# Patient Record
Sex: Male | Born: 1935 | Race: Black or African American | Hispanic: No | Marital: Married | Smoking: Never smoker
Health system: Southern US, Community
[De-identification: ages and names within clinical notes are randomized; demographics above are authoritative.]

## PROBLEM LIST (undated history)

## (undated) DIAGNOSIS — E118 Type 2 diabetes mellitus with unspecified complications: Secondary | ICD-10-CM

## (undated) DIAGNOSIS — R778 Other specified abnormalities of plasma proteins: Secondary | ICD-10-CM

## (undated) DIAGNOSIS — B9620 Unspecified Escherichia coli [E. coli] as the cause of diseases classified elsewhere: Secondary | ICD-10-CM

## (undated) DIAGNOSIS — Z794 Long term (current) use of insulin: Secondary | ICD-10-CM

## (undated) DIAGNOSIS — I10 Essential (primary) hypertension: Secondary | ICD-10-CM

## (undated) DIAGNOSIS — G629 Polyneuropathy, unspecified: Secondary | ICD-10-CM

## (undated) DIAGNOSIS — R531 Weakness: Secondary | ICD-10-CM

## (undated) DIAGNOSIS — F0390 Unspecified dementia without behavioral disturbance: Secondary | ICD-10-CM

## (undated) DIAGNOSIS — N186 End stage renal disease: Secondary | ICD-10-CM

## (undated) DIAGNOSIS — I739 Peripheral vascular disease, unspecified: Secondary | ICD-10-CM

## (undated) DIAGNOSIS — IMO0001 Reserved for inherently not codable concepts without codable children: Secondary | ICD-10-CM

## (undated) DIAGNOSIS — K219 Gastro-esophageal reflux disease without esophagitis: Secondary | ICD-10-CM

## (undated) DIAGNOSIS — R7881 Bacteremia: Secondary | ICD-10-CM

## (undated) DIAGNOSIS — R7989 Other specified abnormal findings of blood chemistry: Secondary | ICD-10-CM

## (undated) DIAGNOSIS — C61 Malignant neoplasm of prostate: Secondary | ICD-10-CM

## (undated) DIAGNOSIS — R569 Unspecified convulsions: Secondary | ICD-10-CM

## (undated) DIAGNOSIS — Z992 Dependence on renal dialysis: Secondary | ICD-10-CM

## (undated) DIAGNOSIS — F32A Depression, unspecified: Secondary | ICD-10-CM

## (undated) DIAGNOSIS — D638 Anemia in other chronic diseases classified elsewhere: Secondary | ICD-10-CM

## (undated) DIAGNOSIS — R296 Repeated falls: Secondary | ICD-10-CM

## (undated) DIAGNOSIS — I639 Cerebral infarction, unspecified: Secondary | ICD-10-CM

## (undated) DIAGNOSIS — G894 Chronic pain syndrome: Secondary | ICD-10-CM

## (undated) DIAGNOSIS — F39 Unspecified mood [affective] disorder: Secondary | ICD-10-CM

## (undated) DIAGNOSIS — F329 Major depressive disorder, single episode, unspecified: Secondary | ICD-10-CM

## (undated) HISTORY — PX: LEG SURGERY: SHX1003

## (undated) HISTORY — PX: INSERTION OF DIALYSIS CATHETER: SHX1324

## (undated) HISTORY — PX: COLON SURGERY: SHX602

## (undated) HISTORY — PX: TOTAL HIP ARTHROPLASTY: SHX124

---

## 2004-03-12 ENCOUNTER — Ambulatory Visit: Payer: Self-pay | Admitting: Radiation Oncology

## 2004-03-15 ENCOUNTER — Ambulatory Visit: Payer: Self-pay | Admitting: Radiation Oncology

## 2004-09-13 ENCOUNTER — Ambulatory Visit: Payer: Self-pay | Admitting: Radiation Oncology

## 2004-10-13 ENCOUNTER — Ambulatory Visit: Payer: Self-pay | Admitting: Radiation Oncology

## 2006-04-09 ENCOUNTER — Ambulatory Visit: Payer: Self-pay

## 2009-06-25 ENCOUNTER — Emergency Department: Payer: Self-pay | Admitting: Emergency Medicine

## 2013-03-16 ENCOUNTER — Inpatient Hospital Stay: Payer: Self-pay | Admitting: Family Medicine

## 2013-03-16 LAB — APTT: Activated PTT: 34 secs (ref 23.6–35.9)

## 2013-03-16 LAB — CBC
HCT: 24.9 % — ABNORMAL LOW (ref 40.0–52.0)
HGB: 8.1 g/dL — ABNORMAL LOW (ref 13.0–18.0)
MCH: 31.2 pg (ref 26.0–34.0)
MCHC: 32.6 g/dL (ref 32.0–36.0)
MCV: 96 fL (ref 80–100)
Platelet: 182 10*3/uL (ref 150–440)
RBC: 2.6 10*6/uL — ABNORMAL LOW (ref 4.40–5.90)
RDW: 13.9 % (ref 11.5–14.5)
WBC: 6.3 10*3/uL (ref 3.8–10.6)

## 2013-03-16 LAB — COMPREHENSIVE METABOLIC PANEL
Alkaline Phosphatase: 89 U/L
BUN: 30 mg/dL — ABNORMAL HIGH (ref 7–18)
Bilirubin,Total: 0.2 mg/dL (ref 0.2–1.0)
Calcium, Total: 8.5 mg/dL (ref 8.5–10.1)
Creatinine: 5.67 mg/dL — ABNORMAL HIGH (ref 0.60–1.30)
EGFR (African American): 10 — ABNORMAL LOW
EGFR (Non-African Amer.): 9 — ABNORMAL LOW
Potassium: 3.8 mmol/L (ref 3.5–5.1)
SGPT (ALT): 10 U/L — ABNORMAL LOW (ref 12–78)
Total Protein: 6.8 g/dL (ref 6.4–8.2)

## 2013-03-16 LAB — HEMATOCRIT
HCT: 25.5 % — ABNORMAL LOW (ref 40.0–52.0)
HCT: 26 % — ABNORMAL LOW (ref 40.0–52.0)

## 2013-03-16 LAB — PROTIME-INR: Prothrombin Time: 14.4 secs (ref 11.5–14.7)

## 2013-03-16 LAB — HEMOGLOBIN: HGB: 8.5 g/dL — ABNORMAL LOW (ref 13.0–18.0)

## 2013-03-17 LAB — BASIC METABOLIC PANEL
Anion Gap: 9 (ref 7–16)
BUN: 46 mg/dL — ABNORMAL HIGH (ref 7–18)
BUN: 15 mg/dL (ref 7–18)
Calcium, Total: 7.8 mg/dL — ABNORMAL LOW (ref 8.5–10.1)
Calcium, Total: 8.3 mg/dL — ABNORMAL LOW (ref 8.5–10.1)
Chloride: 96 mmol/L — ABNORMAL LOW (ref 98–107)
Co2: 28 mmol/L (ref 21–32)
Co2: 31 mmol/L (ref 21–32)
Creatinine: 8.74 mg/dL — ABNORMAL HIGH (ref 0.60–1.30)
Creatinine: 3.8 mg/dL — ABNORMAL HIGH (ref 0.60–1.30)
EGFR (African American): 16 — ABNORMAL LOW
EGFR (Non-African Amer.): 5 — ABNORMAL LOW
Glucose: 151 mg/dL — ABNORMAL HIGH (ref 65–99)
Potassium: 4 mmol/L (ref 3.5–5.1)
Potassium: 3.2 mmol/L — ABNORMAL LOW (ref 3.5–5.1)
Sodium: 130 mmol/L — ABNORMAL LOW (ref 136–145)

## 2013-03-17 LAB — CBC WITH DIFFERENTIAL/PLATELET
Basophil %: 0.5 %
Eosinophil %: 0.4 %
HCT: 24 % — ABNORMAL LOW (ref 40.0–52.0)
Lymphocyte #: 0.3 10*3/uL — ABNORMAL LOW (ref 1.0–3.6)
MCHC: 33 g/dL (ref 32.0–36.0)
MCV: 94 fL (ref 80–100)
Monocyte #: 0.9 x10 3/mm (ref 0.2–1.0)
Monocyte %: 14.5 %
Neutrophil #: 5.2 10*3/uL (ref 1.4–6.5)
Neutrophil %: 79.7 %
Platelet: 183 10*3/uL (ref 150–440)
RBC: 2.56 10*6/uL — ABNORMAL LOW (ref 4.40–5.90)
RDW: 13.6 % (ref 11.5–14.5)
WBC: 6.5 10*3/uL (ref 3.8–10.6)

## 2013-03-17 LAB — HEMOGLOBIN: HGB: 8.7 g/dL — ABNORMAL LOW (ref 13.0–18.0)

## 2013-03-17 LAB — CK-MB: CK-MB: 3.4 ng/mL (ref 0.5–3.6)

## 2013-03-17 LAB — TSH: Thyroid Stimulating Horm: 1.25 u[IU]/mL

## 2013-03-17 LAB — TROPONIN I: Troponin-I: 0.17 ng/mL — ABNORMAL HIGH

## 2013-03-18 LAB — CBC WITH DIFFERENTIAL/PLATELET
Basophil #: 0 10*3/uL (ref 0.0–0.1)
Basophil %: 0.5 %
Eosinophil #: 0 10*3/uL (ref 0.0–0.7)
Eosinophil #: 0 10*3/uL (ref 0.0–0.7)
HCT: 26.1 % — ABNORMAL LOW (ref 40.0–52.0)
HGB: 8.4 g/dL — ABNORMAL LOW (ref 13.0–18.0)
Lymphocyte #: 0.5 10*3/uL — ABNORMAL LOW (ref 1.0–3.6)
Lymphocyte %: 4.5 %
MCHC: 32.8 g/dL (ref 32.0–36.0)
MCV: 94 fL (ref 80–100)
Monocyte %: 15.8 %
Monocyte %: 15.4 %
Neutrophil #: 6 10*3/uL (ref 1.4–6.5)
Neutrophil #: 5.5 10*3/uL (ref 1.4–6.5)
Neutrophil %: 77.4 %
Neutrophil %: 79.1 %
Platelet: 201 10*3/uL (ref 150–440)
Platelet: 183 10*3/uL (ref 150–440)
RBC: 2.77 10*6/uL — ABNORMAL LOW (ref 4.40–5.90)
WBC: 7.8 10*3/uL (ref 3.8–10.6)
WBC: 7 10*3/uL (ref 3.8–10.6)

## 2013-03-18 LAB — PROTIME-INR: Prothrombin Time: 13.9 secs (ref 11.5–14.7)

## 2013-03-18 LAB — APTT: Activated PTT: 33.7 secs (ref 23.6–35.9)

## 2013-03-19 LAB — CBC WITH DIFFERENTIAL/PLATELET
Basophil #: 0 10*3/uL (ref 0.0–0.1)
HCT: 25.6 % — ABNORMAL LOW (ref 40.0–52.0)
HGB: 8.7 g/dL — ABNORMAL LOW (ref 13.0–18.0)
Lymphocyte #: 0.3 10*3/uL — ABNORMAL LOW (ref 1.0–3.6)
Lymphocyte %: 4.5 %
MCH: 31.1 pg (ref 26.0–34.0)
MCH: 30.4 pg (ref 26.0–34.0)
MCHC: 34 g/dL (ref 32.0–36.0)
MCV: 91 fL (ref 80–100)
MCV: 91 fL (ref 80–100)
Monocyte #: 1.1 x10 3/mm — ABNORMAL HIGH (ref 0.2–1.0)
Monocyte #: 0.4 x10 3/mm (ref 0.2–1.0)
Monocyte %: 6.2 %
Platelet: 200 10*3/uL (ref 150–440)
Platelet: 180 10*3/uL (ref 150–440)
RBC: 2.8 10*6/uL — ABNORMAL LOW (ref 4.40–5.90)
RBC: 2.61 10*6/uL — ABNORMAL LOW (ref 4.40–5.90)
RDW: 15.6 % — ABNORMAL HIGH (ref 11.5–14.5)
WBC: 6.6 10*3/uL (ref 3.8–10.6)

## 2013-03-19 LAB — RENAL FUNCTION PANEL
Albumin: 2.4 g/dL — ABNORMAL LOW (ref 3.4–5.0)
Creatinine: 7.28 mg/dL — ABNORMAL HIGH (ref 0.60–1.30)
EGFR (Non-African Amer.): 6 — ABNORMAL LOW
Osmolality: 278 (ref 275–301)
Phosphorus: 5.2 mg/dL — ABNORMAL HIGH (ref 2.5–4.9)

## 2013-03-19 LAB — HEMOGLOBIN A1C: Hemoglobin A1C: 9.2 % — ABNORMAL HIGH (ref 4.2–6.3)

## 2013-03-19 LAB — HEMOGLOBIN: HGB: 8.8 g/dL — ABNORMAL LOW (ref 13.0–18.0)

## 2013-03-20 LAB — CBC WITH DIFFERENTIAL/PLATELET
Basophil %: 0.3 %
Eosinophil #: 0 10*3/uL (ref 0.0–0.7)
HCT: 23.6 % — ABNORMAL LOW (ref 40.0–52.0)
HGB: 7.8 g/dL — ABNORMAL LOW (ref 13.0–18.0)
Lymphocyte #: 0.3 10*3/uL — ABNORMAL LOW (ref 1.0–3.6)
Lymphocyte %: 3.5 %
MCH: 29.7 pg (ref 26.0–34.0)
MCHC: 32.9 g/dL (ref 32.0–36.0)
MCV: 90 fL (ref 80–100)
Monocyte #: 1.1 x10 3/mm — ABNORMAL HIGH (ref 0.2–1.0)
Platelet: 201 10*3/uL (ref 150–440)
RDW: 15.2 % — ABNORMAL HIGH (ref 11.5–14.5)
WBC: 8.2 10*3/uL (ref 3.8–10.6)

## 2013-03-25 LAB — CULTURE, BLOOD (SINGLE)

## 2013-04-25 ENCOUNTER — Ambulatory Visit: Payer: Self-pay | Admitting: Neurology

## 2013-04-25 ENCOUNTER — Inpatient Hospital Stay: Payer: Self-pay | Admitting: Internal Medicine

## 2013-04-25 LAB — URINALYSIS, COMPLETE
BILIRUBIN, UR: NEGATIVE
Glucose,UR: >=500 mg/dL (ref 0–75)
KETONE: NEGATIVE
Nitrite: NEGATIVE
Ph: 6 (ref 4.5–8.0)
RBC,UR: 156 /HPF (ref 0–5)
Specific Gravity: 1.015 (ref 1.003–1.030)

## 2013-04-25 LAB — CBC
HCT: 35.5 % — AB (ref 40.0–52.0)
HGB: 11.3 g/dL — ABNORMAL LOW (ref 13.0–18.0)
MCH: 30.7 pg (ref 26.0–34.0)
MCHC: 31.9 g/dL — ABNORMAL LOW (ref 32.0–36.0)
MCV: 96 fL (ref 80–100)
Platelet: 148 10*3/uL — ABNORMAL LOW (ref 150–440)
RBC: 3.68 10*6/uL — AB (ref 4.40–5.90)
RDW: 16.2 % — ABNORMAL HIGH (ref 11.5–14.5)
WBC: 5.6 10*3/uL (ref 3.8–10.6)

## 2013-04-25 LAB — CK TOTAL AND CKMB (NOT AT ARMC)
CK, Total: 88 U/L (ref 35–232)
CK-MB: 3.9 ng/mL — ABNORMAL HIGH (ref 0.5–3.6)

## 2013-04-25 LAB — COMPREHENSIVE METABOLIC PANEL
ALK PHOS: 174 U/L — AB
AST: 26 U/L (ref 15–37)
Albumin: 3.4 g/dL (ref 3.4–5.0)
Anion Gap: 10 (ref 7–16)
BUN: 45 mg/dL — ABNORMAL HIGH (ref 7–18)
Bilirubin,Total: 0.3 mg/dL (ref 0.2–1.0)
CHLORIDE: 97 mmol/L — AB (ref 98–107)
CREATININE: 7.41 mg/dL — AB (ref 0.60–1.30)
Calcium, Total: 8.1 mg/dL — ABNORMAL LOW (ref 8.5–10.1)
Co2: 27 mmol/L (ref 21–32)
EGFR (African American): 7 — ABNORMAL LOW
GFR CALC NON AF AMER: 6 — AB
Glucose: 564 mg/dL (ref 65–99)
OSMOLALITY: 306 (ref 275–301)
Potassium: 4.3 mmol/L (ref 3.5–5.1)
SGPT (ALT): 27 U/L (ref 12–78)
Sodium: 134 mmol/L — ABNORMAL LOW (ref 136–145)
Total Protein: 7.9 g/dL (ref 6.4–8.2)

## 2013-04-25 LAB — ETHANOL: Ethanol %: <0.003 % (ref 0.000–0.080)

## 2013-04-25 LAB — PROTIME-INR
INR: 0.9
PROTHROMBIN TIME: 12.1 s (ref 11.5–14.7)

## 2013-04-25 LAB — MAGNESIUM: Magnesium: 1.7 mg/dL — ABNORMAL LOW

## 2013-04-25 LAB — TROPONIN I: Troponin-I: 0.07 ng/mL — ABNORMAL HIGH

## 2013-04-25 LAB — PHOSPHORUS: PHOSPHORUS: 1.8 mg/dL — AB (ref 2.5–4.9)

## 2013-04-26 LAB — PHOSPHORUS
Phosphorus: 2.3 mg/dL — ABNORMAL LOW (ref 2.5–4.9)
Phosphorus: 3.4 mg/dL (ref 2.5–4.9)

## 2013-04-26 LAB — COMPREHENSIVE METABOLIC PANEL
ALK PHOS: 78 U/L
Albumin: 2.7 g/dL — ABNORMAL LOW (ref 3.4–5.0)
Anion Gap: 9 (ref 7–16)
BUN: 52 mg/dL — AB (ref 7–18)
Bilirubin,Total: 0.3 mg/dL (ref 0.2–1.0)
CALCIUM: 7.9 mg/dL — AB (ref 8.5–10.1)
Chloride: 101 mmol/L (ref 98–107)
Co2: 29 mmol/L (ref 21–32)
Creatinine: 9.18 mg/dL — ABNORMAL HIGH (ref 0.60–1.30)
EGFR (Non-African Amer.): 5 — ABNORMAL LOW
GFR CALC AF AMER: 6 — AB
Glucose: 146 mg/dL — ABNORMAL HIGH (ref 65–99)
Osmolality: 294 (ref 275–301)
Potassium: 3 mmol/L — ABNORMAL LOW (ref 3.5–5.1)
SGOT(AST): 21 U/L (ref 15–37)
SGPT (ALT): 15 U/L (ref 12–78)
SODIUM: 139 mmol/L (ref 136–145)
TOTAL PROTEIN: 6.5 g/dL (ref 6.4–8.2)

## 2013-04-26 LAB — MAGNESIUM: Magnesium: 1.8 mg/dL

## 2013-04-26 LAB — LIPID PANEL
Cholesterol: 113 mg/dL (ref 0–200)
HDL Cholesterol: 47 mg/dL (ref 40–60)
Ldl Cholesterol, Calc: 45 mg/dL (ref 0–100)
Triglycerides: 107 mg/dL (ref 0–200)
VLDL CHOLESTEROL, CALC: 21 mg/dL (ref 5–40)

## 2013-04-26 LAB — CBC WITH DIFFERENTIAL/PLATELET
Basophil #: 0.1 10*3/uL (ref 0.0–0.1)
Basophil %: 1.2 %
EOS PCT: 0.3 %
Eosinophil #: 0 10*3/uL (ref 0.0–0.7)
HCT: 31 % — AB (ref 40.0–52.0)
HGB: 10.2 g/dL — AB (ref 13.0–18.0)
LYMPHS ABS: 0.6 10*3/uL — AB (ref 1.0–3.6)
LYMPHS PCT: 6.9 %
MCH: 30.8 pg (ref 26.0–34.0)
MCHC: 32.9 g/dL (ref 32.0–36.0)
MCV: 94 fL (ref 80–100)
MONO ABS: 0.8 x10 3/mm (ref 0.2–1.0)
Monocyte %: 9 %
NEUTROS PCT: 82.6 %
Neutrophil #: 7 10*3/uL — ABNORMAL HIGH (ref 1.4–6.5)
Platelet: 151 10*3/uL (ref 150–440)
RBC: 3.31 10*6/uL — ABNORMAL LOW (ref 4.40–5.90)
RDW: 15.8 % — ABNORMAL HIGH (ref 11.5–14.5)
WBC: 8.4 10*3/uL (ref 3.8–10.6)

## 2013-04-26 LAB — TSH: Thyroid Stimulating Horm: 0.512 u[IU]/mL

## 2013-04-27 LAB — BASIC METABOLIC PANEL
Anion Gap: 2 — ABNORMAL LOW (ref 7–16)
BUN: 28 mg/dL — ABNORMAL HIGH (ref 7–18)
CALCIUM: 8.7 mg/dL (ref 8.5–10.1)
Chloride: 99 mmol/L (ref 98–107)
Co2: 36 mmol/L — ABNORMAL HIGH (ref 21–32)
Creatinine: 6.33 mg/dL — ABNORMAL HIGH (ref 0.60–1.30)
EGFR (African American): 9 — ABNORMAL LOW
GFR CALC NON AF AMER: 8 — AB
GLUCOSE: 105 mg/dL — AB (ref 65–99)
Osmolality: 280 (ref 275–301)
Potassium: 3.3 mmol/L — ABNORMAL LOW (ref 3.5–5.1)
SODIUM: 137 mmol/L (ref 136–145)

## 2013-04-27 LAB — CBC WITH DIFFERENTIAL/PLATELET
BASOS PCT: 0.3 %
Basophil #: 0 10*3/uL (ref 0.0–0.1)
Eosinophil #: 0 10*3/uL (ref 0.0–0.7)
Eosinophil %: 0.4 %
HCT: 32.6 % — ABNORMAL LOW (ref 40.0–52.0)
HGB: 10.4 g/dL — AB (ref 13.0–18.0)
LYMPHS PCT: 4.4 %
Lymphocyte #: 0.4 10*3/uL — ABNORMAL LOW (ref 1.0–3.6)
MCH: 30.1 pg (ref 26.0–34.0)
MCHC: 31.9 g/dL — ABNORMAL LOW (ref 32.0–36.0)
MCV: 94 fL (ref 80–100)
Monocyte #: 0.9 x10 3/mm (ref 0.2–1.0)
Monocyte %: 8.9 %
NEUTROS ABS: 8.6 10*3/uL — AB (ref 1.4–6.5)
Neutrophil %: 86 %
PLATELETS: 149 10*3/uL — AB (ref 150–440)
RBC: 3.46 10*6/uL — AB (ref 4.40–5.90)
RDW: 15.5 % — AB (ref 11.5–14.5)
WBC: 10 10*3/uL (ref 3.8–10.6)

## 2013-04-28 LAB — BASIC METABOLIC PANEL
Anion Gap: 7 (ref 7–16)
BUN: 49 mg/dL — AB (ref 7–18)
CALCIUM: 8.2 mg/dL — AB (ref 8.5–10.1)
Chloride: 99 mmol/L (ref 98–107)
Co2: 30 mmol/L (ref 21–32)
Creatinine: 7.96 mg/dL — ABNORMAL HIGH (ref 0.60–1.30)
GFR CALC AF AMER: 7 — AB
GFR CALC NON AF AMER: 6 — AB
Glucose: 229 mg/dL — ABNORMAL HIGH (ref 65–99)
Osmolality: 292 (ref 275–301)
POTASSIUM: 3.5 mmol/L (ref 3.5–5.1)
Sodium: 136 mmol/L (ref 136–145)

## 2013-04-28 LAB — CBC WITH DIFFERENTIAL/PLATELET
BASOS ABS: 0 10*3/uL (ref 0.0–0.1)
Basophil %: 0.4 %
Eosinophil #: 0.1 10*3/uL (ref 0.0–0.7)
Eosinophil %: 1 %
HCT: 30.8 % — ABNORMAL LOW (ref 40.0–52.0)
HGB: 10.2 g/dL — AB (ref 13.0–18.0)
LYMPHS ABS: 0.3 10*3/uL — AB (ref 1.0–3.6)
LYMPHS PCT: 4.3 %
MCH: 30.9 pg (ref 26.0–34.0)
MCHC: 33 g/dL (ref 32.0–36.0)
MCV: 94 fL (ref 80–100)
MONO ABS: 0.7 x10 3/mm (ref 0.2–1.0)
MONOS PCT: 9.7 %
Neutrophil #: 6.5 10*3/uL (ref 1.4–6.5)
Neutrophil %: 84.6 %
PLATELETS: 171 10*3/uL (ref 150–440)
RBC: 3.28 10*6/uL — ABNORMAL LOW (ref 4.40–5.90)
RDW: 15.7 % — ABNORMAL HIGH (ref 11.5–14.5)
WBC: 7.6 10*3/uL (ref 3.8–10.6)

## 2013-04-28 LAB — RENAL FUNCTION PANEL
ANION GAP: 9 (ref 7–16)
Albumin: 2.7 g/dL — ABNORMAL LOW (ref 3.4–5.0)
BUN: 46 mg/dL — AB (ref 7–18)
Calcium, Total: 8.4 mg/dL — ABNORMAL LOW (ref 8.5–10.1)
Chloride: 102 mmol/L (ref 98–107)
Co2: 30 mmol/L (ref 21–32)
Creatinine: 8.37 mg/dL — ABNORMAL HIGH (ref 0.60–1.30)
EGFR (African American): 6 — ABNORMAL LOW
GFR CALC NON AF AMER: 5 — AB
Glucose: 188 mg/dL — ABNORMAL HIGH (ref 65–99)
Osmolality: 298 (ref 275–301)
PHOSPHORUS: 2.4 mg/dL — AB (ref 2.5–4.9)
POTASSIUM: 3.5 mmol/L (ref 3.5–5.1)
Sodium: 141 mmol/L (ref 136–145)

## 2013-04-30 LAB — RENAL FUNCTION PANEL
Albumin: 2.6 g/dL — ABNORMAL LOW
Anion Gap: 8
BUN: 46 mg/dL — ABNORMAL HIGH
Calcium, Total: 8.5 mg/dL
Chloride: 98 mmol/L
Co2: 31 mmol/L
Creatinine: 8.57 mg/dL — ABNORMAL HIGH
EGFR (African American): 6 — ABNORMAL LOW
EGFR (Non-African Amer.): 5 — ABNORMAL LOW
Glucose: 154 mg/dL — ABNORMAL HIGH
Osmolality: 289
Phosphorus: 2.8 mg/dL
Potassium: 3.8 mmol/L
Sodium: 137 mmol/L

## 2013-05-01 ENCOUNTER — Inpatient Hospital Stay: Payer: Self-pay | Admitting: Internal Medicine

## 2013-05-01 LAB — COMPREHENSIVE METABOLIC PANEL
ALBUMIN: 2.7 g/dL — AB (ref 3.4–5.0)
ALK PHOS: 76 U/L
ALT: 18 U/L (ref 12–78)
Anion Gap: 4 — ABNORMAL LOW (ref 7–16)
BUN: 31 mg/dL — ABNORMAL HIGH (ref 7–18)
Bilirubin,Total: 0.2 mg/dL (ref 0.2–1.0)
CREATININE: 5.97 mg/dL — AB (ref 0.60–1.30)
Calcium, Total: 8.5 mg/dL (ref 8.5–10.1)
Chloride: 98 mmol/L (ref 98–107)
Co2: 33 mmol/L — ABNORMAL HIGH (ref 21–32)
EGFR (African American): 9 — ABNORMAL LOW
GFR CALC NON AF AMER: 8 — AB
Glucose: 269 mg/dL — ABNORMAL HIGH (ref 65–99)
Osmolality: 286 (ref 275–301)
Potassium: 3.9 mmol/L (ref 3.5–5.1)
SGOT(AST): 37 U/L (ref 15–37)
Sodium: 135 mmol/L — ABNORMAL LOW (ref 136–145)
Total Protein: 6.5 g/dL (ref 6.4–8.2)

## 2013-05-01 LAB — CBC WITH DIFFERENTIAL/PLATELET
Basophil #: 0 10*3/uL (ref 0.0–0.1)
Basophil %: 0.3 %
EOS PCT: 1 %
Eosinophil #: 0.1 10*3/uL (ref 0.0–0.7)
HCT: 29 % — ABNORMAL LOW (ref 40.0–52.0)
HGB: 9.3 g/dL — ABNORMAL LOW (ref 13.0–18.0)
Lymphocyte #: 0.6 10*3/uL — ABNORMAL LOW (ref 1.0–3.6)
Lymphocyte %: 7.5 %
MCH: 30 pg (ref 26.0–34.0)
MCHC: 32.3 g/dL (ref 32.0–36.0)
MCV: 93 fL (ref 80–100)
MONO ABS: 0.9 x10 3/mm (ref 0.2–1.0)
MONOS PCT: 11.7 %
Neutrophil #: 6 10*3/uL (ref 1.4–6.5)
Neutrophil %: 79.5 %
PLATELETS: 175 10*3/uL (ref 150–440)
RBC: 3.11 10*6/uL — AB (ref 4.40–5.90)
RDW: 15.6 % — AB (ref 11.5–14.5)
WBC: 7.5 10*3/uL (ref 3.8–10.6)

## 2013-05-01 LAB — HEMOGLOBIN
HGB: 9.3 g/dL — AB (ref 13.0–18.0)
HGB: 8.9 g/dL — AB (ref 13.0–18.0)

## 2013-05-02 LAB — BASIC METABOLIC PANEL
Anion Gap: 5 — ABNORMAL LOW (ref 7–16)
BUN: 40 mg/dL — AB (ref 7–18)
CHLORIDE: 96 mmol/L — AB (ref 98–107)
Calcium, Total: 9.1 mg/dL (ref 8.5–10.1)
Co2: 32 mmol/L (ref 21–32)
Creatinine: 8.05 mg/dL — ABNORMAL HIGH (ref 0.60–1.30)
EGFR (Non-African Amer.): 6 — ABNORMAL LOW
GFR CALC AF AMER: 7 — AB
Glucose: 138 mg/dL — ABNORMAL HIGH (ref 65–99)
Osmolality: 278 (ref 275–301)
Potassium: 4 mmol/L (ref 3.5–5.1)
Sodium: 133 mmol/L — ABNORMAL LOW (ref 136–145)

## 2013-05-02 LAB — CBC WITH DIFFERENTIAL/PLATELET
BASOS ABS: 0 10*3/uL (ref 0.0–0.1)
BASOS PCT: 0.7 %
EOS PCT: 2.3 %
Eosinophil #: 0.1 10*3/uL (ref 0.0–0.7)
HCT: 27.5 % — AB (ref 40.0–52.0)
HGB: 8.8 g/dL — AB (ref 13.0–18.0)
Lymphocyte #: 0.6 10*3/uL — ABNORMAL LOW (ref 1.0–3.6)
Lymphocyte %: 9.3 %
MCH: 29.4 pg (ref 26.0–34.0)
MCHC: 32 g/dL (ref 32.0–36.0)
MCV: 92 fL (ref 80–100)
Monocyte #: 0.6 x10 3/mm (ref 0.2–1.0)
Monocyte %: 10 %
Neutrophil #: 4.7 10*3/uL (ref 1.4–6.5)
Neutrophil %: 77.7 %
PLATELETS: 197 10*3/uL (ref 150–440)
RBC: 2.99 10*6/uL — ABNORMAL LOW (ref 4.40–5.90)
RDW: 15.5 % — ABNORMAL HIGH (ref 11.5–14.5)
WBC: 6 10*3/uL (ref 3.8–10.6)

## 2013-05-03 LAB — CBC WITH DIFFERENTIAL/PLATELET
BASOS PCT: 0.5 %
Basophil #: 0 10*3/uL (ref 0.0–0.1)
Basophil #: 0 10*3/uL (ref 0.0–0.1)
Basophil %: 0.7 %
EOS ABS: 0.1 10*3/uL (ref 0.0–0.7)
Eosinophil #: 0.1 10*3/uL (ref 0.0–0.7)
Eosinophil %: 1.9 %
Eosinophil %: 1.3 %
HCT: 27.3 % — ABNORMAL LOW (ref 40.0–52.0)
HCT: 28.2 % — AB (ref 40.0–52.0)
HGB: 8.7 g/dL — AB (ref 13.0–18.0)
HGB: 9 g/dL — AB (ref 13.0–18.0)
LYMPHS PCT: 8.1 %
Lymphocyte #: 0.5 10*3/uL — ABNORMAL LOW (ref 1.0–3.6)
Lymphocyte #: 0.4 10*3/uL — ABNORMAL LOW (ref 1.0–3.6)
Lymphocyte %: 6.1 %
MCH: 29.8 pg (ref 26.0–34.0)
MCH: 29.6 pg (ref 26.0–34.0)
MCHC: 31.9 g/dL — AB (ref 32.0–36.0)
MCHC: 31.9 g/dL — ABNORMAL LOW (ref 32.0–36.0)
MCV: 94 fL (ref 80–100)
MCV: 93 fL (ref 80–100)
MONO ABS: 0.7 x10 3/mm (ref 0.2–1.0)
MONO ABS: 0.8 x10 3/mm (ref 0.2–1.0)
Monocyte %: 11.5 %
Monocyte %: 11.5 %
NEUTROS ABS: 4.7 10*3/uL (ref 1.4–6.5)
Neutrophil #: 5.4 10*3/uL (ref 1.4–6.5)
Neutrophil %: 77.8 %
Neutrophil %: 80.6 %
PLATELETS: 231 10*3/uL (ref 150–440)
Platelet: 217 10*3/uL (ref 150–440)
RBC: 2.92 10*6/uL — ABNORMAL LOW (ref 4.40–5.90)
RBC: 3.04 10*6/uL — ABNORMAL LOW (ref 4.40–5.90)
RDW: 15.5 % — ABNORMAL HIGH (ref 11.5–14.5)
RDW: 15.3 % — ABNORMAL HIGH (ref 11.5–14.5)
WBC: 6.1 10*3/uL (ref 3.8–10.6)
WBC: 6.7 10*3/uL (ref 3.8–10.6)

## 2013-05-03 LAB — RENAL FUNCTION PANEL
ALBUMIN: 2.6 g/dL — AB (ref 3.4–5.0)
Anion Gap: 9 (ref 7–16)
BUN: 50 mg/dL — ABNORMAL HIGH (ref 7–18)
CHLORIDE: 95 mmol/L — AB (ref 98–107)
CREATININE: 9.68 mg/dL — AB (ref 0.60–1.30)
Calcium, Total: 9 mg/dL (ref 8.5–10.1)
Co2: 28 mmol/L (ref 21–32)
EGFR (African American): 5 — ABNORMAL LOW
EGFR (Non-African Amer.): 5 — ABNORMAL LOW
Glucose: 95 mg/dL (ref 65–99)
Osmolality: 278 (ref 275–301)
POTASSIUM: 4.3 mmol/L (ref 3.5–5.1)
Phosphorus: 4.8 mg/dL (ref 2.5–4.9)
Sodium: 132 mmol/L — ABNORMAL LOW (ref 136–145)

## 2013-05-04 LAB — CBC WITH DIFFERENTIAL/PLATELET
BASOS ABS: 0 10*3/uL (ref 0.0–0.1)
BASOS PCT: 0.5 %
Eosinophil #: 0 10*3/uL (ref 0.0–0.7)
Eosinophil %: 0.7 %
HCT: 26.4 % — ABNORMAL LOW (ref 40.0–52.0)
HGB: 8.5 g/dL — AB (ref 13.0–18.0)
Lymphocyte #: 0.3 10*3/uL — ABNORMAL LOW (ref 1.0–3.6)
Lymphocyte %: 5.6 %
MCH: 30.1 pg (ref 26.0–34.0)
MCHC: 32.2 g/dL (ref 32.0–36.0)
MCV: 93 fL (ref 80–100)
MONO ABS: 0.6 x10 3/mm (ref 0.2–1.0)
MONOS PCT: 11.2 %
NEUTROS ABS: 4.5 10*3/uL (ref 1.4–6.5)
Neutrophil %: 82 %
PLATELETS: 220 10*3/uL (ref 150–440)
RBC: 2.83 10*6/uL — AB (ref 4.40–5.90)
RDW: 15.1 % — AB (ref 11.5–14.5)
WBC: 5.6 10*3/uL (ref 3.8–10.6)

## 2013-05-04 LAB — HEMOGLOBIN: HGB: 8.1 g/dL — ABNORMAL LOW (ref 13.0–18.0)

## 2013-05-05 LAB — CBC WITH DIFFERENTIAL/PLATELET
BASOS ABS: 0 10*3/uL (ref 0.0–0.1)
BASOS PCT: 0.9 %
EOS PCT: 1.3 %
Eosinophil #: 0.1 10*3/uL (ref 0.0–0.7)
HCT: 27.1 % — ABNORMAL LOW (ref 40.0–52.0)
HGB: 8.5 g/dL — ABNORMAL LOW (ref 13.0–18.0)
LYMPHS PCT: 9.9 %
Lymphocyte #: 0.5 10*3/uL — ABNORMAL LOW (ref 1.0–3.6)
MCH: 29.3 pg (ref 26.0–34.0)
MCHC: 31.5 g/dL — ABNORMAL LOW (ref 32.0–36.0)
MCV: 93 fL (ref 80–100)
MONO ABS: 0.6 x10 3/mm (ref 0.2–1.0)
Monocyte %: 11.7 %
NEUTROS ABS: 4.1 10*3/uL (ref 1.4–6.5)
Neutrophil %: 76.2 %
PLATELETS: 235 10*3/uL (ref 150–440)
RBC: 2.9 10*6/uL — AB (ref 4.40–5.90)
RDW: 15.6 % — ABNORMAL HIGH (ref 11.5–14.5)
WBC: 5.3 10*3/uL (ref 3.8–10.6)

## 2013-05-05 LAB — BASIC METABOLIC PANEL
ANION GAP: 8 (ref 7–16)
BUN: 32 mg/dL — AB (ref 7–18)
CO2: 32 mmol/L (ref 21–32)
Calcium, Total: 7.9 mg/dL — ABNORMAL LOW (ref 8.5–10.1)
Chloride: 95 mmol/L — ABNORMAL LOW (ref 98–107)
Creatinine: 8.44 mg/dL — ABNORMAL HIGH (ref 0.60–1.30)
EGFR (African American): 6 — ABNORMAL LOW
GFR CALC NON AF AMER: 5 — AB
Glucose: 105 mg/dL — ABNORMAL HIGH (ref 65–99)
Osmolality: 277 (ref 275–301)
Potassium: 4.2 mmol/L (ref 3.5–5.1)
Sodium: 135 mmol/L — ABNORMAL LOW (ref 136–145)

## 2013-05-05 LAB — PHOSPHORUS: Phosphorus: 4 mg/dL (ref 2.5–4.9)

## 2013-06-08 ENCOUNTER — Inpatient Hospital Stay: Payer: Self-pay | Admitting: Internal Medicine

## 2013-06-08 LAB — PROTIME-INR
INR: 1.1
PROTHROMBIN TIME: 13.8 s (ref 11.5–14.7)

## 2013-06-08 LAB — CBC
HCT: 30.4 % — ABNORMAL LOW (ref 40.0–52.0)
HGB: 9.8 g/dL — ABNORMAL LOW (ref 13.0–18.0)
MCH: 29.9 pg (ref 26.0–34.0)
MCHC: 32.1 g/dL (ref 32.0–36.0)
MCV: 93 fL (ref 80–100)
Platelet: 194 10*3/uL (ref 150–440)
RBC: 3.26 10*6/uL — AB (ref 4.40–5.90)
RDW: 17.3 % — ABNORMAL HIGH (ref 11.5–14.5)
WBC: 7.2 10*3/uL (ref 3.8–10.6)

## 2013-06-08 LAB — COMPREHENSIVE METABOLIC PANEL
ALBUMIN: 2.9 g/dL — AB (ref 3.4–5.0)
ALK PHOS: 88 U/L
Anion Gap: 6 — ABNORMAL LOW (ref 7–16)
BUN: 22 mg/dL — ABNORMAL HIGH (ref 7–18)
Bilirubin,Total: 0.3 mg/dL (ref 0.2–1.0)
CO2: 32 mmol/L (ref 21–32)
Calcium, Total: 8 mg/dL — ABNORMAL LOW (ref 8.5–10.1)
Chloride: 99 mmol/L (ref 98–107)
Creatinine: 5.61 mg/dL — ABNORMAL HIGH (ref 0.60–1.30)
EGFR (African American): 10 — ABNORMAL LOW
GFR CALC NON AF AMER: 9 — AB
Glucose: 52 mg/dL — ABNORMAL LOW (ref 65–99)
Osmolality: 275 (ref 275–301)
Potassium: 3.4 mmol/L — ABNORMAL LOW (ref 3.5–5.1)
SGOT(AST): 15 U/L (ref 15–37)
SGPT (ALT): 11 U/L — ABNORMAL LOW (ref 12–78)
Sodium: 137 mmol/L (ref 136–145)
TOTAL PROTEIN: 7 g/dL (ref 6.4–8.2)

## 2013-06-08 LAB — HEMOGLOBIN: HGB: 8.8 g/dL — AB (ref 13.0–18.0)

## 2013-06-08 LAB — LIPASE, BLOOD: LIPASE: 199 U/L (ref 73–393)

## 2013-06-09 LAB — CBC WITH DIFFERENTIAL/PLATELET
BASOS ABS: 0 10*3/uL (ref 0.0–0.1)
BASOS PCT: 0.8 %
EOS ABS: 0.1 10*3/uL (ref 0.0–0.7)
Eosinophil %: 1.9 %
HCT: 27.3 % — ABNORMAL LOW (ref 40.0–52.0)
HGB: 8.9 g/dL — ABNORMAL LOW (ref 13.0–18.0)
LYMPHS ABS: 0.4 10*3/uL — AB (ref 1.0–3.6)
Lymphocyte %: 7.9 %
MCH: 30 pg (ref 26.0–34.0)
MCHC: 32.6 g/dL (ref 32.0–36.0)
MCV: 92 fL (ref 80–100)
MONOS PCT: 11 %
Monocyte #: 0.5 x10 3/mm (ref 0.2–1.0)
NEUTROS ABS: 3.5 10*3/uL (ref 1.4–6.5)
Neutrophil %: 78.4 %
PLATELETS: 186 10*3/uL (ref 150–440)
RBC: 2.96 10*6/uL — AB (ref 4.40–5.90)
RDW: 17.5 % — ABNORMAL HIGH (ref 11.5–14.5)
WBC: 4.5 10*3/uL (ref 3.8–10.6)

## 2013-06-09 LAB — PHOSPHORUS: Phosphorus: 4.5 mg/dL (ref 2.5–4.9)

## 2013-06-10 LAB — CBC WITH DIFFERENTIAL/PLATELET
BASOS ABS: 0 10*3/uL (ref 0.0–0.1)
Basophil %: 0.7 %
Eosinophil #: 0 10*3/uL (ref 0.0–0.7)
Eosinophil %: 0.6 %
HCT: 27.9 % — ABNORMAL LOW (ref 40.0–52.0)
HGB: 9.1 g/dL — ABNORMAL LOW (ref 13.0–18.0)
Lymphocyte #: 0.6 10*3/uL — ABNORMAL LOW (ref 1.0–3.6)
Lymphocyte %: 11.7 %
MCH: 30.1 pg (ref 26.0–34.0)
MCHC: 32.7 g/dL (ref 32.0–36.0)
MCV: 92 fL (ref 80–100)
MONO ABS: 0.6 x10 3/mm (ref 0.2–1.0)
MONOS PCT: 11.6 %
Neutrophil #: 3.7 10*3/uL (ref 1.4–6.5)
Neutrophil %: 75.4 %
PLATELETS: 191 10*3/uL (ref 150–440)
RBC: 3.03 10*6/uL — AB (ref 4.40–5.90)
RDW: 16.7 % — AB (ref 11.5–14.5)
WBC: 4.9 10*3/uL (ref 3.8–10.6)

## 2013-06-10 LAB — CLOSTRIDIUM DIFFICILE(ARMC)

## 2013-07-04 ENCOUNTER — Inpatient Hospital Stay: Payer: Self-pay | Admitting: Internal Medicine

## 2013-07-04 LAB — CBC
HCT: 32.2 % — AB (ref 40.0–52.0)
HGB: 10.3 g/dL — ABNORMAL LOW (ref 13.0–18.0)
MCH: 29.4 pg (ref 26.0–34.0)
MCHC: 31.9 g/dL — ABNORMAL LOW (ref 32.0–36.0)
MCV: 92 fL (ref 80–100)
Platelet: 181 10*3/uL (ref 150–440)
RBC: 3.51 10*6/uL — AB (ref 4.40–5.90)
RDW: 16.6 % — AB (ref 11.5–14.5)
WBC: 6 10*3/uL (ref 3.8–10.6)

## 2013-07-04 LAB — COMPREHENSIVE METABOLIC PANEL
ALK PHOS: 124 U/L — AB
AST: 35 U/L (ref 15–37)
Albumin: 3.4 g/dL (ref 3.4–5.0)
Anion Gap: 7 (ref 7–16)
BUN: 52 mg/dL — ABNORMAL HIGH (ref 7–18)
Bilirubin,Total: 0.3 mg/dL (ref 0.2–1.0)
CALCIUM: 7.9 mg/dL — AB (ref 8.5–10.1)
CHLORIDE: 96 mmol/L — AB (ref 98–107)
Co2: 28 mmol/L (ref 21–32)
Creatinine: 8.48 mg/dL — ABNORMAL HIGH (ref 0.60–1.30)
EGFR (African American): 6 — ABNORMAL LOW
EGFR (Non-African Amer.): 5 — ABNORMAL LOW
GLUCOSE: 175 mg/dL — AB (ref 65–99)
Osmolality: 281 (ref 275–301)
Potassium: 4.9 mmol/L (ref 3.5–5.1)
SGPT (ALT): 33 U/L (ref 12–78)
SODIUM: 131 mmol/L — AB (ref 136–145)
Total Protein: 7.4 g/dL (ref 6.4–8.2)

## 2013-07-05 LAB — CBC WITH DIFFERENTIAL/PLATELET
Basophil #: 0.1 10*3/uL (ref 0.0–0.1)
Basophil %: 1 %
EOS PCT: 1 %
Eosinophil #: 0.1 10*3/uL (ref 0.0–0.7)
HCT: 30.4 % — AB (ref 40.0–52.0)
HGB: 9.6 g/dL — ABNORMAL LOW (ref 13.0–18.0)
Lymphocyte #: 0.6 10*3/uL — ABNORMAL LOW (ref 1.0–3.6)
Lymphocyte %: 10.5 %
MCH: 28.9 pg (ref 26.0–34.0)
MCHC: 31.6 g/dL — ABNORMAL LOW (ref 32.0–36.0)
MCV: 92 fL (ref 80–100)
Monocyte #: 0.6 x10 3/mm (ref 0.2–1.0)
Monocyte %: 10.7 %
Neutrophil #: 4.4 10*3/uL (ref 1.4–6.5)
Neutrophil %: 76.8 %
Platelet: 171 10*3/uL (ref 150–440)
RBC: 3.32 10*6/uL — ABNORMAL LOW (ref 4.40–5.90)
RDW: 17.1 % — ABNORMAL HIGH (ref 11.5–14.5)
WBC: 5.7 10*3/uL (ref 3.8–10.6)

## 2013-07-05 LAB — PHOSPHORUS: PHOSPHORUS: 3.7 mg/dL (ref 2.5–4.9)

## 2013-07-06 LAB — POTASSIUM: POTASSIUM: 4.3 mmol/L (ref 3.5–5.1)

## 2013-07-06 LAB — HEMOGLOBIN: HGB: 9.7 g/dL — AB (ref 13.0–18.0)

## 2013-07-07 LAB — HEMOGLOBIN: HGB: 10.1 g/dL — ABNORMAL LOW (ref 13.0–18.0)

## 2013-07-08 LAB — PROTIME-INR
INR: 1
PROTHROMBIN TIME: 13.4 s (ref 11.5–14.7)

## 2013-07-08 LAB — HEMOGLOBIN: HGB: 10 g/dL — ABNORMAL LOW (ref 13.0–18.0)

## 2013-07-09 LAB — CBC WITH DIFFERENTIAL/PLATELET
Basophil #: 0 10*3/uL (ref 0.0–0.1)
Basophil %: 0.7 %
EOS PCT: 2.8 %
Eosinophil #: 0.2 10*3/uL (ref 0.0–0.7)
HCT: 33.2 % — AB (ref 40.0–52.0)
HGB: 10.7 g/dL — ABNORMAL LOW (ref 13.0–18.0)
LYMPHS ABS: 0.6 10*3/uL — AB (ref 1.0–3.6)
Lymphocyte %: 11.1 %
MCH: 29.3 pg (ref 26.0–34.0)
MCHC: 32.1 g/dL (ref 32.0–36.0)
MCV: 91 fL (ref 80–100)
Monocyte #: 0.7 x10 3/mm (ref 0.2–1.0)
Monocyte %: 11.7 %
NEUTROS ABS: 4.2 10*3/uL (ref 1.4–6.5)
NEUTROS PCT: 73.7 %
Platelet: 189 10*3/uL (ref 150–440)
RBC: 3.64 10*6/uL — AB (ref 4.40–5.90)
RDW: 16.5 % — AB (ref 11.5–14.5)
WBC: 5.6 10*3/uL (ref 3.8–10.6)

## 2013-07-09 LAB — BASIC METABOLIC PANEL
ANION GAP: 9 (ref 7–16)
BUN: 31 mg/dL — ABNORMAL HIGH (ref 7–18)
CHLORIDE: 98 mmol/L (ref 98–107)
Calcium, Total: 9 mg/dL (ref 8.5–10.1)
Co2: 30 mmol/L (ref 21–32)
Creatinine: 8.32 mg/dL — ABNORMAL HIGH (ref 0.60–1.30)
EGFR (Non-African Amer.): 5 — ABNORMAL LOW
GFR CALC AF AMER: 6 — AB
GLUCOSE: 60 mg/dL — AB (ref 65–99)
Osmolality: 278 (ref 275–301)
Potassium: 4.8 mmol/L (ref 3.5–5.1)
Sodium: 137 mmol/L (ref 136–145)

## 2013-07-09 LAB — HEMOGLOBIN: HGB: 10.3 g/dL — ABNORMAL LOW (ref 13.0–18.0)

## 2013-07-09 LAB — PHOSPHORUS: Phosphorus: 4.9 mg/dL (ref 2.5–4.9)

## 2013-07-10 ENCOUNTER — Ambulatory Visit (HOSPITAL_COMMUNITY)
Admission: AD | Admit: 2013-07-10 | Discharge: 2013-07-10 | Disposition: A | Discharge status: Home / Self Care | Payer: Medicare Other | Source: Other Acute Inpatient Hospital | Attending: Internal Medicine | Admitting: Internal Medicine

## 2013-07-10 DIAGNOSIS — N289 Disorder of kidney and ureter, unspecified: Secondary | ICD-10-CM | POA: Insufficient documentation

## 2013-07-10 LAB — CBC WITH DIFFERENTIAL/PLATELET
BASOS ABS: 0 10*3/uL (ref 0.0–0.1)
BASOS PCT: 0.8 %
EOS ABS: 0.1 10*3/uL (ref 0.0–0.7)
EOS PCT: 1.8 %
HCT: 30.8 % — AB (ref 40.0–52.0)
HGB: 10.1 g/dL — AB (ref 13.0–18.0)
LYMPHS ABS: 0.2 10*3/uL — AB (ref 1.0–3.6)
Lymphocyte %: 4.3 %
MCH: 29.5 pg (ref 26.0–34.0)
MCHC: 32.7 g/dL (ref 32.0–36.0)
MCV: 90 fL (ref 80–100)
MONO ABS: 0.6 x10 3/mm (ref 0.2–1.0)
MONOS PCT: 10.7 %
Neutrophil #: 4.7 10*3/uL (ref 1.4–6.5)
Neutrophil %: 82.4 %
PLATELETS: 176 10*3/uL (ref 150–440)
RBC: 3.41 10*6/uL — AB (ref 4.40–5.90)
RDW: 16.8 % — ABNORMAL HIGH (ref 11.5–14.5)
WBC: 5.7 10*3/uL (ref 3.8–10.6)

## 2013-07-11 ENCOUNTER — Other Ambulatory Visit (HOSPITAL_COMMUNITY): Payer: Medicare Other

## 2013-07-11 ENCOUNTER — Inpatient Hospital Stay
Admission: EM | Admit: 2013-07-11 | Discharge: 2013-08-02 | Disposition: A | Discharge status: Skilled Nursing Facility | Payer: Medicare Other | Source: Other Acute Inpatient Hospital | Attending: Internal Medicine | Admitting: Internal Medicine

## 2013-07-11 LAB — COMPREHENSIVE METABOLIC PANEL
ALBUMIN: 3.3 g/dL — AB (ref 3.5–5.2)
ALK PHOS: 96 U/L (ref 39–117)
ALT: 10 U/L (ref 0–53)
AST: 16 U/L (ref 0–37)
BUN: 24 mg/dL — ABNORMAL HIGH (ref 6–23)
CO2: 30 mEq/L (ref 19–32)
CREATININE: 7.54 mg/dL — AB (ref 0.50–1.35)
Calcium: 9.2 mg/dL (ref 8.4–10.5)
Chloride: 89 mEq/L — ABNORMAL LOW (ref 96–112)
GFR calc Af Amer: 7 mL/min — ABNORMAL LOW (ref >90)
GFR, EST NON AFRICAN AMERICAN: 6 mL/min — AB (ref >90)
Glucose, Bld: 139 mg/dL — ABNORMAL HIGH (ref 70–99)
Potassium: 5.2 mEq/L (ref 3.7–5.3)
Sodium: 133 mEq/L — ABNORMAL LOW (ref 137–147)
Total Bilirubin: 0.3 mg/dL (ref 0.3–1.2)
Total Protein: 7.1 g/dL (ref 6.0–8.3)

## 2013-07-11 LAB — APTT: aPTT: 32 seconds (ref 24–37)

## 2013-07-11 LAB — PHOSPHORUS: PHOSPHORUS: 3.5 mg/dL (ref 2.3–4.6)

## 2013-07-11 LAB — PRO B NATRIURETIC PEPTIDE: PRO B NATRI PEPTIDE: 13312 pg/mL — AB (ref 0–450)

## 2013-07-11 LAB — PROTIME-INR
INR: 1.07 (ref 0.00–1.49)
Prothrombin Time: 13.7 seconds (ref 11.6–15.2)

## 2013-07-11 LAB — LIPID PANEL
CHOL/HDL RATIO: 2.4 ratio
Cholesterol: 111 mg/dL (ref 0–200)
HDL: 47 mg/dL (ref >39)
LDL CALC: 50 mg/dL (ref 0–99)
TRIGLYCERIDES: 70 mg/dL (ref <150)
VLDL: 14 mg/dL (ref 0–40)

## 2013-07-11 LAB — PROCALCITONIN: PROCALCITONIN: 0.53 ng/mL

## 2013-07-11 LAB — MAGNESIUM: MAGNESIUM: 1.7 mg/dL (ref 1.5–2.5)

## 2013-07-12 LAB — CBC WITH DIFFERENTIAL/PLATELET
Basophils Absolute: 0 10*3/uL (ref 0.0–0.1)
Basophils Relative: 0 % (ref 0–1)
EOS ABS: 0.1 10*3/uL (ref 0.0–0.7)
Eosinophils Relative: 2 % (ref 0–5)
HCT: 28.2 % — ABNORMAL LOW (ref 39.0–52.0)
Hemoglobin: 9.2 g/dL — ABNORMAL LOW (ref 13.0–17.0)
LYMPHS ABS: 0.5 10*3/uL — AB (ref 0.7–4.0)
Lymphocytes Relative: 7 % — ABNORMAL LOW (ref 12–46)
MCH: 28.8 pg (ref 26.0–34.0)
MCHC: 32.6 g/dL (ref 30.0–36.0)
MCV: 88.1 fL (ref 78.0–100.0)
Monocytes Absolute: 0.8 10*3/uL (ref 0.1–1.0)
Monocytes Relative: 11 % (ref 3–12)
NEUTROS ABS: 5.8 10*3/uL (ref 1.7–7.7)
NEUTROS PCT: 80 % — AB (ref 43–77)
PLATELETS: 184 10*3/uL (ref 150–400)
RBC: 3.2 MIL/uL — AB (ref 4.22–5.81)
RDW: 15.2 % (ref 11.5–15.5)
WBC: 7.2 10*3/uL (ref 4.0–10.5)

## 2013-07-12 LAB — PATHOLOGY REPORT

## 2013-07-12 LAB — HEMOGLOBIN A1C
Hgb A1c MFr Bld: 6 % — ABNORMAL HIGH (ref <5.7)
Mean Plasma Glucose: 126 mg/dL — ABNORMAL HIGH (ref <117)

## 2013-07-12 LAB — FERRITIN: Ferritin: 376 ng/mL — ABNORMAL HIGH (ref 22–322)

## 2013-07-12 LAB — PREALBUMIN: Prealbumin: 19.9 mg/dL (ref 17.0–34.0)

## 2013-07-12 LAB — FOLATE: FOLATE: 8.6 ng/mL

## 2013-07-12 LAB — TSH: TSH: 2.879 u[IU]/mL (ref 0.350–4.500)

## 2013-07-12 LAB — T4, FREE: FREE T4: 1.37 ng/dL (ref 0.80–1.80)

## 2013-07-12 LAB — VITAMIN B12: Vitamin B-12: 441 pg/mL (ref 211–911)

## 2013-07-13 LAB — CBC
HCT: 28.8 % — ABNORMAL LOW (ref 39.0–52.0)
HEMOGLOBIN: 9.1 g/dL — AB (ref 13.0–17.0)
MCH: 28 pg (ref 26.0–34.0)
MCHC: 31.6 g/dL (ref 30.0–36.0)
MCV: 88.6 fL (ref 78.0–100.0)
PLATELETS: 177 10*3/uL (ref 150–400)
RBC: 3.25 MIL/uL — AB (ref 4.22–5.81)
RDW: 15.1 % (ref 11.5–15.5)
WBC: 4.7 10*3/uL (ref 4.0–10.5)

## 2013-07-13 LAB — BASIC METABOLIC PANEL
BUN: 18 mg/dL (ref 6–23)
CHLORIDE: 96 meq/L (ref 96–112)
CO2: 28 mEq/L (ref 19–32)
Calcium: 8.6 mg/dL (ref 8.4–10.5)
Creatinine, Ser: 5.62 mg/dL — ABNORMAL HIGH (ref 0.50–1.35)
GFR calc Af Amer: 10 mL/min — ABNORMAL LOW (ref >90)
GFR calc non Af Amer: 8 mL/min — ABNORMAL LOW (ref >90)
Glucose, Bld: 125 mg/dL — ABNORMAL HIGH (ref 70–99)
Potassium: 4.6 mEq/L (ref 3.7–5.3)
Sodium: 136 mEq/L — ABNORMAL LOW (ref 137–147)

## 2013-07-14 ENCOUNTER — Institutional Professional Consult (permissible substitution) (HOSPITAL_COMMUNITY): Payer: Medicare Other

## 2013-07-14 LAB — RENAL FUNCTION PANEL
ALBUMIN: 2.9 g/dL — AB (ref 3.5–5.2)
BUN: 33 mg/dL — ABNORMAL HIGH (ref 6–23)
CO2: 23 mEq/L (ref 19–32)
Calcium: 9 mg/dL (ref 8.4–10.5)
Chloride: 95 mEq/L — ABNORMAL LOW (ref 96–112)
Creatinine, Ser: 7.04 mg/dL — ABNORMAL HIGH (ref 0.50–1.35)
GFR calc Af Amer: 7 mL/min — ABNORMAL LOW (ref >90)
GFR calc non Af Amer: 6 mL/min — ABNORMAL LOW (ref >90)
Glucose, Bld: 124 mg/dL — ABNORMAL HIGH (ref 70–99)
PHOSPHORUS: 4 mg/dL (ref 2.3–4.6)
Potassium: 5.2 mEq/L (ref 3.7–5.3)
Sodium: 134 mEq/L — ABNORMAL LOW (ref 137–147)

## 2013-07-14 LAB — CBC WITH DIFFERENTIAL/PLATELET
Basophils Absolute: 0 10*3/uL (ref 0.0–0.1)
Basophils Relative: 0 % (ref 0–1)
Eosinophils Absolute: 0.2 10*3/uL (ref 0.0–0.7)
Eosinophils Relative: 2 % (ref 0–5)
HCT: 31 % — ABNORMAL LOW (ref 39.0–52.0)
Hemoglobin: 10 g/dL — ABNORMAL LOW (ref 13.0–17.0)
LYMPHS PCT: 8 % — AB (ref 12–46)
Lymphs Abs: 0.5 10*3/uL — ABNORMAL LOW (ref 0.7–4.0)
MCH: 28.3 pg (ref 26.0–34.0)
MCHC: 32.3 g/dL (ref 30.0–36.0)
MCV: 87.8 fL (ref 78.0–100.0)
Monocytes Absolute: 0.8 10*3/uL (ref 0.1–1.0)
Monocytes Relative: 12 % (ref 3–12)
Neutro Abs: 5.3 10*3/uL (ref 1.7–7.7)
Neutrophils Relative %: 78 % — ABNORMAL HIGH (ref 43–77)
Platelets: 213 10*3/uL (ref 150–400)
RBC: 3.53 MIL/uL — ABNORMAL LOW (ref 4.22–5.81)
RDW: 15.1 % (ref 11.5–15.5)
WBC: 6.8 10*3/uL (ref 4.0–10.5)

## 2013-07-14 LAB — KAPPA/LAMBDA LIGHT CHAINS
KAPPA FREE LGHT CHN: 18.9 mg/dL — AB (ref 0.33–1.94)
Kappa, lambda light chain ratio: 1.24 (ref 0.26–1.65)
Lambda free light chains: 15.2 mg/dL — ABNORMAL HIGH (ref 0.57–2.63)

## 2013-07-14 LAB — HEPATITIS B SURFACE ANTIGEN: HEP B S AG: NEGATIVE

## 2013-07-15 LAB — BASIC METABOLIC PANEL
BUN: 21 mg/dL (ref 6–23)
CALCIUM: 8.7 mg/dL (ref 8.4–10.5)
CHLORIDE: 97 meq/L (ref 96–112)
CO2: 29 mEq/L (ref 19–32)
CREATININE: 4.83 mg/dL — AB (ref 0.50–1.35)
GFR calc non Af Amer: 10 mL/min — ABNORMAL LOW (ref >90)
GFR, EST AFRICAN AMERICAN: 12 mL/min — AB (ref >90)
Glucose, Bld: 90 mg/dL (ref 70–99)
Potassium: 4.4 mEq/L (ref 3.7–5.3)
Sodium: 140 mEq/L (ref 137–147)

## 2013-07-15 LAB — CBC
HCT: 30.2 % — ABNORMAL LOW (ref 39.0–52.0)
HEMOGLOBIN: 9.6 g/dL — AB (ref 13.0–17.0)
MCH: 28.2 pg (ref 26.0–34.0)
MCHC: 31.8 g/dL (ref 30.0–36.0)
MCV: 88.6 fL (ref 78.0–100.0)
PLATELETS: 189 10*3/uL (ref 150–400)
RBC: 3.41 MIL/uL — ABNORMAL LOW (ref 4.22–5.81)
RDW: 15.1 % (ref 11.5–15.5)
WBC: 6.8 10*3/uL (ref 4.0–10.5)

## 2013-07-16 LAB — RENAL FUNCTION PANEL
ALBUMIN: 3.2 g/dL — AB (ref 3.5–5.2)
BUN: 29 mg/dL — ABNORMAL HIGH (ref 6–23)
CO2: 27 mEq/L (ref 19–32)
CREATININE: 6.52 mg/dL — AB (ref 0.50–1.35)
Calcium: 9.2 mg/dL (ref 8.4–10.5)
Chloride: 96 mEq/L (ref 96–112)
GFR calc Af Amer: 8 mL/min — ABNORMAL LOW (ref >90)
GFR, EST NON AFRICAN AMERICAN: 7 mL/min — AB (ref >90)
Glucose, Bld: 88 mg/dL (ref 70–99)
PHOSPHORUS: 3.9 mg/dL (ref 2.3–4.6)
Potassium: 4.6 mEq/L (ref 3.7–5.3)
Sodium: 138 mEq/L (ref 137–147)

## 2013-07-16 LAB — CBC
HEMATOCRIT: 30.2 % — AB (ref 39.0–52.0)
Hemoglobin: 9.8 g/dL — ABNORMAL LOW (ref 13.0–17.0)
MCH: 28.6 pg (ref 26.0–34.0)
MCHC: 32.5 g/dL (ref 30.0–36.0)
MCV: 88 fL (ref 78.0–100.0)
PLATELETS: 186 10*3/uL (ref 150–400)
RBC: 3.43 MIL/uL — ABNORMAL LOW (ref 4.22–5.81)
RDW: 15.2 % (ref 11.5–15.5)
WBC: 6.1 10*3/uL (ref 4.0–10.5)

## 2013-07-19 LAB — COMPREHENSIVE METABOLIC PANEL
ALK PHOS: 104 U/L (ref 39–117)
ALT: 8 U/L (ref 0–53)
AST: 16 U/L (ref 0–37)
Albumin: 3.2 g/dL — ABNORMAL LOW (ref 3.5–5.2)
BUN: 63 mg/dL — ABNORMAL HIGH (ref 6–23)
CO2: 28 meq/L (ref 19–32)
Calcium: 9.4 mg/dL (ref 8.4–10.5)
Chloride: 94 mEq/L — ABNORMAL LOW (ref 96–112)
Creatinine, Ser: 7.89 mg/dL — ABNORMAL HIGH (ref 0.50–1.35)
GFR, EST AFRICAN AMERICAN: 7 mL/min — AB (ref >90)
GFR, EST NON AFRICAN AMERICAN: 6 mL/min — AB (ref >90)
GLUCOSE: 105 mg/dL — AB (ref 70–99)
POTASSIUM: 5 meq/L (ref 3.7–5.3)
Sodium: 138 mEq/L (ref 137–147)
Total Protein: 7 g/dL (ref 6.0–8.3)

## 2013-07-19 LAB — CBC WITH DIFFERENTIAL/PLATELET
Basophils Absolute: 0 10*3/uL (ref 0.0–0.1)
Basophils Relative: 0 % (ref 0–1)
EOS PCT: 2 % (ref 0–5)
Eosinophils Absolute: 0.1 10*3/uL (ref 0.0–0.7)
HEMATOCRIT: 30.8 % — AB (ref 39.0–52.0)
Hemoglobin: 9.9 g/dL — ABNORMAL LOW (ref 13.0–17.0)
LYMPHS ABS: 0.8 10*3/uL (ref 0.7–4.0)
LYMPHS PCT: 12 % (ref 12–46)
MCH: 28.4 pg (ref 26.0–34.0)
MCHC: 32.1 g/dL (ref 30.0–36.0)
MCV: 88.5 fL (ref 78.0–100.0)
Monocytes Absolute: 0.6 10*3/uL (ref 0.1–1.0)
Monocytes Relative: 9 % (ref 3–12)
Neutro Abs: 5 10*3/uL (ref 1.7–7.7)
Neutrophils Relative %: 77 % (ref 43–77)
PLATELETS: 230 10*3/uL (ref 150–400)
RBC: 3.48 MIL/uL — AB (ref 4.22–5.81)
RDW: 15.4 % (ref 11.5–15.5)
WBC: 6.6 10*3/uL (ref 4.0–10.5)

## 2013-07-19 LAB — PREALBUMIN: PREALBUMIN: 21.3 mg/dL (ref 17.0–34.0)

## 2013-07-19 LAB — C-REACTIVE PROTEIN: CRP: 1.7 mg/dL — ABNORMAL HIGH (ref <0.60)

## 2013-07-19 LAB — SEDIMENTATION RATE: SED RATE: 58 mm/h — AB (ref 0–16)

## 2013-07-19 LAB — DIGOXIN LEVEL: Digoxin Level: <0.3 ng/mL — ABNORMAL LOW (ref 0.8–2.0)

## 2013-07-20 ENCOUNTER — Other Ambulatory Visit (HOSPITAL_COMMUNITY): Payer: Medicare Other

## 2013-07-21 LAB — CBC
HCT: 28.2 % — ABNORMAL LOW (ref 39.0–52.0)
HEMOGLOBIN: 9 g/dL — AB (ref 13.0–17.0)
MCH: 28.1 pg (ref 26.0–34.0)
MCHC: 31.9 g/dL (ref 30.0–36.0)
MCV: 88.1 fL (ref 78.0–100.0)
PLATELETS: 238 10*3/uL (ref 150–400)
RBC: 3.2 MIL/uL — ABNORMAL LOW (ref 4.22–5.81)
RDW: 15.6 % — AB (ref 11.5–15.5)
WBC: 7.7 10*3/uL (ref 4.0–10.5)

## 2013-07-21 LAB — RENAL FUNCTION PANEL
ALBUMIN: 2.9 g/dL — AB (ref 3.5–5.2)
BUN: 73 mg/dL — ABNORMAL HIGH (ref 6–23)
CALCIUM: 9.6 mg/dL (ref 8.4–10.5)
CO2: 28 mEq/L (ref 19–32)
Chloride: 96 mEq/L (ref 96–112)
Creatinine, Ser: 8.29 mg/dL — ABNORMAL HIGH (ref 0.50–1.35)
GFR calc non Af Amer: 5 mL/min — ABNORMAL LOW (ref >90)
GFR, EST AFRICAN AMERICAN: 6 mL/min — AB (ref >90)
GLUCOSE: 131 mg/dL — AB (ref 70–99)
POTASSIUM: 4.9 meq/L (ref 3.7–5.3)
Phosphorus: 2.8 mg/dL (ref 2.3–4.6)
Sodium: 138 mEq/L (ref 137–147)

## 2013-07-23 LAB — CBC
HEMATOCRIT: 29.6 % — AB (ref 39.0–52.0)
Hemoglobin: 9.5 g/dL — ABNORMAL LOW (ref 13.0–17.0)
MCH: 27.9 pg (ref 26.0–34.0)
MCHC: 32.1 g/dL (ref 30.0–36.0)
MCV: 86.8 fL (ref 78.0–100.0)
PLATELETS: 233 10*3/uL (ref 150–400)
RBC: 3.41 MIL/uL — ABNORMAL LOW (ref 4.22–5.81)
RDW: 15.7 % — ABNORMAL HIGH (ref 11.5–15.5)
WBC: 6.5 10*3/uL (ref 4.0–10.5)

## 2013-07-23 LAB — RENAL FUNCTION PANEL
ALBUMIN: 3.2 g/dL — AB (ref 3.5–5.2)
BUN: 59 mg/dL — AB (ref 6–23)
CO2: 27 mEq/L (ref 19–32)
CREATININE: 7.38 mg/dL — AB (ref 0.50–1.35)
Calcium: 10 mg/dL (ref 8.4–10.5)
Chloride: 93 mEq/L — ABNORMAL LOW (ref 96–112)
GFR calc Af Amer: 7 mL/min — ABNORMAL LOW (ref >90)
GFR, EST NON AFRICAN AMERICAN: 6 mL/min — AB (ref >90)
Glucose, Bld: 111 mg/dL — ABNORMAL HIGH (ref 70–99)
PHOSPHORUS: 2.8 mg/dL (ref 2.3–4.6)
Potassium: 5.1 mEq/L (ref 3.7–5.3)
Sodium: 136 mEq/L — ABNORMAL LOW (ref 137–147)

## 2013-07-25 LAB — CBC
HCT: 31.5 % — ABNORMAL LOW (ref 39.0–52.0)
Hemoglobin: 10.3 g/dL — ABNORMAL LOW (ref 13.0–17.0)
MCH: 28.2 pg (ref 26.0–34.0)
MCHC: 32.7 g/dL (ref 30.0–36.0)
MCV: 86.3 fL (ref 78.0–100.0)
PLATELETS: 248 10*3/uL (ref 150–400)
RBC: 3.65 MIL/uL — ABNORMAL LOW (ref 4.22–5.81)
RDW: 15.3 % (ref 11.5–15.5)
WBC: 7.1 10*3/uL (ref 4.0–10.5)

## 2013-07-25 LAB — BASIC METABOLIC PANEL
BUN: 63 mg/dL — ABNORMAL HIGH (ref 6–23)
CALCIUM: 10.5 mg/dL (ref 8.4–10.5)
CO2: 26 mEq/L (ref 19–32)
CREATININE: 7.56 mg/dL — AB (ref 0.50–1.35)
Chloride: 94 mEq/L — ABNORMAL LOW (ref 96–112)
GFR calc non Af Amer: 6 mL/min — ABNORMAL LOW (ref >90)
GFR, EST AFRICAN AMERICAN: 7 mL/min — AB (ref >90)
Glucose, Bld: 114 mg/dL — ABNORMAL HIGH (ref 70–99)
Potassium: 4.9 mEq/L (ref 3.7–5.3)
Sodium: 135 mEq/L — ABNORMAL LOW (ref 137–147)

## 2013-07-26 LAB — CBC
HCT: 30.6 % — ABNORMAL LOW (ref 39.0–52.0)
Hemoglobin: 10.1 g/dL — ABNORMAL LOW (ref 13.0–17.0)
MCH: 28.3 pg (ref 26.0–34.0)
MCHC: 33 g/dL (ref 30.0–36.0)
MCV: 85.7 fL (ref 78.0–100.0)
PLATELETS: 271 10*3/uL (ref 150–400)
RBC: 3.57 MIL/uL — ABNORMAL LOW (ref 4.22–5.81)
RDW: 15.2 % (ref 11.5–15.5)
WBC: 7.2 10*3/uL (ref 4.0–10.5)

## 2013-07-26 LAB — RENAL FUNCTION PANEL
ALBUMIN: 3.3 g/dL — AB (ref 3.5–5.2)
BUN: 86 mg/dL — ABNORMAL HIGH (ref 6–23)
CHLORIDE: 94 meq/L — AB (ref 96–112)
CO2: 26 mEq/L (ref 19–32)
CREATININE: 9.62 mg/dL — AB (ref 0.50–1.35)
Calcium: 10.9 mg/dL — ABNORMAL HIGH (ref 8.4–10.5)
GFR calc Af Amer: 5 mL/min — ABNORMAL LOW (ref >90)
GFR, EST NON AFRICAN AMERICAN: 4 mL/min — AB (ref >90)
Glucose, Bld: 105 mg/dL — ABNORMAL HIGH (ref 70–99)
POTASSIUM: 5.5 meq/L — AB (ref 3.7–5.3)
Phosphorus: 3.3 mg/dL (ref 2.3–4.6)
Sodium: 138 mEq/L (ref 137–147)

## 2013-07-28 LAB — CBC
HCT: 31.2 % — ABNORMAL LOW (ref 39.0–52.0)
Hemoglobin: 9.9 g/dL — ABNORMAL LOW (ref 13.0–17.0)
MCH: 27.5 pg (ref 26.0–34.0)
MCHC: 31.7 g/dL (ref 30.0–36.0)
MCV: 86.7 fL (ref 78.0–100.0)
Platelets: 270 10*3/uL (ref 150–400)
RBC: 3.6 MIL/uL — ABNORMAL LOW (ref 4.22–5.81)
RDW: 15.6 % — ABNORMAL HIGH (ref 11.5–15.5)
WBC: 6.8 10*3/uL (ref 4.0–10.5)

## 2013-07-28 LAB — RENAL FUNCTION PANEL
Albumin: 3.3 g/dL — ABNORMAL LOW (ref 3.5–5.2)
BUN: 68 mg/dL — ABNORMAL HIGH (ref 6–23)
CALCIUM: 10.2 mg/dL (ref 8.4–10.5)
CO2: 27 meq/L (ref 19–32)
Chloride: 93 mEq/L — ABNORMAL LOW (ref 96–112)
Creatinine, Ser: 8.57 mg/dL — ABNORMAL HIGH (ref 0.50–1.35)
GFR calc Af Amer: 6 mL/min — ABNORMAL LOW (ref >90)
GFR calc non Af Amer: 5 mL/min — ABNORMAL LOW (ref >90)
Glucose, Bld: 108 mg/dL — ABNORMAL HIGH (ref 70–99)
Phosphorus: 3 mg/dL (ref 2.3–4.6)
Potassium: 5.1 mEq/L (ref 3.7–5.3)
SODIUM: 136 meq/L — AB (ref 137–147)

## 2013-07-29 LAB — CBC
HCT: 28.4 % — ABNORMAL LOW (ref 39.0–52.0)
HEMOGLOBIN: 8.9 g/dL — AB (ref 13.0–17.0)
MCH: 27.1 pg (ref 26.0–34.0)
MCHC: 31.3 g/dL (ref 30.0–36.0)
MCV: 86.3 fL (ref 78.0–100.0)
Platelets: 221 10*3/uL (ref 150–400)
RBC: 3.29 MIL/uL — ABNORMAL LOW (ref 4.22–5.81)
RDW: 15.2 % (ref 11.5–15.5)
WBC: 6.5 10*3/uL (ref 4.0–10.5)

## 2013-07-29 LAB — HEMOGLOBIN AND HEMATOCRIT, BLOOD
HCT: 27.8 % — ABNORMAL LOW (ref 39.0–52.0)
Hemoglobin: 8.9 g/dL — ABNORMAL LOW (ref 13.0–17.0)

## 2013-07-29 LAB — PROTIME-INR
INR: 1.05 (ref 0.00–1.49)
PROTHROMBIN TIME: 13.5 s (ref 11.6–15.2)

## 2013-07-30 LAB — RENAL FUNCTION PANEL
ALBUMIN: 3.4 g/dL — AB (ref 3.5–5.2)
BUN: 63 mg/dL — ABNORMAL HIGH (ref 6–23)
CALCIUM: 10.5 mg/dL (ref 8.4–10.5)
CO2: 26 mEq/L (ref 19–32)
CREATININE: 8.46 mg/dL — AB (ref 0.50–1.35)
Chloride: 94 mEq/L — ABNORMAL LOW (ref 96–112)
GFR calc Af Amer: 6 mL/min — ABNORMAL LOW (ref >90)
GFR calc non Af Amer: 5 mL/min — ABNORMAL LOW (ref >90)
Glucose, Bld: 95 mg/dL (ref 70–99)
PHOSPHORUS: 2.7 mg/dL (ref 2.3–4.6)
Potassium: 5 mEq/L (ref 3.7–5.3)
Sodium: 137 mEq/L (ref 137–147)

## 2013-07-30 LAB — CBC
HCT: 32.7 % — ABNORMAL LOW (ref 39.0–52.0)
Hemoglobin: 10.4 g/dL — ABNORMAL LOW (ref 13.0–17.0)
MCH: 27.8 pg (ref 26.0–34.0)
MCHC: 31.8 g/dL (ref 30.0–36.0)
MCV: 87.4 fL (ref 78.0–100.0)
PLATELETS: 264 10*3/uL (ref 150–400)
RBC: 3.74 MIL/uL — ABNORMAL LOW (ref 4.22–5.81)
RDW: 15.3 % (ref 11.5–15.5)
WBC: 6.7 10*3/uL (ref 4.0–10.5)

## 2013-07-31 LAB — HEMOGLOBIN AND HEMATOCRIT, BLOOD
HCT: 32.2 % — ABNORMAL LOW (ref 39.0–52.0)
Hemoglobin: 10.2 g/dL — ABNORMAL LOW (ref 13.0–17.0)

## 2013-08-02 LAB — CBC
HEMATOCRIT: 32 % — AB (ref 39.0–52.0)
HEMOGLOBIN: 10.1 g/dL — AB (ref 13.0–17.0)
MCH: 27.2 pg (ref 26.0–34.0)
MCHC: 31.6 g/dL (ref 30.0–36.0)
MCV: 86.3 fL (ref 78.0–100.0)
Platelets: 245 10*3/uL (ref 150–400)
RBC: 3.71 MIL/uL — ABNORMAL LOW (ref 4.22–5.81)
RDW: 15.3 % (ref 11.5–15.5)
WBC: 6.4 10*3/uL (ref 4.0–10.5)

## 2013-08-02 LAB — RENAL FUNCTION PANEL
Albumin: 3.5 g/dL (ref 3.5–5.2)
BUN: 69 mg/dL — AB (ref 6–23)
CO2: 23 meq/L (ref 19–32)
Calcium: 10.1 mg/dL (ref 8.4–10.5)
Chloride: 95 mEq/L — ABNORMAL LOW (ref 96–112)
Creatinine, Ser: 9.63 mg/dL — ABNORMAL HIGH (ref 0.50–1.35)
GFR calc non Af Amer: 4 mL/min — ABNORMAL LOW (ref >90)
GFR, EST AFRICAN AMERICAN: 5 mL/min — AB (ref >90)
GLUCOSE: 106 mg/dL — AB (ref 70–99)
POTASSIUM: 5.6 meq/L — AB (ref 3.7–5.3)
Phosphorus: 3.3 mg/dL (ref 2.3–4.6)
Sodium: 138 mEq/L (ref 137–147)

## 2013-08-02 LAB — PREALBUMIN: Prealbumin: 22.8 mg/dL (ref 17.0–34.0)

## 2013-08-07 ENCOUNTER — Inpatient Hospital Stay: Payer: Self-pay | Admitting: Internal Medicine

## 2013-08-07 LAB — CBC WITH DIFFERENTIAL/PLATELET
BASOS ABS: 0 10*3/uL (ref 0.0–0.1)
Basophil %: 0.5 %
EOS ABS: 0.2 10*3/uL (ref 0.0–0.7)
Eosinophil %: 1.8 %
HCT: 33.1 % — ABNORMAL LOW (ref 40.0–52.0)
HGB: 10.4 g/dL — ABNORMAL LOW (ref 13.0–18.0)
Lymphocyte #: 0.4 10*3/uL — ABNORMAL LOW (ref 1.0–3.6)
Lymphocyte %: 4.3 %
MCH: 26.8 pg (ref 26.0–34.0)
MCHC: 31.5 g/dL — ABNORMAL LOW (ref 32.0–36.0)
MCV: 85 fL (ref 80–100)
MONOS PCT: 11 %
Monocyte #: 0.9 x10 3/mm (ref 0.2–1.0)
NEUTROS ABS: 7.1 10*3/uL — AB (ref 1.4–6.5)
Neutrophil %: 82.4 %
PLATELETS: 154 10*3/uL (ref 150–440)
RBC: 3.89 10*6/uL — ABNORMAL LOW (ref 4.40–5.90)
RDW: 16.2 % — ABNORMAL HIGH (ref 11.5–14.5)
WBC: 8.6 10*3/uL (ref 3.8–10.6)

## 2013-08-07 LAB — COMPREHENSIVE METABOLIC PANEL
ALBUMIN: 3 g/dL — AB (ref 3.4–5.0)
ALK PHOS: 83 U/L
ANION GAP: 8 (ref 7–16)
BUN: 46 mg/dL — ABNORMAL HIGH (ref 7–18)
Bilirubin,Total: 0.4 mg/dL (ref 0.2–1.0)
CALCIUM: 8.6 mg/dL (ref 8.5–10.1)
Chloride: 98 mmol/L (ref 98–107)
Co2: 29 mmol/L (ref 21–32)
Creatinine: 7.27 mg/dL — ABNORMAL HIGH (ref 0.60–1.30)
EGFR (African American): 7 — ABNORMAL LOW
EGFR (Non-African Amer.): 6 — ABNORMAL LOW
GLUCOSE: 157 mg/dL — AB (ref 65–99)
Osmolality: 285 (ref 275–301)
Potassium: 4.5 mmol/L (ref 3.5–5.1)
SGOT(AST): 31 U/L (ref 15–37)
SGPT (ALT): 16 U/L (ref 12–78)
Sodium: 135 mmol/L — ABNORMAL LOW (ref 136–145)
Total Protein: 7.4 g/dL (ref 6.4–8.2)

## 2013-08-07 LAB — PROTIME-INR
INR: 1.1
Prothrombin Time: 14.1 secs (ref 11.5–14.7)

## 2013-08-08 DIAGNOSIS — R000 Tachycardia, unspecified: Secondary | ICD-10-CM

## 2013-08-08 DIAGNOSIS — I498 Other specified cardiac arrhythmias: Secondary | ICD-10-CM

## 2013-08-08 LAB — BASIC METABOLIC PANEL
ANION GAP: 9 (ref 7–16)
BUN: 52 mg/dL — ABNORMAL HIGH (ref 7–18)
CALCIUM: 8.7 mg/dL (ref 8.5–10.1)
CREATININE: 7.78 mg/dL — AB (ref 0.60–1.30)
Chloride: 99 mmol/L (ref 98–107)
Co2: 29 mmol/L (ref 21–32)
EGFR (African American): 7 — ABNORMAL LOW
GFR CALC NON AF AMER: 6 — AB
Glucose: 136 mg/dL — ABNORMAL HIGH (ref 65–99)
Osmolality: 290 (ref 275–301)
POTASSIUM: 4.5 mmol/L (ref 3.5–5.1)
SODIUM: 137 mmol/L (ref 136–145)

## 2013-08-08 LAB — CBC WITH DIFFERENTIAL/PLATELET
Basophil #: 0.1 10*3/uL (ref 0.0–0.1)
Basophil %: 0.7 %
EOS PCT: 1.7 %
Eosinophil #: 0.2 10*3/uL (ref 0.0–0.7)
HCT: 32.8 % — ABNORMAL LOW (ref 40.0–52.0)
HGB: 10.1 g/dL — AB (ref 13.0–18.0)
Lymphocyte #: 0.5 10*3/uL — ABNORMAL LOW (ref 1.0–3.6)
Lymphocyte %: 5.8 %
MCH: 26.5 pg (ref 26.0–34.0)
MCHC: 30.9 g/dL — ABNORMAL LOW (ref 32.0–36.0)
MCV: 86 fL (ref 80–100)
Monocyte #: 1.1 x10 3/mm — ABNORMAL HIGH (ref 0.2–1.0)
Monocyte %: 11.9 %
Neutrophil #: 7.1 10*3/uL — ABNORMAL HIGH (ref 1.4–6.5)
Neutrophil %: 79.9 %
PLATELETS: 180 10*3/uL (ref 150–440)
RBC: 3.83 10*6/uL — AB (ref 4.40–5.90)
RDW: 16.1 % — ABNORMAL HIGH (ref 11.5–14.5)
WBC: 8.9 10*3/uL (ref 3.8–10.6)

## 2013-08-09 DIAGNOSIS — I4891 Unspecified atrial fibrillation: Secondary | ICD-10-CM

## 2013-08-09 LAB — CBC WITH DIFFERENTIAL/PLATELET
Basophil #: 0 10*3/uL (ref 0.0–0.1)
Basophil %: 0.1 %
Eosinophil #: 0 10*3/uL (ref 0.0–0.7)
Eosinophil %: 0.2 %
HCT: 28.7 % — ABNORMAL LOW (ref 40.0–52.0)
HGB: 9.1 g/dL — ABNORMAL LOW (ref 13.0–18.0)
Lymphocyte #: 0.2 10*3/uL — ABNORMAL LOW (ref 1.0–3.6)
Lymphocyte %: 3.2 %
MCH: 26.7 pg (ref 26.0–34.0)
MCHC: 31.7 g/dL — AB (ref 32.0–36.0)
MCV: 84 fL (ref 80–100)
Monocyte #: 0.9 x10 3/mm (ref 0.2–1.0)
Monocyte %: 11.1 %
NEUTROS ABS: 6.7 10*3/uL — AB (ref 1.4–6.5)
Neutrophil %: 85.4 %
Platelet: 193 10*3/uL (ref 150–440)
RBC: 3.4 10*6/uL — AB (ref 4.40–5.90)
RDW: 16 % — ABNORMAL HIGH (ref 11.5–14.5)
WBC: 7.8 10*3/uL (ref 3.8–10.6)

## 2013-08-09 LAB — BASIC METABOLIC PANEL
ANION GAP: 12 (ref 7–16)
BUN: 72 mg/dL — AB (ref 7–18)
CALCIUM: 8.4 mg/dL — AB (ref 8.5–10.1)
CREATININE: 9.88 mg/dL — AB (ref 0.60–1.30)
Chloride: 97 mmol/L — ABNORMAL LOW (ref 98–107)
Co2: 27 mmol/L (ref 21–32)
EGFR (Non-African Amer.): 4 — ABNORMAL LOW
GFR CALC AF AMER: 5 — AB
Glucose: 152 mg/dL — ABNORMAL HIGH (ref 65–99)
Osmolality: 296 (ref 275–301)
Potassium: 4.4 mmol/L (ref 3.5–5.1)
Sodium: 136 mmol/L (ref 136–145)

## 2013-08-09 LAB — RENAL FUNCTION PANEL
Albumin: 2.8 g/dL — ABNORMAL LOW (ref 3.4–5.0)
Anion Gap: 12 (ref 7–16)
BUN: 74 mg/dL — AB (ref 7–18)
CALCIUM: 8.3 mg/dL — AB (ref 8.5–10.1)
CHLORIDE: 98 mmol/L (ref 98–107)
CREATININE: 9.9 mg/dL — AB (ref 0.60–1.30)
Co2: 27 mmol/L (ref 21–32)
EGFR (African American): 5 — ABNORMAL LOW
EGFR (Non-African Amer.): 4 — ABNORMAL LOW
Glucose: 158 mg/dL — ABNORMAL HIGH (ref 65–99)
Osmolality: 299 (ref 275–301)
PHOSPHORUS: 5 mg/dL — AB (ref 2.5–4.9)
POTASSIUM: 4.4 mmol/L (ref 3.5–5.1)
Sodium: 137 mmol/L (ref 136–145)

## 2013-08-10 LAB — HEMOGLOBIN: HGB: 9.8 g/dL — AB (ref 13.0–18.0)

## 2013-09-28 ENCOUNTER — Emergency Department: Payer: Self-pay | Admitting: Emergency Medicine

## 2013-11-26 ENCOUNTER — Inpatient Hospital Stay: Payer: Self-pay | Admitting: Internal Medicine

## 2013-11-26 LAB — CK-MB
CK-MB: 10.9 ng/mL — AB (ref 0.5–3.6)
CK-MB: 5.7 ng/mL — AB (ref 0.5–3.6)
CK-MB: 6.5 ng/mL — ABNORMAL HIGH (ref 0.5–3.6)

## 2013-11-26 LAB — CBC WITH DIFFERENTIAL/PLATELET
BASOS PCT: 0.3 %
Basophil #: 0 10*3/uL (ref 0.0–0.1)
Eosinophil #: 0 10*3/uL (ref 0.0–0.7)
Eosinophil %: 0 %
HCT: 34.9 % — AB (ref 40.0–52.0)
HGB: 11 g/dL — AB (ref 13.0–18.0)
Lymphocyte #: 0.2 10*3/uL — ABNORMAL LOW (ref 1.0–3.6)
Lymphocyte %: 1.3 %
MCH: 27.2 pg (ref 26.0–34.0)
MCHC: 31.6 g/dL — ABNORMAL LOW (ref 32.0–36.0)
MCV: 86 fL (ref 80–100)
MONO ABS: 1.4 x10 3/mm — AB (ref 0.2–1.0)
Monocyte %: 8.3 %
Neutrophil #: 14.9 10*3/uL — ABNORMAL HIGH (ref 1.4–6.5)
Neutrophil %: 90.1 %
Platelet: 213 10*3/uL (ref 150–440)
RBC: 4.05 10*6/uL — AB (ref 4.40–5.90)
RDW: 19.4 % — ABNORMAL HIGH (ref 11.5–14.5)
WBC: 16.5 10*3/uL — ABNORMAL HIGH (ref 3.8–10.6)

## 2013-11-26 LAB — TROPONIN I
Troponin-I: 0.44 ng/mL — ABNORMAL HIGH
Troponin-I: 0.99 ng/mL — ABNORMAL HIGH
Troponin-I: 1.2 ng/mL — ABNORMAL HIGH

## 2013-11-26 LAB — COMPREHENSIVE METABOLIC PANEL
ALBUMIN: 2.8 g/dL — AB (ref 3.4–5.0)
Alkaline Phosphatase: 123 U/L — ABNORMAL HIGH
Anion Gap: 17 — ABNORMAL HIGH (ref 7–16)
BUN: 70 mg/dL — ABNORMAL HIGH (ref 7–18)
Bilirubin,Total: 0.4 mg/dL (ref 0.2–1.0)
CREATININE: 9.75 mg/dL — AB (ref 0.60–1.30)
Calcium, Total: 9.2 mg/dL (ref 8.5–10.1)
Chloride: 97 mmol/L — ABNORMAL LOW (ref 98–107)
Co2: 26 mmol/L (ref 21–32)
EGFR (African American): 5 — ABNORMAL LOW
EGFR (Non-African Amer.): 4 — ABNORMAL LOW
GLUCOSE: 160 mg/dL — AB (ref 65–99)
Osmolality: 303 (ref 275–301)
POTASSIUM: 4.8 mmol/L (ref 3.5–5.1)
SGOT(AST): 36 U/L (ref 15–37)
SGPT (ALT): 20 U/L
Sodium: 140 mmol/L (ref 136–145)
Total Protein: 8 g/dL (ref 6.4–8.2)

## 2013-11-26 LAB — PHOSPHORUS: Phosphorus: 2.2 mg/dL — ABNORMAL LOW (ref 2.5–4.9)

## 2013-11-26 LAB — PROTIME-INR
INR: 1.1
PROTHROMBIN TIME: 14 s (ref 11.5–14.7)

## 2013-11-26 LAB — MAGNESIUM: MAGNESIUM: 1.9 mg/dL

## 2013-11-27 DIAGNOSIS — A419 Sepsis, unspecified organism: Secondary | ICD-10-CM

## 2013-11-27 DIAGNOSIS — I10 Essential (primary) hypertension: Secondary | ICD-10-CM

## 2013-11-27 DIAGNOSIS — R7989 Other specified abnormal findings of blood chemistry: Secondary | ICD-10-CM

## 2013-11-27 DIAGNOSIS — I4891 Unspecified atrial fibrillation: Secondary | ICD-10-CM

## 2013-11-27 LAB — CBC WITH DIFFERENTIAL/PLATELET
Basophil #: 0 10*3/uL (ref 0.0–0.1)
Basophil %: 0.3 %
Eosinophil #: 0 10*3/uL (ref 0.0–0.7)
Eosinophil %: 0 %
HCT: 31.2 % — AB (ref 40.0–52.0)
HGB: 9.6 g/dL — AB (ref 13.0–18.0)
Lymphocyte #: 0.2 10*3/uL — ABNORMAL LOW (ref 1.0–3.6)
Lymphocyte %: 1.8 %
MCH: 27.1 pg (ref 26.0–34.0)
MCHC: 30.9 g/dL — AB (ref 32.0–36.0)
MCV: 88 fL (ref 80–100)
MONOS PCT: 14.8 %
Monocyte #: 2 x10 3/mm — ABNORMAL HIGH (ref 0.2–1.0)
NEUTROS PCT: 83.1 %
Neutrophil #: 11.3 10*3/uL — ABNORMAL HIGH (ref 1.4–6.5)
Platelet: 177 10*3/uL (ref 150–440)
RBC: 3.55 10*6/uL — ABNORMAL LOW (ref 4.40–5.90)
RDW: 19.1 % — AB (ref 11.5–14.5)
WBC: 13.6 10*3/uL — ABNORMAL HIGH (ref 3.8–10.6)

## 2013-11-27 LAB — TROPONIN I: TROPONIN-I: 0.88 ng/mL — AB

## 2013-11-28 DIAGNOSIS — I059 Rheumatic mitral valve disease, unspecified: Secondary | ICD-10-CM

## 2013-11-28 LAB — CLOSTRIDIUM DIFFICILE(ARMC)

## 2013-11-29 ENCOUNTER — Encounter: Payer: Self-pay | Admitting: Cardiovascular Disease

## 2013-11-29 LAB — RENAL FUNCTION PANEL
Albumin: 2 g/dL — ABNORMAL LOW (ref 3.4–5.0)
Anion Gap: 12 (ref 7–16)
BUN: 69 mg/dL — AB (ref 7–18)
CALCIUM: 7.9 mg/dL — AB (ref 8.5–10.1)
CREATININE: 10.25 mg/dL — AB (ref 0.60–1.30)
Chloride: 103 mmol/L (ref 98–107)
Co2: 25 mmol/L (ref 21–32)
EGFR (African American): 5 — ABNORMAL LOW
GFR CALC NON AF AMER: 4 — AB
GLUCOSE: 175 mg/dL — AB (ref 65–99)
OSMOLALITY: 304 (ref 275–301)
POTASSIUM: 4.3 mmol/L (ref 3.5–5.1)
Phosphorus: 3.8 mg/dL (ref 2.5–4.9)
Sodium: 140 mmol/L (ref 136–145)

## 2013-11-29 LAB — CBC WITH DIFFERENTIAL/PLATELET
BASOS ABS: 0.1 10*3/uL (ref 0.0–0.1)
BASOS PCT: 0.5 %
EOS ABS: 0.2 10*3/uL (ref 0.0–0.7)
EOS PCT: 1.6 %
HCT: 28.8 % — ABNORMAL LOW (ref 40.0–52.0)
HGB: 8.7 g/dL — ABNORMAL LOW (ref 13.0–18.0)
Lymphocyte #: 0.4 10*3/uL — ABNORMAL LOW (ref 1.0–3.6)
Lymphocyte %: 4.1 %
MCH: 26.9 pg (ref 26.0–34.0)
MCHC: 30.4 g/dL — AB (ref 32.0–36.0)
MCV: 89 fL (ref 80–100)
MONOS PCT: 10.5 %
Monocyte #: 1.2 x10 3/mm — ABNORMAL HIGH (ref 0.2–1.0)
NEUTROS ABS: 9.2 10*3/uL — AB (ref 1.4–6.5)
Neutrophil %: 83.3 %
PLATELETS: 187 10*3/uL (ref 150–440)
RBC: 3.25 10*6/uL — AB (ref 4.40–5.90)
RDW: 19 % — AB (ref 11.5–14.5)
WBC: 11 10*3/uL — AB (ref 3.8–10.6)

## 2013-11-29 LAB — BASIC METABOLIC PANEL
ANION GAP: 16 (ref 7–16)
BUN: 67 mg/dL — AB (ref 7–18)
CO2: 21 mmol/L (ref 21–32)
Calcium, Total: 8 mg/dL — ABNORMAL LOW (ref 8.5–10.1)
Chloride: 102 mmol/L (ref 98–107)
Creatinine: 10.2 mg/dL — ABNORMAL HIGH (ref 0.60–1.30)
EGFR (Non-African Amer.): 4 — ABNORMAL LOW
GFR CALC AF AMER: 5 — AB
Glucose: 174 mg/dL — ABNORMAL HIGH (ref 65–99)
OSMOLALITY: 301 (ref 275–301)
Potassium: 4.3 mmol/L (ref 3.5–5.1)
SODIUM: 139 mmol/L (ref 136–145)

## 2013-11-30 LAB — BASIC METABOLIC PANEL
ANION GAP: 12 (ref 7–16)
BUN: 37 mg/dL — AB (ref 7–18)
CALCIUM: 8.2 mg/dL — AB (ref 8.5–10.1)
CHLORIDE: 99 mmol/L (ref 98–107)
CO2: 28 mmol/L (ref 21–32)
Creatinine: 5.81 mg/dL — ABNORMAL HIGH (ref 0.60–1.30)
EGFR (Non-African Amer.): 8 — ABNORMAL LOW
GFR CALC AF AMER: 10 — AB
Glucose: 235 mg/dL — ABNORMAL HIGH (ref 65–99)
OSMOLALITY: 294 (ref 275–301)
Potassium: 4 mmol/L (ref 3.5–5.1)
Sodium: 139 mmol/L (ref 136–145)

## 2013-12-01 LAB — CULTURE, BLOOD (SINGLE)

## 2014-04-09 ENCOUNTER — Emergency Department: Payer: Self-pay | Admitting: Emergency Medicine

## 2014-04-09 LAB — COMPREHENSIVE METABOLIC PANEL
ALT: 24 U/L
ANION GAP: 11 (ref 7–16)
AST: 31 U/L (ref 15–37)
Albumin: 3.3 g/dL — ABNORMAL LOW (ref 3.4–5.0)
Alkaline Phosphatase: 136 U/L — ABNORMAL HIGH
BILIRUBIN TOTAL: 0.3 mg/dL (ref 0.2–1.0)
BUN: 28 mg/dL — AB (ref 7–18)
Calcium, Total: 7.4 mg/dL — ABNORMAL LOW (ref 8.5–10.1)
Chloride: 102 mmol/L (ref 98–107)
Co2: 27 mmol/L (ref 21–32)
Creatinine: 6.16 mg/dL — ABNORMAL HIGH (ref 0.60–1.30)
EGFR (African American): 11 — ABNORMAL LOW
EGFR (Non-African Amer.): 9 — ABNORMAL LOW
Glucose: 240 mg/dL — ABNORMAL HIGH (ref 65–99)
Osmolality: 293 (ref 275–301)
POTASSIUM: 3.7 mmol/L (ref 3.5–5.1)
SODIUM: 140 mmol/L (ref 136–145)
Total Protein: 7.6 g/dL (ref 6.4–8.2)

## 2014-04-09 LAB — CBC
HCT: 37.2 % — ABNORMAL LOW (ref 40.0–52.0)
HGB: 11.7 g/dL — ABNORMAL LOW (ref 13.0–18.0)
MCH: 30 pg (ref 26.0–34.0)
MCHC: 31.3 g/dL — AB (ref 32.0–36.0)
MCV: 96 fL (ref 80–100)
Platelet: 172 10*3/uL (ref 150–440)
RBC: 3.89 10*6/uL — ABNORMAL LOW (ref 4.40–5.90)
RDW: 16.3 % — ABNORMAL HIGH (ref 11.5–14.5)
WBC: 6.9 10*3/uL (ref 3.8–10.6)

## 2014-04-09 LAB — ACETAMINOPHEN LEVEL: Acetaminophen: <2 ug/mL

## 2014-04-09 LAB — TSH: Thyroid Stimulating Horm: 1.13 u[IU]/mL

## 2014-04-09 LAB — SALICYLATE LEVEL

## 2014-04-09 LAB — ETHANOL

## 2014-07-17 LAB — LACTIC ACID, PLASMA: LACTIC ACID, VENOUS: 2.5 mmol/L — AB

## 2014-07-17 LAB — COMPREHENSIVE METABOLIC PANEL
ALBUMIN: 3.4 g/dL — AB
ALT: 20 U/L
ANION GAP: 18 — AB (ref 7–16)
Alkaline Phosphatase: 131 U/L — ABNORMAL HIGH
BILIRUBIN TOTAL: 0.5 mg/dL
BUN: 72 mg/dL — ABNORMAL HIGH
CREATININE: 9.37 mg/dL — AB
Calcium, Total: 7.4 mg/dL — ABNORMAL LOW
Chloride: 100 mmol/L — ABNORMAL LOW
Co2: 20 mmol/L — ABNORMAL LOW
EGFR (African American): 5 — ABNORMAL LOW
EGFR (Non-African Amer.): 5 — ABNORMAL LOW
GLUCOSE: 203 mg/dL — AB
POTASSIUM: 4.9 mmol/L
SGOT(AST): 25 U/L
SODIUM: 138 mmol/L
Total Protein: 6.7 g/dL

## 2014-07-17 LAB — CBC WITH DIFFERENTIAL/PLATELET
Basophil #: 0 10*3/uL (ref 0.0–0.1)
Basophil %: 0.3 %
Eosinophil #: 0 10*3/uL (ref 0.0–0.7)
Eosinophil %: 0.2 %
HCT: 35.6 % — AB (ref 40.0–52.0)
HGB: 11.6 g/dL — AB (ref 13.0–18.0)
LYMPHS ABS: 0.2 10*3/uL — AB (ref 1.0–3.6)
Lymphocyte %: 4.6 %
MCH: 30.6 pg (ref 26.0–34.0)
MCHC: 32.6 g/dL (ref 32.0–36.0)
MCV: 94 fL (ref 80–100)
MONO ABS: 0.3 x10 3/mm (ref 0.2–1.0)
Monocyte %: 8 %
NEUTROS ABS: 3.1 10*3/uL (ref 1.4–6.5)
Neutrophil %: 86.9 %
Platelet: 169 10*3/uL (ref 150–440)
RBC: 3.79 10*6/uL — ABNORMAL LOW (ref 4.40–5.90)
RDW: 14.7 % — ABNORMAL HIGH (ref 11.5–14.5)
WBC: 3.6 10*3/uL — ABNORMAL LOW (ref 3.8–10.6)

## 2014-07-17 LAB — TROPONIN I: TROPONIN-I: 0.25 ng/mL — AB

## 2014-07-18 ENCOUNTER — Inpatient Hospital Stay
Admit: 2014-07-18 | Disposition: A | Discharge status: Home / Self Care | Payer: Self-pay | Attending: Internal Medicine | Admitting: Internal Medicine

## 2014-07-18 LAB — OCCULT BLOOD X 1 CARD TO LAB, STOOL: OCCULT BLOOD, FECES: NEGATIVE

## 2014-07-18 LAB — LACTIC ACID, PLASMA
LACTIC ACID, VENOUS: 2.1 mmol/L — AB
Lactic Acid, Venous: 1.9 mmol/L

## 2014-07-18 LAB — PHOSPHORUS: PHOSPHORUS: 3.4 mg/dL

## 2014-07-18 LAB — TROPONIN I
TROPONIN-I: 0.23 ng/mL — AB
TROPONIN-I: 0.24 ng/mL — AB

## 2014-07-19 LAB — CBC WITH DIFFERENTIAL/PLATELET
Basophil #: 0.1 10*3/uL (ref 0.0–0.1)
Basophil %: 0.6 %
EOS PCT: 0.3 %
Eosinophil #: 0 10*3/uL (ref 0.0–0.7)
HCT: 28.6 % — ABNORMAL LOW (ref 40.0–52.0)
HGB: 9.2 g/dL — ABNORMAL LOW (ref 13.0–18.0)
Lymphocyte #: 0.5 10*3/uL — ABNORMAL LOW (ref 1.0–3.6)
Lymphocyte %: 5.4 %
MCH: 30.2 pg (ref 26.0–34.0)
MCHC: 32.2 g/dL (ref 32.0–36.0)
MCV: 94 fL (ref 80–100)
MONOS PCT: 8 %
Monocyte #: 0.8 x10 3/mm (ref 0.2–1.0)
NEUTROS ABS: 8.6 10*3/uL — AB (ref 1.4–6.5)
Neutrophil %: 85.7 %
Platelet: 153 10*3/uL (ref 150–440)
RBC: 3.05 10*6/uL — AB (ref 4.40–5.90)
RDW: 14.9 % — ABNORMAL HIGH (ref 11.5–14.5)
WBC: 10.1 10*3/uL (ref 3.8–10.6)

## 2014-07-19 LAB — BASIC METABOLIC PANEL
Anion Gap: 11 (ref 7–16)
BUN: 57 mg/dL — AB
CHLORIDE: 99 mmol/L — AB
Calcium, Total: 7.4 mg/dL — ABNORMAL LOW
Co2: 26 mmol/L
Creatinine: 7.13 mg/dL — ABNORMAL HIGH
EGFR (African American): 8 — ABNORMAL LOW
GFR CALC NON AF AMER: 6 — AB
GLUCOSE: 170 mg/dL — AB
Potassium: 3.9 mmol/L
Sodium: 136 mmol/L

## 2014-07-20 LAB — RENAL FUNCTION PANEL
ALBUMIN: 2.6 g/dL — AB
Anion Gap: 14 (ref 7–16)
BUN: 73 mg/dL — AB
CALCIUM: 7.7 mg/dL — AB
CHLORIDE: 100 mmol/L — AB
CO2: 25 mmol/L
Creatinine: 8.97 mg/dL — ABNORMAL HIGH
EGFR (African American): 6 — ABNORMAL LOW
EGFR (Non-African Amer.): 5 — ABNORMAL LOW
GLUCOSE: 214 mg/dL — AB
PHOSPHORUS: 2.2 mg/dL — AB
Potassium: 4.4 mmol/L
Sodium: 139 mmol/L

## 2014-07-20 LAB — CBC WITH DIFFERENTIAL/PLATELET
Basophil #: 0 10*3/uL (ref 0.0–0.1)
Basophil %: 0.3 %
Eosinophil #: 0.1 10*3/uL (ref 0.0–0.7)
Eosinophil %: 1 %
HCT: 28.4 % — AB (ref 40.0–52.0)
HGB: 9.1 g/dL — AB (ref 13.0–18.0)
LYMPHS PCT: 4.9 %
Lymphocyte #: 0.4 10*3/uL — ABNORMAL LOW (ref 1.0–3.6)
MCH: 30 pg (ref 26.0–34.0)
MCHC: 32 g/dL (ref 32.0–36.0)
MCV: 94 fL (ref 80–100)
Monocyte #: 0.5 x10 3/mm (ref 0.2–1.0)
Monocyte %: 5.6 %
Neutrophil #: 7.8 10*3/uL — ABNORMAL HIGH (ref 1.4–6.5)
Neutrophil %: 88.2 %
Platelet: 169 10*3/uL (ref 150–440)
RBC: 3.03 10*6/uL — AB (ref 4.40–5.90)
RDW: 14.7 % — ABNORMAL HIGH (ref 11.5–14.5)
WBC: 8.8 10*3/uL (ref 3.8–10.6)

## 2014-07-22 LAB — CBC WITH DIFFERENTIAL/PLATELET
BASOS ABS: 0.1 10*3/uL (ref 0.0–0.1)
BASOS PCT: 0.6 %
EOS PCT: 1.6 %
Eosinophil #: 0.1 10*3/uL (ref 0.0–0.7)
HCT: 31.5 % — ABNORMAL LOW (ref 40.0–52.0)
HGB: 10.1 g/dL — AB (ref 13.0–18.0)
Lymphocyte #: 0.6 10*3/uL — ABNORMAL LOW (ref 1.0–3.6)
Lymphocyte %: 7 %
MCH: 30.5 pg (ref 26.0–34.0)
MCHC: 32.1 g/dL (ref 32.0–36.0)
MCV: 95 fL (ref 80–100)
Monocyte #: 0.6 x10 3/mm (ref 0.2–1.0)
Monocyte %: 7.2 %
NEUTROS PCT: 83.6 %
Neutrophil #: 6.6 10*3/uL — ABNORMAL HIGH (ref 1.4–6.5)
Platelet: 207 10*3/uL (ref 150–440)
RBC: 3.31 10*6/uL — AB (ref 4.40–5.90)
RDW: 15.4 % — ABNORMAL HIGH (ref 11.5–14.5)
WBC: 8 10*3/uL (ref 3.8–10.6)

## 2014-07-22 LAB — COMPREHENSIVE METABOLIC PANEL
ANION GAP: 13 (ref 7–16)
Albumin: 3.1 g/dL — ABNORMAL LOW
Alkaline Phosphatase: 100 U/L
BUN: 46 mg/dL — ABNORMAL HIGH
Bilirubin,Total: 0.3 mg/dL
CALCIUM: 8 mg/dL — AB
CO2: 23 mmol/L
CREATININE: 8.16 mg/dL — AB
Chloride: 104 mmol/L
EGFR (African American): 6 — ABNORMAL LOW
GFR CALC NON AF AMER: 6 — AB
GLUCOSE: 118 mg/dL — AB
Potassium: 4.5 mmol/L
SGOT(AST): 30 U/L
SGPT (ALT): 20 U/L
Sodium: 140 mmol/L
Total Protein: 6.7 g/dL

## 2014-07-22 LAB — PROTIME-INR
INR: 0.9
PROTHROMBIN TIME: 12.8 s

## 2014-07-22 LAB — CULTURE, BLOOD (SINGLE)

## 2014-07-22 LAB — MAGNESIUM: MAGNESIUM: 1.8 mg/dL

## 2014-07-22 LAB — POTASSIUM: POTASSIUM: 4.6 mmol/L

## 2014-07-22 LAB — APTT: Activated PTT: 25.2 secs (ref 23.6–35.9)

## 2014-07-23 ENCOUNTER — Inpatient Hospital Stay
Admit: 2014-07-23 | Disposition: A | Discharge status: Skilled Nursing Facility | Payer: Self-pay | Attending: Internal Medicine | Admitting: Internal Medicine

## 2014-07-23 LAB — PHOSPHORUS: Phosphorus: 1.8 mg/dL — ABNORMAL LOW

## 2014-07-25 LAB — RENAL FUNCTION PANEL
Albumin: 2.9 g/dL — ABNORMAL LOW
Anion Gap: 11 (ref 7–16)
BUN: 41 mg/dL — ABNORMAL HIGH
CREATININE: 7.76 mg/dL — AB
Calcium, Total: 7.9 mg/dL — ABNORMAL LOW
Chloride: 99 mmol/L — ABNORMAL LOW
Co2: 28 mmol/L
GFR CALC AF AMER: 7 — AB
GFR CALC NON AF AMER: 6 — AB
Glucose: 221 mg/dL — ABNORMAL HIGH
POTASSIUM: 4.5 mmol/L
Phosphorus: 1.6 mg/dL — ABNORMAL LOW
Sodium: 138 mmol/L

## 2014-07-26 LAB — PLATELET COUNT: Platelet: 159 10*3/uL (ref 150–440)

## 2014-07-27 LAB — CBC WITH DIFFERENTIAL/PLATELET
BASOS PCT: 0.6 %
Basophil #: 0 10*3/uL (ref 0.0–0.1)
EOS PCT: 1 %
Eosinophil #: 0.1 10*3/uL (ref 0.0–0.7)
HCT: 26.4 % — ABNORMAL LOW (ref 40.0–52.0)
HGB: 8.4 g/dL — AB (ref 13.0–18.0)
LYMPHS PCT: 9.6 %
Lymphocyte #: 0.7 10*3/uL — ABNORMAL LOW (ref 1.0–3.6)
MCH: 30.2 pg (ref 26.0–34.0)
MCHC: 31.7 g/dL — ABNORMAL LOW (ref 32.0–36.0)
MCV: 95 fL (ref 80–100)
MONOS PCT: 7.4 %
Monocyte #: 0.5 x10 3/mm (ref 0.2–1.0)
Neutrophil #: 5.5 10*3/uL (ref 1.4–6.5)
Neutrophil %: 81.4 %
PLATELETS: 197 10*3/uL (ref 150–440)
RBC: 2.77 10*6/uL — ABNORMAL LOW (ref 4.40–5.90)
RDW: 15.4 % — ABNORMAL HIGH (ref 11.5–14.5)
WBC: 6.8 10*3/uL (ref 3.8–10.6)

## 2014-07-27 LAB — BASIC METABOLIC PANEL
Anion Gap: 9 (ref 7–16)
BUN: 38 mg/dL — AB
CHLORIDE: 100 mmol/L — AB
CREATININE: 7.01 mg/dL — AB
Calcium, Total: 7.5 mg/dL — ABNORMAL LOW
Co2: 29 mmol/L
EGFR (African American): 8 — ABNORMAL LOW
EGFR (Non-African Amer.): 7 — ABNORMAL LOW
GLUCOSE: 150 mg/dL — AB
Potassium: 4.8 mmol/L
Sodium: 138 mmol/L

## 2014-07-27 LAB — PHOSPHORUS: Phosphorus: 2.8 mg/dL

## 2014-08-05 NOTE — Consult Note (Signed)
Chief Complaint:  Subjective/Chief Complaint seferal episodes of hematochezia this am.  patient denies abdominalpain or nausea, no rectal pain.  episodes of hypotension.   VITAL SIGNS/ANCILLARY NOTES: **Vital Signs.:   04-Dec-14 09:53  Vital Signs Type 15 min Post Blood Start Time  Temperature Temperature (F) 98.7  Celsius 37  Temperature Source oral  Pulse Pulse 70  Respirations Respirations 20  Systolic BP Systolic BP 540  Diastolic BP (mmHg) Diastolic BP (mmHg) 66  Mean BP 86  Pulse Ox % Pulse Ox % 92  Oxygen Delivery Room Air/ 21 %    98:11  Systolic BP Systolic BP 77  Diastolic BP (mmHg) Diastolic BP (mmHg) 53  Mean BP 61    11:31  Pulse Pulse 73  Systolic BP Systolic BP 97  Diastolic BP (mmHg) Diastolic BP (mmHg) 66  Mean BP 76  *Intake and Output.:   04-Dec-14 07:23  Stool  pt up to bathroom, large amount bright red blood in toilet    08:30  Stool  pt up to bathroom, large bright red bloody stool with blood clots    09:47  Stool  medium bright red jelly-like stool   Brief Assessment:  Respiratory normal resp effort  clear BS   Gastrointestinal details normal Soft  Nontender  Nondistended  Bowel sounds normal   Additional Physical Exam DRE, copious bright red blood, posterior midline anal canal lesion.   Lab Results: Routine Chem:  04-Dec-14 02:21   Result Comment TROPONIN - RESULTS VERIFIED BY REPEAT TESTING.  - PREVIOUS CALL:03/17/13 AT 2000.Marland KitchenTPL  Result(s) reported on 18 Mar 2013 at 03:03AM.    02:23   Result Comment CBC - CNL-VERIFIED ON SPECIMEN FROM WRONG TIME  - TEST RE-ORDERED  Result(s) reported on 18 Mar 2013 at 08:27AM.    06:28   Result Comment ck-mb - CANCEL - VERIFIED IN ERROR. TEST  - REORDERED, NOTIFIED ASHLEY,RN 12/4 0750  Result(s) reported on 18 Mar 2013 at 07:51AM.  Cardiac:  04-Dec-14 02:21   CPK-MB, Serum 3.6 (Result(s) reported on 18 Mar 2013 at 03:01AM.)  Troponin I  0.35 (0.00-0.05 0.05 ng/mL or less: NEGATIVE  Repeat testing  in 3-6 hrs  if clinically indicated. >0.05 ng/mL: POTENTIAL  MYOCARDIAL INJURY. Repeat  testing in 3-6 hrs if  clinically indicated. NOTE: An increase or decrease  of 30% or more on serial  testing suggests a  clinically important change)    06:28   CPK-MB, Serum 3.6 (Result(s) reported on 18 Mar 2013 at 07:49AM.)  CPK-MB, Serum -  Routine Hem:  04-Dec-14 02:23   WBC (CBC) -  RBC (CBC) -  Hemoglobin (CBC) -  Hematocrit (CBC) -  Platelet Count (CBC) -  MCV -  MCH -  MCHC -  RDW -  Neutrophil % -  Lymphocyte % -  Monocyte % -  Eosinophil % -  Basophil % -  Neutrophil # -  Lymphocyte # -  Monocyte # -  Eosinophil # -  Basophil # -  Bands -  Segmented Neutrophils -  Lymphocytes -  Variant Lymphocytes -  Monocytes -  Eosinophil -  Basophil -  Metamyelocyte -  Myelocyte -  Promyelocyte -  Blast-Like -  Other Cells -  NRBC -  Diff Comment 1 -  Diff Comment 2 -  Diff Comment 3 -  Diff Comment 4 -  Diff Comment 5 -  Diff Comment 6 -  Diff Comment 7 -  Diff Comment 8 -  Diff Comment 9 -  Diff Comment 10 - (Result(s) reported on 18 Mar 2013 at 08:27AM.)    07:35   WBC (CBC) 7.8  RBC (CBC)  2.77  Hemoglobin (CBC)  8.6  Hematocrit (CBC)  26.1  Platelet Count (CBC) 201  MCV 94  MCH 31.0  MCHC 32.8  RDW 13.6  Neutrophil % 77.4  Lymphocyte % 5.9  Monocyte % 15.8  Eosinophil % 0.6  Basophil % 0.3  Neutrophil # 6.0  Lymphocyte #  0.5  Monocyte #  1.2  Eosinophil # 0.0  Basophil # 0.0 (Result(s) reported on 18 Mar 2013 at 08:35AM.)   Radiology Results: Cardiology:    03-Dec-14 18:06, ECG  ECG interpretation   Atrial fibrillation with rapid ventricular response  Marked ST abnormality, possible inferior subendocardial injury  Abnormal ECG  No previous ECGs available  Confirmed by MORAYATI, SAM ([id number]) on 03/18/2013 11:33:37 AM    Overreader: Hipolito Bayley  CT:    02-Dec-14 05:23, CT Abdomen and Pelvis With Contrast  CT Abdomen and Pelvis With  Contrast   REASON FOR EXAM:    (1) pain and rectal bleeding; (2) pain and rectal   bleeding  COMMENTS:   May transport without cardiac monitor    PROCEDURE: CT  - CT ABDOMEN / PELVIS  W  - Mar 16 2013  5:23AM     CLINICAL DATA:  Abdominal pain, rectal bleeding    EXAM:  CT ABDOMEN AND PELVIS WITH CONTRAST    TECHNIQUE:  Multidetector CT imaging of the abdomen and pelvis was performed  using the standard protocol following bolus administration of  intravenous contrast.  CONTRAST:  100 cc Isovue-300    COMPARISON:  None.    FINDINGS:  Upper normal heart size. Coronary artery and aortic valvular  calcifications. Trace pericardial fluid versus thickening. Mild  dependent opacities, favor atelectasis and/or scarring. Small hiatal  hernia. Calcified granulomaright hilum.    Hepatic and splenic granuloma.    Cholelithiasis. No pericholecystic fluid or fat stranding. No  biliary ductal dilatation. No appreciable pancreatic or adrenal  abnormality.  Renal atrophy. There is an upper pole too small to further  characterize hypodensity medially on image 40/88 series 2 and an  isodense lesion versus pseudo lesion arising from the lower pole a  kidney laterally measuring 1.4 cm. Too small to further characterize  hypodensity on the left. No hydroureteronephrosis.    Nonspecific colonic wall thickening. Colonic diverticulosis. No  overt colitis or acute diverticulitis. Normal appendix. Small bowel  loops are of normal course and caliber. Small hiatal hernia.  Duodenal diverticulum, periampullary. No free intraperitoneal air or  fluid. No lymphadenopathy.    Right femoral central venous catheter with tip at the internal and  external right iliac vein confluence.  Decompressed bladder with nonspecific wall thickening. Prostate  gland measures 4.1 cm transverse diameter.    Multilevel degenerative changes. No acute osseous finding.  Osteopenia.     IMPRESSION:  Nonspecific  circumferential rectal wall thickening. Proctitis or  mass not excluded.    Colonic diverticulosis without CT evidence for acute diverticulitis.    1.4 cm lower pole right renal mass is questioned. Recommend a renal  MRI.  Electronically Signed    By: Carlos Levering M.D.    On: 03/16/2013 05:47         Verified By: Tommi Rumps, M.D.,   Assessment/Plan:  Assessment/Plan:  Assessment 1) hematochezia-from appearance, seems very low in the colon.  several episodes, bright red, intermittant hypotension.  2) h/o esrd, partial colon resection for polyp, h/o CVA, hypertension, prostate ca with radiation treatment.   Plan 1) urgent transfer to CCU, serial hgb, transfuse as needed, will proceed with lower scope once stable.   Electronic Signatures: Loistine Simas (MD)  (Signed 04-Dec-14 11:55)  Authored: Chief Complaint, VITAL SIGNS/ANCILLARY NOTES, Brief Assessment, Lab Results, Radiology Results, Assessment/Plan   Last Updated: 04-Dec-14 11:55 by Loistine Simas (MD)

## 2014-08-05 NOTE — H&P (Signed)
PATIENT NAME:  Michael Acosta, SULT MR#:  [id number] DATE OF BIRTH:  [birth year]  DATE OF ADMISSION:  03/16/2013  PRIMARY CARE PHYSICIAN: Nonlocal.  REFERRING PHYSICIAN: Dr. Lovena Le.  CHIEF COMPLAINT: Rectal bleeding.  HISTORY OF PRESENT ILLNESS: The patient is an 81 year old African American male with past medical history of end-stage renal disease on hemodialysis Monday, Wednesday, and Friday, diabetes mellitus, hypertension, stroke 3 times, and prostate cancer who is presenting to the ER with the chief complaint of lower GI bleed. He is reporting that he is passing bright red blood per rectum since yesterday with bowel movement, feeling weak, having generalized weakness for the past few days. Denies any dizziness or loss of consciousness. No abdominal pain, nausea, or vomiting. Denies any fevers. No similar complaints in the past. CT of the abdomen has revealed proctitis and possible mass. The hospitalist team is called to admit the patient. During my examination, the patient is resting comfortably. Family members are at bedside. Denies any abdominal pain. He denies any hematemesis.  PAST MEDICAL HISTORY: End-stage renal disease on hemodialysis on Monday, Wednesday, and Friday, last dialysis was done on Monday, diabetes mellitus, hypertension, stroke x3, and prostate cancer.   PAST SURGICAL HISTORY: Partial colon resection for polyps.   ALLERGIES: ASPIRIN AND PENICILLIN.   PSYCHOSOCIAL HISTORY: Lives at home with spouse. Denies smoking, alcohol, or illicit drug use.   FAMILY HISTORY: Dad had cancer of unknown cause.   HOME MEDICATIONS: Not updated. Home medications need to be reconciled.   REVIEW OF SYSTEMS:  CONSTITUTIONAL: Denies any fever, but complaining of fatigue and weakness.  EYES: Denies blurry vision, double vision.  EARS, NOSE, THROAT: No epistaxis, discharge.  PULMONARY: Denies cough, COPD. CARDIOVASCULAR: No chest pain or palpitations.  GASTROINTESTINAL: Denies nausea,  vomiting. Having lower GI bleed with bright red blood per rectum. Denies abdominal pain.  GENITOURINARY: No dysuria or hematuria. No hernias. ENDOCRINE: Denies polyuria, nocturia, or thyroid problems. Has diabetes mellitus.  HEMATOLOGIC AND LYMPHATIC: Has chronic anemia from end-stage renal disease. No easy bruising, bleeding.  INTEGUMENTARY: No acne, rash, lesions.  MUSCULOSKELETAL: No joint pain in neck and back. NEUROLOGIC: No vertigo, ataxia. Had 3 strokes in the past.  PSYCHIATRIC: No ADD or OCD.   PHYSICAL EXAMINATION: VITAL SIGNS: Temperature 99.6, pulse 85, respirations 18, blood pressure 113/71, and pulse ox 98% on 2 liters.  GENERAL APPEARANCE: Not in acute distress, moderately built and nourished.  HEENT: Normocephalic, atraumatic. Pupils are equal and reacting to light and accommodation. No scleral icterus. No conjunctival injection. No sinus tenderness. Moist mucous membranes.  NECK: Supple. No JVD or thyromegaly.  LUNGS: Clear to auscultation bilaterally. No accessory muscle usage. No anterior chest wall tenderness on palpation.  CARDIAC: S1 and S2 normal. Regular rate and rhythm. No murmurs. GASTROINTESTINAL: Soft. Bowel sounds are positive in all 4 quadrants. Nontender, nondistended. No hepatosplenomegaly. No masses felt.  NEUROLOGIC: Awake, alert, and oriented. Sensory is intact. He has generalized weakness. Ambulates with the help of a cane since his last CVA.  PSYCHIATRIC: Normal mood and affect.  MUSCULOSKELETAL: No joint effusion, tenderness, or erythema.  SKIN: AV fistula in the left upper extremity.   LABORATORY AND DIAGNOSTICS: CAT scan of the abdomen and pelvis with contrast has revealed nonspecific circumferential rectal wall thickening. Proctitis or mass not excluded. Colonic diverticulosis without CT evidence for acute diverticulitis. A 1.4 cm lower pole right renal mass is questioned. Recommended a renal MRI.   Chest x-ray, portable technique: Mild lung base  opacity, favors atelectasis. Mild  interstitial prominence.   Glucose 330, BUN 30, creatinine 5.67, sodium 135, potassium 3.8, chloride 198, CO2 32, GFR 10, anion gap 5, serum osmolality 289, calcium 8.5. LFTs: Albumin is low at 2.9. The rest of the LFTs are normal. WBC 6.3, hemoglobin 8.1, hematocrit 24.9, platelets 182. The patient is O positive, antibody negative. PT 14.4. INR 1.1. PTT 34.  ASSESSMENT AND PLAN: An 81 year old African American male presenting to the ER with the chief complaint of bright red blood per rectum. He will be admitted with the following assessment and plan:  1.  Lower gastrointestinal bleed, probably from proctitis or mass. We will keep him n.p.o., provide IV fluids and proton pump inhibitor. Gastroenterology consult is placed. Monitor hemoglobin and hematocrit. Type and screen the blood.  5.  End-stage renal disease. On hemodialysis. Nephrology consult is placed to continue his dialysis on Monday, Wednesday, and Friday.  6.  Possible renal mass. Radiology is recommending MRI of the kidney. Plan is per rounding physician's discretion.  7.  Diabetes mellitus. The patient will be on sliding scale insulin. Home medicine needs to be reconciled and updated.  8.  Hypertension. Blood pressure is on the low-normal side. We will hold off on antihypertensive medication.  9.  Previous history of stroke. Will hold off on the aspirin as the patient has lower gastrointestinal bleed.  10.  Will provide him gastrointestinal prophylaxis with Protonix, deep vein thrombosis prophylaxis with SCDs.   He is FULL code. Daughter is medical power of attorney. Diagnosis and plan of care was discussed in detail with the patient and his wife at bedside. They all express their understanding of the plan.   TOTAL TIME SPENT ON ADMISSION: 45 minutes.  ____________________________ Nicholes Mango, MD ag:sb D: 03/16/2013 06:52:23 ET T: 03/16/2013 07:19:32 ET JOB#: [id number]  cc: Nicholes Mango, MD,  <Dictator> Primary Care Physician Nicholes Mango MD ELECTRONICALLY SIGNED 03/17/2013 7:29

## 2014-08-05 NOTE — Consult Note (Signed)
Chief Complaint:  Subjective/Chief Complaint patietn seen after return from dialysis, currently in new AF, variable rate under 130.  denies abdominal or chest pain no nausea currently though did trrow up small amount of broth he just ate.  one small bm with bright red blood noted.   VITAL SIGNS/ANCILLARY NOTES: **Vital Signs.:   03-Dec-14 17:10  Vital Signs Type Post Dialysis  Temperature Temperature (F) 98.1  Celsius 36.7  Pulse Pulse 100  Respirations Respirations 22  Systolic BP Systolic BP 254  Diastolic BP (mmHg) Diastolic BP (mmHg) 73  Mean BP 88  Pulse Ox % Pulse Ox % 95  Pulse Ox Activity Level  At rest  Oxygen Delivery Room Air/ 21 %    18:10  Pulse Pulse 122   Brief Assessment:  Cardiac Irregular   Respiratory clear BS   Gastrointestinal details normal Soft  Nontender  Nondistended  Bowel sounds normal   Lab Results: Routine Chem:  03-Dec-14 13:00   Phosphorus, Serum 4.0 (Result(s) reported on 17 Mar 2013 at 02:01PM.)  Glucose, Serum  151  BUN  46  Creatinine (comp)  8.74  Potassium, Serum 4.0  Chloride, Serum  93  CO2, Serum 28  Calcium (Total), Serum  7.8  Anion Gap 9  Osmolality (calc) 276  eGFR (African American)  6  eGFR (Non-African American)  5 (eGFR values <41m/min/1.73 m2 may be an indication of chronic kidney disease (CKD). Calculated eGFR is useful in patients with stable renal function. The eGFR calculation will not be reliable in acutely ill patients when serum creatinine is changing rapidly. It is not useful in  patients on dialysis. The eGFR calculation may not be applicable to patients at the low and high extremes of body sizes, pregnant women, and vegetarians.)  Result Comment creatinine - RESULTS VERIFIED BY REPEAT TESTING.  Result(s) reported on 17 Mar 2013 at 01:20PM.  Routine Coag:  02-Dec-14 00:49   INR 1.1 (INR reference interval applies to patients on anticoagulant therapy. A single INR therapeutic range for coumarins is not  optimal for all indications; however, the suggested range for most indications is 2.0 - 3.0. Exceptions to the INR Reference Range may include: Prosthetic heart valves, acute myocardial infarction, prevention of myocardial infarction, and combinations of aspirin and anticoagulant. The need for a higher or lower target INR must be assessed individually. Reference: The Pharmacology and Management of the Vitamin K  antagonists: the seventh ACCP Conference on Antithrombotic and Thrombolytic Therapy. CDIYME.1583Sept:126 (3suppl): [id number] A HCT value >55% may artifactually increase the PT.  In one study,  the increase was an average of 25%. Reference:  "Effect on Routine and Special Coagulation Testing Values of Citrate Anticoagulant Adjustment in Patients with High HCT Values." American Journal of Clinical Pathology 2006;126:400-405.)  Routine Hem:  03-Dec-14 13:00   WBC (CBC) 6.5  RBC (CBC)  2.56  Hemoglobin (CBC)  7.9  Hematocrit (CBC)  24.0  Platelet Count (CBC) 183  MCV 94  MCH 30.9  MCHC 33.0  RDW 13.6  Neutrophil % 79.7  Lymphocyte % 4.9  Monocyte % 14.5  Eosinophil % 0.4  Basophil % 0.5  Neutrophil # 5.2  Lymphocyte #  0.3  Monocyte # 0.9  Eosinophil # 0.0  Basophil # 0.0 (Result(s) reported on 17 Mar 2013 at 01:20PM.)   Radiology Results: CT:    02-Dec-14 05:23, CT Abdomen and Pelvis With Contrast  CT Abdomen and Pelvis With Contrast   REASON FOR EXAM:    (1) pain and rectal  bleeding; (2) pain and rectal   bleeding  COMMENTS:   May transport without cardiac monitor    PROCEDURE: CT  - CT ABDOMEN / PELVIS  W  - Mar 16 2013  5:23AM     CLINICAL DATA:  Abdominal pain, rectal bleeding    EXAM:  CT ABDOMEN AND PELVIS WITH CONTRAST    TECHNIQUE:  Multidetector CT imaging of the abdomen and pelvis was performed  using the standard protocol following bolus administration of  intravenous contrast.  CONTRAST:  100 cc Isovue-300    COMPARISON:   None.    FINDINGS:  Upper normal heart size. Coronary artery and aortic valvular  calcifications. Trace pericardial fluid versus thickening. Mild  dependent opacities, favor atelectasis and/or scarring. Small hiatal  hernia. Calcified granulomaright hilum.    Hepatic and splenic granuloma.    Cholelithiasis. No pericholecystic fluid or fat stranding. No  biliary ductal dilatation. No appreciable pancreatic or adrenal  abnormality.  Renal atrophy. There is an upper pole too small to further  characterize hypodensity medially on image 40/88 series 2 and an  isodense lesion versus pseudo lesion arising from the lower pole a  kidney laterally measuring 1.4 cm. Too small to further characterize  hypodensity on the left. No hydroureteronephrosis.    Nonspecific colonic wall thickening. Colonic diverticulosis. No  overt colitis or acute diverticulitis. Normal appendix. Small bowel  loops are of normal course and caliber. Small hiatal hernia.  Duodenal diverticulum, periampullary. No free intraperitoneal air or  fluid. No lymphadenopathy.    Right femoral central venous catheter with tip at the internal and  external right iliac vein confluence.  Decompressed bladder with nonspecific wall thickening. Prostate  gland measures 4.1 cm transverse diameter.    Multilevel degenerative changes. No acute osseous finding.  Osteopenia.     IMPRESSION:  Nonspecific circumferential rectal wall thickening. Proctitis or  mass not excluded.    Colonic diverticulosis without CT evidence for acute diverticulitis.    1.4 cm lower pole right renal mass is questioned. Recommend a renal  MRI.  Electronically Signed    By: Carlos Levering M.D.    On: 03/16/2013 05:47         Verified By: Tommi Rumps, M.D.,   Assessment/Plan:  Assessment/Plan:  Assessment 1) rectal bleeding in the setting of known history of prostate ca and radiation treatment 2) h/o CKD/HD, likely ACD. 3) abnormal ct  with thickening of the rectal wall noticed 4) new atrial fibrillation.   Plan 1) plans for flexible sigmoidoscopy for tomorrow on hold pending further evaluation of new AF.  Again the bright red rectal bleeding is likely related to radiation related proctitis/angioectasias, though will require luminal evaluation pending information from hospital in [place].   Electronic Signatures: Loistine Simas (MD)  (Signed 03-Dec-14 18:24)  Authored: Chief Complaint, VITAL SIGNS/ANCILLARY NOTES, Brief Assessment, Lab Results, Radiology Results, Assessment/Plan   Last Updated: 03-Dec-14 18:24 by Loistine Simas (MD)

## 2014-08-05 NOTE — Consult Note (Signed)
Chief Complaint:  Subjective/Chief Complaint Case reviewed with anesthesia, noted are positive troponins and significant st abnormality, inferior.  Patient currently hemodynamically stable I have requested through IM cardiology consult, with anesthesia request as well for stat evaluation/risk stratification re anesthesia.  Continue serial hgb q 4 hour, transfuse as needed. Discussed with Dr Vianne Bulls. Following.   Electronic Signatures: Loistine Simas (MD)  (Signed 04-Dec-14 15:37)  Authored: Chief Complaint   Last Updated: 04-Dec-14 15:37 by Loistine Simas (MD)

## 2014-08-05 NOTE — Consult Note (Signed)
Chief Complaint:  Subjective/Chief Complaint pelase see Flex-sig results.  no active bleeding.  Improeesion likely low diverticular bleeding, however amny small avms c/w radiation proctitis.  no mass noted, but walls do appear fibrotic, possibly accounting for the ct appearance.  No evidence of bleeding coming from the colon more proximal than about 25 cm from the anal verge.  Continue current.  Will need to have repeat preceedure in a couple weeks, cautery of rectal lesions.   Clear liquids for now and continue for 2 days.  Following.   Electronic Signatures for Addendum Section:  Loistine Simas (MD) (Signed Addendum 05-Dec-14 16:46)  Dr Allen Norris following over the weekend.   Electronic Signatures: Loistine Simas (MD)  (Signed 05-Dec-14 08:42)  Authored: Chief Complaint   Last Updated: 05-Dec-14 16:46 by Loistine Simas (MD)

## 2014-08-05 NOTE — Consult Note (Signed)
Chief Complaint:  Subjective/Chief Complaint Please see full GI consult and brief consult note.  Patietn seen and examined, cahrt reviewed. Patietn admitted with weakness, found with h/o intermittant rectal bleeding and a trace heme positive stool.  Patient with ESRD, with HD on mwf.  Due for dialysis tomorrow.  Agree that bleeding is likely post radiation  vasculopathy, angiectasia in the rectum, accounting  for the chronicity and bright red nature, other etiologies are considered.  CT showing thickened rectal vaults .  Patietn with colonoscopy in [place] within the past 2 years.  Records ordered.  Recommend flexible sigmoidoscopy, however with dialysis tomorrow, will do on thursday pm, and will likely need mild sedation due to anxiety.  Continue serial hgb.  Following.   Electronic Signatures: Loistine Simas (MD)  (Signed 02-Dec-14 19:39)  Authored: Chief Complaint   Last Updated: 02-Dec-14 19:39 by Loistine Simas (MD)

## 2014-08-05 NOTE — Consult Note (Signed)
Chief Complaint:  Subjective/Chief Complaint Lower GI bleed. No complaints. Wants to go home. Hb stable.   Brief Assessment:  GEN well nourished, no acute distress   Respiratory normal resp effort  no use of accessory muscles   Additional Physical Exam Alert and orientated times 3   Lab Results: Routine Chem:  06-Dec-14 04:06   Result Comment LABS - This specimen was collected through an   - indwelling catheter or arterial line.  - A minimum of 74mls of blood was wasted prior    - to collecting the sample.  Interpret  - results with caution.  Result(s) reported on 20 Mar 2013 at 04:22AM.  Routine Hem:  06-Dec-14 04:06   WBC (CBC) 8.2  RBC (CBC)  2.62  Hemoglobin (CBC)  7.8  Hematocrit (CBC)  23.6  Platelet Count (CBC) 201  MCV 90  MCH 29.7  MCHC 32.9  RDW  15.2  Neutrophil % 83.0  Lymphocyte % 3.5  Monocyte % 12.9  Eosinophil % 0.3  Basophil % 0.3  Neutrophil #  6.8  Lymphocyte #  0.3  Monocyte #  1.1  Eosinophil # 0.0  Basophil # 0.0   Assessment/Plan:  Assessment/Plan:  Assessment Lower GI bleed.   Plan The patient has no reports of any further bleeding. Hb stable. Patient reports that he would like to go hame. Eating well. Will follow Hb.   Electronic Signatures: [name] (MD)  (Signed 06-Dec-14 08:55)  Authored: Chief Complaint, Brief Assessment, Lab Results, Assessment/Plan   Last Updated: 06-Dec-14 08:55 by [name] (MD)

## 2014-08-05 NOTE — Consult Note (Signed)
Brief Consult Note: Diagnosis: Elevated TNI, abn EKG.   Patient was seen by consultant.   Consult note dictated.   Recommend to proceed with surgery or procedure.   Orders entered.   Comments: Mr. Hnat is an 81yo BM with HTN, HLD, ESRD on HD, h/o CVA, h/o colon cancer who presented with rectal bleeding and anemia. EKG initally with afib with RVR, had ST changes inferolaterally, converted to NSR on own. Patient CP free at this time and denies jaw pain, arm pain, orthopnea. Will get echo to check LVEF and r/o wall motion abnormalities and EKG. Patient is low to moderate risk for planned endoscopic procedure.  Electronic Signatures: Angelica Ran (MD)   (Signed 05-Dec-14 08:41)  Co-Signer: Brief Consult Note Gabriel Rung A (PA-C)   (Signed 04-Dec-14 17:17)  Authored: Brief Consult Note  Last Updated: 05-Dec-14 08:41 by Angelica Ran (MD)

## 2014-08-05 NOTE — Consult Note (Signed)
Chief Complaint:  Subjective/Chief Complaint Patietn with one further episode of hematochezia after transfer to the unit.  hgb at 8.5, normal plt and pt.  Will proceed with flexible sigmoidoscopy.  I have discussed the risks benefits and complications of  flexible sigmoidoscopy to include not limited to bleeding infection preforation and sedation and he wishes to proceed.   Electronic Signatures: Loistine Simas (MD)  (Signed 04-Dec-14 15:20)  Authored: Chief Complaint   Last Updated: 04-Dec-14 15:20 by Loistine Simas (MD)

## 2014-08-05 NOTE — Consult Note (Signed)
PATIENT NAME:  Michael Acosta, PORTELL MR#:  [id number] DATE OF BIRTH:  [birth year]  DATE OF CONSULTATION:  03/18/2013  REFERRING PHYSICIAN: Pasty Arch, MD  CONSULTING PROVIDER:  Merla Riches, PA-C, dictating for Neoma Laming, MD  REASON FOR CONSULTATION: Abnormal EKG, elevated troponin.   HISTORY OF PRESENT ILLNESS: [name] is an 81 year old African American male with past medical history significant for hypertension, hyperlipidemia, diabetes mellitus, and end-stage renal disease on hemodialysis. The patient was admitted for rectal bleeding requiring a transfusion and during his admission was noted to have atrial fibrillation with rapid ventricular response which converted spontaneously to normal sinus rhythm.   The patient also developed elevated troponins, initially 0.17. The second one was 0.25, and the third was 0.35.   The patient denies any chest pains, chest pressure, jaw pain, arm pain, shortness of breath, wheezing and orthopnea. He does have intermittent coughing with some phlegm. Complains of leg soreness and intermittent edema.   Of note, the patient has continued to have active rectal bleeding and will need a flexible sigmoidoscopy with sedation. We were asked to provide cardiac clearance for this procedure.   PAST MEDICAL HISTORY:  1.  End-stage renal disease on hemodialysis on Monday, Wednesday, and Friday.  2.  Diabetes mellitus.  3.  Hypertension.  4.  CVA x3.  5.  Prostate cancer.  6.  Colon polyps.   PAST SURGICAL HISTORY:  1.  Partial colon resection for polyps.  2.  AV fistula for dialysis access.   ALLERGIES: ASPIRIN, BC POWDERS, AND PENICILLIN.   MEDICATIONS: The patient is not sure what medicines he takes at home. Does not have a list with him at this time.   FAMILY HISTORY: Father with hypertension and died of cancer of unknown cause.   SOCIAL HISTORY: The patient lives at home. He has never smoked. Denies any alcohol or illicit drug use.    REVIEW OF SYSTEMS: CONSTITUTIONAL: Denies any fever but has fatigue and weakness.  EYES: Denies any blurry vision.  ENT: No epistaxis and no discharge.  PULMONARY: Denies any shortness of breath. Does have cough.  CARDIOVASCULAR: Denies any chest pain, chest pressure, or palpitations.  GASTROINTESTINAL: Denies any abdominal pain, nausea or vomiting, but does have lower GI bleed with bright red blood per rectum, hemorrhoids, and stools.  EXTREMITIES: No cyanosis, clubbing, or edema noted.   PHYSICAL EXAMINATION:  GENERAL: A pleasant male who is not in any acute distress. Alert and oriented.  VITAL SIGNS: Temperature 99 degrees Fahrenheit. Heart rate is 82. Blood pressure 194/96. Oxygen saturation is 96% at rest.  HEENT: Head atraumatic, normocephalic.  EYES: Pupils are round. Conjunctivae pale, pink.  EARS AND NOSE: Normal to external inspection.  MOUTH: Dry mucous membranes.  NECK: Supple. Trachea is midline. There is no JVD. No carotid bruits.  LUNGS: Clear to auscultation. No adventitious breath sounds. No accessory muscle use.  CARDIOVASCULAR: Regular rate and rhythm and no murmurs, rubs, or gallops appreciated.  ABDOMEN: Nondistended. Bowel sounds present in all 4 quadrants. It is soft, nontender to  palpation.  EXTREMITIES: No cyanosis, clubbing, or edema.   DIAGNOSTIC DATA:   EKG on admission with atrial fibrillation with rapid ventricular response with inferolateral ST changes.   CT scan of the abdomen: Nonspecific circumferential rectal wall thickening, colonic diverticulosis without evidence for acute diverticulitis, 1.4 cm lower pole of the right kidney mass is questioned.   LABORATORY DATA: Glucose 108, BUN 15, creatinine 3.80, sodium 136, potassium 3.2, chloride 96. Estimated GFR  16, calcium 8.3. Magnesium is 1.3. Troponin I 0.17 followed by 0.25 followed by 0.35. White blood cell count 7.0, hemoglobin 8.4, hematocrit 25.7, platelet count 183,000. PT 13.9. INR 1.1, PTT  33.7.   ASSESSMENT AND PLAN: Abnormal EKG with elevated troponin. The patient's ST abnormalities were most noticeable when he was in atrial fibrillation. Will need to repeat EKG, check for any potential acute ST-T-wave changes. Elevated troponin could be secondary to demand ischemia as he has end-stage renal disease on hemodialysis and also has active gastrointestinal bleed. Denies any chest pressure or chest pain or significant shortness of breath. We will get echocardiogram to check his wall-motion abnormality. Stable at this time. It  is now in normal sinus rhythm in the 80s and is at low-to-moderate cardiac risk for planned endoscopic procedures under sedation. We can get further work-up such as stress testing on an outpatient basis.   We will continue to follow the patient with you. Please call us if needed.   ____________________________ Merla Riches, PA-C mam:np D: 03/18/2013 17:24:49 ET T: 03/18/2013 19:58:30 ET JOB#: [id number]  cc: Merla Riches, PA-C, <Dictator> Lollie Sails, MD Ahyan Kreeger A Taksh Hjort PA ELECTRONICALLY SIGNED 03/21/2013 17:35

## 2014-08-05 NOTE — Consult Note (Signed)
PATIENT NAME:  Michael Acosta, Michael Acosta MR#:  [id number] DATE OF BIRTH:  1929/10/20  DATE OF ADMISSION: 03/16/2013  DATE OF CONSULTATION:  03/16/2013  CONSULTING PHYSICIAN: Theodore Demark, NP.   REQUESTING PHYSICIAN:  GI consult was ordered by Dr. Margaretmary Eddy   REASON FOR CONSULTATION:  For evaluation of lower GI bleed and/or proctitis.  REPORT OF CONSULTATION: I appreciate consult for this 81 year old African-American man with history of CVA x 3 and ESRD with dialysis Monday, Wednesday, Friday; prostate cancer, treated with radiation, last in 2003. Admitted for rectal bleeding for evaluation of same. The patient and wife report that he has had BRPR intermittently over the last few years in varying amounts. Sometimes has issues with constipation, usually remedied by eating prunes or other fruit, and sometimes strains to defecate. He has had colonoscopies in the past. Wife states she thinks his last was last year, done in [place] at that hospital. Denies abdominal pain, dyspepsia, melena, hematochezia, problem swallowing, further GI complaints. Did develop some weakness during his dialysis treatment, which has since resolved. He and his wife attribute this to the procedure itself.   PAST MEDICAL HISTORY: ESRD, hemodialysis Monday, Wednesday, Friday. Diabetes, hypertension, stroke x 3. Prostate cancer with radiation therapy, last 2003. Partial colon resection for colon polyps 15 years ago.   ALLERGIES: ASPIRIN, PENICILLIN.  PSYCHOSOCIAL HISTORY: Lives at home with spouse. No smoking tobacco, no illicits.     FAMILY HISTORY: Father and sisters with some type of cancer, unknown which type.   HOME MEDICATIONS: Calcium acetate 667 mg 3 caps t.i.d., iron once daily, metoprolol 25 mg  tablet b.i.d., pravastatin 20 mg p.o. at bedtime. He is not on any anticoagulation at this time, per his and his wife's report.   REVIEW OF SYSTEMS: Ten systems reviewed. Significant for fatigue, improving weakness. Otherwise  unremarkable at this time. He has a shunt to the left upper extremity.  MOST RECENT LABS: Glucose 330, BUN 30, creatinine 5.67, sodium 135, potassium 3.8, albumin 2.9, total protein 6.8, total bilirubin 0.2, ALP 88, AST 21, ALT 10. WBC 6.3, hemoglobin 8.1 and 8.5, hematocrit 24.9 and 25.5. PT/INR 14.4/1.1. CT concerning for rectal wall thickening circumferentially concerning for mass versus proctitis.   MOST RECENT VITAL SIGNS: Temp 98.8, pulse 84, respiratory rate 20, blood pressure 156/74, SAO2 94% on room air.   PHYSICAL EXAMINATION: GENERAL:  A pleasant, well-appearing man in no acute distress.  HEENT: Normocephalic, atraumatic. Sclerae clear. Mucous membranes pink and moist.  NECK: Supple. No JVD or lymphadenopathy.  CHEST: Respirations eupneic. Lungs clear to auscultation and percussion. CARDIAC: S1, S2. Regular rate and rhythm. No murmurs, rubs, or gallops. There is a shunt to the left upper extremity, with a positive thrill. No appreciable peripheral edema.  ABDOMEN: Flat, soft, nontender, nondistended. No hepatosplenomegaly, guarding, peritoneal signs, rigidity or other abnormalities.  RECTAL: Do note some scarring around the perianal area, on the anal ring, there are a couple of roughened areas. Stool is brown, it is heme positive.  EXTREMITIES: Moves all extremities well x 4. Gait somewhat slow but reasonably steady. Sensation appears to be intact.  NEUROLOGIC: Alert, oriented x 3. Speech clear. Cranial nerves II through XII intact. No facial droop.  PSYCHIATRIC: Pleasant, cooperative, calm.  SKIN: Warm, dry, pink. No erythema, lesion or rash.   IMPRESSION AND PLAN: Intermittent bright red blood per rectum, abnormal CT scan. Would like colonoscopy reports from [place]. Differentials include radiation proctitis, anal outlet, other. Will likely need flexible sigmoidoscopy for evaluation. I did  discuss this with Dr. Gustavo Lah. We will plan for this tomorrow afternoon. He can have a clear  liquid diet tonight. He will need to be n.p.o. after midnight. Will need tap water enema around 1:00 tomorrow.   Thank you very much for this consult. These services were provided by Stephens November, MSN, [facility], in collaboration with Loistine Simas, MD, with whom I have discussed this patient in full.      ____________________________ Theodore Demark, NP chl:mr D: 03/16/2013 18:51:47 ET T: 03/16/2013 19:05:41 ET JOB#: [id number]  cc: Theodore Demark, NP, <Dictator> [place] SIGNED 03/22/2013 13:07

## 2014-08-05 NOTE — Consult Note (Signed)
Brief Consult Note: Diagnosis: Rectal bleeding.   Patient was seen by consultant.   Consult note dictated.   Comments: Appreciate consult for 81 y/o [place] American man with history of CVA x 3 and ESRD with dialysis MWF, and prostate cancer treated with radiation last in 2003, admitted for rectal bleeding, for eval of same. Pt and wife report that he has had brpr intermittently over the last few years in varying amounts. Sometimes has issues with constipation usually remedied by eating prunes or other fruits, and sometimes strains to defecate. Has had colonoscopies in past: wife states she thinks his last was last year, done in [place] at this hospital.  Deny abdominal pain dyspepsia, melena/hematochezia, problems swallowing, and further GI complaints. On exam abdomen in benign. Rectal exam with some scarring to the perianal area, minimally tender, stool brownish, trace heme postive. 2 roughened areas noted at anal ring. CT with concern for abnormal wall thickening in the rectum. Hgb stable. The dizziness he had at dialysis has resolved and he and his wife attribute that to the procedure itself Impression and plan: Intermittent brpr. Abnormal CT scan. Would like colonoscopy report from [place]. Differentials include radiation proctitis, anal outlet, other.  Will likely need flexible sigmoidoscopy for evaluation.  Electronic Signatures: Stephens November H (NP)  (Signed 02-Dec-14 18:45)  Authored: Brief Consult Note   Last Updated: 02-Dec-14 18:45 by Theodore Demark (NP)

## 2014-08-05 NOTE — Consult Note (Signed)
Chief Complaint:  Subjective/Chief Complaint Brief visit-small amount of clot at pad, dark, no fresh bleeding as this am. Hemodynamically stable.  No pain.  Cardiology seeing patient.  Will put on schedule for am as clinically feasible for flex sig.  Currently hypertensive.   VITAL SIGNS/ANCILLARY NOTES: **Vital Signs.:   04-Dec-14 16:14  Vital Signs Type Routine  Pulse Pulse 82  Systolic BP Systolic BP 803  Diastolic BP (mmHg) Diastolic BP (mmHg) 76  Mean BP 115  Pulse Ox % Pulse Ox % 96  Pulse Ox Activity Level  At rest  Oxygen Delivery Room Air/ 21 %  Pulse Ox Heart Rate 82  Telemetry pattern Cardiac Rhythm Normal sinus rhythm  *Intake and Output.:   04-Dec-14 14:35  Stool  moderate dark blood with clots   Lab Results: Routine Hem:  02-Dec-14 00:49   Hemoglobin (CBC)  8.1    09:33   Hemoglobin (CBC)  8.5 (Result(s) reported on 16 Mar 2013 at 10:01AM.)    16:50   Hemoglobin (CBC)  8.5 (Result(s) reported on 16 Mar 2013 at 05:14PM.)  03-Dec-14 13:00   Hemoglobin (CBC)  7.9    18:42   Hemoglobin (CBC)  8.7 (Result(s) reported on 17 Mar 2013 at 07:30PM.)  04-Dec-14 02:23   Hemoglobin (CBC) -    07:35   Hemoglobin (CBC)  8.6    14:13   Hemoglobin (CBC) -  Hemoglobin (CBC)  8.4  Hemoglobin (CBC) -   Assessment/Plan:  Assessment/Plan:  Assessment 1)  hematochezia-currently stable, one unit prbc transfused today.  On rectal exam there is possibility of anal fissure or other lower lesion.   2) new AF and ekg changes-awaiting cardiology consult.   Plan 1) will place on schedule for 730 am as clinically feasible, serial hgb, transfuse as needed.   I have discussed the risks benefits and complicatiosn of flexible sigmoidoscopy to include not limited to bleeding infection perofration and sedation and he wishes to proceed.   Electronic Signatures: Loistine Simas (MD)  (Signed 04-Dec-14 17:12)  Authored: Chief Complaint, VITAL SIGNS/ANCILLARY NOTES, Lab Results,  Assessment/Plan   Last Updated: 04-Dec-14 17:12 by Loistine Simas (MD)

## 2014-08-05 NOTE — Discharge Summary (Signed)
PATIENT NAME:  Michael Acosta, Michael Acosta MR#:  [id number] DATE OF BIRTH:  July 05, 1929  DATE OF ADMISSION:  03/16/2013 DATE OF DISCHARGE:  03/20/2013  REASON FOR ADMISSION: Rectal bleeding.   DISCHARGE DIAGNOSES: 1. Lower gastrointestinal bleeding due to diverticular bleed, status post flexible sigmoidoscopy showing multiple diverticula and angiectasis.  2. Paroxysmal atrial fibrillation.  3. End-stage renal disease.  4. Possible renal mass. Follow-up MRI outpatient, referral to urology 5. Diabetes.  6. Hypertension.  7. History of cerebrovascular accident.  8. History of breast cancer.  9. Elevated troponin, due to demand ischemia.   MEDICATIONS AT DISCHARGE:  1. Calcium acetate 3 capsules 3 times a day.  2. Metoprolol 25 mg take 5 tablets twice daily.  3. Pravastatin 20 mg once a day.  4. Iron 1000 mg in the evening.  5. Omeprazole 1 capsule once a day.  6. Levofloxacin 500 mg every 48 hours.   Follow-up with GI in 4 to 6 weeks, with PCP, Dr. Ronnald Collum in 1 to 2 weeks.   Follow-up CBC, BNP on  03/22/2013 at [facility] report to Dr. Candiss Norse or to Dr. Ronnald Collum.    HOSPITAL COURSE: This is a very nice 81 year old gentleman with a history of end-stage renal disease, diabetes, hypertension, atrial fibrillation, paroxysmal, who goes to dialysis on Monday, Wednesday and Friday. Presents to the ER with a chief complaint of lower gastrointestinal bleeding, reported passing bright red blood per rectum for the last 24 hours prior to admission and feeling really weak. The patient had a CT scan of the abdomen showing some proctitis and a possible mass for which he was admitted to the hospital.   PROBLEMS: 1. Lower gastrointestinal bleeding from proctitis or mass. The patient actually underwent a flex  sigmoidoscopy done by Dr. Gustavo Lah. That showed multiple nonbleeding colonic angiectasis, diverticulosis in the distal sigmoid colon and there was no fissures on the anal canal, and there was no masses found. There  was many dark clots from the bottom of the rectal vault to the anal canal ,  but not active bleeding. It was recommended the patient stay on clear liquid diet for a couple of days and then advance as tolerated to low residue. The patient starting to feel really good and wanted to be discharged home. He on the 5th and 6th he was on clear diet and we advanced it at the last day.  2. As far as his other medical problems. The patient had paroxysmal atrial fibrillation for what his dose of metoprolol was adjusted in the hospital and now he is taking 125 mg twice daily. The patient was seen by cardiology, Dr. Nada Boozer for this and overall he is discharged with a heart rate below 100.   3. End-stage renal disease. Continue hemodialysis on Monday, Wednesday, Friday.  4. CT scan showed possible renal mass, for what is suggested to have an MRI. The patient is going to be discharged for which the MRI needs to be scheduled outpatient, but for what we are going to recommend him to have a followup with urology. Urology appointment needs to be scheduled. There were orders for scheduling the appointment at discharge, so the patient can follow up and get his MRI.  5. Abnormal chest x-ray showed an infiltrate that looked like the beginnings of pneumonia for which the patient was been given Levaquin. We will continue Levaquin for six more days at discharge. The patient had some cough but no significant sputum. Due to his risk factors we decided to cover him  for community-acquired pneumonia.  6. Diabetes: Continue home medications.  7. Hypertension. The patient was put on the same previous medications and metoprolol was increased due to his heart rate.  8. The patient had a CVA in the past, but no residual symptoms. We are going to hold aspirin for now and recommended visit with his primary care physician and GI before restarting aspirin back again.   TIME SPENT: I spent about 45 minutes with this patient.    ____________________________ Louisa Sink, MD rsg:sg D: 03/21/2013 06:57:00 ET T: 03/21/2013 08:11:52 ET JOB#: [id number]  cc: Rutherford Sink, MD, <Dictator> Lovell Roe America Brown MD ELECTRONICALLY SIGNED 03/25/2013 18:11

## 2014-08-05 NOTE — Consult Note (Signed)
Chief Complaint:  Subjective/Chief Complaint no nausea overnight, small amount of dark clotted material overnight.  no abdominal pain.   VITAL SIGNS/ANCILLARY NOTES: **Vital Signs.:   05-Dec-14 01:00  Temperature Temperature (F) 98.3    06:00  Vital Signs Type Routine  Pulse Pulse 108  Respirations Respirations 16  Systolic BP Systolic BP 700  Diastolic BP (mmHg) Diastolic BP (mmHg) 55  Mean BP 95  Pulse Ox % Pulse Ox % 98  Pulse Ox Activity Level  At rest  Oxygen Delivery Room Air/ 21 %  Pulse Ox Heart Rate 108   Brief Assessment:  Cardiac Irregular   Respiratory clear BS   Gastrointestinal details normal Soft  Nontender  Nondistended  Bowel sounds normal   Lab Results: Routine Hem:  02-Dec-14 00:49   Hemoglobin (CBC)  8.1    09:33   Hemoglobin (CBC)  8.5 (Result(s) reported on 16 Mar 2013 at 10:01AM.)    16:50   Hemoglobin (CBC)  8.5 (Result(s) reported on 16 Mar 2013 at 05:14PM.)  03-Dec-14 13:00   Hemoglobin (CBC)  7.9    18:42   Hemoglobin (CBC)  8.7 (Result(s) reported on 17 Mar 2013 at 07:30PM.)  04-Dec-14 02:23   Hemoglobin (CBC) -    07:35   Hemoglobin (CBC)  8.6    14:13   Hemoglobin (CBC) -  Hemoglobin (CBC)  8.4  Hemoglobin (CBC) -    20:00   Hemoglobin (CBC)  8.6 (Result(s) reported on 18 Mar 2013 at 08:26PM.)  05-Dec-14 00:20   Hemoglobin (CBC)  8.8 (Result(s) reported on 19 Mar 2013 at 12:36AM.)    04:22   WBC (CBC) 7.4  RBC (CBC)  2.80  Hemoglobin (CBC)  8.7  Hematocrit (CBC)  25.6  Platelet Count (CBC) 200  MCV 91  MCH 31.1  MCHC 34.0  RDW  15.6  Neutrophil % 79.6  Lymphocyte % 4.5  Monocyte % 15.0  Eosinophil % 0.5  Basophil % 0.4  Neutrophil # 5.9  Lymphocyte #  0.3  Monocyte #  1.1  Eosinophil # 0.0  Basophil # 0.0 (Result(s) reported on 19 Mar 2013 at 04:44AM.)   Assessment/Plan:  Assessment/Plan:  Assessment 1) hematochezia-stable overnight, no pain, stable hgb.  2) abnormal ekg, elevated troponins-further evaluation  per cardiology   Plan 1) flexible sigmoidoscopy this am, further recs to follow.   Electronic Signatures: Loistine Simas (MD)  (Signed 05-Dec-14 07:32)  Authored: Chief Complaint, VITAL SIGNS/ANCILLARY NOTES, Brief Assessment, Lab Results, Assessment/Plan   Last Updated: 05-Dec-14 07:32 by Loistine Simas (MD)

## 2014-08-06 NOTE — Consult Note (Signed)
Brief Consult Note: Diagnosis: rectal bleeding.   Patient was seen by consultant.   Consult note dictated.   Recommend to proceed with surgery or procedure.   Comments: Hx of LGIB.  Last flex sig 03/2013 for hematochezia with several distal avm that were not treated.    I will plan to do flex sig on Monday afternoon with fleets enema to see if there are still avm in distal colon.  If distal avm are present, would require full colon prep before treating due to risk of using cautery in setting of unpreppred colon.  His hgb is stable only small amount bleeding this am so does seem to be slowing.  Electronic Signatures: Arther Dames (MD)  (Signed 17-Jan-15 16:30)  Authored: Brief Consult Note   Last Updated: 17-Jan-15 16:30 by Arther Dames (MD)

## 2014-08-06 NOTE — Consult Note (Signed)
General Aspect 81 year old [place] American gentleman with history of end-stage renal disease on dialysis, hypertension, dementia, diabetes, on insulin,  seizure disorder, presenting with fever. Cardiology consulted for atrial fibrillation.   The patient presents from [facility] with documented fever and tachycardia. Patient is a poor historian.    In our Emergency Department, he was febrile 100.3 degrees, tachycardic with rate 124 as well as tachypnea and respiratory rate 24. He was started on broad-spectrum antibiotics thus far in treatment for sepsis of unclear etiology.   At 7 pm last night, after dialysis (1 liter taken off), he came back to the room, was walking to the commode and convered to atrial fibrillation at 7pm, rate 140s. He was given cardizem IV and converted back to NSR at  8:15 pm. No further arrhythmia.   SOCIAL HISTORY:  No documentation of alcohol, tobacco, or drug usage.   FAMILY HISTORY:  Positive for hypertension.   Review of Systems:  Subjective/Chief Complaint no complaints (poor historian)   General: No Complaints   Skin: No Complaints   ENT: No Complaints   Eyes: No Complaints   Neck: No Complaints   Respiratory: No Complaints   Cardiovascular: No Complaints   Gastrointestinal: No Complaints   Genitourinary: No Complaints   Vascular: No Complaints   Musculoskeletal: No Complaints   Neurologic: No Complaints   Hematologic: No Complaints   Endocrine: No Complaints   Psychiatric: No Complaints   Review of Systems: All other systems were reviewed and found to be negative   ROS patient is a poor historian and denies any problems   Medications/Allergies Reviewed Medications/Allergies reviewed     Transient Ischemic Attack:    Peripheral Vascular Disease:    ESRD:    Dementia:    HTN:    Diabetes:    GI Bleed:    Seizures:    blood transfusion:    prostate CA:    CVA:    dialysis:    AV  Fistula:    colon resection:        Admit Diagnosis:   SEPSIS.: Onset Date: 27-Nov-2013, Status: Active, Description: SEPSIS.  Home Medications: Medication Instructions Status  docusate sodium 100 mg oral capsule 1 cap(s) orally 2 times a day, As needed, constipation Active  acetaminophen-HYDROcodone 325 mg-5 mg oral tablet 1 tab(s) orally every 6 hours, As Needed, pain , As needed, pain Active  levETIRAcetam 500 mg oral tablet 1 tab(s) orally once a day Active  Levemir FlexTouch 100 units/mL subcutaneous solution 12 unit(s) subcutaneous 2 times a day Active  Metoprolol Tartrate 25 mg oral tablet 1 tab(s) orally 2 times a day for hypertension Active  NovoLOG FlexPen 100 units/mL subcutaneous solution inject per sliding scale subcutaneous 4 times a day (before meals and at bedtime). :if 0-150=0 units, 151-200=2 units, 201-250=4 units, 251-300=6 units, 301-350=8 units, 351-400=10 units, 401 -9999=10 units and call MD Active  atorvastatin 20 mg oral tablet 1 tab(s) orally once a day (at bedtime) Active  hydrALAZINE 50 mg oral tablet 1 tab(s) orally every 6 hours Active  nitroglycerin 0.2 mg/hr transdermal film, extended release 1 patch transdermal once a day and remove pre schedule Active  RisaQuad - oral capsule 1 cap(s) orally 2 times a day for probiotic Active  sucralfate 1 g oral tablet 1 tab(s) orally 4 times a day (before meals and at bedtime) Active  Vitamin C 500 mg oral tablet 1 tab(s) orally 2 times a day Active  amLODIPine 5 mg oral tablet  1 tab(s) orally once a day Active  omeprazole 20 mg oral delayed release tablet 1 tab(s) orally once a day Active  promethazine 25 mg oral tablet 1 tab(s) orally every 6 hours Active  Renvela carbonate 800 mg oral tablet 1 tab(s) orally 3 times a day Active   Lab Results:  Hepatic:  14-Aug-15 00:42   Bilirubin, Total 0.4  Alkaline Phosphatase  123 (46-116 NOTE: New Reference Range 11/02/13)  SGPT (ALT) 20 (14-63 NOTE: New Reference  Range 11/02/13)  SGOT (AST) 36  Total Protein, Serum 8.0  Albumin, Serum  2.8  Routine Micro:  14-Aug-15 00:42   Micro Text Report BLOOD CULTURE   COMMENT                   NO GROWTH IN 18-24 HOURS   ANTIBIOTIC                       Culture Comment NO GROWTH IN 18-24 HOURS  Result(s) reported on 27 Nov 2013 at 12:00AM.  Routine Chem:  14-Aug-15 00:42   Result Comment TROPONIN - RESULTS VERIFIED BY REPEAT TESTING.  - CALLED TO TRICIA SCHLUETER AT 0155 ON  - 11/26/13.Marland KitchenMarland KitchenTPL  - READ-BACK PROCESS PERFORMED.  Result(s) reported on 26 Nov 2013 at 01:30AM.  Glucose, Serum  160  BUN  70  Creatinine (comp)  9.75  Sodium, Serum 140  Potassium, Serum 4.8  Chloride, Serum  97  CO2, Serum 26  Calcium (Total), Serum 9.2  Osmolality (calc) 303  eGFR (African American)  5  eGFR (Non-African American)  4 (eGFR values <34m/min/1.73 m2 may be an indication of chronic kidney disease (CKD). Calculated eGFR is useful in patients with stable renal function. The eGFR calculation will not be reliable in acutely ill patients when serum creatinine is changing rapidly. It is not useful in  patients on dialysis. The eGFR calculation may not be applicable to patients at the low and high extremes of body sizes, pregnant women, and vegetarians.)  Anion Gap  17  Magnesium, Serum 1.9 (1.8-2.4 THERAPEUTIC RANGE: 4-7 mg/dL TOXIC: > 10 mg/dL  -----------------------)  Phosphorus, Serum  2.2 (Result(s) reported on 26 Nov 2013 at 02:00AM.)  Cardiac:  14-Aug-15 00:42   Troponin I  0.44 (0.00-0.05 0.05 ng/mL or less: NEGATIVE  Repeat testing in 3-6 hrs  if clinically indicated. >0.05 ng/mL: POTENTIAL  MYOCARDIAL INJURY. Repeat  testing in 3-6 hrs if  clinically indicated. NOTE: An increase or decrease  of 30% or more on serial  testing suggests a  clinically important change)  CPK-MB, Serum  5.7 (Result(s) reported on 26 Nov 2013 at 02:46AM.)    04:58   Troponin I  0.99 (0.00-0.05 0.05 ng/mL or  less: NEGATIVE  Repeat testing in 3-6 hrs  if clinically indicated. >0.05 ng/mL: POTENTIAL  MYOCARDIAL INJURY. Repeat  testing in 3-6 hrs if  clinically indicated. NOTE: An increase or decrease  of 30% or more on serial  testing suggests a  clinically important change)    15:55   Troponin I  1.20 (0.00-0.05 0.05 ng/mL or less: NEGATIVE  Repeat testing in 3-6 hrs  if clinically indicated. >0.05 ng/mL: POTENTIAL  MYOCARDIAL INJURY. Repeat  testing in 3-6 hrs if  clinically indicated. NOTE: An increase or decrease  of 30% or more on serial  testing suggests a  clinically important change)  15-Aug-15 06:28   Troponin I  0.88 (0.00-0.05 0.05 ng/mL or less: NEGATIVE  Repeat testing in 3-6  hrs  if clinically indicated. >0.05 ng/mL: POTENTIAL  MYOCARDIAL INJURY. Repeat  testing in 3-6 hrs if  clinically indicated. NOTE: An increase or decrease  of 30% or more on serial  testing suggests a  clinically important change)  Routine Coag:  14-Aug-15 00:42   Prothrombin 14.0  INR 1.1 (INR reference interval applies to patients on anticoagulant therapy. A single INR therapeutic range for coumarins is not optimal for all indications; however, the suggested range for most indications is 2.0 - 3.0. Exceptions to the INR Reference Range may include: Prosthetic heart valves, acute myocardial infarction, prevention of myocardial infarction, and combinations of aspirin and anticoagulant. The need for a higher or lower target INR must be assessed individually. Reference: The Pharmacology and Management of the Vitamin K  antagonists: the seventh ACCP Conference on Antithrombotic and Thrombolytic Therapy. QMGQQ.7619 Sept:126 (3suppl): [id number]. A HCT value >55% may artifactually increase the PT.  In one study,  the increase was an average of 25%. Reference:  "Effect on Routine and Special Coagulation Testing Values of Citrate Anticoagulant Adjustment in Patients with High HCT  Values." American Journal of Clinical Pathology 2006;126:400-405.)  Routine Hem:  14-Aug-15 00:42   WBC (CBC)  16.5  RBC (CBC)  4.05  Hemoglobin (CBC)  11.0  Hematocrit (CBC)  34.9  Platelet Count (CBC) 213  MCV 86  MCH 27.2  MCHC  31.6  RDW  19.4  Neutrophil % 90.1  Lymphocyte % 1.3  Monocyte % 8.3  Eosinophil % 0.0  Basophil % 0.3  Neutrophil #  14.9  Lymphocyte #  0.2  Monocyte #  1.4  Eosinophil # 0.0  Basophil # 0.0 (Result(s) reported on 26 Nov 2013 at 12:58AM.)   EKG:  Interpretation EKG shows NSR Tele shows atrial fib with rate 140 bpm at 7 pm to 8 pm   Radiology Results: XRay:    15-Aug-15 08:58, Chest PA and Lateral  Chest PA and Lateral   REASON FOR EXAM:    infiltrates.  COMMENTS:       PROCEDURE: DXR - DXR CHEST PA (OR AP) AND LATERAL  - Nov 27 2013  8:58AM     CLINICAL DATA:  Fever and tachycardia.    EXAM:  CHEST  2 VIEW    COMPARISON:  11/26/2013.    FINDINGS:  Small BILATERAL effusions. Increased cardiomediastinal silhouette.  No infiltrates or failure. Slight bibasilar subsegmental atelectatic  change. Low lung volumes.     IMPRESSION:  Stable to slightly worsening aeration. Small BILATERAL pleural  effusions. No definite airspace consolidation.      Electronically Signed    By: Rolla Flatten M.D.    On: 11/27/2013 09:43         Verified By: Staci Righter, M.D.,    PCN: Dizzy/Fainting  Aspirin: Bleeding  BC: Unknown  Vital Signs/Nurse's Notes: **Vital Signs.:   15-Aug-15 13:10  Vital Signs Type Routine  Temperature Temperature (F) 98.7  Celsius 37  Temperature Source axillary  Pulse Pulse 77  Respirations Respirations 18  Systolic BP Systolic BP 509  Diastolic BP (mmHg) Diastolic BP (mmHg) 59  Mean BP 75  Pulse Ox % Pulse Ox % 98  Pulse Ox Activity Level  At rest  Oxygen Delivery 2L    Impression 1. Sepsis:   leukocytosis, tachycardia, of unclear etiology.  Pancultured including blood. Antibiotic coverage with  vancomycin and Zosyn.  2) Atrial fibrillation Following HD yesterday for 1 1/4 hours last night Converted back to NSR with cardizem  IV x 1 Maintaining NSR now.  3) Elevated cardiac enz: demand ischemia in setting of tachycardia, hypotension, likely underlying CAD Given his poor mentation, no sx, underlying renal disease, would start with echocardiogram, consider stress test once infection/BP improved.  4. Hypertension. hypotensive in past 24 hours, possibly from sepsis, worse with dialysis medications on hold  5. Seizure disorder.  on Keppra  6. Diabetes.   insulin sliding scale with q. 6 hour Accu-Checks.   7. End-stage renal disease on dialysis.   nephrology follwoing   8. Venous thromboembolism prophylaxis   heparin subcutaneously.   Electronic Signatures: Ida Rogue (MD)  (Signed 15-Aug-15 15:24)  Authored: General Aspect/Present Illness, Review of System, Past Medical History, Health Issues, Home Medications, Labs, EKG , Radiology, Allergies, Vital Signs/Nurse's Notes, Impression/Plan   Last Updated: 15-Aug-15 15:24 by Ida Rogue (MD)

## 2014-08-06 NOTE — Discharge Summary (Signed)
PATIENT NAME:  Michael, Acosta MR#:  [id number] DATE OF BIRTH:  March 15, 1930  DATE OF ADMISSION:  04/25/2013 DATE OF DISCHARGE:  04/30/2013  Addendum  Please refer to discharge summary done by Dr. Tressia Miners yesterday. The patient's discharge was held pending disposition to rehab. The patient has a bed available; therefore, he will be able to be discharged to the skilled nursing facility. He will continue hemodialysis as previously. The patient will need further rehab and therapy for generalized deconditioning.   DISCHARGE MEDICATIONS: Calcium acetate 3 capsules t.i.d., omeprazole 20 daily, Keppra 500 daily, Keppra 500 on Monday, Wednesday and Friday after dialysis, simvastatin 40 daily, Plavix 75 p.o. daily, mupirocin topical ointment intranasally twice daily, metoprolol 25 p.o. b.i.d. insulin Levemir 12 units subQ b.i.d.   DIET: Low sodium, low fat with dysphagia 1 puree diet, nectar-thick liquids.   ACTIVITY: As tolerated.   PT evaluation and treatment.  Follow up with primary M.D. in 1 to 2 weeks. The patient is to have physical therapy and rehab.   TIME SPENT: 35 minutes.     ____________________________ Lafonda Mosses Posey Pronto, MD shp:dmm D: 04/30/2013 12:42:00 ET T: 04/30/2013 13:00:54 ET JOB#: [id number]  cc: Meeyah Ovitt H. Posey Pronto, MD, <Dictator> Alric Seton MD ELECTRONICALLY SIGNED 05/07/2013 8:13

## 2014-08-06 NOTE — H&P (Signed)
PATIENT NAME:  Michael Acosta, Michael Acosta MR#:  [id number] DATE OF BIRTH:  1929/04/26  DATE OF ADMISSION:  07/04/2013  PRIMARY CARE PHYSICIAN: Dr. Nicky Pugh.   CHIEF COMPLAINT: Blood in the stools.   REFERRING PHYSICIAN: Dr. Delman Kitten.   HISTORY OF PRESENT ILLNESS: Michael Acosta is an 81 year old male who recently had admission in February 2015 for rectal bleed, which was thought to be from radiation proctitis. The patient was seen by GI at that time, he had flexible sigmoidoscopy which confirmed  radiation proctitis. The patient has history of end-stage renal disease on hemodialysis. The patient states noted to have bright red blood, a large amount in the commode. Concerning this, called EMS and was brought to the Emergency Department. Work-up in the Emergency Department, the patient's hemoglobin is stable at 10.3. The patient had one bowel movement in the Emergency Department and did not show any blood in there. Denies having any abdominal pain.   PAST MEDICAL HISTORY: 1. End-stage renal disease on hemodialysis Monday, Wednesday, Friday schedule. 2. Diabetes mellitus.  3. Hypertension.  4. History of CVA x 3.  5. Recurrent seizures.  6. Prostate cancer.  7. Colon polyps status post hemicolectomy.  8. Dementia.   PAST SURGICAL HISTORY: 1. Partial colon resection for polyps.  2. AV fistula for dialysis access.   ALLERGIES:  1. ASPIRIN.  2.  . 3.  PENICILLIN.   HOME MEDICATIONS: 1. Sucralfate  1 gram 4 times a day. 2. Omeprazole  20 mg 2 times a day.  3. NovoLog FlexPen per sliding scale insulin.  4. Metoprolol 25 mg 2 times a day.  5. Keppra 500 mg once a day and also Monday, Wednesday, Friday.  6. Senna 100 mg 2 times a day.  7. Atorvastatin 20 mg daily.  8. Amlodipine 5 mg once a day.  9. Acetaminophen 650 mg every four hours as needed.   SOCIAL HISTORY: The patient has been living in a skilled nursing facility. No history of smoking, drinking alcohol or using illicit drugs.    FAMILY HISTORY: Significant for hypertension per records.   REVIEW OF SYSTEMS:  CONSTITUTIONAL: Generalized weakness.  EYES: No change in vision.  ENT: No change in hearing.  RESPIRATORY:  Has no cough, shortness of breath.  CARDIOVASCULAR: No chest pain, palpitations.  GASTROINTESTINAL: Has no nausea, vomiting, abdominal pain.  GENITOURINARY: No dysuria or hematuria.  ENDOCRINE: No polyuria or polydipsia HEMATOLOGIC: The patient had rectal bleed.  SKIN: No rashes or lesions.  MUSCULOSKELETAL: No joint pains and aches.  NEUROLOGICAL: The patient has history of CVA.   PHYSICAL EXAMINATION: GENERAL: This is a well-built, well-nourished, age-appropriate male lying down in the bed, not in distress.  VITAL SIGNS: Temperature 97.8, pulse 62, blood pressure 172/80, respiratory rate of 16, oxygen saturation is 97% on room air.   HEENT: Head normocephalic, atraumatic, no scleral icterus. Conjunctivae normal. Pupils equal and react to light. Extraocular movements are intact. Mucous membranes moist. No pharyngeal erythema.  NECK: Supple. No lymphadenopathy. No JVD. No productive bruit. No thyromegaly.  CHEST: Has no focal tenderness.  LUNGS: Bilateral clear to auscultation.  HEART: S1, S2 regular. No murmurs are heard.  ABDOMEN: Bowel sounds present. Soft, nontender, nondistended. No organomegaly.  EXTREMITIES: No pedal edema. Pulses 2+.  NEUROLOGIC: The patient is alert, oriented to place, person, time. The patient is somewhat slow to respond; however, answers the questions appropriately. Motor 5/5 in upper and lower extremities.   LABORATORY DATA: CBC: WBC of 6, hemoglobin 10.3, platelet count  of 181.   Complete metabolic panel: BUN 52, creatinine of 8.48 consistent with end-stage renal disease. The rest of the values are within normal limits.   ASSESSMENT AND PLAN: Michael Acosta is a 81 year old male who comes to the Emergency Department with bright red blood per rectum. 1. Rectal bleed.  The patient had a flexible sigmoidoscopy done in January 2015, which was consistent with a  radiation proctitis. Recommended sucralfate enemas. Uncertain how the patient has been receiving at the nursing home. The patient had one bowel movement in the Emergency Department, does not show any bleeding. Admit the patient under continued follow up with CBC. The patient's hemoglobin is stable at this time. No indication for the transfusion. If the patient develops any further episodes of bleeding, may consider consulting GI.  2. End-stage renal disease on hemodialysis Monday, Wednesday, Friday schedule. We will consult with nephrology.  3. Hypertension, moderately controlled. Continue with the home medications and follow up.  4. Diabetes mellitus. Continue with home dose of the Levemir.  5. Keep the patient on deep vein thrombosis prophylaxis with sequential compression devices.   TIME SPENT: 50 minutes.    ____________________________ Monica Becton, MD pv:sg D: 07/04/2013 23:57:59 ET T: 07/05/2013 07:20:54 ET JOB#: [id number]  cc: Monica Becton, MD, <Dictator> Mikeal Hawthorne. Brynda Greathouse, MD  Grier Mitts Riley Papin MD ELECTRONICALLY SIGNED 07/29/2013 0:21

## 2014-08-06 NOTE — Consult Note (Signed)
Brief Consult Note: Diagnosis: LGIB.   Patient was seen by consultant.   Consult note dictated.   Recommend further assessment or treatment.   Orders entered.   Comments: 1) LGIB - likely radiation procitits.  He was not getting carafate enemas as he was supposed to.  Hgb actually up from prev hosp.   Recs: -carafate 10 ml PR BID - if continued bleeding or drop in Hgb despite trial of carafate ,then will need full colon prep.  Electronic Signatures: Arther Dames (MD)  (Signed 24-Feb-15 17:42)  Authored: Brief Consult Note   Last Updated: 24-Feb-15 17:42 by Arther Dames (MD)

## 2014-08-06 NOTE — Consult Note (Signed)
PATIENT NAME:  Michael Acosta, Michael Acosta MR#:  [id number] DATE OF BIRTH:  August 04, 1929  DATE OF CONSULTATION:  05/01/2013  REFERRING PHYSICIAN:  Conni Slipper, M.D. CONSULTING PHYSICIAN:  Arther Dames, MD  REASON FOR THE CONSULTATION:  Rectal bleeding.   HISTORY OF PRESENT ILLNESS:  Michael Acosta is an 81 year old male with a past medical history notable for end-stage renal disease, diabetes, hypertension, CVA, who is admitted to the hospital for rectal bleeding.  Michael Acosta was just discharged from the hospital after a complicated course including new onset seizures, respiratory failure requiring intubation.  He was discharged to a skilled nursing facility and there he was noted to have bright red blood per rectum.  I did take a history from Michael Acosta, although I am not sure how reliable it is.  This is also in combination with the HPI.   Michael Acosta reports that he had several episodes of bright red blood.  He does think it was a small amount.  He also thinks his last episode was either this morning or yesterday evening.   In the hospital he has remained hemodynamically stable and his hemoglobin has been stable.  Of note, he did have a recent flexible sigmoidoscopy done by Dr. Gustavo Lah in December in the setting of hematochezia and Dr. Gustavo Lah did note diverticulosis along with multiple angiectasias in the distal colon.  These were not treated.   PAST MEDICAL HISTORY: 1.  End-stage renal disease.  2.  CVA.  3.  Hypertension.  4.  DM 2.  5.  Prostate cancer.   PAST SURGICAL HISTORY:  1.  Partial colon resection for polyps.  2.  AV fistula for dialysis access.   ALLERGIES:  ASPIRIN, BC POWDER, PENICILLIN.   FAMILY HISTORY:  Hypertension.   SOCIAL HISTORY:  He was recently residing at a skilled nursing facility.  He denies tobacco or alcohol.   HOME MEDICATIONS:   1.  Keppra 500 mg daily along with Keppra 100 mg on Monday, Wednesday, Friday after dialysis.  2.  Insulin Detemir 12 units twice a  day.  3.  Simvastatin 40 mg at bedtime.  4.  Plavix 75 daily.  5.  Metoprolol 25 mg twice daily.  6.  Calcium acetate 667 3 times a day.  7.  Omeprazole 20 mg daily.   REVIEW OF SYSTEMS:   CONSTITUTIONAL: No weight gain or weight loss.  No fever or chills. HEENT: No oral lesions or sore throat. No vision changes. GASTROINTESTINAL: See HPI.  HEME/LYMPH: No easy bruising or bleeding. CARDIOVASCULAR: No chest pain or dyspnea on exertion. GENITOURINARY: No hematuria. INTEGUMENTARY: No rashes or pruritus PSYCHIATRIC: No depression/anxiety.  ENDOCRINE: No heat/cold intolerance, no hair loss or skin changes. ALLERGIC/IMMUNOLOGIC: Negative for hives. RESPIRATORY: No cough, no shortness of breath.  MUSCULOSKELETAL: No joint swelling or muscle pain.   PHYSICAL EXAMINATION: VITAL SIGNS:  Temperature is 98.2, pulse is 67, blood pressure 158/77, respirations are 22, pulse ox is 96% on room air.  GENERAL: Elderly-appearing male, only alert and oriented x 2, slightly somnolent. HEENT: Normocephalic/atraumatic. Extraocular movements are intact. Anicteric.  NECK: Soft, supple. JVP appears normal. No adenopathy.  CHEST: Clear to auscultation. No wheeze or crackle. Respirations unlabored.  HEART: Regular. No murmur, rub, or gallop.  Normal S1 and S2.  ABDOMEN: Mild diffuse tenderness to palpation.  Normal active bowel sounds in all four quadrants.  No organomegaly. No masses.  EXTREMITIES: No swelling, well perfused.  SKIN: No rash or lesion. Skin color, texture, turgor normal.  NEUROLOGICAL:  Grossly intact.  PSYCHIATRIC: Normal tone and affect.  MUSCULOSKELETAL: No joint swelling or erythema.   LABORATORY DATA:  Sodium 135, potassium 3.9, creatinine 6, BUN 31.  Liver enzymes are normal except for an albumin of 2.7, hemoglobin currently is 9, platelets are 175.  INR is 0.9.   ASSESSMENT AND PLAN:  Lower gastrointestinal bleeding:  It does seem as though this is hemodynamically stable and  with a normal hemoglobin, would suggest that this is a relatively minor lower gastrointestinal bleed.  However, this is a recurrent problem for Michael Acosta at this time.   PLAN:  Therefore, I would plan to perform a flexible sigmoidoscopy with Fleet's enema prep on Monday.  This will be determined if he still is actively bleeding and if the arteriovenous malformations are a potential source of the bleeding.  Likely I will not be able to treat the arteriovenous malformations without a full colonoscopy prep due to the risk of using cautery in the setting of an unprepped colon.  Depending on what I find it may necessitate a full colonoscopy prep for treatment.  However, given his overall poor medical condition, we will need to weigh the risks and benefits of this.   In the meantime, we can continue to monitor his hemoglobin and his hemodynamics.  I do not anticipate any need for packed red blood cells.   Further recommendations pending the findings on flexible sigmoidoscopy.  Thank you for this consult.    ____________________________ Arther Dames, MD mr:ea D: 05/01/2013 23:25:44 ET T: 05/02/2013 00:15:50 ET JOB#: [id number]  cc: Arther Dames, MD, <Dictator> Mellody Life MD ELECTRONICALLY SIGNED 05/04/2013 19:52

## 2014-08-06 NOTE — Op Note (Signed)
PATIENT NAME:  Michael Acosta, Michael Acosta MR#:  [id number] DATE OF BIRTH:  05-04-1929  DATE OF PROCEDURE:  05/01/2013  PREOPERATIVE DIAGNOSIS: Renal failure, lower gastrointestinal bleeding, lack of IV access.   POSTOPERATIVE DIAGNOSIS:  Renal failure, lower gastrointestinal bleeding, lack of IV access.   OPERATION: Left internal jugular catheter insertion under ultrasound guidance.   SURGEON: Rodena Goldmann, MD  ANESTHESIA: Local.   OPERATIVE PROCEDURE: With the patient in the supine position, his right neck was prepped with ChloraPrep and draped with sterile towels. The neck had been interrogated and there appeared to be a fairly large compressible right internal jugular vein. However, more proximally, it did not appear to be as patent but it is very difficult to tell from the patient's neck position and he could not relax any further. The area was anesthetized and the vein cannulated without difficulty. A wire, however, could not be passed into the great vessel system. The wire would extend approximately 6 to 8 cm past the end of the needle but would not pass into the great vessel system. Multiple attempts were made without success. There did not appear to be any injury. The patient was repositioned, reprepped and a knew setup brought to the table. The left neck interrogated and there appeared to be a large compressible jugular vein on the left side. After local anesthesia the vein was cannulated on a single pass and a wire passed into the great vessel system without difficulty. Ultrasound guidance was used for cannulation. The needle was removed. The skin incised, dilator placed over the wire. The dilator removed and a triple-lumen catheter inserted over the wire into the great vessel system. The wire was removed. It appeared to be intact without any angulations. The catheter was sutured in place with a 3-0 silk. The catheters was flushed and capped. Sterile dressing was applied. The patient was returned to the  care of the Emergency Room staff.  He tolerated the procedure reasonably well.     ____________________________ Micheline Maze, MD rle:dp D: 05/01/2013 06:41:31 ET T: 05/01/2013 07:35:06 ET JOB#: [id number]  cc: Rodena Goldmann III, MD, <Dictator> Rodena Goldmann MD ELECTRONICALLY SIGNED 05/03/2013 0:58

## 2014-08-06 NOTE — Consult Note (Signed)
Chief Complaint:  Subjective/Chief Complaint seen for recurrent hematochezia.  no recurrenct since admission.  denies n or abdominal pain. prep not adequate for proceedure today.   VITAL SIGNS/ANCILLARY NOTES: **Vital Signs.:   25-Mar-15 07:54  Celsius 36.9  Temperature Source oral  Pulse Pulse 81  Respirations Respirations 18  Systolic BP Systolic BP 016  Diastolic BP (mmHg) Diastolic BP (mmHg) 75  Mean BP 117  Pulse Ox % Pulse Ox % 94  Pulse Ox Activity Level  At rest  Oxygen Delivery Room Air/ 21 %    09:05  Pulse Pulse 91  Systolic BP Systolic BP 553  Diastolic BP (mmHg) Diastolic BP (mmHg) 88  Mean BP 120  *Intake and Output.:   25-Mar-15 00:05  Stool  Large liquid blood tinged stool    00:55  Stool  medium liquid blood tinged stool    03:27  Stool  small liquid brown stool.    04:09  Stool  small loose brown stool.    05:01  Stool  small loose brown stool    09:09  Stool  small balls with a little bit of liquid    10:15  Stool  small liquid   Brief Assessment:  Cardiac Regular   Respiratory clear BS   Gastrointestinal details normal Soft  Nontender  Nondistended  No masses palpable  Bowel sounds normal   Lab Results: General Ref:  23-Mar-15 14:41   HBsAg ========== TEST NAME ==========  ========= RESULTS =========  = REFERENCE RANGE =  HEPATITIS B SURFACE AG  HBsAg Screen HBsAg Screen                    [   Negative             ]          Negative               [facility]            No: [id number]           [address], [place], [place] [address]           Lindon Romp, MD         [phone]   Result(s) reported on 06 Jul 2013 at 01:16PM.  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  2) multiple medical problems with ESRD, DM, htn, h/o cva with seizure d/o, prostate ca, probable right hemicolectomy for colon polyp in the past, early dementia.   Plan 1) will re- prep for proceedure tomorrow pm.  I have discussed the risks benefits and complications of colonoscopy to include not limited to bleeding infection perforation and sedation and he wishes to proceed.  I have made several phone calls to discuss with family without success, but nursing has talked with family for consent.   Electronic Signatures: Loistine Simas (MD)  (Signed 25-Mar-15 12:58)  Authored: Chief Complaint, VITAL SIGNS/ANCILLARY NOTES, Brief Assessment, Lab Results,  Assessment/Plan   Last Updated: 25-Mar-15 12:58 by Loistine Simas (MD)

## 2014-08-06 NOTE — H&P (Signed)
PATIENT NAME:  Michael Acosta, LEWELLEN MR#:  [id number] DATE OF BIRTH:  10-01-1929  DATE OF ADMISSION:  04/25/2013  PRIMARY CARE PHYSICIAN: Dr. Luan Pulling.  REFERRING EMERGENCY ROOM  PHYSICIAN: Dr. Lenise Arena.   CHIEF COMPLAINT: Seizures.  HISTORY OF PRESENT ILLNESS: The patient is an 81 year old African-American male with a past medical history of end-stage renal disease on hemodialysis on Monday, Wednesday, Friday, last dialysis was done on Friday, diabetes mellitus, hypertension, stroke x 3. Prostate cancer status post radiation, currently cancer free. He was brought into the ER via EMS after he started seizing. According to the wife the patient had dialysis on Friday and he was feeling weak the whole day yesterday. He fell asleep in the recliner last night. His wife went to check on him after midnight and found him being stiff. At that time the patient was not responding to her verbal commands and his mouth was quivering. She called EMS right away. By the time EMS went , the patient was twitching and he was shaking his whole body. The patient was not responding to commands, both eyes were fixed to right side. He has received Versed via IV by the EMS x 2 doses. The patient was brought into the ER and in the ER, the patient still continues to seize and he has received two versed and subsequently four doses of Ativan. The patient was intubated prophylactically for airway protection. He has received 1 gram of Keppra. He is started on propofol drip. The patient never had any episodes of seizures in the past. He was just recently admitted to the hospital in December 2014 for rectal bleeding. He was discharged on 03/20/2013 with a diagnosis of lower gastrointestinal bleed due to diverticulosis. During that admission, they had noticed questionab  mass and follow-up MRI as an outpatient was recommended and the patient was referred to urology. As reported by the EMS and ER medical staff, the patient seized at least  for 30 minutes continuously. CAT scan of the head did not reveal any acute findings. Chest x-ray was also negative. As the patient is intubated I was unable to get any history from the patient. I have received the history from the patient's wife at bedside and from the ER staff.   PAST MEDICAL HISTORY: History of prostate cancer status post radiation therapy, cancer free. End-stage renal disease on hemodialysis on Monday and Friday last dialysis was done on Friday. Diabetes mellitus, hypertension, stroke x 3, has had generalized weakness from that.   PAST SURGICAL HISTORY: Partial colon resection for polyps, left AV fistula.   ALLERGIES: ASPIRIN AND PENICILLIN.   PSYCHOSOCIAL HISTORY: Lives at home with spouse. No history of smoking, alcohol or illicit drug usage as reported by the wife.   FAMILY HISTORY: Father had cancer of unknown cause.   HOME MEDICATIONS: Pravastatin 20 mg p.o. at bedtime, , metoprolol p.o. 2 times a day, levofloxacin 500 mg once daily, iron supplement, Calcium acetate 667, 3 capsules p.o. 3 times a day.   REVIEW OF SYSTEMS: Unobtainable as the patient is intubated and sedated.   PHYSICAL EXAMINATION: VITAL SIGNS: Temperature 98.1, pulse 61, respirations 18, blood pressure 173/83, pulse oximetry is 100% on ventilator.  GENERAL APPEARANCE: Not in acute distress. Moderately built and nourished.  HEENT: Normocephalic, atraumatic. Pupils are sluggishly reacting to light and accommodation. No scleral icterus, conjunctival injection.. Nares are patent, but congested with mucousy discharge. No sinus tenderness. Moist mucous membranes.  NECK: Supple. No JVD. No thyromegaly.  LUNGS: Coarse breath  sounds were noticed. No accessory muscle use and no anterior chest wall tenderness on palpation.  CARDIAC: S1, S2 normal. Regular rate and rhythm. No murmurs.  GASTROINTESTINAL: Soft. Bowel sounds are positive in all four quadrants. Nontender, nondistended. No hepatosplenomegaly. No  masses. NEUROLOGICAL:  The patient is sedated on propofol drip as he is intubated.  PSYCHIATRIC:  Mood and affect could not be elicited as the patient is intubated and sedated.  MUSCULOSKELETAL: No joint effusion, tenderness, erythema.  SKIN:  Left upper extremity has AV fistula. No other lesions,  bruises were noticed.   LABORATORY AND IMAGING STUDIES: Initial blood sugar was 564 Accu-Chek, repeated 150, creatinine 7.41, sodium 134, potassium 4.3, chloride 97, CO2 27, GFR 7, anion gap is 10, serum osmolality 306, calcium 8.5. Serum alcohol level is less than 3. CK total is 88, CPK MB 3.9, troponin 0.072. LFTs are normal except alkaline phosphatase which is elevated at 174. WBC 5.6, hemoglobin 11.3, hematocrit 34.5, platelets 148, PT 12.1, INR 0.9, pH is 7.40, pCO2 45, pO2 118 and FiO2 35%, base excess 2.5, bicarbonate is 27.9. PT 12.1, INR 0.9.   Chest x-ray, no acute findings, cardiomegaly and mild interstitial edema is present. CT head without contrast has revealed no acute abnormality, Atrophy, chronic microvascular ischemic changes, remote infarct as described.   ASSESSMENT AND PLAN: An 81 year old African American male with a history of prostate cancer status post radiation therapy currently under surveillance, is coming to the Emergency Room with chief complaint of intractable seizures which are new onset.   ASSESSMENT AND PLAN: 1. Status epilepticus. We will admit him to Critical Care Unit status post prophylactic intubation. The patient will be on Keppra. We will provide him Ativan as needed. EEG is ordered. Neurology consult is placed to on-call neurologist.  2. End-stage renal disease on hemodialysis. Nephrology consult is placed to continue dialysis on Monday, Wednesday, Friday.  3. Diabetes mellitus.  The patient is hyperglycemic. The patient is going to be n.p.o. We will put him on sliding scale insulin and monitor Accu-Cheks closely. If necessary we will put him on insulin protocol per  Critical Care Unit routine.  4. Hypertension. Blood pressure is elevated.  5. History of prostate cancer status post radiation therapy. Currently, the patient is under surveillance as reported by the patient's wife.  6. We will provide him gastrointestinal prophylaxis and deep vein thrombosis prophylaxis with sequential compression devices. 7. The patient is FULL CODE. Wife is the medical power of attorney.   Diagnosis and plan of care was discussed in detail with the patient's wife at bedside. She verbalized understanding of the plan. Total critical-care time spent on admission is 50 minutes.    ____________________________ Nicholes Mango, MD ag:sg D: 04/25/2013 06:04:35 ET T: 04/25/2013 06:44:40 ET JOB#: [id number]  cc: Nicholes Mango, MD, <Dictator> Dr. Read Drivers, MD   Nicholes Mango MD ELECTRONICALLY SIGNED 05/06/2013 0:54

## 2014-08-06 NOTE — Discharge Summary (Signed)
PATIENT NAME:  ABDIAZIZ, Michael Acosta MR#:  [id number] DATE OF BIRTH:  [birth year]  DATE OF ADMISSION:  07/04/2013 DATE OF DISCHARGE:  07/11/2013  ADDENDUM: To the discharge summary dictated by Dr. Benjie Karvonen on July 09, 2013 and Dr. Tressia Miners on July 10, 2013.   Michael Acosta was supposed to be discharged on July 10, 2013 after debridement of his left heel ulcer by Dr. Elvina Mattes, however, the transportation and the [facility] did not carry the same wound VAC that was applied here at our hospital so we had to wait for a whole 24 hours to take the wound VAC off, put on a wet-to-dry dressing during transportation and then the patient will have a wound VAC resumed at the long-term acute care facility. His course has not changed anything. The patient has remained stable in the last 24 hours. He is on a full liquid diet for his GI bleed history and radiation proctitis and can be started back on a low residue now.   For end-stage renal disease, on hemodialysis, the patient is due for dialysis on July 02, 2013. His cultures will be followed up on here, but he is being discharged on antibiotics.  ADDITIONAL TIME SPENT: 35 minutes.   ____________________________ Gladstone Lighter, MD rk:sb D: 07/12/2013 14:09:00 ET T: 07/12/2013 16:58:55 ET JOB#: [id number]  cc: Gladstone Lighter, MD, <Dictator> Gladstone Lighter MD ELECTRONICALLY SIGNED 07/22/2013 13:54

## 2014-08-06 NOTE — Consult Note (Signed)
Chief Complaint:  Subjective/Chief Complaint seen for hematochezia. small watery bm last night, small amount of bleed, none today.  denies n or abdominalpain.  on clears.   VITAL SIGNS/ANCILLARY NOTES: **Vital Signs.:   27-Mar-15 16:51  Vital Signs Type Post-Procedure  Celsius 36.3  Temperature Source oral  Pulse Pulse 70  Respirations Respirations 18  Systolic BP Systolic BP 725  Diastolic BP (mmHg) Diastolic BP (mmHg) 79  Mean BP 113  Pulse Ox % Pulse Ox % 98  Pulse Ox Activity Level  At rest  Oxygen Delivery Room Air/ 21 %   Brief Assessment:  Cardiac Regular   Respiratory clear BS   Gastrointestinal details normal Soft  Nontender  Nondistended  No masses palpable  Bowel sounds normal   Lab Results: Routine Chem:  26-Mar-15 06:05   Result Comment HGB - SPECIMEN CLOTTED-NOTIFIED CESIELY  - FERGUSON @0620  07-08-13 FOR RECOLLECT  - AJO  Result(s) reported on 08 Jul 2013 at [facility].    06:29   Result Comment HGB - SPECIMEN CLOTTED  Result(s) reported on 08 Jul 2013 at 06:45AM.  27-Mar-15 05:19   Glucose, Serum  60  BUN  31  Creatinine (comp)  8.32  Sodium, Serum 137  Potassium, Serum 4.8  Chloride, Serum 98  CO2, Serum 30  Calcium (Total), Serum 9.0  Anion Gap 9  Osmolality (calc) 278  eGFR (African American)  6  eGFR (Non-African American)  5 (eGFR values <64m/min/1.73 m2 may be an indication of chronic kidney disease (CKD). Calculated eGFR is useful in patients with stable renal function. The eGFR calculation will not be reliable in acutely ill patients when serum creatinine is changing rapidly. It is not useful in  patients on dialysis. The eGFR calculation may not be applicable to patients at the low and high extremes of body sizes, pregnant women, and vegetarians.)  Routine Coag:  26-Mar-15 12:52   Prothrombin 13.4  INR 1.0 (INR reference interval applies to patients on anticoagulant therapy. A single INR therapeutic range for coumarins is not optimal  for all indications; however, the suggested range for most indications is 2.0 - 3.0. Exceptions to the INR Reference Range may include: Prosthetic heart valves, acute myocardial infarction, prevention of myocardial infarction, and combinations of aspirin and anticoagulant. The need for a higher or lower target INR must be assessed individually. Reference: The Pharmacology and Management of the Vitamin K  antagonists: the seventh ACCP Conference on Antithrombotic and Thrombolytic Therapy. CDGUYQ.0347Sept:126 (3suppl): [id number] A HCT value >55% may artifactually increase the PT.  In one study,  the increase was an average of 25%. Reference:  "Effect on Routine and Special Coagulation Testing Values of Citrate Anticoagulant Adjustment in Patients with High HCT Values." American Journal of Clinical Pathology 2006;126:400-405.)  Routine Hem:  26-Mar-15 06:05   Hemoglobin (CBC) -    06:29   Hemoglobin (CBC) -    07:07   Hemoglobin (CBC)  10.0 (Result(s) reported on 08 Jul 2013 at 07:34AM.)  27-Mar-15 05:19   WBC (CBC) 5.6  RBC (CBC)  3.64  Hemoglobin (CBC)  10.7  Hematocrit (CBC)  33.2  Platelet Count (CBC) 189  MCV 91  MCH 29.3  MCHC 32.1  RDW  16.5  Neutrophil % 73.7  Lymphocyte % 11.1  Monocyte % 11.7  Eosinophil % 2.8  Basophil % 0.7  Neutrophil # 4.2  Lymphocyte #  0.6  Monocyte # 0.7  Eosinophil # 0.2  Basophil # 0.0 (Result(s) reported on 09 Jul 2013 at 05:43AM.)  Assessment/Plan:  Assessment/Plan:  Assessment 1) rectal bleeding/lower GI bleed.  Most likely from abnormal rectal telangiectasias s/p radiation proctitis.  s/p treatment of these rectal vascular lesions.  stable no repeat bleeding today.   2) multiple medical issues-ESRD, dm, htn, h.o prostate ca, heel ulcer.   Plan 1) tomorrow start full liquids for 2 days before advancing to low residue for a week.  following.   Electronic Signatures: Loistine Simas (MD)  (Signed 27-Mar-15 17:04)  Authored:  Chief Complaint, VITAL SIGNS/ANCILLARY NOTES, Brief Assessment, Lab Results, Assessment/Plan   Last Updated: 27-Mar-15 17:04 by Loistine Simas (MD)

## 2014-08-06 NOTE — Consult Note (Signed)
Details:   - GI follow up:  S: no compliants.  Wants diet advanced.  No nv/.  NO futher rectal bleeding.   O:vss a and o x 3, somewhat confused cta, no w/c rrr, no m/r/g soft, nabs, nd  Labs: Hgb stable today.   a/p:  If no further bleeding and Hgb stable in am, ok with dc/ tomorrow - will need full prep with colonoscopy for treatment of radiation proctitis with APC if futher bleeding or drop in hgb.   A/P:   Lab Results:    Routine Chem:  21-Jan-15 07:25   Result Comment LABS - This specimen was collected through an   - indwelling catheter or arterial line.  - A minimum of 1ms of blood was wasted prior    - to collecting the sample.  Interpret  - results with caution.  Result(s) reported on 05 May 2013 at 07:52AM.  Glucose, Serum  105  BUN  32  Creatinine (comp)  8.44  Sodium, Serum  135  Potassium, Serum 4.2  Chloride, Serum  95  CO2, Serum 32  Calcium (Total), Serum  7.9  Anion Gap 8  Osmolality (calc) 277  eGFR (African American)  6  eGFR (Non-African American)  5 (eGFR values <661mmin/1.73 m2 may be an indication of chronic kidney disease (CKD). Calculated eGFR is useful in patients with stable renal function. The eGFR calculation will not be reliable in acutely ill patients when serum creatinine is changing rapidly. It is not useful in  patients on dialysis. The eGFR calculation may not be applicable to patients at the low and high extremes of body sizes, pregnant women, and vegetarians.)    09:59   Phosphorus, Serum 4.0 (Result(s) reported on 05 May 2013 at 10:32AM.)  Routine Hem:  21-Jan-15 07:25   WBC (CBC) 5.3  RBC (CBC)  2.90  Hemoglobin (CBC)  8.5  Hematocrit (CBC)  27.1  Platelet Count (CBC) 235  MCV 93  MCH 29.3  MCHC  31.5  RDW  15.6  Neutrophil % 76.2  Lymphocyte % 9.9  Monocyte % 11.7  Eosinophil % 1.3  Basophil % 0.9  Neutrophil # 4.1  Lymphocyte #  0.5  Monocyte # 0.6  Eosinophil # 0.1  Basophil # 0.0   Electronic  Signatures: ReArther DamesMD)  (Signed 21-Jan-15 17:10)  Authored: Details, Lab Results   Last Updated: 21-Jan-15 17:10 by ReArther DamesMD)

## 2014-08-06 NOTE — Consult Note (Signed)
PATIENT NAME:  Michael Acosta, Michael Acosta MR#:  [id number] DATE OF BIRTH:  February 28, 1930  DATE OF CONSULTATION:  06/08/2013  REFERRING PHYSICIAN:  Dr. Anselm Jungling.  CONSULTING PHYSICIAN:  Arther Dames, MD  REASON FOR THE CONSULT: Rectal bleeding.   HISTORY OF PRESENT ILLNESS: Michael Acosta is an 81 year old male with a past medical history notable for end-stage renal disease, CVA, hypertension who is admitted to the hospital for evaluation of rectal bleeding. I did meet Michael Acosta previously during a hospitalization in January for rectal bleeding. At that time, he did undergo a flexible sigmoidoscopy which was notable for what appeared to be radiation proctitis. This was unable to be treated due to the lack of a full a prep and the risk of explosion risk with APC.    The plan was to start him on sucralfate enemas, and if this was not successful, to go ahead with a full colonoscopy prep and APC.    It sounds, per Michael Acosta and his daughter, that unfortunately was never started on the sucralfate at his rehab facility. He has continued to have some mild episodes of bright red blood.   PAST MEDICAL HISTORY:  1. End-stage renal disease.   2. CVA.  3. Hypertension.  4. DM2.  5. Prostate cancer.   PAST SURGICAL HISTORY: He has had a partial colon resection.   FAMILY HISTORY: He denies any family history of GI malignancy to me.   SOCIAL HISTORY: He is living in a skilled nursing facility. He does not smoke or drink alcohol.   REVIEW OF SYSTEMS: A 10-system review was conducted. It is negative except as stated in the HPI.   HOME MEDICATIONS:  1. Keppra 500 mg daily.  2. Insulin detemir 12 units b.i.d.  3. Simvastatin 40 mg at bedtime.  4. Plavix 75 mg daily.  5. Metoprolol 25 mg twice a day.  6. Calcium acetate 667 t.i.d.  7. Omeprazole 20 mg daily.   PHYSICAL EXAMINATION:  VITAL SIGNS: His temperature is 98.2. His pulse is 73. Respirations are 18. Blood pressure 145/69. His pulse ox is 99% on 3  liters of oxygen.  GENERAL: No acute distress. Appears stated age. Chronically ill-appearing male. He is alert and oriented x 3 but somewhat confused at times.  HEENT: Normocephalic/atraumatic. Extraocular movements are intact. Anicteric. Wearing nasal cannula.  NECK: Soft, supple. JVP appears normal. No adenopathy. CHEST: Clear to auscultation. No wheeze or crackle. Respirations unlabored. HEART: Regular. No murmur, rub, or gallop.  Normal S1 and S2. ABDOMEN: Soft, nontender, nondistended.  Normal active bowel sounds in all four quadrants.  No organomegaly. No masses EXTREMITIES: No swelling, well perfused. SKIN: No rash or lesion. Skin color, texture, turgor normal. NEUROLOGICAL: Grossly intact. PSYCHIATRIC: Normal tone and affect. MUSCULOSKELETAL: No joint swelling or erythema.   DATA: Sodium 137, potassium is 3.4, BUN 22, creatinine is 5.61. His lipase is normal. Liver enzymes are normal except his albumin is 2.9. His white count is 7. Hemoglobin is 8.8 which is down from 9.8. His hematocrit is 30. His platelets are 194. His INR is 1.1.   ASSESSMENT AND PLAN:  1. Lower gastrointestinal bleed: The likely source of this is his radiation proctitis which was seen on the previous flexible sigmoidoscopy. Unfortunately, he was not started on the sucralfate enemas as planned. Therefore, his radiation proctitis has essentially gone untreated.   PLAN: He is quite debilitated and would be high risk for colonoscopy and colonoscopy prep. The plan was to do a colonoscopy if he did  not respond to the sucralfate. Since he has not yet even had a trial of sucralfate, I would favor trying the sucralfate enema to see if this can avoid a colonoscopy. We will go ahead and start him on sucralfate 1 g twice a day enemas. We will also continue to monitor his hemoglobin closely and see if he continues to bleed.   If he continues to bleed or drop his hemoglobin while in the hospital, it will be necessary to perform a  full colonoscopy prep with APC of the rectal radiation proctitis. For now, he can continue on clear fluids.   Thank you for this consult.   ____________________________ Arther Dames, MD mr:gb D: 06/08/2013 22:04:04 ET T: 06/08/2013 23:17:50 ET JOB#: [id number]  cc: Arther Dames, MD, <Dictator> Mellody Life MD ELECTRONICALLY SIGNED 06/15/2013 13:50

## 2014-08-06 NOTE — Consult Note (Signed)
Referring Physician:  Nicholes Mango :   Primary Care Physician:  Nonlocal MD, MD :   Reason for Consult: Admit Date: 25-Apr-2013  Chief Complaint: seizues  Reason for Consult: seizure   History of Present Illness: History of Present Illness:   Michael Acosta is an 81 yo man with PMH notable for prior stroke and ESRD on IHD who presented to [facility] ED this morning for management of witnessed convulsive activity. - data from patient and chart review was in his usual state of health and woke in the middle of the night to use the restroom. When he left the restroom, he went to the living room and lay down in the recliner, which is not unusual for him. About 10 minutes later, his wife went to check on him and found that his mouth was trembling. His eyes were closed and he did not have any head turning or movements of his limbs. She was unable to arouse him by voice or by touching his face, and so called EMS. The trembling episode stopped, and recurred again en route to the hospital. Upon arrival to [facility], he was found to be poorly responsive and was intubated for airway protection. He was loaded with levetiracetam and started on a high-dose propofol infusion (160/hr) for treatment of presumed status epilepticus. Since admission, he has not had any further episodes concerning for seizure activity. his baseline, he has speech difficulties and vision difficulties from 3 prior strokes that his wife knows of. His first stroke was in 2002, and to his wife's knowledge his physicians have not discovered what has caused his strokes. He has not had a history of seizures in the past, febrile seizures, or CNS trauma or infection. His wife does not believe he has any family history of epilepsy. was extubated today and he is having some cough, no seizure activity noted per nursing staff.  ROS:  General denies complaints   HEENT no complaints   Lungs no complaints   Cardiac no complaints   GI no complaints   GU no  complaints   Musculoskeletal no complaints   Extremities no complaints   Skin no complaints   Endocrine no complaints   Psych no complaints   Review of Systems   ROS not reliable  Past Medical/Surgical Hx:  blood transfusion:   prostate CA:   CVA:   dialysis:   colon resection:   Past Medical/ Surgical Hx:  Past Medical History History of prostate cancer status post radiation therapy, cancer free. End-stage renal disease on hemodialysis on Monday and Friday last dialysis was done on Friday. Diabetes mellitus, hypertension, stroke x 3, has had generalized weakness from that.   Past Surgical History Partial colon resection for polyps, left AV   Home Medications: Medication Instructions Last Modified Date/Time  omeprazole 20 mg oral delayed release capsule 1 cap(s) orally once a day 06-Dec-14 16:01  levofloxacin 500 milligram(s) orally every 48 hours 06-Dec-14 16:01  calcium acetate 667 mg oral capsule 3 cap(s) orally 3 times a day 06-Dec-14 16:01  metoprolol 25 mg oral tablet, extended release .5 tab(s) orally twice a day   (am and noon) 06-Dec-14 16:01  pravastatin 20 mg oral tablet 1 tab(s) orally once a day (at bedtime) 06-Dec-14 16:01  Iron 1043m 1   once a day evening 06-Dec-14 16:01   Allergies:  PCN: Dizzy/Fainting  Aspirin: Bleeding  Social/Family History: Social History: Lives at home with spouse. No history of smoking, alcohol or illicit drug usage as reported by  the wife.  Family History: Father had cancer of unknown cause.   Vital Signs: **Vital Signs.:   12-Jan-15 10:00  Vital Signs Type Routine  Pulse Pulse 90  Respirations Respirations 17  Systolic BP Systolic BP 022  Diastolic BP (mmHg) Diastolic BP (mmHg) 67  Mean BP 96  Pulse Ox % Pulse Ox % 99  Pulse Ox Activity Level  At rest  Oxygen Delivery Ventilator Assisted  Pulse Ox Heart Rate 90    18:00  Vital Signs Type Routine  Pulse Pulse 96  Respirations Respirations 16  Systolic BP Systolic  BP 336  Diastolic BP (mmHg) Diastolic BP (mmHg) 65  Mean BP 97  Pulse Ox % Pulse Ox % 98  Pulse Ox Activity Level  At rest  Oxygen Delivery 2L; Nasal Cannula; Ventilator Assisted  Pulse Ox Heart Rate 96   EXAM: General Exam Patient looks appropriate of age, well built, nourished and appropriately groomed in ICU sleeping, has bil fistulas on his arms,  Cardiovascular Exam: S1, S2 heart sounds present Carotid exam revealed no bruit Lung exam was clear to auscultation Fundoscopic exam attempted  Neurological Exam      Mental Status: drowsy but arousable, follows one step command, quite dysarthric (vs rencent extubation related dysphonia), knows his name and he is in hospital but not able to answer other questions, further higher cortical function test is not reliable.        Cranial Nerves:      Olfactory and vagus nerves not are examined      Pupils were reactive but he forces to keep his eyes closed. can't check  Extra-ocular movements.      Facial sensations are normal      Face is symmetric, can't check hearing reliablly,       Palate and uvular movements are normal and oral sensations are OK      Neck muscle strength and shoulder shrug is normal      Tongue protrusion and uvular elevation are normal       Motor Exam:      Tone is normal in all extremities lift both arms and legs on command but generalized weak (recent extubation)     No abnormal movements, fasciculations or atrophy seen       Deep Tendon Reflexes:      symmetric 2 + UE, absent LE. triple flexion with planter stimulation.            Sensory Exam:      Sensations were intact to light touch in all extremities                  Co-ordination:      Finger to nose is not reliable            Gait:      Gait and station can't check with generalized weakness in ICU.  Lab Results:  Thyroid:  12-Jan-15 03:44   Thyroid Stimulating Hormone 0.512 (0.45-4.50 (International Unit)  ----------------------- Pregnant  patients have  different reference  ranges for TSH:  - - - - - - - - - -  Pregnant, first trimetser:  0.36 - 2.50 uIU/mL)  LabObservation:  11-Jan-15 12:06   OBSERVATION Reason for Test  Hepatic:  12-Jan-15 03:44   Albumin, Serum  2.7  Bilirubin, Total 0.3  Alkaline Phosphatase 78 (45-117 NOTE: New Reference Range 03/05/13)  SGPT (ALT) 15  SGOT (AST) 21  Total Protein, Serum 6.5  Routine Chem:  11-Jan-15 01:45  Phosphorus, Serum  1.8 (Result(s) reported on 25 Apr 2013 at 10:30AM.)  Ethanol, S. < 3  Ethanol % (comp) < 0.003 (Result(s) reported on 25 Apr 2013 at 04:25AM.)  12-Jan-15 03:44   Glucose, Serum  146  BUN  52  Creatinine (comp)  9.18  Sodium, Serum 139  Potassium, Serum  3.0  Chloride, Serum 101  CO2, Serum 29  Calcium (Total), Serum  7.9  Anion Gap 9  Osmolality (calc) 294  eGFR (African American)  6  eGFR (Non-African American)  5 (eGFR values <67m/min/1.73 m2 may be an indication of chronic kidney disease (CKD). Calculated eGFR is useful in patients with stable renal function. The eGFR calculation will not be reliable in acutely ill patients when serum creatinine is changing rapidly. It is not useful in  patients on dialysis. The eGFR calculation may not be applicable to patients at the low and high extremes of body sizes, pregnant women, and vegetarians.)  Result Comment labs - This specimen was collected through an   - indwelling catheter or arterial line.  - A minimum of 527m of blood was wasted prior    - to collecting the sample.  Interpret  - results with caution.  Result(s) reported on 26 Apr 2013 at 04:01AM.  Magnesium, Serum 1.8 (1.8-2.4 THERAPEUTIC RANGE: 4-7 mg/dL TOXIC: > 10 mg/dL  -----------------------)  Cholesterol, Serum 113  Triglycerides, Serum 107  HDL (INHOUSE) 47  VLDL Cholesterol Calculated 21  LDL Cholesterol Calculated 45 (Result(s) reported on 26 Apr 2013 at 04:05AM.)  Cardiac:  11-Jan-15 01:45   CK, Total 88   CPK-MB, Serum  3.9 (Result(s) reported on 25 Apr 2013 at 03:01AM.)  Troponin I  0.07 (0.00-0.05 0.05 ng/mL or less: NEGATIVE  Repeat testing in 3-6 hrs  if clinically indicated. >0.05 ng/mL: POTENTIAL  MYOCARDIAL INJURY. Repeat  testing in 3-6 hrs if  clinically indicated. NOTE: An increase or decrease  of 30% or more on serial  testing suggests a  clinically important change)  Routine UA:  11-Jan-15 06:45   Color (UA) Yellow  Clarity (UA) Cloudy  Glucose (UA) >=500  Bilirubin (UA) Negative  Ketones (UA) Negative  Specific Gravity (UA) 1.015  Blood (UA) 3+  pH (UA) 6.0  Protein (UA) >=500  Nitrite (UA) Negative  Leukocyte Esterase (UA) 2+ (Result(s) reported on 25 Apr 2013 at 07:05AM.)  RBC (UA) 156 /HPF  WBC (UA) 56 /HPF  Bacteria (UA) TRACE  Epithelial Cells (UA) 1 /HPF (Result(s) reported on 25 Apr 2013 at 07:05AM.)  Routine Coag:  11-Jan-15 01:45   Prothrombin 12.1  INR 0.9 (INR reference interval applies to patients on anticoagulant therapy. A single INR therapeutic range for coumarins is not optimal for all indications; however, the suggested range for most indications is 2.0 - 3.0. Exceptions to the INR Reference Range may include: Prosthetic heart valves, acute myocardial infarction, prevention of myocardial infarction, and combinations of aspirin and anticoagulant. The need for a higher or lower target INR must be assessed individually. Reference: The Pharmacology and Management of the Vitamin K  antagonists: the seventh ACCP Conference on Antithrombotic and Thrombolytic Therapy. [id number].2426ept:126 (3suppl): [id number] HCT value >55% may artifactually increase the PT.  In one study,  the increase was an average of 25%. Reference:  "Effect on Routine and Special Coagulation Testing Values of Citrate Anticoagulant Adjustment in Patients with High HCT Values." American Journal of Clinical Pathology 2006;126:400-405.)  Routine Hem:  12-Jan-15 03:44   WBC  (CBC) 8.4  RBC (  CBC)  3.31  Hemoglobin (CBC)  10.2  Hematocrit (CBC)  31.0  Platelet Count (CBC) 151  MCV 94  MCH 30.8  MCHC 32.9  RDW  15.8  Neutrophil % 82.6  Lymphocyte % 6.9  Monocyte % 9.0  Eosinophil % 0.3  Basophil % 1.2  Neutrophil #  7.0  Lymphocyte #  0.6  Monocyte # 0.8  Eosinophil # 0.0  Basophil # 0.1   Radiology Results: CT:    11-Jan-15 03:36, CT Head Without Contrast  CT Head Without Contrast   REASON FOR EXAM:    CVA, Status epilepticus  COMMENTS:   May transport without cardiac monitor    PROCEDURE: CT  - CT HEAD WITHOUT CONTRAST  - Apr 25 2013  3:36AM     CLINICAL DATA:  Seizure.  Status epilepticus.    EXAM:  CT HEAD WITHOUT CONTRAST    TECHNIQUE:  Contiguous axial images were obtained from the base of the skull  through the vertex without intravenous contrast.    COMPARISON:  None.  FINDINGS:  The brain is atrophic with chronic microvascular ischemic change. A  remote right PCA territory infarct is seen. Remote right basal  ganglia and thalamic infarcts are also identified. No acute  abnormality including hemorrhage, infarction, mass lesion, mass  effect, midline shift or abnormal extra-axial fluid collection is  identified. There is no hydrocephalus or pneumocephalus. Scattered  ethmoid air cell disease is noted. There is mucosal thickening in  the left frontal sinus. There is a small, focal defect in the high  left parietal bone which is smoothly marginated and may be  postoperative in nature.     IMPRESSION:  No acute abnormality.  Atrophy, chronic microvascular ischemic change remote infarcts as  described.      Electronically Signed    By: Inge Rise M.D.    On: 04/25/2013 03:58         Verified By: Ramond Dial, M.D.,   Radiology Impression: Radiology Impression: - ct HEAD - severe generalized cortical atrophy, ventriculomegaly, severe WM ischmemic changes, focal encephalomalacia in right parietal (MCA  distribution) and bil thalamic and putamen lacunar infracts.   Impression/Recommendations: Recommendations:   1) Michael Acosta is an 81 yo man with ESRD on HD and prior strokes who presented to [facility] with unresponsiveness and two episodes of trembling activity of the mouth that was concerning for ongoing seizure activity. He has no prior seizure history though has a history of prior strokes. His neurologic exam currently is confounded by recent administration of high-dose propofol and is therefore not revealing.   previous strokes : shows several chronic infarcts, both lacunar and embolic: bil thalamic (Rt bigger than left), bilateral basal ganglia, and discrete areas of R parieto-occipital encephalomalacia, all suggestive of remote infarcts. There is no hemorrhage or new large terirtorial infarct. Brain MRI without contrast to evaluate for new ischemic infarctRecommend vessel imaging with neck and brain MRA (non contrast time-of-flight)decreased levetiracetam to 500 mg qday and 500 mg after each dialysis. will review EEG when available. you for the opportunity to participate in Michael Acosta care. We will continue to follow along with you.  Electronic Signatures: Ray Church (MD)  (Signed 27-Jan-15 10:31)  Authored: REFERRING PHYSICIAN, Primary Care Physician, Consult, History of Present Illness, Review of Systems, PAST MEDICAL/SURGICAL HISTORY, HOME MEDICATIONS, ALLERGIES, Social/Family History, NURSING VITAL SIGNS, Physical Exam-, LAB RESULTS, RADIOLOGY RESULTS, Recommendations   Last Updated: 27-Jan-15 10:31 by Ray Church (MD)

## 2014-08-06 NOTE — Consult Note (Signed)
Chief Complaint:  Subjective/Chief Complaint "I have not had another bowel movement. I feel good.' He denies, chest pain,. SOB, abdominal pain or tenderness. "The only thing bothering me is my foot." " I am having a colonoscopy tomorrow and I will get my dialysis today instead of tomorrow." His family is not at bedside or answering their home phone to discuss this case with them. Patient is aware of the process of the bowel prep and sedation for a colonoscopy and thinks he can do it.   VITAL SIGNS/ANCILLARY NOTES: **Vital Signs.:   24-Mar-15 10:36  Vital Signs Type Routine  Temperature Temperature (F) 98.3  Celsius 36.8  Temperature Source oral  Pulse Pulse 75  Respirations Respirations 20  Systolic BP Systolic BP 203  Diastolic BP (mmHg) Diastolic BP (mmHg) 40  Mean BP 75  Pulse Ox % Pulse Ox % 95  Pulse Ox Activity Level  At rest  Oxygen Delivery Room Air/ 21 %   Brief Assessment:  GEN well developed, disheveled   Cardiac Regular   Respiratory normal resp effort  clear BS   Gastrointestinal details normal Soft  Nontender  Nondistended   EXTR positive heel ulcer- dressed and not examined   Assessment/Plan:  Assessment/Plan:  Assessment 1. Recurrent bright red rectal bleeding with personal history of adenomatous polyps and colectomy, prostate cancer  treatment and Flex sig showing AVMs. Stable Hgb, vital signs, no further bleeding today. He is on cl liq diet awaiting bowel prep instructions.  2. ESRD on hemodialysis 3. dementia diagnosis 4. DM 5. CVA 6. Seizure disorder   Plan Tentative plans for colonoscopy tomorrow per Dr. Gustavo Lah schedule. He will need Propofol sedation and consider abx prohylaxis secondary to A-V fistula shunts. Hemodialysis today to free up time for tomorrow and the will resume normal M-W-F schedule. I was unable to talk with his family about doing the colonoscopy.   Electronic Signatures for Addendum Section:  [name] (NP) (Signed  Addendum 24-Mar-15 17:09)  Dr. Rayann Heman will do the colonoscopy approx 11am tomorrow. No orders for abx needed. Bowel prep this eve.   Electronic Signatures: [name] (NP)  (Signed 24-Mar-15 16:04)  Authored: Chief Complaint, VITAL SIGNS/ANCILLARY NOTES, Brief Assessment, Assessment/Plan   Last Updated: 24-Mar-15 17:09 by [name] (NP)

## 2014-08-06 NOTE — Consult Note (Signed)
Details:   - GI follow up  S: Reports no futher bleeding.  Denies abd pain, nv/.  Exam: Chest: cta, no w/c CV: reg, no m/r/g Abd: nt,nd,nabs, soft Extr: 1+ swell bilat  A/P:   Bleeding now resolved per pt.  Hgb down slightly today.  - flex tomorrow with fleets enema x 2 prep to see if AVM still present. Will require full prep if AVM present.   Electronic Signatures: Arther Dames (MD)  (Signed 18-Jan-15 17:28)  Authored: Details   Last Updated: 18-Jan-15 17:28 by Arther Dames (MD)

## 2014-08-06 NOTE — Op Note (Signed)
PATIENT NAME:  Michael Acosta, Michael Acosta MR#:  [id number] DATE OF BIRTH:  October 12, 1929  DATE OF PROCEDURE:  07/09/2013  PREOPERATIVE DIAGNOSIS: Atherosclerotic occlusive disease, bilateral lower extremities, with gangrene of the left foot.   POSTOPERATIVE DIAGNOSIS: Atherosclerotic occlusive disease, bilateral lower extremities, with gangrene of the left foot.   PROCEDURES PERFORMED: 1.  Abdominal aortogram.  2.  Left lower extremity distal runoff, third order catheter placement.  3.  Percutaneous transluminal angioplasty to 4 mm, left popliteal and proximal bypass graft.  4.  Percutaneous transluminal angioplasty to 2 to 3 mm, left peroneal.  5.  Insertion of right femoral triple-lumen catheter for venous access, with ultrasound guidance.   PROCEDURE PERFORMED BY: Hortencia Pilar, M.D.   SEDATION: Versed 4 mg plus fentanyl 100 mcg administered IV. Continuous ECG, pulse oximetry and cardiopulmonary monitoring is performed throughout the entire procedure by the interventional radiology nurse. Total sedation time is 1 hour, 20 minutes.   ACCESS: A 6 French sheath, right common femoral artery.   FLUOROSCOPY TIME: 10.9 minutes.   CONTRAST USED: Isovue 65 mL.   INDICATIONS: Mr. Zelek is an 81 year old gentleman with multiple medical problems, who presented to the hospital with rectal bleeding. Also has known atherosclerotic occlusive disease, with gangrene of the left heel. He is undergoing imaging to determine whether his vasculature is reconstruct-able so that improved flow might allow for healing of his wound. Risks and benefits were reviewed. The patient has agreed to proceed.   DESCRIPTION OF PROCEDURE: The patient is taken to special procedures and placed in the supine position. After adequate prepping and draping, ultrasound is placed in a sterile sleeve. The femoral vein is identified. It is echolucent and compressible, indicating patency. Image is recorded for the permanent record. Under direct  ultrasound visualization, common femoral vein is identified. Lidocaine is infiltrated, then a micropuncture needle is inserted without difficulty, microwire followed by micro-sheath. Counter-insertion J-wire followed by dilator and then triple-lumen catheter. All 3 lumens aspirate and flush easily. The catheter is secured to the skin of the thigh with 2-0 silk, and a sterile dressing is applied. Sterile extension tube was then passed off the field, and conscious sedation is administered,   After appropriate level of sedation has been achieved, the right groin is once again examined with ultrasound. This time, examining the femoral artery, it is echolucent and pulsatile, indicating patency. Image is recorded. Under direct ultrasound visualization, 1% lidocaine is infiltrated in the soft tissues, and a micropuncture needle was inserted directly into the anterior wall of the common femoral artery. Microwire followed by micro-sheath, stiffened micropuncture sheath is required. J-wire followed by a 5 French sheath and 5 French pigtail catheter. The pigtail catheter and J-wire were advanced to the level of T12. Wire is removed. AP projection of the aorta is obtained. Pigtail catheter is repositioned to above the bifurcation, and an RAO projection of the pelvis is obtained.   Using a stiff angled Glidewire and the pigtail catheter, the aortic bifurcation is crossed. The catheter is advanced down to the distal external iliac, where LAO projection of the left groin is obtained. Utilizing this image for landmarks, the Glidewire is reintroduced and the catheter is negotiated into the SFA. Distal runoff is then obtained.   After review of these images, the stiff angled Glidewire is reintroduced and negotiated down to the mid-popliteal. The catheter and 5 French sheath were removed, and 4000 units of heparin is given. A 6 [place] Rabi is advanced up and over the bifurcation, positioned with  its tip in the SFA.   Using a  straight slip catheter and the Glidewire, the catheter/wire combination is negotiated into the peroneal through the bypass, which appears to be a mid-popliteal to proximal peroneal bypass. Subsequently, the straight catheter is exchanged for an 0.018 CXI catheter, as the peroneal artery is too small to allow passage of the catheter itself. Catheter/wire combination is negotiated down to the level of the ankle, where hand injection of contrast is utilized to demonstrate the distal runoff. There are several large branches of the peroneal, which do appear to collateralize with a short segment of the lateral plantar.   A v-18 wire is then introduced, and a 2 mm x 20 cm balloon is advanced, positioned distally in the peroneal, and inflated to 14 atmospheres. Inflation is for 2 minutes. A 3 x 6 is then used to complete the peroneal angioplasty. The 3 x 6 is inflated to 14 atmospheres for 2 minutes as well.   A 4 x 15 balloon is then advanced over the wire and positioned across the mid popliteal, extending into the bypass graft itself for approximately 5 cm. Inflation is to 17 atmospheres for 2 minutes. Follow up angiography through the Rabi sheath now demonstrates good patency of the popliteal and the vein bypass. Imaging is still somewhat poor of the peroneal distally, and therefore the straight catheter is advanced down to the distal level of the distal anastomosis is placed. Selective injection of the peroneal is utilized, demonstrating patency of the peroneal, with now direct filling of the several large branches previously mentioned.   The 18 wire and catheter were removed. The sheath is pulled back into the common iliac on the right. The J-wire is advanced. The sheath is then repositioned, and an RAO projection of the right groin is obtained. StarClose device is deployed, with success. There are no immediate complications.   INTERPRETATION: The common femoral vein is echolucent and compressible. Central line  is placed without difficulty.   The abdominal aorta demonstrates diffuse disease, as does the common iliacs and the external iliacs bilaterally. However, there are no hemodynamically significant stenoses noted.   The left common femoral and profunda femoris are patent, again demonstrating diffuse disease, but no focal hemodynamically significant stenoses. The SFA is patent throughout its course. At [facility] canal, there is mild irregularity, with approximately a 30% to 40% diameter reduction. It does not appear to be hemodynamically significant. The mid popliteal demonstrates a branch which later appears more consistent with a mid-popliteal to proximal peroneal vein bypass. The anterior tibial and posterior tibial are nonvisualized completely throughout their course. Peroneal is diffusely diseased, with several segments of occlusion. There does appear to be several collaterals, which fill a short segment of the lateral plantar.   Following angioplasty to 2 mm with a 3 mm balloon dilatation at the very proximal-most segment, there is wide patency of the peroneal and much improved filling down to the ankle. Following angioplasty to 4 mm of the mid popliteal into the bypass graft, there is now resolution of the multiple 60% and 70% lesions noted there.   SUMMARY: Successful recanalization of the left lower extremity with treatment of a previously-placed bypass graft as well as the peroneal artery.    ____________________________ Katha Cabal, MD ggs:mr D: 07/09/2013 15:50:00 ET T: 07/09/2013 21:55:45 ET JOB#: [id number]  cc: Gerrit Heck Troxler, DPM [place] Brynda Greathouse, MD Katha Cabal, MD, <Dictator> Dr. Tammi Klippel MD ELECTRONICALLY SIGNED 08/03/2013  11:14 

## 2014-08-06 NOTE — H&P (Signed)
PATIENT NAME:  BENJAMIN, CASANAS MR#:  [id number] DATE OF BIRTH:  [birth year]  DATE OF ADMISSION:  06/07/2013  PRIMARY CARE PHYSICIAN: Dr. Larene Beach.   REFERRING PHYSICIAN: Dr. Dahlia Client.   CHIEF COMPLAINT: Bright red blood per rectum.   HISTORY OF PRESENT ILLNESS: Mr. Cashman is an 81 year old African American male with a past medical history of end-stage renal disease on hemodialysis, Monday, Wednesday, Friday schedule.  Previous history of CVA, recurrent seizures, presented to the Emergency Department with complaints of bright red blood per rectum. The patient is extremely poor historian,  unable to obtain any history from the patient. The history is mainly obtained from the previous records as well as from the Emergency Department physician. Per the Emergency Department physician, the patient was having bright red blood all day long at the nursing home. Concerning this, the patient was sent to the Emergency Department. When the Emergency Department physician was examining him, had a large pool of blood in the diaper with clots. The patient's hemoglobin is stable at 9.8. The patient had a similar admission in January 2015. Had flexible sigmoidoscopy done. At that time the patient was found to have diagnosed with (Dictation Anomaly)<<MISSING TEXT>>  The patient also had a history of polyp. Underwent hemicolectomy in the past.   PAST MEDICAL HISTORY: 1. End-stage renal disease on hemodialysis Monday, Wednesday, Friday schedule. 2. Diabetes mellitus.  3. Hypertension.  4. CVA x 3.  5. Recurrent seizures.  6. Prostate cancer.  7. Colon polyps status post hemicolectomy.  8. Dementia.   PAST SURGICAL HISTORY: 1. Partial colon resection for polyps.  2. AV fistula for dialysis access.   ALLERGIES: ASPIRIN AND BC POWDER AND PENICILLIN .   FAMILY HISTORY: Significant for hypertension per records.   HOME MEDICATIONS: 1. Sucralfate 1 gram 2 times a day.  2. Simvastatin 40 mg once a day.  3.  Omeprazole 2 times a day.  4. (Dictation Anomaly)<<MISSING TEXT>>  2 times a day.  5. Metoprolol 25 mg 2 times a day.  6. Keppra 500 mg once a day.  7. Keppra 500 mg Monday, Wednesday, Friday after dialysis.  8. Levemir 12 units subcutaneous 2 times a day.  9. Docusate sodium 100 mg 2 times a day.  10. Calcium 3 capsules 3 times a day.  11. Amlodipine 5 mg once a day.  12. Tylenol 2 tablets every four hours as needed.   REVIEW OF SYSTEMS: Could not be obtained from the patient as the patient is frequently falling asleep, unable to obtain the review of systems.  PHYSICAL EXAMINATION: GENERAL: This is a Neurosurgeon Anomaly)<<MISSING TEXT>>  age-appropriate male, lying down in the bed, not in distress.  VITAL SIGNS: Temperature 98, pulse 78, blood pressure 192/86, respiratory rate of 16, oxygen saturation 100% on 2 liters of oxygen.  HEENT: Head normocephalic, atraumatic.  EYES: No scleral icterus. Conjunctivae normal. Pupils equal and react to light. Extraocular movements are intact. Mucous membranes moist. No pharyngeal erythema.  NECK: Supple. No lymphadenopathy. No JVD. No carotid bruit.  CHEST: No focal tenderness.  LUNGS: Bilaterally clear to auscultation.  HEART: S1, S2 regular. No murmurs are heard.  ABDOMEN: Bowel sounds present. Soft, nontender, nondistended, currently does not show any rectal bleeding.  EXTREMITIES: No pedal edema. Pulses 2+.  NEUROLOGIC: The patient is oriented to place, person, could not tell the date, _____ frequently falling asleep. Could not examine the motor and sensory as the patient was not cooperative.   LABORATORY DATA: CBC: WBC 7.2, hemoglobin  9.8, platelet count of 93, rest of all the values are within normal limits.   CMP: BUN 22, creatinine of (Dictation Anomaly)<<MISSING TEXT>> ,  rest of all the values are within normal limits.   Coag profile is within normal limits.   ASSESSMENT AND PLAN: Mr. Tanzi is an 81 year old male who comes to the  Emergency Department with bright red blood per rectum.  1. Lower gastrointestinal bleed. Possibility of diverticulosis and with the possibility of diverticular bleed or AV malformations. The patient's hemoglobin is stable. Per Emergency Department physician requested surgery to place a central line, who felt the patient's hemoglobin is stable. Vital signs are well within normal limits. We will follow up with H and H, if stable the patient could be discharged back to the nursing home.  2. End stage renal disease. The patient had dialysis yesterday; however, we will request nephrology consult.  3. Hypertension. Continue with home medications. Amlodipine.  4. Keep the patient on deep vein thrombosis prophylaxis with SCDs.   TIME SPENT: 50 minutes.     ____________________________ Monica Becton, MD pv:sg D: 06/08/2013 04:58:50 ET T: 06/08/2013 06:44:14 ET JOB#: [id number]  cc: Monica Becton, MD, <Dictator> Arlis Porta., MD

## 2014-08-06 NOTE — Op Note (Signed)
PATIENT NAME:  Michael Acosta, Michael Acosta MR#:  [id number] DATE OF BIRTH:  1929-06-23  DATE OF PROCEDURE:  08/08/2013  PREOPERATIVE DIAGNOSIS: Displaced intertrochanteric fracture of the left hip.   POSTOPERATIVE DIAGNOSIS: Displaced intertrochanteric fracture of the left hip.   PROCEDURE PERFORMED: Open reduction and internal fixation of the left hip with a Synthes trochanteric fixation nail (125 degrees/11-mm rod, 90-mm spiral blade, 40-mm distal screw).   SURGEON: Dr. Sabra Heck.  ANESTHESIA: General LMA.   COMPLICATIONS: None.   DRAINS: None.   ESTIMATED BLOOD LOSS: 75 mL. Replaced: None.   DESCRIPTION OF PROCEDURE: The patient was brought to the Operating Room where he underwent satisfactory general LMA anesthesia in the supine position. He was placed on the fracture table and the right leg was flexed and abducted. The left leg was flexed, internally rotated and then placed in gentle traction. He was adducted. Fluoroscopy showed good position of the fracture fragments. Left hip was prepped and draped in a sterile fashion and a short longitudinal incision was made just above the trochanter. Dissection was carried out sharply through subcutaneous tissue and fascia. The tip of the trochanter was identified and a guidepin inserted. The large 17-mm reamer was used to open the proximal femur. The guidepin was then advanced down the shaft. A 125-degree x 11-mm fixation nail was passed down the shaft to the appropriate depth. The guide pins were removed. A second incision was then made distally, and the aiming device advanced to the lateral cortex of the femur. The guidepin was inserted into the head and neck in excellent position in the center of the head on AP and lateral views. The lateral cortex was drilled. The long reamer was used to open up the canal for the spiral blade. A 90-mm spiral blade was then inserted fully. The set screw was advanced from the top and tightened snugly. The traction was released.  The distal aiming device was inserted and a distal incision made. This was advanced to the lateral cortex and the distal screw hole was drilled. A 40-mm cortical screw was inserted and tightened snugly. Fluoroscopy showed all hardware to be in excellent position and the fracture reduced in excellent position. The outriggers were removed. The wounds were irrigated and the deep fascia closed with 0 Vicryl. Subcutaneous tissue was closed with 2-0 Vicryl and skin was closed with staples. Drains were not used. A dry sterile dressing was applied. The patient was taken out of traction and then transferred to a hospital bed. He had good range of motion of the hip without any crepitus. He was taken to recovery in good condition.    ____________________________ Park Breed, MD hem:lt D: 08/08/2013 15:47:12 ET T: 08/09/2013 04:47:37 ET JOB#: [id number]  cc: Park Breed, MD, <Dictator> Park Breed MD ELECTRONICALLY SIGNED 08/09/2013 16:53

## 2014-08-06 NOTE — Consult Note (Signed)
CHIEF COMPLAINT and HISTORY:  Subjective/Chief Complaint Nonhealing ulceration left heel   History of Present Illness 81 year old male admitted 3/22 for 'blood in stools.'  He has ESRD and diabetes. He has a nonhealing ulceration of the left heel. Patient reports throbbing to this site. It is documented that he has dementia. He believes it has been present for about 2 weeks. According to documentation- family reports that it has been present for a few months. He has been seen by podiatry and found to have nonpalpable pulses. We were consulted for vascular assessment.   PAST MEDICAL/SURGICAL HISTORY:  Past Medical History:   ESRD:    Dementia:    HTN:    Diabetes:    GI Bleed:    Seizures:    blood transfusion:    prostate CA:    CVA:    AV Fistula:    colon resection:   ALLERGIES:  Allergies:  PCN: Dizzy/Fainting  Aspirin: Bleeding  BC: Unknown  HOME MEDICATIONS:  Home Medications: Medication Instructions Status  Levemir FlexTouch 100 units/mL subcutaneous solution 12 unit(s) subcutaneous 2 times a day Active  levETIRAcetam 500 mg oral tablet 1 tab(s) orally Monday, Wednesday, and Friday after dialysis Active  levETIRAcetam 500 mg oral tablet 1 tab(s) orally once a day Active  acetaminophen 325 mg oral tablet 2 tabs (611m) orally every 4 hours as needed for pain/temp. greater than 100.4 Active  Metoprolol Tartrate 25 mg oral tablet 1 tab(s) orally 2 times a day for htn Active  omeprazole 20 mg oral delayed release tablet 1 tab(s) orally 2 times a day Active  NovoLOG FlexPen 100 units/mL subcutaneous solution inject per sliding scale subcutaneous 4 times a day (before meals and at bedtime). :if 0-200=0 units, 201-250=4 units, 251-300=6 units, 301-350=8 units, 351-400=10 units, >400=Call MD Active  sucralfate 1 g oral tablet 1 tab(s) orally 4 times a day for gerd/retention for bowels Active  docusate sodium sodium 100 mg oral tablet 1 tab(s) orally 2 times a day, As  Needed -constipation Active  amLODIPine 5 mg oral tablet 1 tab(s) orally once a day Active  atorvastatin 20 mg oral tablet 1 tab(s) orally once a day Active   Family and Social History:  Family History Diabetes Mellitus   Social History negative tobacco, negative ETOH, negative Illicit drugs   Place of Living Nursing Home   Review of Systems:  Fever/Chills No   Cough No   Abdominal Pain No   Diarrhea No   Constipation No   Nausea/Vomiting No   SOB/DOE No   Chest Pain No   Physical Exam:  GEN no acute distress   HEENT PERRL, hearing intact to voice, moist oral mucosa   NECK supple  No masses   RESP normal resp effort  clear BS   CARD regular rate   ABD denies tenderness  soft  normal BS   EXTR Nonpalpable left pedal pulses   SKIN Left heel ulceration   NEURO cranial nerves intact   PSYCH alert   LABS:  Laboratory Results: Hepatic:    22-Mar-15 20:20, Comprehensive Metabolic Panel  Bilirubin, Total 0.3  Alkaline Phosphatase 124  45-117  NOTE: New Reference Range  03/05/13  SGPT (ALT) 33  SGOT (AST) 35  Total Protein, Serum 7.4  Albumin, Serum 3.4  Routine BB:    22-Mar-15 20:20, Type and Antibody Screen  ABO Group + Rh Type   O Positive  Antibody Screen NEGATIVE  Result(s) reported on 04 Jul 2013 at 09:43PM.  General Ref:    23-Mar-15 14:41, HBsAg  HBsAg   ========== TEST NAME ==========  ========= RESULTS =========  = REFERENCE RANGE =    HEPATITIS B SURFACE AG    HBsAg Screen  HBsAg Screen                    [   Negative             ]          Negative                  [facility]            No: [id number]            [address], [place], [place] [address]            Lindon Romp, MD         [phone]     Result(s) reported on 06 Jul 2013 at 01:16PM.  Routine Chem:    22-Mar-15 20:20, Comprehensive Metabolic Panel  Glucose, Serum 175  BUN 52  Creatinine (comp) 8.48  Sodium, Serum 131  Potassium, Serum  4.9  Chloride, Serum 96  CO2, Serum 28  Calcium (Total), Serum 7.9  Osmolality (calc) 281  eGFR (African American) 6  eGFR (Non-African American) 5  eGFR values <49m/min/1.73 m2 may be an indication of chronic  kidney disease (CKD).  Calculated eGFR is useful in patients with stable renal function.  The eGFR calculation will not be reliable in acutely ill patients  when serum creatinine is changing rapidly. It is not useful in   patients on dialysis. The eGFR calculation may not be applicable  to patients at the low and high extremes of body sizes, pregnant  women, and vegetarians.  Anion Gap 7    23-Mar-15 14:41, Phosphorus, Serum  Phosphorus, Serum 3.7  Result(s) reported on 05 Jul 2013 at 03:03PM.    24-Mar-15 14:18, Potassium, Serum  Potassium, Serum 4.3  Result(s) reported on 06 Jul 2013 at 03:47PM.    26-Mar-15 06:05, Hemoglobin  Result Comment   HGB - SPECIMEN CLOTTED-NOTIFIED CESIELY   - FERGUSON _0  07-08-13 FOR RECOLLECT   - AJO   Result(s) reported on 08 Jul 2013 at [facility]    26-Mar-15 06:29, Hemoglobin  Result Comment   HGB - SPECIMEN CLOTTED   Result(s) reported on 08 Jul 2013 at 06:45AM.  Routine Hem:    22-Mar-15 20:20, Hemogram, Platelet Count  WBC (CBC) 6.0  RBC (CBC) 3.51  Hemoglobin (CBC) 10.3  Hematocrit (CBC) 32.2  Platelet Count (CBC) 181  Result(s) reported on 04 Jul 2013 at 08:54PM.  MCV 92  MCH 29.4  MCHC 31.9  RDW 16.6    23-Mar-15 05:12, CBC Profile  WBC (CBC) 5.7  RBC (CBC) 3.32  Hemoglobin (CBC) 9.6  Hematocrit (CBC) 30.4  Platelet Count (CBC) 171  MCV 92  MCH 28.9  MCHC 31.6  RDW 17.1  Neutrophil % 76.8  Lymphocyte % 10.5  Monocyte % 10.7  Eosinophil % 1.0  Basophil % 1.0  Neutrophil # 4.4  Lymphocyte # 0.6  Monocyte # 0.6  Eosinophil # 0.1  Basophil # 0.1  Result(s) reported on 05 Jul 2013 at [facility]    24-Mar-15 06:39, Hemoglobin  Hemoglobin (CBC) 9.7  Result(s) reported on 06 Jul 2013 at 07:11AM.    25-Mar-15  14:44, Hemoglobin  Hemoglobin (CBC) 10.1  Result(s) reported on 07 Jul 2013 at 03:07PM.    26-Mar-15  06:05, Hemoglobin  Hemoglobin (CBC) -    26-Mar-15 06:29, Hemoglobin  Hemoglobin (CBC) -    26-Mar-15 07:07, Hemoglobin  Hemoglobin (CBC) 10.0  Result(s) reported on 08 Jul 2013 at 07:34AM.   RADIOLOGY:  Radiology Results: XRay:    25-Mar-15 11:25, Foot Left AP and Lateral  Foot Left AP and Lateral  REASON FOR EXAM:    left heel ulcer  COMMENTS:       PROCEDURE: DXR - DXR FOOT LEFT AP AND LATERAL  - Jul 07 2013 11:25AM     CLINICAL DATA:  Left heel ulcer    EXAM:  LEFT FOOT - 2 VIEW    COMPARISON:  None.    FINDINGS:  Two views of the left foot submitted. No acute fracture or  subluxation. Diffuse osteopenia. There is skin defect probable ulcer  in posterior heel region. No periosteal reaction or bony erosion to  suggest osteomyelitis.     IMPRESSION:  No evidence of osteomyelitis. Diffuse osteopenia. No acute fracture  or subluxation.      Electronically Signed    By: Lahoma Crocker M.D.    On: 07/07/2013 13:02         Verified By: Ephraim Hamburger, M.D.,  LabUnknown:    17-Jan-15 08:47, Chest Portable Single View  PACS Image    25-Mar-15 11:25, Foot Left AP and Lateral  PACS Image   ASSESSMENT AND PLAN:  Assessment/Admission Diagnosis Nonhealing ulceration of the left heel which has been present several weeks to months. Nonpalpable pedal pulses.  D/w Dr. Delana Meyer- recommend proceeding with left leg angiogram with intervention to optimize arterial flow for healing of the left heal ulcerations. Dr. Delana Meyer would be able to do this tomorrow. Discussed angiogram with patient. I will get in touch with his wife today to discuss with her as well.   Electronic Signatures for Addendum Section:  Su Grand (PA-C) (Signed Addendum 26-Mar-15 13:15)  Left message with his wife to call me back on discussing angiogram. We will wait on colonoscopy results as well to  help determine best timing of angiogram keeping in mind GI bleed.   Electronic Signatures: Su Grand (PA-C)  (Signed 26-Mar-15 09:47)  Authored: Chief Complaint and History, PAST MEDICAL/SURGICAL HISTORY, ALLERGIES, HOME MEDICATIONS, Family and Social History, Review of Systems, Physical Exam, LABS, RADIOLOGY, Assessment and Plan   Last Updated: 26-Mar-15 13:15 by Su Grand (PA-C)

## 2014-08-06 NOTE — Consult Note (Signed)
PATIENT NAME:  Michael Acosta, Michael Acosta MR#:  [id number] DATE OF BIRTH:  02-22-1930  DATE OF CONSULTATION:  07/07/2013  CONSULTING PHYSICIAN:  Gerrit Heck. Smrithi Pigford, DPM  REASON FOR CONSULTATION:  Diabetic decubitus heel ulcer, left heel. Family states it has been there for a couple of months. He has been getting it dressed at [facility], but has had minimal progression of the area.   PAST MEDICAL HISTORY:  Includes end-stage renal disease on hemodialysis, diabetes, hypertension, history of CVA x 3 with what looks like CABG with saphenous vein graft removed from his left leg; history of recurrent seizures, prostate cancer, colon polyps and dementia.   PAST SURGICAL HISTORY: Includes partial colon resection and AV fistula for dialysis.   ALLERGIES: INCLUDE ASPIRIN AND PENICILLIN.   HOME MEDICATIONS: Include sucralfate, omeprazole, NovoLog, metoprolol, Keppra, senna, atorvastatin, amlodipine, acetaminophen.   SOCIAL HISTORY: The patient has been living at the skilled nursing at [facility]. Denies any history of smoking or illicit drug use or EtOH. He has a little bit of dementia, but he is pleasant and responsive. His family is in the room with him and they are able to answer questions about him.  His vital signs today include his temperature is 98.6, pulse 76, respirations 19. Blood pressure is 120/97.   Lower extremity exam shows nonpalpable pulses bilaterally; left is definitely not palpable, some ulcerative changes to the posterior left heel. There is a dark black eschar on the left heel that is about 3 cm in diameter. He has got a dressing on it right now, but is going to probably need debridement of this at some point. I am concerned about his circulation and his vascular capacity of that left foot at this timeframe.  CLINICAL IMPRESSION: Diabetic decubitus ulceration with necrotic eschar, left heel, likely peripheral arterial disease as well.   TREATMENT PLAN: We will get a  consult from Drs. Dew and Schnier for a vascular evaluation to see if they think he could be a candidate for an angioplasty to try to improve his blood flow to that area, then we will have to follow up shortly thereafter with debridement of the wound and removal of the eschar. I will see if they can get done tomorrow and discuss it with him after that.    ____________________________ Gerrit Heck. Ether Wolters, DPM mgt:dmm D: 07/07/2013 17:28:47 ET T: 07/07/2013 19:28:37 ET JOB#: [id number]  cc: Gerrit Heck Terrell Ostrand, DPM, <Dictator> Perry Mount MD ELECTRONICALLY SIGNED 08/10/2013 12:50

## 2014-08-06 NOTE — Consult Note (Signed)
Referring Physician:  Nicholes Mango :   Primary Care Physician:  Nonlocal MD, MD :   Reason for Consult: Admit Date: 25-Apr-2013  Chief Complaint: New onset seizures  Reason for Consult: seizure   History of Present Illness: History of Present Illness:   Michael Acosta is an 81 yo man with PMH notable for prior stroke and ESRD on IHD who presented to [facility] ED this morning for management of witnessed convulsive activity. The patient is intubated and sedated so the history is gathered by phone from his wife, and from the medical record.  was in his usual state of health and woke in the middle of the night to use the restroom. When he left the restroom, he went to the living room and lay down in the recliner, which is not unusual for him. About 10 minutes later, his wife went to check on him and found that his mouth was trembling. His eyes were closed and he did not have any head turning or movements of his limbs. She was unable to arouse him by voice or by touching his face, and so called EMS. The trembling episode stopped, and recurred again en route to the hospital. Upon arrival to [facility], he was found to be poorly responsive and was intubated for airway protection. He was loaded with levetiracetam and started on a high-dose propofol infusion (160/hr) for treatment of presumed status epilepticus. Since admission, he has not had any further episodes concerning for seizure activity. his baseline, he has speech difficulties and vision difficulties from 3 prior strokes that his wife knows of. His first stroke was in 2002, and to his wife's knowledge his physicians have not discovered what has caused his strokes. He has not had a history of seizures in the past, febrile seizures, or CNS trauma or infection. His wife does not believe he has any family history of epilepsy.  ROS:  Review of Systems   Unable to obtain due to intubation and sedation. However, his wife denies any recent illness or infection,  though does not that for several months his gait has become slower and more unsteady, particularly after dialysis.  Past Medical/Surgical Hx:  blood transfusion:   prostate CA:   CVA:   dialysis:   colon resection:   Home Medications: Medication Instructions Last Modified Date/Time  omeprazole 20 mg oral delayed release capsule 1 cap(s) orally once a day 06-Dec-14 16:01  levofloxacin 500 milligram(s) orally every 48 hours 06-Dec-14 16:01  calcium acetate 667 mg oral capsule 3 cap(s) orally 3 times a day 06-Dec-14 16:01  metoprolol 25 mg oral tablet, extended release .5 tab(s) orally twice a day   (am and noon) 06-Dec-14 16:01  pravastatin 20 mg oral tablet 1 tab(s) orally once a day (at bedtime) 06-Dec-14 16:01  Iron 1044m 1   once a day evening 06-Dec-14 16:01   Allergies:  PCN: Dizzy/Fainting  Aspirin: Bleeding  Social/Family History: Lives With: spouse  Social History: No history of EtOH use  Family History: No family history of seizures   Vital Signs: **Vital Signs.:   11-Jan-15 10:00  Vital Signs Type Routine  Pulse Pulse 48  Respirations Respirations 14  Systolic BP Systolic BP 1793 Diastolic BP (mmHg) Diastolic BP (mmHg) 51  Mean BP 72  Pulse Ox % Pulse Ox % 100  Pulse Ox Activity Level  At rest  Oxygen Delivery Ventilator Assisted  Pulse Ox Heart Rate 48   EXAM: Intubated and sedated. Well-developed, well-nourished. Mild scleral edema is present.  No carotid bruits auscultated. Normal S1, S2 and regular cardiac rhythm on exam. Lungs clear to auscultation bilaterally. Abdomen soft and nondistended. Bilateral A-V fistulas are present in the forearms. No clubbing, cyanosis, or edema in extremities.  NEUROLOGIC EXAM: Performed after holding high-dose propofol for 10 minutes, so residual medication effect confounds the reliability of any focal findings  MENTAL STATUS: Unresponsive. Does not open eyes to pain. No motor response. GCS = 3T (E:1, V:1T, M:1) CRANIAL  NERVES: Pupils 71m and sluggishly reactive bilaterally. Absent blink-to-threat. Corneal reflexes are present bilaterally, though left is very weak and almost unobtainable. Gaze is midline. Oculocephalic reflex is absent. Weak gag and cough reflexes are present. MOTOR/SENSORY: No motor response to pain. REFLEXES: 1+ in biceps, triceps, patella, and absent achilles bilaterally. Left extensory plantar response, right flexor plantar response. COORDINATION/GAIT: Unable to assess.  Lab Results:  Hepatic:  11-Jan-15 01:45   Bilirubin, Total 0.3  Alkaline Phosphatase  174 (45-117 NOTE: New Reference Range 03/05/13)  SGPT (ALT) 27  SGOT (AST) 26  Total Protein, Serum 7.9  Albumin, Serum 3.4  Routine Chem:  11-Jan-15 01:45   Result Comment TROPONIN/GLUCOSE - RESULTS VERIFIED BY REPEAT TESTING.  - C/CHAD FOGLEMAN/0305/04-25-13/[facility] GLUCOSE - NOTIFIED OF CRITICAL VALUE TROPONIN/GLUCOSE - READ-BACK PROCESS PERFORMED.  Result(s) reported on 25 Apr 2013 at 03:15AM.  Glucose, Serum  564  BUN  45  Creatinine (comp)  7.41  Sodium, Serum  134  Potassium, Serum 4.3  Chloride, Serum  97  CO2, Serum 27  Calcium (Total), Serum  8.1  Osmolality (calc) 306  eGFR (African American)  7  eGFR (Non-African American)  6 (eGFR values <656mmin/1.73 m2 may be an indication of chronic kidney disease (CKD). Calculated eGFR is useful in patients with stable renal function. The eGFR calculation will not be reliable in acutely ill patients when serum creatinine is changing rapidly. It is not useful in  patients on dialysis. The eGFR calculation may not be applicable to patients at the low and high extremes of body sizes, pregnant women, and vegetarians.)  Anion Gap 10  Magnesium, Serum  1.7 (1.8-2.4 THERAPEUTIC RANGE: 4-7 mg/dL TOXIC: > 10 mg/dL  -----------------------)  Phosphorus, Serum  1.8 (Result(s) reported on 25 Apr 2013 at 10:30AM.)  Ethanol, S. < 3  Ethanol % (comp) < 0.003 (Result(s) reported on  25 Apr 2013 at 04:25AM.)  Cardiac:  11-Jan-15 01:45   CK, Total 88  CPK-MB, Serum  3.9 (Result(s) reported on 25 Apr 2013 at 03:01AM.)  Troponin I  0.07 (0.00-0.05 0.05 ng/mL or less: NEGATIVE  Repeat testing in 3-6 hrs  if clinically indicated. >0.05 ng/mL: POTENTIAL  MYOCARDIAL INJURY. Repeat  testing in 3-6 hrs if  clinically indicated. NOTE: An increase or decrease  of 30% or more on serial  testing suggests a  clinically important change)  Routine UA:  11-Jan-15 06:45   Color (UA) Yellow  Clarity (UA) Cloudy  Glucose (UA) >=500  Bilirubin (UA) Negative  Ketones (UA) Negative  Specific Gravity (UA) 1.015  Blood (UA) 3+  pH (UA) 6.0  Protein (UA) >=500  Nitrite (UA) Negative  Leukocyte Esterase (UA) 2+ (Result(s) reported on 25 Apr 2013 at 07:05AM.)  RBC (UA) 156 /HPF  WBC (UA) 56 /HPF  Bacteria (UA) TRACE  Epithelial Cells (UA) 1 /HPF (Result(s) reported on 25 Apr 2013 at 07:05AM.)  Routine Coag:  11-Jan-15 01:45   Prothrombin 12.1  INR 0.9 (INR reference interval applies to patients on anticoagulant therapy. A single INR therapeutic range  for coumarins is not optimal for all indications; however, the suggested range for most indications is 2.0 - 3.0. Exceptions to the INR Reference Range may include: Prosthetic heart valves, acute myocardial infarction, prevention of myocardial infarction, and combinations of aspirin and anticoagulant. The need for a higher or lower target INR must be assessed individually. Reference: The Pharmacology and Management of the Vitamin K  antagonists: the seventh ACCP Conference on Antithrombotic and Thrombolytic Therapy. ZOXWR.6045 Sept:126 (3suppl): [id number]. A HCT value >55% may artifactually increase the PT.  In one study,  the increase was an average of 25%. Reference:  "Effect on Routine and Special Coagulation Testing Values of Citrate Anticoagulant Adjustment in Patients with High HCT Values." American Journal of Clinical  Pathology 2006;126:400-405.)  Routine Hem:  11-Jan-15 01:45   WBC (CBC) 5.6  RBC (CBC)  3.68  Hemoglobin (CBC)  11.3  Hematocrit (CBC)  35.5  Platelet Count (CBC)  148 (Result(s)reported on 25 Apr 2013 at 02:42AM.)  MCV 96  MCH 30.7  MCHC  31.9  RDW  16.2   Radiology Results: CT:    11-Jan-15 03:36, CT Head Without Contrast  CT Head Without Contrast   REASON FOR EXAM:    CVA, Status epilepticus  COMMENTS:   May transport without cardiac monitor    PROCEDURE: CT  - CT HEAD WITHOUT CONTRAST  - Apr 25 2013  3:36AM     CLINICAL DATA:  Seizure.  Status epilepticus.    EXAM:  CT HEAD WITHOUT CONTRAST    TECHNIQUE:  Contiguous axial images were obtained from the base of the skull  through the vertex without intravenous contrast.    COMPARISON:  None.  FINDINGS:  The brain is atrophic with chronic microvascular ischemic change. A  remote right PCA territory infarct is seen. Remote right basal  ganglia and thalamic infarcts are also identified. No acute  abnormality including hemorrhage, infarction, mass lesion, mass  effect, midline shift or abnormal extra-axial fluid collection is  identified. There is no hydrocephalus or pneumocephalus. Scattered  ethmoid air cell disease is noted. There is mucosal thickening in  the left frontal sinus. There is a small, focal defect in the high  left parietal bone which is smoothly marginated and may be  postoperative in nature.     IMPRESSION:  No acute abnormality.  Atrophy, chronic microvascular ischemic change remote infarcts as  described.      Electronically Signed    By: Inge Rise M.D.    On: 04/25/2013 03:58         Verified By: Ramond Dial, M.D.,   Impression/Recommendations: Recommendations:   Michael Acosta is an 81 yo man with ESRD on HD and prior strokes who presented to [facility] with unresponsiveness and two episodes of trembling activity of the mouth that was concerning for ongoing seizure activity. He has  no prior seizure history though has a history of prior strokes. His neurologic exam currently is confounded by recent administration of high-dose propofol and is therefore not revealing. Noncontrast head CT, by my interpretation, shows several chronic infarcts, both lacunar and embolic: R thalamic, bilateral basal ganglia, and discrete areas of R parieto-occipital encephalomalacia, all suggestive of remote infarcts. There is no hemorrhage or new large terirtorial infarct. history of unresponsiveness and mouth trembling is not clearly consistent with an epileptic seizure, though this remains high on the differential diagnosis. Other considerations include a new ischemic stroke, including possibly a brainstem infarction which is sometimes difficult to visualize on CT due  to bony artifact, especially in the hyperacute/acute time frame. A severe toxic/metabolic encephalopathy is also on the differential, though far less likely given the apparent abrupt onset of symptoms. Brain MRI without contrast to evaluate for new ischemic infarctRecommend vessel imaging with neck and brain MRA (non contrast time-of-flight)EEG monitoring to evaluate for ongoing seizure activityContinue levetiracetam 559m Q12; this is essentially max dose in an anuric patient and so I would recommend adding a second agent such as valproic acid or phenytoin should seizure activity recurConsider beginning to wean propofol and extubating. Neurologic exam off sedation will be diagnostically helpful you for the opportunity to participate in Mr. SVangordercare. We will continue to follow along with you.   Electronic Signatures: HCarmin Richmond(MD)  (Signed 11-Jan-15 11:48)  Authored: REFERRING PHYSICIAN, Consult, History of Present Illness, Review of Systems, PAST MEDICAL/SURGICAL HISTORY, HOME MEDICATIONS, ALLERGIES, Social/Family History, NURSING VITAL SIGNS, Physical Exam-, LAB RESULTS, RADIOLOGY RESULTS, Recommendations SRay Church(MD)  (Signed 12-Jan-15 10:44)  Authored: Primary Care Physician, History of Present Illness, Review of Systems   Last Updated: 12-Jan-15 10:44 by SRay Church(MD)

## 2014-08-06 NOTE — Discharge Summary (Signed)
PATIENT NAME:  Michael Acosta, Michael Acosta MR#:  [id number] DATE OF BIRTH:  01-Dec-1929  DATE OF ADMISSION:  05/01/2013 DATE OF DISCHARGE:  05/05/2013  REASON FOR ADMISSION: Rectal bleeding.   CONSULTANTS: Arther Dames, MD  DISPOSITION: Skilled nursing facility.   FINAL DIAGNOSES:  1.  Gastrointestinal bleed, lower, secondary to radiation proctitis.  2.  Recurring seizures.  3.  History of multiple cerebrovascular accidents with right-sided weakness.  4.  End-stage renal disease, on dialysis Monday/Wednesday/Friday.   5.  Diabetes, insulin-dependent.  6.  Acute blood loss anemia status post transfusion.   MEDICATIONS AT DISCHARGE:  1.  Calcium acetate 3 capsules three times a day.  2.  Keppra 500 mg every day with an extra dose on Monday/Wednesday/Friday after dialysis.  3.  Mupirocin topical 2 times daily.  4.  Metoprolol 25 mg twice daily.  5.  Insulin Levemir 12 units subcutaneously twice daily.  6.  Tylenol as needed for pain.  7.  Insulin sliding scale with NovoLog.  8.  Plavix 75 mg, hold for 2 weeks until cleared by Dr. Carmina Miller. 9.  Docusate 1 capsule twice daily.  10.  Sucralfate 1 g twice daily per rectum as an enema.  11.  Omeprazole 20 mg twice a day.   HOSPITAL COURSE: This is a very nice 81 year old gentleman who has history of multiple medical problems including previous CVAs in the past that left him with right side weakness. He had new-onset seizures on his last hospitalization. He required to be intubated but he was successfully extubated. The patient has a history of renal disease and is on dialysis on Monday/Wednesday/Friday.  He came to the hospital on 05/01/2013 right after he was discharged from his stroke. Had bright red blood per rectum after being started on Plavix. Was diagnosed with radiation proctitis from previous prostate cancer treatment. Dr. Arther Dames was consulted on the case. He reviewed the case and established that the patient already had a previous  colonoscopy and upper endoscopy for what he decided to proceed with a flex sigmoidoscopy. This showed poor colon preparations with erythematous mucosae with oozing into the distal rectum likely secondary to radiation proctitis. It was recommended for the patient to start using enemas with Carafate.   He developed significant acute blood loss anemia. His admission hemoglobin was 9.3, dropped down to 8 and at discharge was 8.5. He continues to have occasional maroon-like bowel movements with occasional acute blood but seems to be resolved.   The patient is somewhat tolerating the enemas and at this moment since his hemoglobin has been stable, he is going to be discharged.   IMPORTANT LABORATORY RESULTS: Hemoglobin 8.5 at discharge. Admission was 9.3, and back on January 14 was 10.2. His creatinine was 9.68, sodium 132, BUN 50, albumin levels 2.   The patient has been overall stable. His vital signs have not shown any significant signs of acute bleeding. His blood pressure actually is raised 170s over 60s. Heart rate is 79 and the patient has not been complaining of any dizziness/lightheadedness or orthostatic disease.   The patient will be transferred back to skilled nursing facility where he is going to continue to do his rehab and he requires to continue enemas with Carafate. Followup hemoglobins and closely monitor.   TIME SPENT: I spent about 45 minutes with this patient on discharge today.   ____________________________ [place] Sink, MD rsg:np D: 05/05/2013 15:18:15 ET T: 05/05/2013 20:12:47 ET JOB#: [id number]  cc: Dendron Sink, MD, <Dictator> Cassey Hurrell  America Brown MD ELECTRONICALLY SIGNED 05/09/2013 8:27

## 2014-08-06 NOTE — Consult Note (Signed)
Brief Consult Note: Diagnosis: rectal bleeding intermittent in a patient with know radiation proctitis.   Patient was seen by consultant.   Consult note dictated.   Comments: Hgb is stable- actually good for this patient. Recommend carafate enemas as previously ordered. Recent Flex sig x 2 with discussion to proceed with full bowel prep and colonoscopy to treat mult angioectasias if bleeding continues despite the carafate enema treatment. Pateint witout diarrhea or pain. Not sure if he has been receiving the enmas bid in the nursing home.  Electronic Signatures: [name] (NP)  (Signed 23-Mar-15 17:28)  Authored: Brief Consult Note   Last Updated: 23-Mar-15 17:28 by [name] (NP)

## 2014-08-06 NOTE — Discharge Summary (Signed)
PATIENT NAME:  Michael Acosta, Michael Acosta MR#:  [id number] DATE OF BIRTH:  [birth year]  DATE OF ADMISSION:  11/26/2013 DATE OF DISCHARGE:  11/30/2013  ATTENDING PHYSICIAN: Dr. Valentino Nose.  DISCHARGING PHYSICIAN: Gladstone Lighter, M.D.    PRIMARY M.D.: Dr. Brynda Greathouse at [facility].  [place]: Nephrology consultation with Dr. Juleen China and Dr. Holley Raring.    DISCHARGE DIAGNOSES:  1. Sepsis.  2. Clostridium difficile colitis.  3. End-stage renal disease on Monday, Wednesday, Friday hemodialysis.  4. Hypertension.  5. Diabetes mellitus.  6. Peripheral vascular disease.  7. History of cerebrovascular accident.  8. Seizure disorder.  9. Dementia.  10. Anemia of chronic disease  11. Metabolic encephalopathy.  DISCHARGE MEDICATIONS:  1. Keppra 500 mg p.o. daily.  2. NovoLog FlexPen sliding scale insulin as per the discharge instructions.  3. Atorvastatin 20 mg p.o. at bedtime.  4. RisaQuad probiotic capsule p.o. b.i.d.  5. Sucralfate 1 gram p.o. 4 times a day.  6. Vitamin C 500 mg p.o. b.i.d.  7. Omeprazole 20 mg p.o. daily.  8. Renvela 800mg  1tab PO 3times per day.  9. Norco 5/325 mg orally q.6 h. p.r.n. for pain.  10. Promethazine 25 mg q.6 h. p.r.n. for nausea and vomiting.  11. Metoprolol 25 mg p.o. b.i.d.  12. Levemir 10 units subcutaneous at bedtime.  13. Flagyl 500 mg p.o. every 8 hours for 8 days.  14. Vancomycin 125 mg/2.5 mL orally q.6 h. for 8 more days.   DISCHARGE DIET: Renal diet.   DISCHARGE ACTIVITY: As tolerated.    FOLLOWUP INSTRUCTIONS:  1. PCP follow-up in 1 week.  2. For dialysis tomorrow.   LABORATORIES AND IMAGING STUDIES PRIOR TO DISCHARGE: Sodium 139, potassium 4.0, chloride 99, bicarbonate 28, BUN 37, creatinine 5.81, glucose 237, calcium of 8.2.   WBC 11.1, hemoglobin 8.7, hematocrit 28.8, platelet count 187,000.  Echocardiogram Doppler showing normal LV ejection fraction, ejection fraction of 55% to 60%, mild LVH noted.   Stool for  C. difficile antigen and toxins are positive on 11/28/2013.   Chest x-ray showing worsening aeration, mild subsegmental atelectatic changes, increased cardiomediastinal silhouette, and low lung volumes noted.  Troponins elevated secondary to demand ischemia.   Hepatitis B surface antigen is negative.   Blood cultures remained negative.   BRIEF HOSPITAL COURSE: Michael Acosta is an 81 year old, elderly African American male, with past medical history significant for end-stage renal disease on Monday, Wednesday, Friday hemodialysis, dementia, hypertension, diabetes, history of CVA and seizures, who is from [facility] and was sent in secondary to tachycardia and low-grade fevers.   1. Sepsis secondary to C. difficile colitis. Blood cultures have remained negative. Initially was on vancomycin and Zosyn, both are discontinued now, and the patient has started on Flagyl and vancomycin. His mental status has improved a lot and his metabolic encephalopathy, which was secondary to his infection, has resolved. His white count is improving. His temperatures are mostly in the 99 degrees to 98 degrees farenheit, except 1 temperature of 100.15F here. His diarrhea has also been improving on antibiotics. He will be on 8 more days, which includes 10 days treatment for his treatment for his C. difficile colitis.  2. Hypertension. Blood pressure is actually on the lower side due to his sepsis. Metoprolol has been restarted, but hydralazine and Norvasc are still on hold.  3. Episode of atrial fibrillation with rapid ventricular response in the hospital secondary to sepsis; however, that resolved with 1 dose of IV Cardizem and the patient has  remained in normal sinus rhythm. His metoprolol is being resumed.  4. End-stage renal disease on hemodialysis. He was dialyzed per his schedule while in the hospital and was also followed by nephrology.  5. Anemia of chronic disease, stable.  6. Diabetes mellitus with  poor p.o. intake. Sugars were on the lower side. His Levemir is being dose reduced at the time of discharge and he will also be on sliding scale insulin.   His course has been otherwise uneventful in the hospital.   DISCHARGE CONDITION: Stable. Baseline status is bed-bound and wheelchair-bound.   DISCHARGE DISPOSITION: To skilled nursing facility at [facility].   CODE STATUS: Full Code.  TIME SPENT ON DISCHARGE: 45 minutes.    ____________________________ Gladstone Lighter, MD rk:JT D: 11/30/2013 12:11:26 ET T: 11/30/2013 13:21:39 ET JOB#: [id number]  cc: Gladstone Lighter, MD, <Dictator> Mikeal Hawthorne. Brynda Greathouse, MD Gladstone Lighter MD ELECTRONICALLY SIGNED 11/30/2013 14:18

## 2014-08-06 NOTE — Consult Note (Signed)
PATIENT NAME:  SHELL, YANDOW MR#:  [id number] DATE OF BIRTH:  04/25/1929  DATE OF CONSULTATION:  04/25/2013  CONSULTING PHYSICIAN: Dionisio David, M.D.   INDICATION FOR CONSULTATION: Supraventricular tachycardia, atrial fibrillation and bradycardia.   HISTORY OF PRESENT ILLNESS: This is an 81 year old African American male with multiple medical problems came into the hospital with an episode of seizure requiring intubation for airway control, currently unable to give any history due to intubation and sedation. The patient has on the monitor sinus bradycardia, rate of 58 per minute. I was asked to evaluate because of sinus bradycardia, frequent PVCs and occasional ST-T, concern is that the patient may have had supraventricular tachycardia or ventricular tachycardia leading to seizures. Right now the patient appears to be hemodynamically stable. Blood pressure is stable. Heart rate is around 58 and sinus rhythm.   PAST MEDICAL HISTORY: He has a history of GI bleed history of paroxysmal atrial fibrillation, end-stage renal disease gets dialysis 3 times a week. History of renal mass, history of diabetes, hypertension and cerebral vascular accident, history of prostate cancer.   MEDICATIONS: In the past has been pravastatin, metoprolol, , omeprazole. Right now he is not taking any medications to lower his heart rate such as metoprolol.   ALLERGIES: None.   PHYSICAL EXAMINATION: GENERAL: The patient is alert and oriented x 0. His blood pressure right now is 114/51, respirations 14, pulse 58, saturation is 100%.  NECK: Positive jugular venous distention.  LUNGS: There is a good air entry. No rales or wheezing.  HEART: Regular rate and rhythm. Normal S1, S2. No audible murmur.  ABDOMEN: Soft, nontender, positive bowel sounds.  EXTREMITIES: No pedal edema.  NEUROLOGIC: The patient appears to be alert and oriented x 0.   LABORATORY, DIAGNOSTIC, AND RADIOLOGICAL DATA: EKG is not available.    LABORATORY DATA: Show his troponin is mildly elevated at 0.07 creatinine is 7.41, BUN is 45. Glucose is 564. His ethanol level is less than three. Troponin is 0.07, phosphorus is 1.8, which is low. Magnesium is 1.7, which is also low.   ASSESSMENT AND PLAN: The patient has seizure disorder, also has some electrolyte abnormalities with low phosphorus and magnesium. I advised getting EKG, and an echocardiogram. Currently hemodynamically stable, heart rate is 58, occasional PVCs on the monitor. I would not give any beta blockers because of sinus bradycardia and I would replace magnesium and phosphorus. We will give 2 grams of magnesium sulfate. We will follow closely with you. Right now no intervention is necessary. Thank you very much for the referral.   ____________________________ Dionisio David, MD sak:sg D: 04/25/2013 11:22:42 ET T: 04/25/2013 12:05:52 ET JOB#: [id number]  cc: Dionisio David, MD, <Dictator> Dionisio David MD ELECTRONICALLY SIGNED 04/28/2013 10:11

## 2014-08-06 NOTE — Consult Note (Signed)
PATIENT NAME:  Michael Acosta, Michael Acosta MR#:  [id number] DATE OF BIRTH:  11-20-1929  DATE OF CONSULTATION:  08/10/2013  CONSULTING PHYSICIAN:  Dionisio David, MD  INDICATION FOR CONSULTATION: Atrial fibrillation.   HISTORY OF PRESENT ILLNESS: This is an 81 year old African American male with a past medical history of hypertension, insulin requiring diabetes, history of CVA, mild dementia, end-stage renal disease, who presented to the hospital on 04/25 with an episode where he fell and broke his left hip.  He had left trochanteric hip fracture. He just had internal fixation done by orthopedic, but initially was in sinus rhythm and went into atrial fibrillation. I was asked to evaluate the patient because of episode of atrial fibrillation. The patient denies any chest pain, shortness of breath, PND, orthopnea, or fluttering in the chest.   PAST MEDICAL HISTORY: As mentioned above.   HOME MEDICATIONS: Levemir insulin, Lipitor 20, metoprolol 25 b.i.d., Norvasc 10 mg hydralazine 50 q.6 hours and Protonix.   SOCIAL HISTORY: Denies EtOH abuse or smoking.   FAMILY HISTORY: Positive for hypertension. No history of coronary artery disease.   REVIEW OF SYSTEMS: Unremarkable.   PHYSICAL EXAMINATION: GENERAL: He is alert and oriented x 3, in no acute distress right now.  VITAL SIGNS: Blood pressure 120/63, respirations 18, pulse right now 79. Monitor shows normal sinus rhythm. Temperature is 99.3 and saturation is 95.  NECK: No JVD.  LUNGS: Good air entry. No rales or rhonchi.  HEART: Regular rate, rhythm. Normal S1, S2. No audible murmur.  ABDOMEN: Soft, nontender, positive bowel sounds.  EXTREMITIES: No pedal edema.   LABORATORIES AND STUDIES: EKG shows atrial fibrillation, 106 per minute with nonspecific ST-T changes. The monitor as mentioned now has sinus rhythm. His labs are significant that he has BUN 52, creatinine are 7.78 and glucose is 136.   ASSESSMENT AND PLAN: The patient has paroxysmal atrial  fibrillation due to internal fixation of the left intertrochanteric fracture. This is quite common after surgery. Currently, he is taking metoprolol 25 b.i.d. along with hydralazine and has come back into sinus rhythm. Advised continuation of heparin subcutaneous, get echocardiogram, and if he has another episode of atrial fibrillation we will start the patient on amiodarone in the meantime, because of paroxysmal atrial fibrillation due to orthopedic surgery does not need long-term anticoagulation or antiarrhythmic drugs at this time. We will follow the patient with you and follow-up in the office also after discharge.  Thank you very much for referral.   ____________________________ Dionisio David, MD sak:sg D: 08/10/2013 08:28:54 ET T: 08/10/2013 08:46:55 ET JOB#: [id number]  cc: Dionisio David, MD, <Dictator> Dionisio David MD ELECTRONICALLY SIGNED 08/20/2013 11:05

## 2014-08-06 NOTE — Consult Note (Signed)
Chief Complaint:  Subjective/Chief Complaint seen for hematochezia.  denies abdominal pain or nausea, tolerating full liquids.  no recurrent hematochezia with brown bm earlier today.   VITAL SIGNS/ANCILLARY NOTES: **Vital Signs.:   28-Mar-15 14:00  Temperature Temperature (F) 97.3  Celsius 36.2  Temperature Source oral  Pulse Pulse 72  Respirations Respirations 19  Systolic BP Systolic BP 948  Diastolic BP (mmHg) Diastolic BP (mmHg) 58  Mean BP 98  Pulse Ox % Pulse Ox % 94  Pulse Ox Activity Level  At rest  Oxygen Delivery Room Air/ 21 %  *Intake and Output.:   28-Mar-15 00:01  Stool  Had a BM, moderate amount of soft stool with blood.    04:49  Stool  Had a BM, small amount of soft stool.    09:07  Stool  1, moderate soft    16:19  Stool  1, SMALL -MEDIUM SIZE   Brief Assessment:  Cardiac Regular   Respiratory clear BS   Gastrointestinal details normal Soft  Nontender  Nondistended  No masses palpable  Bowel sounds normal   Lab Results: Routine Chem:  28-Mar-15 05:19   Result Comment LABS - This specimen was collected through an   - indwelling catheter or arterial line.  - A minimum of 28mls of blood was wasted prior    - to collecting the sample.  Interpret  - results with caution.  Result(s) reported on 10 Jul 2013 at 05:38AM.  Routine Hem:  28-Mar-15 05:19   WBC (CBC) 5.7  RBC (CBC)  3.41  Hemoglobin (CBC)  10.1  Hematocrit (CBC)  30.8  Platelet Count (CBC) 176  MCV 90  MCH 29.5  MCHC 32.7  RDW  16.8  Neutrophil % 82.4  Lymphocyte % 4.3  Monocyte % 10.7  Eosinophil % 1.8  Basophil % 0.8  Neutrophil # 4.7  Lymphocyte #  0.2  Monocyte # 0.6  Eosinophil # 0.1  Basophil # 0.0   Assessment/Plan:  Assessment/Plan:  Assessment 1) rectal bleeding-not recurrent s/p treatment of distal rectal avms.  2) multiple medical problems as noted, wht CKD/HD, h/o prostate ca, htn, dm. heel ulcer.   Plan 1) continue full liquids today and tomorrow.  advance to  low residue after that if there is no further bleeding. following.   Electronic Signatures: Loistine Simas (MD)  (Signed 28-Mar-15 17:07)  Authored: Chief Complaint, VITAL SIGNS/ANCILLARY NOTES, Brief Assessment, Lab Results, Assessment/Plan   Last Updated: 28-Mar-15 17:07 by Loistine Simas (MD)

## 2014-08-06 NOTE — H&P (Signed)
PATIENT NAME:  Michael Acosta, Michael Acosta MR#:  [id number] DATE OF BIRTH:  1929/10/13  DATE OF ADMISSION:  06/08/2013  PRIMARY CARE PHYSICIAN: Dr. Larene Beach.   REFERRING PHYSICIAN: Dr. Dahlia Client.   CHIEF COMPLAINT: Bright red blood per rectum.   HISTORY OF PRESENT ILLNESS: Michael Acosta is an 81 year old African American male with a past medical history of end-stage renal disease on hemodialysis, Monday, Wednesday, Friday schedule.  Previous history of CVA, recurrent seizures, presented to the Emergency Department with complaints of bright red blood per rectum. The patient is extremely poor historian,  unable to obtain any history from the patient. The history is mainly obtained from the previous records as well as from the Emergency Department physician. Per the Emergency Department physician, the patient was having bright red blood all day long at the nursing home. Concerning this, the patient was sent to the Emergency Department. When the Emergency Department physician was examining him, had a large pool of blood in the diaper with clots. The patient's hemoglobin is stable at 9.8. The patient had a similar admission in January 2015. Had flexible sigmoidoscopy done. At that time the patient was found to have diagnosed with radiation proctitis.  The patient also had a history of polyp. Underwent hemicolectomy in the past.   PAST MEDICAL HISTORY: 1. End-stage renal disease on hemodialysis Monday, Wednesday, Friday schedule. 2. Diabetes mellitus.  3. Hypertension.  4. CVA x 3.  5. Recurrent seizures.  6. Prostate cancer.  7. Colon polyps status post hemicolectomy.  8. Dementia.   PAST SURGICAL HISTORY: 1. Partial colon resection for polyps.  2. AV fistula for dialysis access.   ALLERGIES: ASPIRIN AND BC POWDER AND PENICILLIN .   FAMILY HISTORY: Significant for hypertension per records.   HOME MEDICATIONS: 1. Sucralfate 1 gram 2 times a day.  2. Simvastatin 40 mg once a day.  3. Omeprazole 2  times a day.  4. Mupirocin 2 times a day.  5. Metoprolol 25 mg 2 times a day.  6. Keppra 500 mg once a day.  7. Keppra 500 mg Monday, Wednesday, Friday after dialysis.  8. Levemir 12 units subcutaneous 2 times a day.  9. Docusate sodium 100 mg 2 times a day.  10. Calcium 3 capsules 3 times a day.  11. Amlodipine 5 mg once a day.  12. Tylenol 2 tablets every four hours as needed.   REVIEW OF SYSTEMS: Could not be obtained from the patient as the patient is frequently falling asleep, unable to obtain the review of systems.  PHYSICAL EXAMINATION: GENERAL: This is a well-built, well-nourished, age-appropriate male, lying down in the bed, not in distress.  VITAL SIGNS: Temperature 98, pulse 78, blood pressure 192/86, respiratory rate of 16, oxygen saturation 100% on 2 liters of oxygen.  HEENT: Head normocephalic, atraumatic.  EYES: No scleral icterus. Conjunctivae normal. Pupils equal and react to light. Extraocular movements are intact. Mucous membranes moist. No pharyngeal erythema.  NECK: Supple. No lymphadenopathy. No JVD. No carotid bruit.  CHEST: No focal tenderness.  LUNGS: Bilaterally clear to auscultation.  HEART: S1, S2 regular. No murmurs are heard.  ABDOMEN: Bowel sounds present. Soft, nontender, nondistended, currently does not show any rectal bleeding.  EXTREMITIES: No pedal edema. Pulses 2+.  NEUROLOGIC: The patient is oriented to place, person, could not tell the date, _____ frequently falling asleep. Could not examine the motor and sensory as the patient was not cooperative.   LABORATORY DATA: CBC: WBC 7.2, hemoglobin 9.8, platelet count of 93, rest  of all the values are within normal limits.   CMP: BUN 22, creatinine of 5.61,  rest of all the values are within normal limits.   Coag profile is within normal limits.   ASSESSMENT AND PLAN: Michael Acosta is an 81 year old male who comes to the Emergency Department with bright red blood per rectum.  1. Lower gastrointestinal  bleed. Possibility of diverticulosis and with the possibility of diverticular bleed or AV malformations. The patient's hemoglobin is stable. Per Emergency Department physician requested surgery to place a central line, who felt the patient's hemoglobin is stable. Vital signs are well within normal limits. We will follow up with H and H, if stable the patient could be discharged back to the nursing home.  2. End stage renal disease. The patient had dialysis yesterday; however, we will request nephrology consult.  3. Hypertension. Continue with home medications. Amlodipine.  4. Keep the patient on deep vein thrombosis prophylaxis with SCDs.   TIME SPENT: 50 minutes.     ____________________________ Monica Becton, MD pv:sg D: 06/08/2013 04:58:00 ET T: 06/08/2013 06:44:14 ET JOB#: [id number]  cc: Arlis Porta., MD Monica Becton, MD, <Dictator>    Grier Mitts Deannah Rossi MD ELECTRONICALLY SIGNED 06/20/2013 22:36

## 2014-08-06 NOTE — Consult Note (Signed)
Brief Consult Note: Diagnosis: diabetic\decubitis left heel ulcer.   Patient was seen by consultant.   Consult note dictated.   Recommend further assessment or treatment.   Comments: will dress area today.  May need surgical debridement.  Needs better heel protection for left foot.  Electronic Signatures: Perry Mount (MD)  (Signed 25-Mar-15 17:12)  Authored: Brief Consult Note   Last Updated: 25-Mar-15 17:12 by Perry Mount (MD)

## 2014-08-06 NOTE — Consult Note (Signed)
GI follow up. dnies any futher bleeding today.  No abd pain , n/v, diarrhea. no w/cno m/r/gswelling 8.5 to 8.1 today.   bleeding has resolved. THe bleeding is from radiation procitits.cont sucrafate enemasif further drop in hgb or heavy bleeding,  will require full colon prep for APC treament of radiation procitits.   Electronic Signatures: Arther Dames (MD)  (Signed on 20-Jan-15 19:04)  Authored  Last Updated: 20-Jan-15 19:04 by Arther Dames (MD)

## 2014-08-06 NOTE — Consult Note (Signed)
Details:   - GI Note.  Flex sig done today for hematochezia.  Findings:   -erythema and oozing blood in distal rectum.  - unable to treat with APC due to unprepped colon and explosion risk with unprepped colon.  - this is likely radiation proctitis.   Recs: -sucrafate enema 1 gr dissolved in 1 tbsp water BID - monitor hgb untiil stable and as outpatient.  - if further bleeding or drop in Hgb, will need full colon prep to allow for APC treatment.   Electronic Signatures: Arther Dames (MD)  (Signed 19-Jan-15 15:29)  Authored: Details   Last Updated: 19-Jan-15 15:29 by Arther Dames (MD)

## 2014-08-06 NOTE — Discharge Summary (Signed)
PATIENT NAME:  Michael Acosta, Michael Acosta MR#:  [id number] DATE OF BIRTH:  [birth year]  DATE OF ADMISSION:  06/08/2013 DATE OF DISCHARGE:  06/10/2013  ADMISSION DIAGNOSES: Bright red blood per rectum.   DISCHARGE DIAGNOSES:   1. Bright red blood per rectum, likely secondary to radiation proctitis.  2. End-stage renal disease. 3. Seizure disorder.  4. Hypertension.   CONSULTATIONS:  1. GI.  2. Nephrology.   LABORATORIES AT DISCHARGE: C. difficile was negative. White blood cell 4.9, hemoglobin 9, hematocrit 28, platelets 191.   HOSPITAL COURSE: This is an 81 year old male with a past medical history of end-stage renal disease on hemodialysis and recently diagnosed radiation proctitis, who presents with bright red blood per rectum. For further details, please refer to the H and P. 1.  Bright red blood per rectum. The patient had a sigmoidoscopy back in January 2015 which showed radiation proctitis. GI was consulted regarding his bright red blood per rectum. His hemoglobin actually remained stable here. He did not require any blood transfusions. The  working diagnosis is that this is secondary to again radiation proctitis.  GI recommended sucralfate enemas if he becomes unstable. If hemoglobin drops they recommended a full colonoscopy, however, his hemoglobin remained stable here, he had no bleeding here.  2. End-stage renal disease, on hemodialysis. The patient did receive dialysis. 3. Hypertension,, the patient was continued on his outpatient medications.  4. Seizure disorder. The patient was continued on his medications. No seizures in the hospital.   DISCHARGE MEDICATIONS: 1. Calcium acetate 667 3 tablets t.i.d.  2. Keppra 500 mg Monday, Wednesday, Friday after dialysis.  3. Simvastatin 40 mg at bedtime. 4. Mupirocin b.i.d. topical 5. Metoprolol 25 mg twice a day. 6. Insulin 12 units b.i.d.   7. Tylenol 325 2 tablets q.4 hours p.r.n. pain or temperature.  8. Sliding scale insulin.  9. Docusate  100 mg b.i.d. p.r.n.  10. Sucralfate 1 gram b.i.d. per rectum as a retention enema.  11. Omeprazole 20 mg b.i.d.  12. Norvasc  5 m gdaily  DISCHARGE DIET: ADA diet, low sodium  DISCHARGE ACTIVITY: As tolerated.   DISCHARGE REFERRAL: Physical therapy.   DISCHARGE FOLLOW-UP: The patient will follow up with Dr. Brynda Greathouse in one week.   TIME SPENT: 35 minutes.   The patient is medically stable for discharge.    ____________________________ Yaresly Menzel P. Benjie Karvonen, MD spm:sg D: 06/10/2013 13:49:21 ET T: 06/10/2013 14:05:29 ET JOB#: [id number]  cc: Gracelee Stemmler P. Benjie Karvonen, MD, <Dictator> Mikeal Hawthorne. Brynda Greathouse, MD  Donell Beers Shenaya Lebo MD ELECTRONICALLY SIGNED 06/10/2013 16:33

## 2014-08-06 NOTE — Discharge Summary (Signed)
PATIENT NAME:  Michael Acosta, GENEST MR#:  [id number] DATE OF BIRTH:  [birth year]  DATE OF ADMISSION:  04/25/2013 DATE OF DISCHARGE:  04/29/2013   ADMITTING PHYSICIAN: Nicholes Mango, MD  DISCHARGING PHYSICIAN: Gladstone Lighter, MD  PRIMARY PHYSICIAN: Arlis Porta., MD  DISCHARGE DIAGNOSES:  1. Acute toxic metabolic encephalopathy.  2. Underlying vascular dementia.  3. Seizure disorder.  4. Old multiple cerebrovascular accidents.  5. End-stage renal disease, on Monday, Wednesday, Friday hemodialysis.  6. Hypertension.  7. Diabetes mellitus.  8. Acute respiratory failure for airway protection.  9. History of prostate cancer, status post radiation.   DISCHARGE HOME MEDICATIONS: 1. Calcium acetate 3 capsules orally 3 times a day.  2. Omeprazole 20 mg p.o. daily.  3. Keppra 500 mg p.o. daily.  4. Keppra 500 mg on dialysis days, Monday, Wednesday and Friday, after dialysis.  5. Simvastatin 40 mg p.o. daily.  6. Plavix 75 mg p.o. daily.  7. Mupirocin topical ointment intranasally twice a day.  8. Metoprolol 25 mg p.o. b.i.d.  9. Insulin Levemir 12 units subcutaneous twice a day.   DISCHARGE DIET: Low-sodium diet with dysphagia 1 puree diet, nectar thick liquids.   DISCHARGE ACTIVITY: As tolerated.    FOLLOWUP INSTRUCTIONS:  1. PCP followup in 1 to 2 weeks.  2. Physical therapy.  3. Neurology followup in 2 to 3 weeks.   CONSULTATIONS IN THE HOSPITAL:  1. Neurology consultation by Dr. Jennings Books.  2. Cardiology consultation by Dr. Neoma Laming.  3. Nephrology consultation by Dr. Anthonette Legato. 4. Pulmonary/critical care consultation by Dr. Mortimer Fries.  LABORATORY AND IMAGING STUDIES PRIOR TO DISCHARGE:  EEG showing generalized slowing indicative of dementia and toxic metabolic encephalopathy. There is presence of generalized multifocal epileptiform discharges.  Sodium 141, potassium 3.5, chloride 102, bicarbonate 30, BUN 46, creatinine 8.37, glucose 188 and calcium of 8.4.  WBC  7.6, hemoglobin 10.3, hematocrit 30.8, platelet count 171.  MRI of the brain without contrast showing no acute intracranial abnormality. Generalized white matter disease and atrophy and remote lacunar infarcts of the basal ganglia, corona radiata and cerebellum bilaterally noted.   LDL cholesterol 45, HDL 47, total cholesterol 113, triglycerides of 107. TSH 0.512. Echo Doppler showing LV ejection fraction of 60% to 65%, impaired relaxation of LV diastolic filling and severely dilated left atrium. Mild to moderate aortic valve sclerosis is present. Urinalysis with mild UTI symptoms, 2+ leukocyte esterase, a few WBCs and trace bacteria noted.   BRIEF HOSPITAL COURSE: Mr. Niehoff is an 81 year old African-American male with history of prior multiple strokes with associated vascular dementia, hypertension, diabetes mellitus and end-stage renal disease, on hemodialysis, who presents to the hospital after seizures at home.   1. Recurrent seizures. No prior history of seizures. The patient came in with new-onset seizures and had recurrent seizures in the hospital. The patient was intubated for airway protection and easily extubated. He was seen by neurology and started on Keppra. Likely cause of seizures is encephalomalacia from previous strokes. Had a CT head which showed old infarction and encephalomalacia. MRI of the brain did not reveal any mass, but did confirm atrophy with chronic white matter disease and also previous infarcts. The patient remained stable on Keppra, without further seizures, and he was successfully extubated. He did have toxic metabolic encephalopathy immediately post extubation secondary to his seizures; however, he has underlying dementia too. At this time, the patient is awake, alert, oriented x2. He has short-term memory loss. He has minimal right arm weakness, but is  able to feed himself with assistance. He worked with physical therapy, who has recommended rehab for the patient. THE  PATIENT IS ALLERGIC TO ASPIRIN, so he was put on Plavix and statin for his strokes.  2. End-stage renal disease, on hemodialysis. He was dialyzed per schedule while in the hospital. 3. Hypertension. The patient is on metoprolol for his hypertension and well controlled. 4. Diabetes mellitus. He was on Levemir. Dose has been adjusted at this time prior to discharge.  5. His course has been otherwise uneventful in the hospital. Post extubation, he was assessed by speech pathologist, who recommended dysphagia 1 diet with nectar thick liquids at this time. This will need to be re-evaluated and followed up at the rehab place as his diet might be advanced at a later time.   DISCHARGE CONDITION: Stable.   DISCHARGE DISPOSITION: Short-term rehab.   TIME SPENT ON DISCHARGE: 45 minutes.   ____________________________ Gladstone Lighter, MD rk:lb D: 04/29/2013 13:26:04 ET T: 04/29/2013 14:18:52 ET JOB#: [id number]  cc: Gladstone Lighter, MD, <Dictator> Arlis Porta., MD Gladstone Lighter MD ELECTRONICALLY SIGNED 05/13/2013 13:33

## 2014-08-06 NOTE — H&P (Signed)
PATIENT NAME:  Michael Acosta, Michael Acosta MR#:  [id number] DATE OF BIRTH:  09-25-1929  DATE OF ADMISSION:  11/26/2013  REFERRING PHYSICIAN: Dr. Marjean Donna.   PRIMARY CARE PHYSICIAN: Dr. Brynda Greathouse.   CHIEF COMPLAINT: Fever and tachycardia.    HISTORY OF PRESENT ILLNESS: An 81 year old African American gentleman with history of end-stage renal disease on dialysis, hypertension, dementia, diabetes, insulin-requiring as well as seizure disorder, presenting with fever. The patient presents from [facility] with documented fever and tachycardia. He, himself, is unable to provide any meaningful information given mental status and medical condition. Nursing staff denies any further symptomatology to their knowledge. In our Emergency Department, he was noted to be febrile 100.3 degrees Fahrenheit, tachycardic 124 as well as tachypnea and respiratory rate 24. He was started on broad-spectrum antibiotics thus far in treatment for sepsis of unclear etiology.   REVIEW OF SYSTEMS: Unable to obtain given the patient's current mental status and medical condition.   PAST MEDICAL HISTORY: End-stage renal disease on hemodialysis, dementia, hypertension, diabetes insulin-requiring, seizure disorder, as well as CVA without residual deficits.   SOCIAL HISTORY: No documentation of alcohol, tobacco, or drug usage.   FAMILY HISTORY: Positive for hypertension.   ALLERGIES: ASPIRIN AS WELL AS PENICILLIN. PENICILLIN CAUSING, DIZZINESS.   HOME MEDICATIONS: Acetaminophen/hydrocodone 325/5 mg p.o. q. 6 hours as needed for pain, nitroglycerin 0.2 mg transdermal film extended release q. daily,  Keppra 500 mg p.o. q. daily, Levemir 12 units b.i.d., Novolin insulin sliding scale, promethazine 25 mg p.o. q. 6 hours, atorvastatin 20 mg p.o. at bedtime, metoprolol 25 mg p.o. b.i.d., Norvasc 5 mg p.o. daily, Colace 100 mg p.o. b.i.d. p.r.n. constipation, sucralfate 1 gram p.o. 4 times daily before meals and at bedtime,  Renvela 800 mg p.o. 3 times daily, probiotics twice daily, Prilosec 20 mg p.o. q. daily, hydralazine 50 mg p.o. q. 6 hours and vitamin C 500 mg p.o. b.i.d.   PHYSICAL EXAMINATION:  VITAL SIGNS: Temperature 100.3 degrees Fahrenheit, heart rate 124, respirations 24, blood pressure 110/62, saturating 99% on room air. Weight 68 kg, BMI 24.2.  GENERAL: Ill-appearing African American gentleman in minimal to moderate distress given mental status.  HEAD: Normocephalic, atraumatic.  EYES: Pupils equal, round, reactive to light.  Extraocular muscles are intact given mental status. No scleral icterus.  MOUTH: Dry mucosal membrane. Dentition intact. No abscess noted.  EARS, NOSE, AND THROAT: Clear without exudates. No external lesions.  NECK: Supple. No thyromegaly. No nodules. No JVD.  PULMONARY: Clear to auscultation bilaterally without wheezes, rubs or rhonchi. No accessory muscle use muscles. Good respiratory effort.  CHEST: Nontender to palpation.  CARDIOVASCULAR: S1, S2, tachycardic, no murmurs, rubs, or gallops. No edema. Pedal pulses 2+ bilaterally. GASTROINTESTINAL: Soft, nontender, nondistended. No masses. Positive bowel sounds. No hepatosplenomegaly.  MUSCULOSKELETAL: No swelling, clubbing, or edema. Passive range of motion full in all extremities.  NEUROLOGIC: Cranial nerves II through XII intact, but unable to fully assess given the patient's current mental status as he is somnolent, will arouse to painful stimuli and localize. Unable to provide any meaningful information or follow commands at this time.  SKIN: No ulceration, lesions, rash or cyanosis. AV fistula with palpable thrill in the left upper extremity.  PSYCHIATRIC: Unable to fully assess given the patient's current mental status and medical condition as once again he is somnolent, will arouse to painful stimuli; however, unable to follow any commands at this time.   LABORATORY DATA: Chest x-ray performed: Low lung volumes, however, no  acute  cardiopulmonary process. Remainder of laboratory data: Sodium 140, potassium 4.8, chloride 97, bicarbonate 26, BUN 70, creatinine 9.7, glucose 160. LFTs: Albumin 2.8, alkaline phosphatase 123, otherwise within normal limits. Troponin 0.44. WBC 16.5, hemoglobin 11, platelets of 213,000.  INR 1.1.  ABG performed, 7.48/37/44/27 with lactic acid of 2.3.   ASSESSMENT AND PLAN: An 81 year old African American gentleman with history of end-stage renal disease on dialysis, as well as dementia, presenting with fever and tachycardia from his nursing facility. He is unable to provide any meaningful information given mental status.   1. Sepsis: Meeting septic criteria by heart rate, respiratory rate and the leukocytosis present on admission of unclear etiology. Panculture including blood. Antibiotic coverage with vancomycin and Zosyn. Follow culture data, adjust antibiotics accordingly.  Intravenous fluid hydration to keep mean arterial pressure greater than 65. However, we will use more gentle hydration than usual given end-stage renal disease. He is oxygenating fine so we will continue fluids as required.  2. Hypertension. He is actually relatively hypotensive at this time. We will hold all medications.  3. Seizure disorder. Continue with Keppra.  4. Diabetes. Add insulin sliding scale with q. 6 hour Accu-Checks.  Give him Levemir.  5. Elevated troponin. Trend cardiac enzymes x 3. Place on telemetry.  6. End-stage renal disease on dialysis. We will consult nephrology.  7. Venous thromboembolism prophylaxis with heparin subcutaneously.  CODE STATUS: Patient is a full code.   TIME SPENT: 45 minutes.    ____________________________ [name], MD dkh:JT D: 11/26/2013 03:54:07 ET T: 11/26/2013 04:42:32 ET JOB#: [id number]  cc: [name], MD, <Dictator> DAVID Woodfin Ganja MD ELECTRONICALLY SIGNED 11/26/2013 23:06

## 2014-08-06 NOTE — H&P (Signed)
PATIENT NAME:  Michael Acosta, Michael Acosta MR#:  [id number] DATE OF BIRTH:  Jun 23, 1929  DATE OF ADMISSION:  05/01/2013  REFERRING PHYSICIAN:  Dr. Conni Slipper.   PRIMARY CARE PHYSICIAN:  Dr. Larene Beach.   CHIEF COMPLAINT:  Bright red blood per rectum.   HISTORY OF PRESENT ILLNESS:  This is an 81 year old male who was discharged yesterday from [facility] after a hospital stay for new onset seizures, acute respiratory failure, required intubation, the patient was successfully extubated, has a history of multiple CVAs in the past, the patient was discharged to a skilled nursing facility, he is known to be end-stage renal disease, the patient on hemodialysis Monday, Wednesday, Friday, at the nursing home the patient was noticed to have bright red blood per rectum so the patient brought by EMS to the hospital, the patient is elderly poor historian, he denies any coffee-ground emesis.  Denies any abdominal pain, any nausea, any vomiting, any diarrhea, any constipation, any fever or chills.  The patient was noticed to have bright red blood per rectum.  No melena, no coffee-ground emesis, the patient's hemoglobin was found to be at 9.3, dropped from most recent which was 10.3, the patient had recent flexible sigmoidoscopy done by Dr. Gustavo Lah on December 5th of last year which did show diverticulosis of the sigmoid colon and in the distal sigmoid colon and multiple nonbleeding colonic angiectasis, the patient is on Plavix for his CVA.  Hospitalist service were requested to admit the patient for further management and work-up of his bright red blood per rectum.   PAST MEDICAL HISTORY: 1.  End-stage renal disease on hemodialysis Monday, Wednesday, Friday.  2.  Diabetes mellitus.  3.  Hypertension.  4.  CVA x 3.  5.  Prostate cancer.  6.  Colon polyps.   PAST SURGICAL HISTORY: 1.  Partial colon resection for polyps.  2.  AV fistula for dialysis access.   ALLERGIES:  ASPIRIN, BC POWDER AND PENICILLIN.    FAMILY HISTORY:  Significant for hypertension.   SOCIAL HISTORY:  The patient usually lives at home and was recently discharged to skilled nursing facility.  No history of smoking, alcohol or illicit drug use.   HOME MEDICATIONS: 1.  Keppra 500 mg oral daily.  2.  Keppra 100 mg on Monday, Wednesday, Friday after dialysis.  3.  Insulin Detemir 12 units 2 times a day.  4.  Simvastatin 40 mg oral at bedtime.  5.  Plavix 75 mg oral daily.  6.  Metoprolol 25 mg oral 2 times a day.  7.  Calcium acetate 667 mg oral capsule 3 times a day.   8.  Omeprazole 20 mg oral daily.   REVIEW OF SYSTEMS: CONSTITUTIONAL:  The patient denies fever, chills, fatigue, weakness, weight gain, weight loss.  EYES:  Denies blurry vision, double vision, inflammation, glaucoma.  EARS, NOSE, THROAT:  Denies tinnitus, ear pain, hearing loss, epistaxis or discharge.  RESPIRATORY:  Denies cough, wheezing, hemoptysis, painful respiratory, COPD.  CARDIOVASCULAR:  Denies chest pain, edema, arrhythmia, palpitations, syncope.  GASTROINTESTINAL:  Denies nausea, vomiting, diarrhea, abdominal pain, hematemesis, jaundice, the patient was noticed to have bright red blood per rectum, but he is unaware of that.  GENITOURINARY:  The patient reports he is anuric, does not make any urine.  ENDOCRINE:  Denies polyuria, polydipsia, heat or cold intolerance.  HEMATOLOGY:  Reports history of anemia.  Denies easy bruising.  INTEGUMENTARY:  Denies acne, rash or skin lesion.  MUSCULOSKELETAL:  Denies any neck pain, shoulder pain,  arthritis, gout.  Reports he ambulates with a cane.   NEUROLOGIC:  The patient is known to have history of CVA in the past, recent diagnosis of seizures.  Has history of mild dementia.  Denies any vertigo or headaches.  PSYCHIATRIC:  Denies anxiety, insomnia or depression.   PHYSICAL EXAMINATION: VITAL SIGNS:  Temperature 99.1, pulse 90, respiratory rate 16, blood pressure 168/65, saturating 96% on room air.   GENERAL:  Elderly male, looks comfortable in bed, in no apparent distress.  HEENT:  Head atraumatic, normocephalic.  Pupils equal, reactive to light.  Pink conjunctivae.  Anicteric sclerae.  Moist oral mucosa.  NECK:  Supple.  No thyromegaly.  No JVD.  CHEST:  Good air entry bilaterally.  No wheezing, rales or rhonchi.  CARDIOVASCULAR:  S1, S2 heard.  No rubs, murmur, gallops.  ABDOMEN:  Soft, nontender, nondistended.  Bowel sounds present.  EXTREMITIES:  No edema.  No clubbing.  No cyanosis.  The patient wearing bilateral protective boot in his lower extremity having left heel ulcer, unstageable.  Pedal pulses slightly diminish, but felt bilaterally.  MUSCULOSKELETAL:  The patient appears to be having surgical scar at the left lower extremity with muscle wasting and no joint effusion or erythema.  The patient is having AV fistula bilaterally, but the one in the left arm having bruits.  PSYCHIATRIC:  The patient has appropriate affect.  Awake, alert x 2, mildly confused.   MOTOR:  The patient appears to be moving all extremities without significant deficits.  Cranial nerves grossly intact.   PERTINENT LABORATORY DATA:  Glucose 269, BUN 31, creatinine 5.97, sodium 135, potassium 3.9, chloride 98, CO2 33, ALT 18, AST 37, alk phos 76, total bilirubin 0.2, albumin 2.7, total protein 6.5.  White blood cells 7.5, hemoglobin 9.3, hematocrit 29, platelets 175.   ASSESSMENT AND PLAN: 1.  Bright red blood per rectum, this is most likely due to lower gastrointestinal bleed from his known diverticulosis and angiectasis, we will hold the patient's Plavix, we will avoid chemical anticoagulation.  We will have him on IV Protonix 40 twice daily.  We will keep him on clear liquid diet.  We will monitor his hemoglobin every eight hours.  We will consult gastroenterology.  At this point, there is no indication of transfusion.  Vital signs appear to be stable.  2.  End-stage renal disease.  We will consult nephrology  to continue hemodialysis on Monday, Wednesday, Friday, we will continue the patient on calcium acetate.  3.  Hypertension.  Blood pressure elevated.  We will resume him back on metoprolol.  4.  Hyperlipidemia.  Continue with statin.  5.  History of seizures.  Continue with Keppra.  6.  History of cerebrovascular accident in the past, currently we will hold Plavix due to his lower gastrointestinal bleed.  7.  Deep vein thrombosis prophylaxis.  Sequential compression device 8.  Left heel pressure ulcer.  We will keep the patient on ulcer protective boots.  9.  CODE STATUS.  AS PER PREVIOUS ADMISSION THE PATIENT WAS FULL CODE.  WE WILL KEEP HIM FULL CODE.   Total time spent on admission and patient care 55 minutes.    ____________________________ Albertine Patricia, MD dse:ea D: 05/01/2013 06:19:16 ET T: 05/01/2013 06:46:38 ET JOB#: [id number]  cc: Albertine Patricia, MD, <Dictator> Khian Remo Graciela Husbands MD ELECTRONICALLY SIGNED 05/02/2013 5:19

## 2014-08-06 NOTE — Discharge Summary (Signed)
Dates of Admission and Diagnosis:  Date of Admission 04-Jul-2013   Date of Discharge 09-Jul-2013   Admitting Diagnosis rectal bleeding   Final Diagnosis rectal bleeding   Discharge Diagnosis 1 ESRD on HD   2 Diabetic Heel Ulcer s/p angioplasty   3 DM   4 History of Prostate Cancer    Chief Complaint/History of Present Illness HISTORY OF PRESENT ILLNESS: Michael Acosta is an 81 year old male who recently had admission in February 2015 for rectal bleed, which was thought to be from radiation proctitis. The patient was seen by GI at that time, he had flexible sigmoidoscopy which confirmed  radiation proctitis. The patient has history of end-stage renal disease on hemodialysis. The patient states noted to have bright red blood, a large amount in the commode. Concerning this, called EMS and was brought to the Emergency Department. Work-up in the Emergency Department, the patient's hemoglobin is stable at 10.3. The patient had one bowel movement in the Emergency Department and did not show any blood in there. Denies having any abdominal pain.   Allergies:  PCN: Dizzy/Fainting  Aspirin: Bleeding  BC: Unknown    Routine Chem:  27-Mar-15 05:19   Glucose, Serum  60  BUN  31  Creatinine (comp)  8.32  Sodium, Serum 137  Potassium, Serum 4.8  Chloride, Serum 98  CO2, Serum 30  Calcium (Total), Serum 9.0  Anion Gap 9  Osmolality (calc) 278  eGFR (African American)  6  eGFR (Non-African American)  5 (eGFR values <31m/min/1.73 m2 may be an indication of chronic kidney disease (CKD). Calculated eGFR is useful in patients with stable renal function. The eGFR calculation will not be reliable in acutely ill patients when serum creatinine is changing rapidly. It is not useful in  patients on dialysis. The eGFR calculation may not be applicable to patients at the low and high extremes of body sizes, pregnant women, and vegetarians.)  Routine Hem:  27-Mar-15 05:19   WBC (CBC) 5.6  RBC (CBC)   3.64  Hemoglobin (CBC)  10.7  Hematocrit (CBC)  33.2  Platelet Count (CBC) 189  MCV 91  MCH 29.3  MCHC 32.1  RDW  16.5  Neutrophil % 73.7  Lymphocyte % 11.1  Monocyte % 11.7  Eosinophil % 2.8  Basophil % 0.7  Neutrophil # 4.2  Lymphocyte #  0.6  Monocyte # 0.7  Eosinophil # 0.2  Basophil # 0.0 (Result(s) reported on 09 Jul 2013 at 05:43AM.)   Pertinent Past History:  Pertinent Past History PAST MEDICAL HISTORY: 1. End-stage renal disease on hemodialysis Monday, Wednesday, Friday schedule. 2. Diabetes mellitus.  3. Hypertension.  4. History of CVA x 3.  5. Recurrent seizures.  6. Prostate cancer.  7. Colon polyps status post hemicolectomy.  8. Dementia.   Hospital Course:  Michael Acosta a 81year old male who comes to the Emergency Department with bright red blood per rectum.  1.Recurrent lower Rectal bleed: He has a historyof telangatasia on previous sigmoidoscopy from raditation 04/2013 - his hgb is stable -c/s shows  Medium sized angioectasia in the distal rectum treated with argon fulgaration.  Also suspect diverticulum in hepatic flexure region. As per GI clear liquids for 2 days, then full liquids for 2 days, then low residue for a week.   He will need follow up GI in 2 months 2. End-stage renal disease on hemodialysis Monday, Wednesday, Friday schedule. He received HD while in the hospital.  3. Hypertension: He was continued on his home medications  4. Diabetes mellitus: He was continued on Levemir.  5. Diabeteic left foot ulcer: Regarding this Podiatry and Vascular were consulted.He underwent Abdominal Aortogram w/Run-Off; Left Lower Extremity withPTA to 4 mm left popiteal and proximal bypass andPTA to 2-3 mm left peroneal on 07/09/13. He will undergo debdridement on 3/28 then will be able to go to [facility] for further care. His XRAY did not show evidence of osteomyelitis. He will continue on clinda and zosyn.   Condition on Discharge Stable    DISCHARGE INSTRUCTIONS HOME MEDS:  Medication Reconciliation: Patient's Home Medications at Discharge:     Medication Instructions  acetaminophen 325 mg oral tablet  2 tabs (663m) orally every 4 hours as needed for pain/temp. greater than 100.4   amlodipine 5 mg oral tablet  1 tab(s) orally once a day   atorvastatin 20 mg oral tablet  1 tab(s) orally once a day   levemir flextouch 100 units/ml subcutaneous solution  12 unit(s) subcutaneous 2 times a day   metoprolol tartrate 25 mg oral tablet  1 tab(s) orally 2 times a day for htn   omeprazole 20 mg oral delayed release tablet  1 tab(s) orally 2 times a day   novolog flexpen 100 units/ml subcutaneous solution  inject per sliding scale subcutaneous 4 times a day (before meals and at bedtime). :if 0-200=0 units, 201-250=4 units, 251-300=6 units, 301-350=8 units, 351-400=10 units, >400=Call MD   sucralfate 1 g oral tablet  1 tab(s) orally 4 times a day for gerd/retention for bowels   levetiracetam 500 mg oral tablet  1 tab(s) orally once a day   levetiracetam 250 mg oral tablet  1 tab(s) orally    clindamycin  300 milligram(s)  every 8 hours   calcium acetate 667 mg oral capsule  3 cap(s) orally 3 times a day (with meals)   piperacillin-tazobactam  3.375 gram(s)  every 12 hours   hydralazine  20 milligram(s) injectable every 6 hours, As needed, hypertension    STOP TAKING THE FOLLOWING MEDICATION(S):    docusate sodium sodium 100 mg oral tablet: 1 tab(s) orally 2 times a day, As Needed -constipation  Physician's Instructions:  Treatments None   Home Oxygen? No   Diet Carbohydrate Controlled (ADA) Diet  Renal Diet   Diet Consistency Regular Consistency   Activity Limitations As tolerated   [facility]  Return to Work Not Applicable   Time frame for Follow Up Appointment 1-2 days  vascular surgery and odiatry   Other Comments [facility]  (Signed 27-Mar-15 20:52)  Authored: ADMISSION DATE AND DIAGNOSIS, CHIEF COMPLAINT/HPI, Allergies, PERTINENT LABS, PERTINENT PAST HISTORY, HOSPITAL COURSE, DISCHARGE INSTRUCTIONS HOME MEDS, PATIENT INSTRUCTIONS   Last Updated: 27-Mar-15 20:52 by MBettey Costa(MD)

## 2014-08-06 NOTE — H&P (Signed)
PATIENT NAME:  Michael Acosta, Michael Acosta MR#:  [id number] DATE OF BIRTH:  1930/01/25  DATE OF ADMISSION:  08/07/2013  REFERRING PHYSICIAN:  Dr. Jasmine December.   PRIMARY CARE PHYSICIAN: Dr. Brynda Greathouse.   CHIEF COMPLAINT: Fall.   HISTORY OF PRESENT ILLNESS:  This is an 81 year old African-American gentleman with past medical history of end-stage renal disease dialysis dependent, diabetes insulin-requiring, hypertension, history of CVA, as well as mild dementia who is presenting after a fall. This was a mechanical fall which occurred at the nursing facility approximately two days ago. He states that he usually uses a walker for ambulation; however, he was attempting to go to the bathroom and simply fell in his room. He has pain in his left hip. He describes it only as pain. When active, intensity is 6 to 7/10, and currently it is 0. Pain is only with movement. He currently has no pain. During workup, he was noted to have a left trochanteric hip fracture. He has no further complaints.    REVIEW OF SYSTEMS:  CONSTITUTIONAL: Denies fever, fatigue, weakness.  EYES: Denies blurry vision, double vision or eye pain.  HEENT: Denies tinnitus, ear pain, hearing loss.  RESPIRATORY: Denies cough, wheeze, shortness of breath.  CARDIOVASCULAR: Denies chest pain, palpitations, edema.  GASTROINTESTINAL: Denies nausea, vomiting, diarrhea or abdominal pain.  GENITOURINARY: Denies dysuria, hematuria.  ENDOCRINE: Denies nocturia or thyroid problems.  HEMATOLOGIC/LYMPHATIC:  Denies easy bruising, bleeding.  SKIN: Denies rash or lesion.  MUSCULOSKELETAL: Denies pain in neck, back, shoulders, knees, hips or arthritic symptoms.  NEUROLOGIC: Denies paralysis, paresthesias.  PSYCHIATRIC: Denies anxiety or depressive symptoms.  Otherwise, full review of systems performed by me  is negative.   PAST MEDICAL HISTORY: End-stage renal disease on hemodialysis Monday, Wednesday, Friday for approximately five year duration, access fistula in the  left upper extremity, insulin- requiring diabetes, hypertension, history of CVA without residual deficits, as well as mild dementia.   SOCIAL HISTORY: No alcohol, tobacco or drug usage. Currently resides at nursing facility.   FAMILY HISTORY: Positive for hypertension.   ALLERGIES: ASPIRIN, AS WELL AS PENICILLIN.   HOME MEDICATIONS: Include oxycodone 5 mg p.o. b.i.d. as needed for pain, levetiracetam  500 mg p.o. daily, Levemir 12 units b.i.d., insulin sliding scale with meals, atorvastatin 20 mg p.o. at bedtime, metoprolol 25 mg p.o. b.i.d.,  Norvasc 10 mg p.o. daily, zinc sulfate 220 mg p.o. daily, sucralfate 1 gram p.o. 4 times daily, PhosLo 667 mg 3 capsules 3 times daily with meals, probiotic, RisaQuad 1 capsule b.i.d., pantoprazole 40 mg p.o. b.i.d., hydralazine 50 mg p.o. q.6 hours, vitamin C 500 mg b.i.d.   PHYSICAL EXAMINATION: VITAL SIGNS: Temperature 98.2, heart rate 77, respirations 20, blood pressure 165/69, saturating 97% on room air. Weight 81.7 kg, BMI 30.  GENERAL: Well-nourished, well-developed African-American gentleman, currently in no acute distress.  HEAD: Normocephalic, atraumatic.  EYES: Pupils equal, round, react to light. Extraocular muscles intact.  No scleral icterus.  MOUTH: Moist mucous membranes. Dentition intact. No abscess noted.  EARS, NOSE, THROAT: Clear, without exudates. No external lesions. NECK: Supple. No thyromegaly. No nodules. No JVD.  PULMONARY: Clear to auscultation bilaterally, without wheezes, rales or rhonchi. No use of accessory muscles. Good respiratory effort.   CHEST: Nontender to palpation. CARDIOVASCULAR: S1, S2, regular rate and rhythm. No murmurs, rubs or gallops. No edema. Pedal pulse 2+ bilaterally.  GASTROINTESTINAL: Soft, nontender, nondistended. No masses. Positive bowel sounds. No hepatosplenomegaly.  MUSCULOSKELETAL: No swelling, clubbing, edema. Range of motion full in all extremities. AV  fistula with good palpable thrill of left  upper extremity.   MUSCULOSKELETAL: Range of motion limited in the left lower extremity, secondary to fracture that is shortened and externally rotated.  NEUROLOGIC: Cranial nerves II through XII intact. No gross focal neurological deficits. Sensation intact. Reflexes intact.  SKIN: No ulcerations, lesions, rash, cyanosis. Skin warm, dry. Turgor intact.  PSYCHIATRIC: Mood and affect within normal limits. The patient alert and oriented x 3. Insight and judgment intact.   LABORATORY DATA: Hip x-ray performed revealing intertrochanteric fracture of the left femur without dislocation. Chest x-ray performed; asymmetric opacity of right upper lobe and right lower lobe, exaggerated by rotation.   Remainder of laboratory data: Sodium 135, potassium 4.5, chloride 98, bicarb 29, BUN 46, creatinine 7.27, glucose 157. LFTs within normal limits, aside from albumin of 3. WBC 8.6, hemoglobin 10.4, platelets 154.   EKG performed revealing normal sinus rhythm with findings suggestive of left ventricular hypertrophy.   ASSESSMENT AND PLAN: An 81 year old Caucasian male with history end-stage renal disease on dialysis, diabetes, hypertension, as well as a history of cerebrovascular accident without residual deficits, presenting after a mechanical fall.  1.  Preoperative evaluation for left trochanteric hip fracture. The patient should be considered moderate risk given a creatinine level greater than 2, history of cerebrovascular accident, as well as diabetes insulin-requiring, however, he has no active cardiac issues including anginal symptoms, valvular dysfunctions, arrhythmias or congestive heart failure symptoms. His METs less than 4, which is limited by his mobility. He should be deemed a moderate risk for surgery from a cardiac standpoint. No further testing or intervention is required prior to surgery. As far as medications, continue all medications, aside from insulin, which will hold his insulin sliding scale  and decrease his Levemir in half while n.p.o. status. Continue with pain medications p.r.n. and add bowel regimen. We will defer deep venous thromboembolism prophylaxis to orthopedics.  2.  Diabetes. Once again, half the dose of Levemir would be 6 unit b.i.d. and hold insulin sliding scale until taking p.o.  3.  Hypertension. Continue with the beta blockade, calcium channel blockers and hydralazine.  4. End-stage renal disease, on hemodialysis. We will consult nephrology for continuation of dialysis Monday, Wednesday and Friday.  5.  Deep venous thrombosis prophylaxis. Will defer to orthopedics. Currently, sequential compression devices.  6.  CODE STATUS: The patient is FULL CODE.    TIME SPENT: 45 minutes.    ____________________________ Aaron Mose. Joevanni Roddey, MD dkh:aj D: 08/07/2013 35:59:74 ET T: 08/08/2013 01:43:26 ET JOB#: [id number]  cc: Aaron Mose. Janautica Netzley, MD, <Dictator> Mikeal Hawthorne. Brynda Greathouse, MD Emlyn Maves Woodfin Ganja MD ELECTRONICALLY SIGNED 08/08/2013 4:16

## 2014-08-06 NOTE — Op Note (Signed)
PATIENT NAME:  Michael Acosta, CANNER MR#:  [id number] DATE OF BIRTH:  09-25-1929  DATE OF PROCEDURE:  07/10/2013  PREOPERATIVE DIAGNOSIS: Decubitus diabetic ulcer, left heel.   POSTOPERATIVE DIAGNOSIS: Decubitus diabetic ulcer, left heel.   PROCEDURES: Excision and debridement of diabetic heel ulcer, left heel utilizing sharp dissection and wound VAC.   SURGEON: Albertine Patricia, DPM   ASSISTANT: None.   HISTORY OF PRESENT ILLNESS: The patient has had wound on his left heel for a couple of months now, had black eschar to the region which has stabilized. There is no surrounding cellulitis or redness at this juncture. The patient does have pain with it. It needed to be debrided in the operating room.   ANESTHESIA: General with local.   ANESTHESIOLOGIST: Gunnar Bulla, MD  ESTIMATED BLOOD LOSS: 15 mL.   HEMOSTASIS: None.   OPERATIVE REPORT: The patient was brought to the OR and placed on the OR table in the supine position. At this point after general anesthesia was achieved, the patient was then blocked by me around the heel area with 10 mL of Marcaine plain. At this time, the left heel wound was debrided with a 15 blade. The black eschar was excised from the area. Significant hyperkeratotic rim was debrided and excised as well with a 15 blade. Several areas of necrosis were noted centrally. This was debrided with a VersaJet set at the level 5. Good bleeding occurred in the region and did not appear to have any deep penetration, sinus tracking or undermining to the region. The wound measured at 4 cm by 3.1 cm in width with 5 mm of depth. After the debridement was achieved and the clean tissues were noted, a silver GranuFoam wound VAC application was placed across the area and set at 125 mmHg continuous pressure. He will need to continue this as he goes to rehab and follow up with a wound care center either in here in [place] or [place]. I think he has been transferred to [facility]. They can set  something up at [facility].    ____________________________ Gerrit Heck. Namya Voges, DPM mgt:lt D: 07/10/2013 11:24:54 ET T: 07/10/2013 22:11:37 ET JOB#: [id number]  cc: Gerrit Heck Alayla Dethlefs, DPM, <Dictator> Perry Mount MD ELECTRONICALLY SIGNED 08/10/2013 12:50

## 2014-08-06 NOTE — Consult Note (Signed)
PATIENT NAME:  Michael Acosta, Michael Acosta MR#:  [id number] DATE OF BIRTH:  1929/06/07  DATE OF CONSULTATION:  07/05/2013  REFERRING PHYSICIAN:  Sona A. Posey Pronto, MD CONSULTING PHYSICIAN:  Lollie Sails, MD/Jaycee Pelzer A. Jerelene Redden, ANP (Adult Nurse Practitioner)  REASON FOR CONSULTATION: Recurrent rectal bleeding.   HISTORY OF PRESENT ILLNESS: This pleasant 81 year old male had recent admission in February 2015 for rectal bleed thought to be from radiation proctitis as confirmed by flexible sigmoidoscopy. He was advised use sucralfate enemas for treatment, with 1 gram of Carafate dissolved in 1 tablespoon water to be placed as an enema twice daily. The patient resides at [facility]. He does present on oral Carafate q.i.d. but not certain that he has received the rectal enema for his proctitis.   The patient was reported to have bright red blood per rectum, a large amount in the commode, and was brought to the Emergency Room. He had a hemoglobin of 10.3. He had 1 bowel movement in the Emergency Department that did not show any blood. The patient reports he had a brown bowel movement this morning, soft. He denies any abdominal pain, and states he has had bleeding from time to time over last 3 years. He says he feels pretty well; the only thing that is bothering him today is that his feet hurt. He is now in dialysis receiving his routine dialysis. GI has been asked to see him for further evaluation and management.   Dr. Thurmond Butts saw the patient in consultation 05/01/2013 and performed a flexible sigmoidoscopy that showed erythematous mucosa with oozing in the distal rectum. It showed diverticulosis along with multiple angiectasias in the distal colon that were not treated. The preparation of the colon was poor. The etiology of the bleeding was thought to be radiation proctitis. The patient was felt to be somewhat high risk for a full colonoscopy and the plan was to treat him locally and if the bleeding continued, then he  would require a full bowel prep and a full colonoscopy. According to the chart review, he has had a prior flexible sigmoidoscopy that showed radiation proctitis as well. It is uncertain when patient had his last full colonoscopy and he is unable to tell me this today. His wife is not available for further detailed history.   PAST MEDICAL HISTORY: 1. End-stage renal disease on hemodialysis Monday, Wednesday, Friday schedule.  2. Diabetes mellitus.  3. Hypertension.  4. History of CVA x3.  5. Recurrent seizures.  6. History of prostate cancer.  7. History of colon polyps status post hemicolectomy.  8. Dementia.   PAST SURGICAL HISTORY: 1. Partial colon resection for polyps.  2. AV fistula for dialysis access. The patient has a left forearm fistula that is working. He has a right forearm fistula is not working.   MEDICATIONS ON ADMISSION: Reported: 1. Sucralfate 1 gram 4 times daily.  2. Omeprazole 20 mg twice daily.  3. NovoLog FlexPen per sliding scale insulin.  4. Metoprolol 25 mg twice daily.  5. Keppra 500 mg once a day and also Monday, Wednesday, Friday.  6. Senna 100 mg 2 tablets daily.  7. Atorvastatin 20 mg daily.  8. Amlodipine 5 mg daily.  9. Acetaminophen 650 mg as needed.   ALLERGIES:  1. ASPIRIN.  2. PENICILLIN. 3. BC POWDER.   REVIEW OF SYSTEMS: The patient presents alone and is a fair historian. He has a history of dementia. He reports last night passing at large amount of bright red blood. He denies hard  stools, rectal pain, itching or burning. He denies abdominal pain, nausea or vomiting. He reports he has had normal appetite, diet and weight. He does feel fatigued.   PHYSICAL EXAMINATION: VITAL SIGNS: 97.9, 64, 20, 151/53, pulse oximetry room air is 95%.  GENERAL: Elderly African-American male interviewed undergoing dialysis. HEENT: Head is normocephalic. Conjunctivae pink. Sclerae is anicteric.  NECK: Supple. Trachea is midline.  HEART: Heart tones S1, S2,  without murmur, rub or gallop.  LUNGS: Clear to auscultation. Respirations are eupneic and this is posterior. He remains clear.  ABDOMEN: Soft. Bowel sounds are present. No tenderness anywhere in the abdomen.  RECTAL: Deferred.  EXTREMITIES: Lower extremities without edema, cyanosis, or clubbing. Foot ulcer is covered with dressing.  PSYCHIATRIC: The patient is pleasant, maintains eye contact, cooperative.  NEUROLOGIC: Grossly intact. History of dementia noted. Memory deficit is noted.   LABORATORY: Admission hemoglobin 10.3, today 9.6. The discharge hemoglobin   low 8- 9 range, consistent with stable dialysis patient.  The rest of the labs unremarkable.   IMPRESSION: Patient presents with bright red blood per rectum and stable hemoglobin  per chart review. He had a recent flexible sigmoidoscopy January 2015 which showed  radiation proctitis. He had a colonoscopy prior that showed diverticulosis, multiple angiectasias distal colon. The patient has a complex medical history and he has end-stage renal disease. Patient with complex medical history presents with bright red rectal bleeding. This has been a recurrent problem. Etiology has been thought to be from radiation proctitis. The patient is hemodynamically stable, and hemoglobin is at baseline. He does have a history of partial colon resection from polyps . Other etiology is being considered for rectal bleeding is to consider for colorectal  neoplasm, doubt diverticulosis.   PLAN:  1. Recommend a full colonoscopy with bowel prep when clinically feasible. Dr. Gustavo Lah is to consider timing of this study.  2.  Continue to monitor for worseing anemia. Patient is currently stable in no acute distress and further GI recommendations pending.   Thank you for the consultation.   These services provided by Joelene Millin A. Jerelene Redden, MS, APRN, BC, ANP (Adult Nurse Practitioner) under collaborative agreement with Lollie Sails,  MD.    ____________________________ Janalyn Harder Jerelene Redden, ANP (Adult Nurse Practitioner) kam:lm D: 07/05/2013 21:21:55 ET T: 07/05/2013 22:35:22 ET JOB#: [id number]  cc: Joelene Millin A. Jerelene Redden, ANP (Adult Nurse Practitioner), <Dictator> Janalyn Harder Sherlyn Hay, MSN, ANP-BC Adult Nurse Practitioner ELECTRONICALLY SIGNED 07/06/2013 12:02

## 2014-08-06 NOTE — Consult Note (Signed)
Brief Consult Note: Diagnosis: Left intertrochanteric hip fracture.   Patient was seen by consultant.   Recommend to proceed with surgery or procedure.   Recommend further assessment or treatment.   Orders entered.   Discussed with Attending MD.   Comments: 81 year old male fell a couple days ago injuring the left hip.. Brought to Emergency Room yesterday and exam and X-rays show a displaced left intertrochanteric hip fracture. He has renal failure and is on dialysis and has Diabetes and hypertension.  Lives at skilled nursing facility [facility].  Cleared for surgery by medical service. Spoke with Dr Juleen China who thought surgery would be tomrrow but agrees with surgery today if anesthesia agrees. Due for dialysis tomorrow. Spoke with patient and wife and explained options. They agree to surgery today.  Risks and benefits of surgery were discussed at length including but not limited to infection, non union, nerve or blood vessed damage, non union, need for repeat surgery, blood clots and lung emboli, and death.. hemoglobin level 10.1  May need transfusion later.  Has ulcer left heel and this has been dressed by wound center nurse.   Exam:  Alert and oriented.  Left leg short and rotated.  circulation/sensation/motor function good.  skin intact.  No other complaints of pain.  Right hip and leg normal.   X-rays: as above  Imp: as above  Plan:  open reduction and internal fixation left hip today if approved..  Electronic Signatures: Park Breed (MD)  (Signed 26-Apr-15 11:33)  Authored: Brief Consult Note   Last Updated: 26-Apr-15 11:33 by Park Breed (MD)

## 2014-08-06 NOTE — Consult Note (Signed)
Chief Complaint:  Subjective/Chief Complaint seen for rectal bleeding.  tolerated prep for colonoscopy.  mild nausea currently. denies abd pain or recurrent bleeding since admission.   VITAL SIGNS/ANCILLARY NOTES: **Vital Signs.:   26-Mar-15 10:15  Temperature Temperature (F) 98.4  Celsius 36.8  Temperature Source oral  Pulse Pulse 77  Respirations Respirations 18  Systolic BP Systolic BP 956  Diastolic BP (mmHg) Diastolic BP (mmHg) 76  Mean BP 110  Pulse Ox % Pulse Ox % 93  Pulse Ox Activity Level  At rest  Oxygen Delivery Room Air/ 21 %    13:05  Pulse Pulse 77  Systolic BP Systolic BP 213  Diastolic BP (mmHg) Diastolic BP (mmHg) 76  Mean BP 108  Pulse Ox % Pulse Ox % 94  Pulse Ox Activity Level  At rest  Oxygen Delivery Room Air/ 21 %   Brief Assessment:  GEN well developed   Cardiac Regular   Respiratory clear BS   Gastrointestinal details normal Soft  Nontender  Nondistended  No masses palpable  Bowel sounds normal   Lab Results:  Routine Chem:  26-Mar-15 06:05   Result Comment HGB - SPECIMEN CLOTTED-NOTIFIED CESIELY  - FERGUSON @0620  07-08-13 FOR RECOLLECT  - AJO  Result(s) reported on 08 Jul 2013 at [facility].    06:29   Result Comment HGB - SPECIMEN CLOTTED  Result(s) reported on 08 Jul 2013 at 06:45AM.  Routine Coag:  26-Mar-15 12:52   Prothrombin 13.4  INR 1.0 (INR reference interval applies to patients on anticoagulant therapy. A single INR therapeutic range for coumarins is not optimal for all indications; however, the suggested range for most indications is 2.0 - 3.0. Exceptions to the INR Reference Range may include: Prosthetic heart valves, acute myocardial infarction, prevention of myocardial infarction, and combinations of aspirin and anticoagulant. The need for a higher or lower target INR must be assessed individually. Reference: The Pharmacology and Management of the Vitamin K  antagonists: the seventh ACCP Conference on Antithrombotic  and Thrombolytic Therapy. YQMVH.8469 Sept:126 (3suppl): [id number]. A HCT value >55% may artifactually increase the PT.  In one study,  the increase was an average of 25%. Reference:  "Effect on Routine and Special Coagulation Testing Values of Citrate Anticoagulant Adjustment in Patients with High HCT Values." American Journal of Clinical Pathology 2006;126:400-405.)  Routine Hem:  26-Mar-15 06:05   Hemoglobin (CBC) -    06:29   Hemoglobin (CBC) -    07:07   Hemoglobin (CBC)  10.0 (Result(s) reported on 08 Jul 2013 at 07:34AM.)   Assessment/Plan:  Assessment/Plan:  Assessment 1) recueeent rectal bleeding-h/o radiation proctitis.  2) Multiple medical problems   Plan 1) for colonoscopy today- I have discussed the risks benefits and complications of colonoscopy to include not limited to bleeding infection perforation and sedation with patient and his wife and they wish to proceed. Further recs to follow.   Electronic Signatures: Loistine Simas (MD)  (Signed 26-Mar-15 13:49)  Authored: Chief Complaint, VITAL SIGNS/ANCILLARY NOTES, Brief Assessment, Lab Results, Assessment/Plan   Last Updated: 26-Mar-15 13:49 by Loistine Simas (MD)

## 2014-08-06 NOTE — Discharge Summary (Signed)
PATIENT NAME:  Michael Acosta, Michael Acosta MR#:  [id number] DATE OF BIRTH:  02/18/1930  DATE OF ADMISSION:  08/07/2013 DATE OF DISCHARGE:  08/10/2013  PRIMARY CARE PHYSICIAN: Dr. Brynda Greathouse.   CONSULTATIONS: Dr. Sabra Heck, orthopedic surgeon.   DISCHARGE DIAGNOSES:  1.  Left hip fracture.  2.  Proximal atrial fibrillation.  3.  Hypertension.  4.  Diabetes.  5.  End-stage renal disease.   CONDITION: Stable.   CODE STATUS: Full code.   PROCEDURE: Left hip fracture surgery.   HOME MEDICATIONS: Please refer to the medication reconciliation list.   DIET: Low-sodium, low-fat, low-cholesterol, ADA renal diet.   ACTIVITY: As tolerated.   FOLLOWUP CARE: Follow up PCP within 1 to 2 weeks. Follow up with Dr. Sabra Heck within 1 to 2 weeks. Follow up with Dr. Humphrey Rolls, cardiologist, within 1 to 2 weeks.   REASON FOR ADMISSION: Fall.   HOSPITAL COURSE: The patient is an 81 year old African American male with a history of hypertension, diabetes, ESRD and CVA, who was sent from nursing home due to fall. The patient was diagnosed with left hip fracture. For detailed history and physical examination and labs, please refer to the admission note dictated by Dr. Lavetta Nielsen.  1.  Left hip fracture. After admission, the patient got left hip surgery by Dr. Sabra Heck. After surgery, the patient is placed on DVT with heparin subcu 5000 units q.8 hours. 2.  Hypertension is controlled with Lopressor. 3.  Paroxysmal atrial fibrillation. The patient developed paroxysmal atrial fibrillation last night and was treated with Cardizem and Lopressor. The patient's A. fib. converted to normal sinus rhythm and rate is controlled.  4.  Diabetes. The patient has been treated with sliding scale with Levemir and blood sugar is controlled.  5.  ESRD. The patient got hemodialysis as scheduled.   The patient also got physical therapy after surgery. The patient has no complaints, but weakness. Vital signs are stable. He is clinically stable and will be  discharged back to skilled nursing facility today. I discussed the patient's discharge plan with the patient, nurse, social worker and Dr. Humphrey Rolls, cardiologist.   TIME SPENT: About 40 minutes.  ____________________________ Demetrios Loll, MD qc:aw D: 08/10/2013 11:47:25 ET T: 08/10/2013 11:56:06 ET JOB#: [id number]  cc: Demetrios Loll, MD, <Dictator> Demetrios Loll MD ELECTRONICALLY SIGNED 08/10/2013 17:07

## 2014-08-06 NOTE — Consult Note (Signed)
Brief Consult Note: Diagnosis: Bradycardia.   Patient was seen by consultant.   Orders entered.   Comments: Sinus bradycardia 52/min/SVT, Hemodynamically stable, will replace magnesium, and advise phosphorus replacement also. will get EKG/ECHO.  Electronic Signatures: Angelica Ran (MD)  (Signed 11-Jan-15 11:25)  Authored: Brief Consult Note   Last Updated: 11-Jan-15 11:25 by Angelica Ran (MD)

## 2014-08-06 NOTE — Discharge Summary (Signed)
PATIENT NAME:  Michael Acosta, Michael Acosta MR#:  [id number] DATE OF BIRTH:  Jul 12, 1929  DATE OF ADMISSION:  07/04/2013  ADMITTING PHYSICIAN: Dr. Lunette Stands  DATE OF DISCHARGE: 07/10/2013  DISCHARGING PHYSICIAN:  Dr. Gladstone Lighter  For full discharge summary, please look at the summary dictated by Dr. Bettey Costa on 07/09/2013.  HOSPITAL COURSE: In brief, [name] is an 81 year old, African-American male with a history of hypertension, diabetes, end-stage renal disease, on hemodialysis, peripheral arterial disease. Was admitted for a rectal bleed, which resolved.  1. Rectal bleed secondary to radiation proctitis and telangiectasias. Seen by GI while in the hospital. Did not require any transfusion. Hemoglobin has remained stable. Currently on a full liquid diet, advanced to low-residue diet in 2 days.   2.  End-stage renal disease. The patient on Monday/Wednesday/Friday hemodialysis.   3.  Hypertension. He is on medications, they are being continued.  4. Peripheral arterial disease. Vascular and Podiatry were consulted. The patient had an angiogram and angioplasty done to the left leg popliteal vein, but still has left heel ischemic ulcer,  for which he is going to undergo debridement by Dr. Elvina Mattes from Podiatry today. The patient is being discharged on clindamycin and Zosyn, and intraoperative cultures will be taken and followed upon.   DISCHARGE CONDITION: Stable.   DISCHARGE DISPOSITION: To [facility].  Additional time spent is 35 minutes.    ____________________________ Gladstone Lighter, MD rk:mr D: 07/10/2013 11:13:00 ET T: 07/10/2013 20:50:06 ET JOB#: [id number]  cc: Gladstone Lighter, MD, <Dictator> Gladstone Lighter MD ELECTRONICALLY SIGNED 07/22/2013 13:53

## 2014-08-06 NOTE — Consult Note (Signed)
Chief Complaint:  Subjective/Chief Complaint Please see colonoscopy report.  Medium sized angioectasia in the distal rectum treated with argon fulgaration.  Also suspect diverticulum in hepatic flexure region.  Recommend clear liquids for 2 days, then full liquids for 2 days, then low residue for a week.  I would observe for 2 days, as he has shown repeat bleeding after proceedures in the past.  Following.  I will need to recheck in about 2 months, or depending on clinical response.   Electronic Signatures: Loistine Simas (MD)  (Signed 26-Mar-15 16:38)  Authored: Chief Complaint   Last Updated: 26-Mar-15 16:38 by Loistine Simas (MD)

## 2014-08-06 NOTE — Consult Note (Signed)
Chief Complaint:  Subjective/Chief Complaint Patient seen for rectal bleeding.  no overt bleeding since admission, indeed brown stool.  HGB stable in setting of ESRDz.  Michael Acosta has a h/o partial colon resection for large colon polyp, and flexible sigmoidoscopy showing  the small telangiectasias of radiation proctitis ( H/o prostate ca tx).  This can bleed off and on, small amounts to moreso.  Michael Acosta with multiple risks regarding sedated procedure.  In light of his h/o partial colon resection, a full colon prep with colonoscopy may be helpful with treatemt of the telangiectasias.  A less invasive treatment would be the carafate enemas.  The formula would be 2 gm in 20 ml water instilled in rectum twice  a day as a retention enema for 14 days. However I believe this would be more effective for acute radiation proctitis than late rad proctitis.   It is of note that the bleeding generally stops before evaluation can be done.  Patient states he notices blood several times a week, but hgb is relatively stable.  Would recommend prep tomorrow for colonoscopy on wednesday. I will need to discuss further with family members before proceeeding with prep for sedated proceedure. I may not be available tomorrow, but will ask one of my partners to round.   VITAL SIGNS/ANCILLARY NOTES: **Vital Signs.:   23-Mar-15 19:51  Vital Signs Type Routine  Celsius 37  Temperature Source oral  Pulse Pulse 74  Respirations Respirations 20  Systolic BP Systolic BP 101  Diastolic BP (mmHg) Diastolic BP (mmHg) 70  Mean BP 107  Pulse Ox % Pulse Ox % 96  Pulse Ox Activity Level  At rest  Oxygen Delivery Room Air/ 21 %   Brief Assessment:  Cardiac Regular   Respiratory clear BS   Gastrointestinal details normal Soft  Nontender  Nondistended  Bowel sounds normal   Lab Results:  Routine Chem:  23-Mar-15 14:41   Phosphorus, Serum 3.7 (Result(s) reported on 05 Jul 2013 at 03:03PM.)  Routine Hem:  23-Mar-15 05:12   WBC  (CBC) 5.7  RBC (CBC)  3.32  Hemoglobin (CBC)  9.6  Hematocrit (CBC)  30.4  Platelet Count (CBC) 171  MCV 92  MCH 28.9  MCHC  31.6  RDW  17.1  Neutrophil % 76.8  Lymphocyte % 10.5  Monocyte % 10.7  Eosinophil % 1.0  Basophil % 1.0  Neutrophil # 4.4  Lymphocyte #  0.6  Monocyte # 0.6  Eosinophil # 0.1  Basophil # 0.1 (Result(s) reported on 05 Jul 2013 at [facility].)   Assessment/Plan:  Assessment/Plan:  Assessment as above.   Electronic Signatures: Loistine Simas (MD)  (Signed 23-Mar-15 22:48)  Authored: Chief Complaint, VITAL SIGNS/ANCILLARY NOTES, Brief Assessment, Lab Results, Assessment/Plan   Last Updated: 23-Mar-15 22:48 by Loistine Simas (MD)

## 2014-08-14 ENCOUNTER — Encounter: Payer: Self-pay | Admitting: *Deleted

## 2014-08-14 ENCOUNTER — Emergency Department
Admission: EM | Admit: 2014-08-14 | Discharge: 2014-08-14 | Disposition: A | Discharge status: Home / Self Care | Payer: Medicare Other | Attending: Emergency Medicine | Admitting: Emergency Medicine

## 2014-08-14 DIAGNOSIS — I959 Hypotension, unspecified: Secondary | ICD-10-CM | POA: Diagnosis not present

## 2014-08-14 DIAGNOSIS — Z880 Allergy status to penicillin: Secondary | ICD-10-CM | POA: Diagnosis not present

## 2014-08-14 DIAGNOSIS — I10 Essential (primary) hypertension: Secondary | ICD-10-CM | POA: Insufficient documentation

## 2014-08-14 DIAGNOSIS — E119 Type 2 diabetes mellitus without complications: Secondary | ICD-10-CM | POA: Diagnosis not present

## 2014-08-14 HISTORY — DX: Essential (primary) hypertension: I10

## 2014-08-14 LAB — GLUCOSE, CAPILLARY: Glucose-Capillary: 151 mg/dL — ABNORMAL HIGH (ref 70–99)

## 2014-08-14 NOTE — Discharge Summary (Signed)
PATIENT NAME:  [name], Michael Acosta DATE OF BIRTH:  1930/03/06  DATE OF ADMISSION:  07/23/2014 DATE OF DISCHARGE:  07/27/2014  ADMITTING DIAGNOSIS: Unable to get dialysis.   DISCHARGE DIAGNOSES:  1.  Thrombosed left AV graft status post ultrasound-guided vascular access to the left arm arteriovenous graft and tPA, catheter-directed thrombolysis with mechanical rheolytic thrombectomy.  2.  Vomiting.  3.  Suspected aspiration pneumonia with transient fever, now improved.  4.  Type 2 diabetes, insulin-requiring. 5.  Seizure disorder.  6.  Gastroesophageal reflux disease.  7.  Generalized weakness and deconditioning.  8.  End-stage renal disease.  9.  Peripheral vascular disease.   CONSULTANTS: Katha Cabal, M.D., Tama High, M.D.   PERTINENT LABORATORIES AND EVALUATIONS:  Admitting glucose 214, BUN 73, creatinine 8.97, sodium 139, potassium 4.4, chloride 100, CO2 25, phosphorus 2.2. WBC 8.8, hemoglobin 9.1, platelet count 169,000. INR was 0.9. Chest x-ray showed minimal opacification in the left retrocardiac region which may be due to vascular crowding or atelectasis. Repeat chest x-ray on April 11 showed new atelectasis or pneumonia in the lower lobes possibly from underlying aspiration.   HOSPITAL COURSE: Please refer to H and P done by the admitting physician. The patient is an 81 year old African American male with end-stage renal disease on hemodialysis Monday, Wednesday, Friday who was sent due to unable to get dialysis. Patient has an AV fistula, but was not functional. The patient was admitted.  A vascular consult was obtained. The patient had a temporary dialysis catheter placed and he underwent a procedure for declotting of the thrombosed left arm AV graft. The patient tolerated the procedure well. The patient also during the hospitalization was having episode of emesis and developed fever. Chest x-ray did reveal possible aspiration. His fevers are resolved. He  is doing much better and is stable for discharge.   DISCHARGE MEDICATIONS: Zofran 4 mg q. 4 p.r.n. for nausea, midodrine 5 mg 1 tab p.o. t.i.d., atorvastatin 20 daily, gabapentin 300 daily, lamotrigine 25 one tablet p.o. b.i.d., sliding scale insulin, omeprazole 20 daily, Renvela 800 mg one tablet b.i.d with snacks, Tylenol 650 q. 4 p.r.n., vitamin C 2 tablets b.i.d., tramadol 50 q. 6 p.r.n. for pain, Zofran 4 mg q. 8 p.r.n. nausea, Renvela 800 mg three tablets t.i.d. with meals, Levaquin 500 mg 1 tab p.o. q. 24 x 4 days. Home oxygen: No.   DIET:  Renal.   ACTIVITY: As tolerated with PT elevation and follow with primary M.D. in 1 to 2 weeks, hemodialysis as doing previously.   TIME SPENT: 35 minutes on the discharge.   ____________________________ Lafonda Mosses. Posey Pronto, MD shp:sp D: 07/27/2014 10:51:11 ET T: 07/27/2014 11:12:53 ET JOB#: [id number]  cc: Sofhia Ulibarri H. Posey Pronto, MD, <Dictator> Alric Seton MD ELECTRONICALLY SIGNED 07/27/2014 12:19

## 2014-08-14 NOTE — Consult Note (Signed)
General Aspect clotted access   Present Illness The patient isn an 81 year old male with past medical history of end-stage renal disease on hemodialysis Monday, Wednesday, Friday; presenting after unable to get routine dialysis. His last dialysis was this past Wednesday and was without complications. Yesterday the dialysis center attempted to access his left upper extremity AV fistula but were  unsuccessful and noticed no bruit, then sent him to the hospital for further work-up and evaluation. Fortunately the patient denies of any further complaints. No chest pain, shortness of breath. He does complain of some edema which is pretty close to baseline.   PAST MEDICAL HISTORY: Includes end-stage renal disease on hemodialysis Monday, Wednesday, Friday; history of peripheral vascular disease, hypertension, essential; type 2 diabetes insulin requiring; as well as seizure disorder.   Home Medications: Medication Instructions Status  tuberculin purified protein derivative 5 tuberculin units/0.1 mL intradermal solution 0.1 milliliter(s) intradermal every 7 days Active  doxycycline hyclate 100 mg oral delayed release tablet 1 tab(s) orally 2 times a day Active  Ceftin 500 mg oral tablet 1 tab(s) orally 2 times a day Active  Renvela carbonate 800 mg oral tablet 3 tab(s) orally 3 times a day (with meals) Active  ondansetron 4 mg oral tablet 1 tab(s) orally every 8 hours, As Needed - for Nausea, Vomiting Active  traMADol 50 mg oral tablet 1 tab(s) orally every 6 hours, As Needed - for Pain Active  Tylenol 325 mg oral tablet 2 tab(s) orally every 4 hours, As Needed - for Fever, for Pain  Active  Vitamin C 2 tab(s) orally 2 times a day Active  midodrine 5 mg oral tablet 1 tab(s) orally 3 times a day Active  atorvastatin 20 mg oral tablet 1 tab(s) orally once a day Active  gabapentin 300 mg oral capsule 1 cap(s) orally once a day Active  lamoTRIgine 25 mg oral tablet 1 tab(s) orally 2 times a day Active   NovoLOG FlexPen 100 units/mL subcutaneous solution unit(s) subcutaneous 4 times a day (before meals and at bedtime), As Needed per sliding scale:    0-150:  0 units 151- 200:  2 units 201- 250:  4 units  251- 300:  6 units  301- 350:  8 units  351- 400:  10 units  401 and greater:  10 units and call MD Active  omeprazole 20 mg oral delayed release capsule 1 cap(s) orally once a day Active  Renvela carbonate 800 mg oral tablet 1 tab(s) orally 2 times a day (with snacks) Active    PCN: Dizzy/Fainting  Aspirin: Bleeding  BC: Unknown  Case History:  Family History no porphyria or autoimmune disease   Social History negative tobacco, negative ETOH, negative Illicit drugs   Review of Systems:  ROS No TIA/stroke/seizure No heat or cold intolerance No dysuria/hematuria No blurry or double vision No tinnitus or ear pain No rashes or ulcer No suicidal ideation or psychosis No signs of bleeding or easy bruising No SOB/DOE, orthopnea, or sputum No palpitations or chest pain No N/V/D or abdominal pain No joint pain or joint swelling No fever or chills No unintentional weight loss or gain   Physical Exam:  GEN well developed, well nourished, no acute distress   HEENT dry oral mucosa, poor dentition   NECK supple  trachea midline   RESP normal resp effort  no use of accessory muscles   CARD regular rate  positive JVD   ABD denies tenderness  nondistended   EXTR negative cyanosis/clubbing, positive  edema, groin CD&I   SKIN normal to palpation, No rashes, No ulcers   NEURO cranial nerves intact, follows commands, motor/sensory function intact   PSYCH alert, good insight   Nursing/Ancillary Notes: **Vital Signs.:   09-Apr-16 13:50  Vital Signs Type Routine  Temperature Temperature (F) 97.4  Celsius 36.3  Temperature Source oral  Pulse Pulse 67  Respirations Respirations 18  Systolic BP Systolic BP 295  Diastolic BP (mmHg) Diastolic BP (mmHg) 80  Mean BP 99   Pulse Ox % Pulse Ox % 94  Pulse Ox Activity Level  At rest  Oxygen Delivery Room Air/ 21 %   Hepatic:  08-Apr-16 18:43   Bilirubin, Total 0.3 (0.3-1.2 NOTE: New Reference Range  06/21/14)  Alkaline Phosphatase 100 (38-126 NOTE: New Reference Range  06/21/14)  SGPT (ALT) 20 (17-63 NOTE: New Reference Range  06/21/14)  SGOT (AST) 30 (15-41 NOTE: New Reference Range  06/21/14)  Total Protein, Serum 6.7 (6.5-8.1 NOTE: New Reference Range  06/21/14)  Albumin, Serum  3.1 (3.5-5.0 NOTE: New reference range  06/21/14)  Cardiology:  08-Apr-16 16:38   Ventricular Rate 59  Atrial Rate 59  P-R Interval 214  QRS Duration 76  QT 486  QTc 481  P Axis -4  R Axis 15  T Axis 79  ECG interpretation Sinus bradycardia with 1st degree A-V block Prolonged QT Abnormal ECG When compared with ECG of 17-Jul-2014 20:58, Sinus rhythm has replaced Junctional rhythm Nonspecific T wave abnormality no longer evident in Anterior leads ----------unconfirmed---------- Confirmed by OVERREAD, NOT ([id number]), editor PEARSON, BARBARA ([id number]) on 07/23/2014 9:47:35 AM  Routine Chem:  08-Apr-16 18:00   Result Comment CBC - CNL/SPECIMEN CLOTTED/NEEDS REDRAW  - C/ASHLEY ROSS/1843/07-22-14/RWW  Result(s) reported on 22 Jul 2014 at 06:43PM.    18:24   Potassium, Serum 4.6 (3.5-5.1 NOTE: New Reference Range  06/21/14)    18:43   Glucose, Serum  118 (65-99 NOTE: New Reference Range  06/21/14)  BUN  46 (6-20 NOTE: New Reference Range  06/21/14)  Creatinine (comp)  8.16 (0.61-1.24 NOTE: New Reference Range  06/21/14)  Sodium, Serum 140 (135-145 NOTE: New Reference Range  06/21/14)  Potassium, Serum 4.5 (3.5-5.1 NOTE: New Reference Range  06/21/14)  Chloride, Serum 104 (101-111 NOTE: New Reference Range  06/21/14)  CO2, Serum 23 (22-32 NOTE: New Reference Range  06/21/14)  Calcium (Total), Serum  8.0 (8.9-10.3 NOTE: New Reference Range  06/21/14)  eGFR (African American)  6  eGFR (Non-African  American)  6 (eGFR values <79m/min/1.73 m2 may be an indication of chronic kidney disease (CKD). Calculated eGFR is useful in patients with stable renal function. The eGFR calculation will not be reliable in acutely ill patients when serum creatinine is changing rapidly. It is not useful in patients on dialysis. The eGFR calculation may not be applicable to patients at the low and high extremes of body sizes, pregnant women, and vegetarians.)  Anion Gap 13  Magnesium, Serum 1.8 (1.7-2.4 THERAPEUTIC RANGE: 4-7 mg/dL TOXIC: > 10 mg/dL  ----------------------- NOTE: New Reference Range  06/21/14)  09-Apr-16 15:17   Phosphorus, Serum  1.8 (2.5-4.6 NOTE: New Reference Range  06/21/14)  Routine Coag:  08-Apr-16 18:43   Activated PTT (APTT) 25.2 (A HCT value >55% may artifactually increase the APTT. In one study, the increase was an average of 19%. Reference: "Effect on Routine and Special Coagulation Testing Values of Citrate Anticoagulant Adjustment in Patients with High HCT Values." American Journal of Clinical Pathology 2006;126:400-405.)  Prothrombin 12.8 (11.4-15.0 NOTE: New  Reference Range  05/13/14)  INR 0.9 (INR reference interval applies to patients on anticoagulant therapy. A single INR therapeutic range for coumarins is not optimal for all indications; however, the suggested range for most indications is 2.0 - 3.0. Exceptions to the INR Reference Range may include: Prosthetic heart valves, acute myocardial infarction, prevention of myocardial infarction, and combinations of aspirin and anticoagulant. The need for a higher or lower target INR must be assessed individually. Reference: The Pharmacology and Management of the Vitamin K  antagonists: the seventh ACCP Conference on Antithrombotic and Thrombolytic Therapy. QIWLN.9892 Sept:126 (3suppl): [id number]. A HCT value >55% may artifactually increase the PT.  In one study,  the increase was an average of 25%. Reference:   "Effect on Routine and Special Coagulation Testing Values of Citrate Anticoagulant Adjustment in Patients with High HCT Values." American Journal of Clinical Pathology 2006;126:400-405.)  Routine Hem:  08-Apr-16 18:00   WBC (CBC) -  RBC (CBC) -  Hemoglobin (CBC) -  Hematocrit (CBC) -  Platelet Count (CBC) -  MCV -  MCH -  MCHC -  RDW -  Neutrophil % -  Lymphocyte % -  Monocyte % -  Eosinophil % -  Basophil % -  Neutrophil # -  Lymphocyte # -  Monocyte # -  Eosinophil # -  Basophil # -  Bands -  Segmented Neutrophils -  Lymphocytes -  Variant Lymphocytes -  Monocytes -  Eosinophil -  Basophil -  Metamyelocyte -  Myelocyte -  Promyelocyte -  Blast-Like -  Other Cells -  NRBC -  Diff Comment 1 -  Diff Comment 2 -  Diff Comment 3 -  Diff Comment 4 -  Diff Comment 5 -  Diff Comment 6 -  Diff Comment 7 -  Diff Comment 8 -  Diff Comment 9 -  Diff Comment 10 - (Result(s) reported on 22 Jul 2014 at 06:43PM.)    18:46   WBC (CBC) 8.0  RBC (CBC)  3.31  Hemoglobin (CBC)  10.1  Hematocrit (CBC)  31.5  Platelet Count (CBC) 207  MCV 95  MCH 30.5  MCHC 32.1  RDW  15.4  Neutrophil % 83.6  Lymphocyte % 7.0  Monocyte % 7.2  Eosinophil % 1.6  Basophil % 0.6  Neutrophil #  6.6  Lymphocyte #  0.6  Monocyte # 0.6  Eosinophil # 0.1  Basophil # 0.1 (Result(s) reported on 22 Jul 2014 at 08:11PM.)   XRay:    08-Apr-16 19:28, Chest Portable Single View  Chest Portable Single View   REASON FOR EXAM:    lack of dialysis, eval for pulm edema  COMMENTS:       PROCEDURE: DXR - DXR PORTABLE CHEST SINGLE VIEW  - Jul 22 2014  7:28PM     CLINICAL DATA:  Weakness.  Possible clotted dialysis access.    EXAM:  PORTABLE CHEST - 1 VIEW    COMPARISON:  07/17/2014    FINDINGS:  Lungs are hypoinflated with minimal left retrocardiac opacification  which may be due to vascular crowding, atelectasis or early  infection. Cardiomediastinal silhouette and remainder the exam  is  unchanged.     IMPRESSION:  Minimal opacification in the left retrocardiac region which may be  due to vascular crowding versus atelectasis or infection.      Electronically Signed    By: Marin Olp M.D.    On: 07/22/2014 19:58         Verified By: Melene Plan.  Derrel Nip, M.D.,    Impression 1.  Left upper extremity arteriovenous fistula thrombosis.  He is in need of dialysis and I will place a temp cath for the immediate use.  I will make plans for thrombectomy this week 2.  End-stage renal disease on hemodialysis, mild volume overload; however, no asymptomatic, no urgent need for hemodialysis at this time; we will consult nephrology for continuation of routine dialysis.  3.  Type 2 diabetes, insulin-requiring. Hold p.o. agents, add insulin sliding scale with q 6hour accu checks  4.  Seizure disorder, well controlled. Continue with Lamictal and gabapentin.    5.  Gastroesophageal reflux disease without esophagitis  proton pump inhibitor therapy.   Plan level 3 consult.   Electronic Signatures: Hortencia Pilar (MD)  (Signed 09-Apr-16 21:54)  Authored: General Aspect/Present Illness, Home Medications, Allergies, History and Physical Exam, Vital Signs, Labs, Radiology, Impression/Plan   Last Updated: 09-Apr-16 21:54 by Hortencia Pilar (MD)

## 2014-08-14 NOTE — Discharge Summary (Signed)
PATIENT NAME:  Michael Acosta, Michael Acosta MR#:  [id number] DATE OF BIRTH:  01-29-1930  DATE OF ADMISSION:  07/18/2014 DATE OF DISCHARGE:    DISCHARGE DIAGNOSES:  1. Healthcare-associated pneumonia.  2. Sepsis and lactic acidosis on presentation due to his pneumonia.  3. Diabetes.   4. End-stage renal disease on hemodialysis.   CONDITION ON DISCHARGE: Stable.   MEDICATIONS ON DISCHARGE:  1. Keppra 500 mg once a day.  2. NovoLog sliding scale injections as needed.  3. Atorvastatin 20 mg once a day.  4. Vitamin C 500 mg 2 times a day.  5. Omeprazole 20 mg oral delayed-release once a day.  7. Ondansetron 4 mg oral every 8 hours as needed for nausea and vomiting.  8. Tramadol 50 mg oral every 6 hours as needed for pain.  9. Lamotrigine 25 mg oral 2 tablets 2 times a day.  10. Gabapentin 300 mg oral once a day.  11, Ceftin 500 mg oral 2 times a day for 5 days.  12. Doxycycline 100 mg oral for 2 times a day for 5 days.  13. Midodrine 5 mg oral tablet 3 times a day.   DIET ON DISCHARGE: Low-sodium renal diet and regular consistency.   ACTIVITY: As tolerated.   TIMEFRAME TO FOLLOW: Within 1-2 weeks with primary care physician Dr. Nicky Pugh.     HISTORY OF PRESENTING ILLNESS: An 81 year old male with episode of shaking and vomiting as well as altered mental status, is unclear for the history exactly shaking and vomiting episode was. The patient came to Emergency Room. He was afebrile, workup was found that he had SIRS criteria with low white cell count and fever of 102, blood pressure was 70/50, and chest x-ray showed left lung opacity concerning for pneumonia, so he was admitted for further management of his issues with IV Levophed drip in CCU.   HOSPITAL COURSE AND STAY:  1.  Healthcare-associated pneumonia and sepsis. As the patient presented with this he was on Levophed IV drip and he was on antibiotic cefuroxime.  His blood pressure gradually improved with treatment and he came off the  Levophed drip.  We were able to give him midodrine to help him.  Blood pressure came stable.  2.  Encephalopathy due to infection. This was present on admission, but the patient became completely alert and oriented after treatment of 2 days with broad-spectrum antibiotic. 3.  End-stage renal disease on hemodialysis, continued as per the nephrology.  4.  Diabetes.  We had to just put him on sliding scale coverage, no basal insulin, and he remained stable with that, so we are discharging without any baseline insulin to take at home.   5.  Healthcare-associated pneumonia.  He was on vancomycin, aztreonam, and Levaquin, but after improvement for 2 days we gave him cefuroxime and doxycycline to cover his pneumonia at nursing home.   CONSULTS [place]: Nephrology with Dr. Juleen China and pulmonary with Dr. Mortimer Fries.   IMPORTANT LABORATORY RESULTS IN THE HOSPITAL:  1.  Troponin level 0.25.  2.  WBC 3.6, hemoglobin is 11.6, and platelet count is 169,000, MCV is 94.  3.  Glucose is 203, BUN 72, creatinine 9.37, sodium 138, potassium is 4.9, chloride 100, and CO2 is 20.  4.  Blood culture is negative.  5.  Lactic acid 2.5.  6.  Chest x-ray portable shows left lung opacity concerning of pneumonia and symmetric pulmonary edema.   7.  WBC count on further followup came to 10.1 and 8.8,  hemoglobin 9.1.   TOTAL TIME SPENT ON THIS DISCHARGE: 40 minutes.     ____________________________ Ceasar Lund Anselm Jungling, MD vgv:bu D: 07/20/2014 13:09:31 ET T: 07/20/2014 13:23:33 ET JOB#: [id number]  cc: Ceasar Lund. Anselm Jungling, MD, <Dictator> Mikeal Hawthorne. Brynda Greathouse, MD Vaughan Basta MD ELECTRONICALLY SIGNED 07/21/2014 0:42

## 2014-08-14 NOTE — ED Notes (Signed)
Pt to ED via EMS from [facility] center with hypotension. Per EMS, facility took pt's BP was in the 70's and 80's. For EMS pt BP in the 90's, on arrival to ED pt BP 92/76. Pt is current hemodialysis pt on mon/wed/fri. Pt denies any lightheadedness, dizziness, SOB, or chest pain at this time. MD shaevitz at bedside on arrival.

## 2014-08-14 NOTE — H&P (Signed)
PATIENT NAME:  Michael Acosta, Michael Acosta MR#:  [id number] DATE OF BIRTH:  [birth year]  DATE OF ADMISSION:  07/18/2014  PRIMARY CARE PHYSICIAN: Nicky Pugh, MD   CHIEF COMPLAINT: Vomiting.   HISTORY OF PRESENT ILLNESS: This is an 81 year old male who comes from [facility] for an episode of shaking and vomiting, as well as altered mental status. It is unclear per the history what exactly the shaking and vomiting episode was. When he presented to the ED here he was febrile. On workup he was found to met SIRS criteria with a white count low at 3.6, fever to 102, and blood pressure relatively stable but it was on the lowish end when he first was here, in the 70s/50s. Chest x-ray shows left lung opacity concerning for pneumonia. The patient was accompanied here by his daughter who had already left by the time this interviewer saw the patient. However, she reported to the ED physician the same story. The rest of the history is from chart review as the patient is not communicating at this time. In the ED, initial lactic acid was slightly elevated at 2.5. Repeat after some fluids, which his blood pressure responded well to, coming up to the 130s/90s. Repeat lactic acid after those fluids was 1.9, and so hospitalists were called for admission for sepsis with HCAP.   PAST MEDICAL HISTORY: CVA, TIA, peripheral vascular disease, end-stage renal disease on hemodialysis, dementia, hypertension, diabetes mellitus, seizures, prostate cancer.   MEDICATIONS: Tramadol 50 mg q. 6 hours p.r.n. pain, RisaQuad 1 tablet b.i.d., Renvela 800 mg 3 tablets t.i.d., Zofran 4 mg q. 8 hours p.r.n. nausea, Prilosec 20 mg daily, NovoLog sliding scale insulin, Lopressor 25 mg b.i.d. (this is on dialysis days if his heart rate is greater than 130), Keppra 500 mg daily, Levemir 10 units at bedtime, Lamictal 25 mg 2 tablets b.i.d., gabapentin 300 mg at bedtime, Lipitor 20 mg daily.   PAST SURGICAL HISTORY: Colon resection, AV fistula  placement.   ALLERGIES: ASPIRIN CAUSED GI BLEED IN THE PAST, PENICILLIN MAKES HIM DIZZY, BC ON CHART REVIEW (I AM ASSUMING THIS MEANS OF BC POWDER) LIKELY CONTRIBUTED TO GASTROINTESTINAL BLEED HISTORY IN THE PAST AS WELL.   FAMILY HISTORY: Includes hypertension.   SOCIAL HISTORY: The patient is a nonsmoker, does not use alcohol, does not use illicit drugs.   REVIEW OF SYSTEMS: Unable to obtain due to the patient's condition and not responding; however, the report per EMS was shaking and vomiting along with fever.   PHYSICAL EXAMINATION:  VITAL SIGNS: Blood pressure 126/103, pulse 78, temperature 100.6, respirations 24 with 100% O2 saturations on room air.  GENERAL: This patient is a well-nourished appearing elderly gentleman lying in bed, not responding, but not in acute distress.  HEENT: Pupils are equal, round, and reactive to light and accommodation. He has no scleral icterus. He has moist mucosal membranes. He does not follow commands enough to check extraocular movements.  NECK: Supple with no masses. No cervical adenopathy. No JVD.  RESPIRATORY: He has some mild tachypnea, but his chest sounds clear in all lung fields. His breathing is a little bit shallow and he is in mild respiratory distress. No wheezing or rales auscultated.  CARDIOVASCULAR: He has a regular rate and rhythm. No murmurs, rubs, or gallops. Good pedal pulses with no lower extremity edema.  ABDOMEN: Soft, nontender, nondistended with good bowel sounds.  MUSCULOSKELETAL: Not following commands, unable to assess muscular strength in the extremities. No distal cyanosis or clubbing.  SKIN: No rash or lesions noted on exam. Skin was warm, dry, and intact.  LYMPHATIC: No adenopathy.  NEUROLOGICAL: Unable to assess given the patient's current status. He does not have any contractures.  PSYCHIATRIC: The patient is not alert. He is not responding, not oriented, not following commands.   LABORATORY DATA: White count low at 3.6,  hemoglobin 11.6, hematocrit 35.6, platelets 169,000. Sodium 138, potassium 4.9, chloride 100, bicarbonate 20, BUN 72, creatinine 9.37. This is not baseline for him, although he bounces all around because he is with end-stage renal disease on dialysis. Glucose 203. Lactic acid 2.5, then subsequently 1.9. Total protein 6.7, albumin 3.4, total bilirubin 0.5, alkaline phosphatase 131, AST 25, ALT 20. Troponin 0.25.   RADIOLOGY: Chest x-ray as per HPI shows a left lung opacity concerning for pneumonia.   ASSESSMENT AND PLAN:  1.  Healthcare-associated pneumonia. The patient was given due to his PENICILLIN ALLERGY vancomycin, aztreonam, and Levaquin in the Emergency Department. We will continue these antibiotics and so we admitted him inpatient to telemetry. Ideally, he would go to the stepdown, but there are no beds at this time. Given his persistently stable blood pressure and good oxygen saturations, we will admit him to telemetry at this time with telemetry for cardiac monitoring. We will continue antibiotics as stated and we will provide respiratory support as needed, though he is stable at this time.  2.  Sepsis, meets criteria with respiratory rate, fever, and white count. Hemodynamically stable at this time. We will panculture him. We will be very cautious with fluids. We will not give any additional fluids at this time after initial resuscitation due to his end-stage renal disease in an effort not to fluid overload him, especially given that his lactate has returned to within a normal value with initial fluid resuscitation and his blood pressure corrected quickly. Continue antibiotics as above.  3.  Acute encephalopathy. This is likely due to sepsis from healthcare-associated pneumonia, although with a seizure history it is unclear if there was some kind of seizure event going on here. The patient does not seem to be actively seizing at this time. We will continue his home Keppra converting it to an  intravenous dose since patient is not able to take anything by mouth at this time, and we will monitor him closely.  4.  Elevated troponin. Given his end-stage renal disease and the fact that his troponin is only mildly elevated at 0.24, it is possible that this is from troponin leak. His EKG was okay in the Emergency Department. However, we will trend the troponins and if there is any significant kind of rise we will have to treat him for non-ST elevation myocardial infarction. At that point would consider cardiology consult.  5.  End-stage renal disease on hemodialysis. We will consult nephrology for this for hemodialysis support as he is supposed to be getting dialysis tomorrow. Might consider continuous renal replacement therapy in this septic patient. 6.  Diabetes mellitus. The patient is nothing by mouth currently. We will hold his regular insulin doses and in lieu we will use sliding scale insulin until he is alert and eating again.  7.  Hypertension. We will hold his antihypertensives at this time as his blood pressure was tending to be kind of lowish. We can add them back as needed or use intravenous as needed.  8.  Deep vein thrombosis prophylaxis with subcutaneous heparin.  9.  CODE STATUS on this patient will need to be clarified as the  daughter was no longer with the patient when this interviewer performed his examination and we were unable to reach family members subsequently. Of note, his CODE STATUS was full code in the past per chart review. However, this admission was greater than 6 months ago. Family will need to be contacted to further clarify CODE STATUS on this patient in the morning.   TIME SPENT ON THIS ADMISSION: 55 minutes.    ____________________________ Wilford Corner. Jannifer Franklin, MD dfw:TT D: 07/18/2014 01:46:35 ET T: 07/18/2014 03:04:29 ET JOB#: [id number]  cc: Wilford Corner. Jannifer Franklin, MD, <Dictator> Mordechai Matuszak Fawn Kirk MD ELECTRONICALLY SIGNED 07/18/2014 4:46

## 2014-08-14 NOTE — ED Provider Notes (Addendum)
[facility] Emergency Department Provider Note    ____________________________________________  Time seen: On arrival with paramedics.  I have reviewed the triage vital signs and the nursing notes.   HISTORY  Chief Complaint Hypotension      HPI Michael Acosta is a 81 y.o. male with a history of hypertension, ESRD and diabetes who presents today for an episode of hypotension. Per the paramedics the patient had one blood pressure of 60 systolic at his skilled nursing facility and became shaky. After then he returned to his normal blood pressure and his symptoms were fully resolved. He denies any pain. The patient has no complaints on arrival to the emergency department. There were no complaint of nausea vomiting or diarrhea. No palpitations or chest pain. Per the medics the patient has dementia but is at his baseline.     Past Medical History  Diagnosis Date  . Renal disorder   . Diabetes mellitus without complication   . Hypertension     There are no active problems to display for this patient.   History reviewed. No pertinent past surgical history.  No current outpatient prescriptions on file.  Allergies Aspirin; Bc powder; and Penicillins  History reviewed. No pertinent family history.  Social History History  Substance Use Topics  . Smoking status: Never Smoker   . Smokeless tobacco: Never Used  . Alcohol Use: No    Review of Systems  Constitutional: Negative for fever. Eyes: Negative for visual changes. ENT: Negative for sore throat. Cardiovascular: Negative for chest pain. Respiratory: Negative for shortness of breath. Gastrointestinal: Negative for abdominal pain, vomiting and diarrhea. Genitourinary: Negative for dysuria. Musculoskeletal: Negative for back pain. Skin: Negative for rash. Neurological: Negative for headaches, focal weakness or numbness.  Patient is awake and alert. Has no complaints however, the ROS may be  confounded by his dementia.  10-point ROS otherwise negative.  ____________________________________________   PHYSICAL EXAM:  VITAL SIGNS: ED Triage Vitals  Enc Vitals Group     BP --      Pulse Rate 08/14/14 2043 70     Resp 08/14/14 2043 18     Temp 08/14/14 2043 97.9 F (36.6 C)     Temp Source 08/14/14 2043 Oral     SpO2 08/14/14 2043 95 %     Weight 08/14/14 2043 160 lb (72.576 kg)     Height 08/14/14 2043 5\' 5"  (1.651 m)     Head Cir --      Peak Flow --      Pain Score --      Pain Loc --      Pain Edu? --      Excl. in [place]? --      Constitutional: Alert and oriented. Well appearing and in no distress. Eyes: Conjunctivae are normal. PERRL. Normal extraocular movements. ENT   Head: Normocephalic and atraumatic.   Nose: No congestion/rhinnorhea.   Mouth/Throat: Mucous membranes are moist.   Neck: No stridor. Hematological/Lymphatic/Immunilogical: No cervical lymphadenopathy. Cardiovascular: Normal rate, regular rhythm. Normal and symmetric distal pulses are present in all extremities. No murmurs, rubs, or gallops. Respiratory: Normal respiratory effort without tachypnea nor retractions. Breath sounds are clear and equal bilaterally. No wheezes/rales/rhonchi. Gastrointestinal: Soft and nontender. No distention. No abdominal bruits. There is no CVA tenderness. Genitourinary: Deferred Musculoskeletal: Nontender with normal range of motion in all extremities. No joint effusions.  No lower extremity tenderness nor edema.  Patient with palpable thrill to the dialysis graft in his left upper  extremity. Old graft in the right upper extremity with palpable pulse. Neurologic:  Normal speech and language. No gross focal neurologic deficits are appreciated. Speech is normal. No gait instability. Skin:  Skin is warm, dry and intact. No rash noted. Psychiatric: Mood and affect are normal. Speech and behavior are normal. Patient exhibits appropriate insight and  judgment.  ____________________________________________   EKG   ____________________________________________    RADIOLOGY    ____________________________________________   PROCEDURES    ____________________________________________   INITIAL IMPRESSION / ASSESSMENT AND PLAN / ED COURSE  Pertinent labs & imaging results that were available during my care of the patient were reviewed by me and considered in my medical decision making (see chart for details).  Patient is well-appearing without complaints. Orthostatic vital signs were taken with an increase in blood pressure from lying to standing position. More importantly the patient did not have any symptoms of lightheadedness or tachycardia unchanged from lying to sitting to standing. Unclear of the cause of episode of hypotension. However, from the history was a singular episode which resolved quickly. Additionally, the patient's blood sugar was found to be in the 100s. The patient will be discharged home. Additionally there were no findings of acute sepsis on exam.  ____________________________________________   FINAL CLINICAL IMPRESSION(S) / ED DIAGNOSES  Transient hypotension now resolved. Acute, initial visit.   Doran Stabler, PA-C 08/14/14 2106  The patient's home phone number was called at 9 PM however there was no answer. A message was left on the machine with a return phone number for the emergency department.  Doran Stabler, PA-C 08/14/14 2107

## 2014-08-14 NOTE — Op Note (Signed)
PATIENT NAME:  Michael Acosta, Michael Acosta MR#:  [id number] DATE OF BIRTH:  1929-12-02  DATE OF OPERATION:  07/25/2014  PREOPERATIVE DIAGNOSES: 1. End-stage renal disease.  2. Thrombosed left arm arteriovenous graft.   3. Dementia.  4. Hypertension.  5. Diabetes.   POSTOPERATIVE DIAGNOSES:   1. End-stage renal disease.  2. Thrombosed left arm arteriovenous graft.   3. Dementia.  4. Hypertension.  5. Diabetes.  6. Thrombosis of arteries distal to arteriovenous graft.  PROCEDURES PERFORMED:  1. Ultrasound-guided vascular access to the left arm arteriovenous graft in both an antegrade and retrograde fashion crossing.  2. Left upper extremity shuntogram and central venogram.  3. Catheter placement into radial and ulnar arteries from retrograde sheath.  4. Left upper extremity angiogram.  5. Catheter-directed thrombolysis with 4 mg of tPA to the brachial artery, upper arm arteriovenous graft, and to the axillary vein.  6. Mechanical rheolytic thrombectomy to the brachial artery to its bifurcation, the entirety of the arteriovenous graft, and to the axillary vein.  7. Fogarty embolectomy of brachial artery and proximal graft.  8. Percutaneous transluminal angioplasty of radial artery in the proximal and mid segment with 3 mm diameter angioplasty balloon.  9. Percutaneous transluminal angioplasty of ulnar artery in the mid-to-distal segment with 3 mm diameter angioplasty balloon.  10.  Percutaneous transluminal angioplasty of access sites and mid graft with 7 mm diameter high-pressure angioplasty balloon.  11.  Percutaneous transluminal angioplasty of venous anastomosis for thrombus and stenosis with 7 and 8 mm diameter high-pressure angioplasty balloons.   SURGEON:  Algernon Huxley, M.D.   ANESTHESIA:  Local with moderate conscious sedation.   BLOOD LOSS:  25 mL.    CONTRAST USED:  55 mL of Visipaque.   INDICATION FOR PROCEDURE:  This is an 81 year old gentleman admitted to the hospital this  weekend.  He had a temporary dialysis catheter placed for dialysis access, and he was brought down today for attempt at salvage of his dialysis graft.  The risks and benefits were discussed.  Informed consent was obtained.   DESCRIPTION OF PROCEDURE: The patient was brought to the vascular suite.  The left upper extremity was sterilely prepped and draped, and a sterile surgical field was created. Ultrasound was used to access the graft in both an antegrade and retrograde fashion crossing due to the pulseless nature of the graft.  This was done and permanent image was taken in each direction, and 6-French sheaths were placed.  The patient was then given 4000 units of intravenous heparin.  Initial imaging showed thrombosis of the entirety of the AV graft with thrombus seen in the brachial artery distal to the graft.  At this point, I used the AngioJet AVX catheter to put 4 mg of tPA throughout the graft and into the brachial artery well beyond the graft to its bifurcation.  This was allowed to dwell for 10 minutes.  I then performed mechanical rheolytic thrombectomy of the AV graft throughout its course and into the axillary vein from the antegrade sheath.  Through the retrograde sheath, the proximal portion of the graft was treated with mechanical rheolytic thrombectomy, and I took the AngioJet AVX catheter to the brachial bifurcation to treat the thrombus in the brachial artery.  Thrombus remained in the brachial artery as well as the proximal portion of the graft.  I used a Fogarty embolectomy balloon to try to pull thrombus from the brachial artery and the proximal portion of the graft back into the midportion of the  graft and out the sheath.  This was partially successful, although a large blob of thrombus now was at the brachial bifurcation and the proximal portions of the radial and ulnar arteries.  The proximal portion of the graft was cleared; however, there was thrombus and narrowing within the  midportions.  There was a previously placed stent that was somewhat collapsed at the access site.  Imaging also appeared to show what may have been stenosis but certainly thrombus within the venous anastomotic stent separate from the access site stenoses.  Through the antegrade sheath, I treated the mid graft access sites and then separately the venous anastomotic thrombosis to try to get some flow through the graft.  Again, this was partially successful. Through the retrograde sheath, I put a Kumpe catheter out in the brachial artery and began selective imaging.  To try to restore perfusion to the hand, I chased clot out first the radial artery and second the ulnar artery with selective shots to both radial and ulnar arteries with the Kumpe catheter and a 0.018 Advantage wire.  The thrombus in the radial artery was in the proximal-to- mid segments.  After I cleared the brachial artery, I used a 3 mm diameter angioplasty balloon to try to marginate the thrombus.  His radial artery was small and diseased distally anyway.  This did restore flow through the radial artery for the majority.  I then turned my attention to the ulnar artery.  Using the Kumpe catheter and Advantage wire, I was able to cannulate this.  With cannulation and advancement of the catheter, the thrombus moved distally.  The proximal portion of the ulnar artery was now widely patent but the mid and distal segments had thrombosis.  I advanced a wire into the hand.  I took the 3 mm diameter angioplasty balloon from the wrist to the mid ulnar artery, marginating the thrombus and improving flow to the hand.  At this point, I felt we had restored enough flow to the hand to be safe, and the proximal portion of the graft was improved.  I was able to remove the retrograde sheath and a 4-0 Monocryl pursestring suture was placed.  Upsized to an 8 mm diameter angioplasty balloon at the venous anastomosis and the venous anastomotic stent, and this again was a  high-pressure balloon.  I used a 7 mm balloon where the retrograde sheath had been which was just beyond the venous access site.  The graft now had an excellent thrill, and imaging was performed showing this to now be widely patent without significant residual stenosis.  The remainder of the central venous circulation was patent.  At this point, I elected to terminate the procedure.  The sheath was removed, 4-0 Monocryl pursestring suture was placed, pressure was held, and sterile dressing was placed.  The patient tolerated the procedure well and was taken to the recovery room in stable condition.     ____________________________ Algernon Huxley, MD jsd:kc D: 07/25/2014 15:55:29 ET T: 07/25/2014 18:11:20 ET JOB#: [id number]  cc: Algernon Huxley, MD, <Dictator> Algernon Huxley MD ELECTRONICALLY SIGNED 08/04/2014 12:50

## 2014-08-14 NOTE — Op Note (Signed)
PATIENT NAME:  Michael Acosta, NGHIEM MR#:  [id number] DATE OF BIRTH:  Mar 04, 1930  DATE OF PROCEDURE:  07/23/2014  PREOPERATIVE DIAGNOSES:  1.  End-stage renal disease requiring hemodialysis.  2.  Complication of dialysis device with thrombosis of left arm brachial axillary dialysis graft.   POSTOPERATIVE DIAGNOSES:  1.  End-stage renal disease requiring hemodialysis.  2.  Complication of dialysis device with thrombosis of left arm brachial axillary dialysis graft.   PROCEDURE: Insertion of right femoral temporary dialysis catheter.   SURGEON: Katha Cabal, MD   DESCRIPTION OF PROCEDURE: The patient is in his hospital bed. He is positioned supine and his right groin is prepped and draped in a sterile fashion. Ultrasound is placed in a sterile sleeve and the femoral vein is identified. It is echolucent and compressible indicating patency. Image is recorded for the permanent record. Under real-time visualization, the vein is accessed with a Seldinger needle; 1% lidocaine has been infiltrated in the soft tissues. A J-wire is then advanced without difficulty, counterincision is made and a dilator is passed over the wire. A triple-lumen catheter is then placed and all 3 lumens aspirate and flush easily. It is secured to the skin of the thigh with 2-0 nylon, and a sterile dressing is applied. The patient tolerated the procedure well and there were no immediate complications.   SUMMARY: Successful insertion of temporary dialysis catheter, Trialysis type.    ____________________________ Katha Cabal, MD ggs:bm D: 07/23/2014 17:08:18 ET T: 07/24/2014 01:42:01 ET JOB#: [id number]  cc: Katha Cabal, MD, <Dictator> Katha Cabal MD ELECTRONICALLY SIGNED 07/26/2014 15:14

## 2014-08-14 NOTE — ED Notes (Signed)
Pt wife arrived to ED, would like to speak to MD prior to pt and her leaving. MD schaevitz made aware. Discharge instructions all complete at this time.

## 2014-08-14 NOTE — H&P (Signed)
PATIENT NAME:  Michael Acosta, Michael Acosta MR#:  [id number] DATE OF BIRTH:  [birth year]  DATE OF ADMISSION:  07/22/2014    PRIMARY CARE PHYSICIAN: Marden Noble, MD    CHIEF COMPLAINT:  Unable to get dialysis.   HISTORY OF PRESENT ILLNESS: An 81 year old African American gentleman with past medical history of end-stage renal disease on hemodialysis Monday, Wednesday, Friday; presenting after unable to get routine dialysis. Once again dialysis Monday, Wednesday, Friday; he did complete dialysis Wednesday without any complications. He went from his nursing facility to dialysis center. They attempted to access his left upper extremity AV fistula but were  unsuccessful and noticed no bruit, then sent him to the hospital for further work-up and evaluation. Fortunately the patient denies of any further complaints. No chest pain, shortness of breath. He does complain of some edema which is pretty close to baseline.   REVIEW OF SYSTEMS:   CONSTITUTIONAL: Denies fevers, chills, fatigue, weakness.  EYES:  Denies blurred vision, double vision, or eye pain.  EARS, NOSE, AND THROAT: Denies tinnitus, ear pain, hearing loss.  RESPIRATORY: Denies cough, wheeze, shortness of breath.  CARDIOVASCULAR: Denies chest pain, palpitations; positive for edema as stated above.  GASTROINTESTINAL: Denies nausea, vomiting, diarrhea, or abdominal pain.  GENITOURINARY: Denies dysuria or hematuria.  ENDOCRINE: Denies nocturia or thyroid problems.  HEMATOLOGIC AND LYMPHATIC: Denies easy bruising or bleeding.  SKIN:  Denies rash or lesion.  MUSCULOSKELETAL: Denies pain in neck, back, shoulder, knees, hips or arthritic symptoms.  NEUROLOGIC: Denies paralysis, paresthesias.  PSYCHIATRIC: Denies anxiety or depressive symptoms. Otherwise a full review of systems report by me is negative.   PAST MEDICAL HISTORY: Includes end-stage renal disease on hemodialysis Monday, Wednesday, Friday; history of peripheral vascular disease, hypertension,  essential; type 2 diabetes insulin requiring; as well as seizure disorder.   SOCIAL HISTORY: No alcohol, tobacco, or drug usage. Currently resides at a nursing facility.   FAMILY HISTORY: Positive for hypertension.   ALLERGIES: INCLUDE ASPIRIN AND PENICILLIN.   HOME MEDICATIONS: Include tramadol 50 mg p.o. q. 6 hours as needed for pain, Tylenol 325 mg 2 tabs p.o. q. 4 hours as needed for pain or fever; gabapentin 300 mg p.o. daily, Lamictal 25 mg p.o. b.i.d., NovoLog per sliding scale 4 times daily; Zofran 4 mg p.o. q. 8 hours as needed for nausea, vomiting; atorvastatin 20 mg p.o. daily, Renvela 800 mg 3 tablets 3 times daily with meals, 1 tablet 3 times a day with snacks, Prilosec 20 mg p.o. daily, vitamin C 2 tabs p.o. b.i.d.   PHYSICAL EXAMINATION: VITAL SIGNS: Temperature 97.4, heart rate 62, respirations 18, blood pressure 115/82, saturating 100% on room air, weight 88.1 kg, BMI 28.5.  GENERAL: Well-nourished, well-developed, African American gentleman currently in no acute distress.  HEAD: Normocephalic, atraumatic.  EYES: Pupils are equal, round, reactive to light, extraocular muscles intact, no scleral icterus. MOUTH:  Moist mucous membranes, dentition intact, no abscess noted.  EARS, NOSE, AND THROAT: Clear without exudates. No external lesions.  NECK: Supple. No thyromegaly. No nodules. No JVD.  PULMONARY: Clear to auscultation bilaterally without wheezes, rales, rhonchi, no use of accessory muscles, good respiratory effort, chest nontender to palpation.  CARDIOVASCULAR: S1, S2, regular rate and rhythm; no murmurs, rubs, gallops; trace edema to the shins bilaterally; pedal pulses 2+ bilaterally.  GASTROINTESTINAL: Soft, nontender, nondistended. No masses. Positive bowel sounds. No hepatosplenomegaly.  MUSCULOSKELETAL:  no swelling , clubbing, edema. Range of motion is full in all extremities.  NEUROLOGIC: Cranial nerves II through XII  intact. No gross focal neurologic deficits,  sensation intact, reflexes intact.  SKIN: No ulcerations, lesions, rashes, or cyanosis; left upper extremity fistula in place; however, without prominent bruit; turgor intact.   PSYCHIATRIC: Mood and affect are within normal limits. The patient is awake, alert, oriented x3, insight and judgment intact.   DIAGNOSTIC DATA: Chest x-ray performed, which reveals minimal opacification of the left retrocardiac region; sodium of 140, potassium 4.5, chloride 104, bicarbonate of 23, BUN 46, creatinine 8.16, glucose of 115, an albumin of 3.1; otherwise within normal limits, WBC of 18, hemoglobin 10.1, platelets of 207;   ASSESSMENT AND PLAN: An 81 year old African American gentleman with history of end-stage renal disease on hemodialysis Monday, Wednesday, Friday; went for routine hemodialysis and noted to have a malfunctioning arteriovenous fistula  1.  Left upper extremity arteriovenous fistula malfunction.  Admit under observational status, with consult vascular surgery; case already discussed with Dr. Delana Meyer of vascular surgery in the emergency department, by emergency department staffing.  2.  End-stage renal disease on hemodialysis, mild volume overload; however, no asymptomatic, no urgent need for hemodialysis at this time; we will consult nephrology for continuation of routine dialysis.  3.  Type 2 diabetes, insulin-requiring. Hold p.o. agents, add insulin sliding scale with q 6hour accu checks  4.  Seizure disorder, well controlled. Continue with Lamictal and gabapentin.    5.  Gastroesophageal reflux disease without esophagitis  proton pump inhibitor therapy.  6.  Venous thrombosis prophylaxis. Heparin subcutaneous.   Patient is FULL CODE.   TIME SPENT: 35 minutes.    ____________________________ Aaron Mose. Deitrick Ferreri, MD dkh:nt D: 07/22/2014 12:45:80 ET T: 07/22/2014 21:37:19 ET JOB#: [id number]  cc: Aaron Mose. Lisa Milian, MD, <Dictator> Kemyah Buser Woodfin Ganja MD ELECTRONICALLY SIGNED 07/23/2014 1:39

## 2014-10-04 ENCOUNTER — Emergency Department: Payer: Medicare Other

## 2014-10-04 ENCOUNTER — Encounter: Payer: Self-pay | Admitting: Emergency Medicine

## 2014-10-04 ENCOUNTER — Emergency Department
Admission: EM | Admit: 2014-10-04 | Discharge: 2014-10-04 | Disposition: A | Discharge status: Home / Self Care | Payer: Medicare Other | Attending: Emergency Medicine | Admitting: Emergency Medicine

## 2014-10-04 DIAGNOSIS — S59911A Unspecified injury of right forearm, initial encounter: Secondary | ICD-10-CM | POA: Diagnosis not present

## 2014-10-04 DIAGNOSIS — W19XXXA Unspecified fall, initial encounter: Secondary | ICD-10-CM

## 2014-10-04 DIAGNOSIS — W1839XA Other fall on same level, initial encounter: Secondary | ICD-10-CM | POA: Insufficient documentation

## 2014-10-04 DIAGNOSIS — Y998 Other external cause status: Secondary | ICD-10-CM | POA: Insufficient documentation

## 2014-10-04 DIAGNOSIS — S32502A Unspecified fracture of left pubis, initial encounter for closed fracture: Secondary | ICD-10-CM | POA: Diagnosis not present

## 2014-10-04 DIAGNOSIS — Z880 Allergy status to penicillin: Secondary | ICD-10-CM | POA: Diagnosis not present

## 2014-10-04 DIAGNOSIS — E119 Type 2 diabetes mellitus without complications: Secondary | ICD-10-CM | POA: Diagnosis not present

## 2014-10-04 DIAGNOSIS — Y9289 Other specified places as the place of occurrence of the external cause: Secondary | ICD-10-CM | POA: Diagnosis not present

## 2014-10-04 DIAGNOSIS — S32592A Other specified fracture of left pubis, initial encounter for closed fracture: Secondary | ICD-10-CM

## 2014-10-04 DIAGNOSIS — R52 Pain, unspecified: Secondary | ICD-10-CM

## 2014-10-04 DIAGNOSIS — I10 Essential (primary) hypertension: Secondary | ICD-10-CM | POA: Insufficient documentation

## 2014-10-04 DIAGNOSIS — S79912A Unspecified injury of left hip, initial encounter: Secondary | ICD-10-CM | POA: Diagnosis present

## 2014-10-04 DIAGNOSIS — Y9389 Activity, other specified: Secondary | ICD-10-CM | POA: Diagnosis not present

## 2014-10-04 HISTORY — DX: Malignant neoplasm of prostate: C61

## 2014-10-04 MED ORDER — TRAMADOL HCL 50 MG PO TABS: 50.0000 mg | ORAL_TABLET | Freq: Four times a day (QID) | ORAL | Status: DC | PRN

## 2014-10-04 MED ORDER — TRAMADOL HCL 50 MG PO TABS: 50.0000 mg | ORAL_TABLET | Freq: Four times a day (QID) | ORAL | Status: AC | PRN

## 2014-10-04 NOTE — ED Provider Notes (Signed)
[facility] Emergency Department Provider Note  ____________________________________________  Time seen: Approximately 5:15 PM  I have reviewed the triage vital signs and the nursing notes.   HISTORY  Chief Complaint Fall and Hip Pain    HPI RAMAR NOBREGA is a 81 y.o. male who presents with complaints of falling last weekly and on his left hip. History of hip fracture about 8 months ago. Complains of left hip pain, difficulty walking and right arm pain.   Past Medical History  Diagnosis Date  . Renal disorder   . Diabetes mellitus without complication   . Hypertension   . Prostate cancer   . CVA (cerebral infarction)     There are no active problems to display for this patient.   Past Surgical History  Procedure Laterality Date  . Total hip arthroplasty Left     Current Outpatient Rx  Name  Route  Sig  Dispense  Refill  . traMADol (ULTRAM) 50 MG tablet   Oral   Take 1 tablet (50 mg total) by mouth every 6 (six) hours as needed.   20 tablet   0   . traMADol (ULTRAM) 50 MG tablet   Oral   Take 1 tablet (50 mg total) by mouth every 6 (six) hours as needed.   20 tablet   0     Allergies Aspirin; Bc powder; and Penicillins  History reviewed. No pertinent family history.  Social History History  Substance Use Topics  . Smoking status: Never Smoker   . Smokeless tobacco: Never Used  . Alcohol Use: No    Review of Systems Constitutional: No fever/chills Eyes: No visual changes. ENT: No sore throat. Cardiovascular: Denies chest pain. Respiratory: Denies shortness of breath. Gastrointestinal: No abdominal pain.  No nausea, no vomiting.  No diarrhea.  No constipation. Genitourinary: Negative for dysuria. Musculoskeletal: Positive for left hip pain. Positive for right forearm pain. Skin: Negative for rash. Neurological: Negative for headaches, focal weakness or numbness.  10-point ROS otherwise  negative.  ____________________________________________   PHYSICAL EXAM:  VITAL SIGNS: ED Triage Vitals  Enc Vitals Group     BP 10/04/14 1613 136/72 mmHg     Pulse Rate 10/04/14 1613 98     Resp 10/04/14 1613 18     Temp 10/04/14 1613 98.5 F (36.9 C)     Temp Source 10/04/14 1613 Oral     SpO2 10/04/14 1613 98 %     Weight 10/04/14 1613 140 lb (63.504 kg)     Height 10/04/14 1613 5\' 6"  (1.676 m)     Head Cir --      Peak Flow --      Pain Score 10/04/14 1614 8     Pain Loc --      Pain Edu? --      Excl. in [place]? --     Constitutional: Alert and oriented. Well appearing and in no acute distress. Eyes: Conjunctivae are normal. PERRL. EOMI. Head: Atraumatic. Nose: No congestion/rhinnorhea. Mouth/Throat: Mucous membranes are moist.  Oropharynx non-erythematous. Neck: No stridor.   Cardiovascular: Normal rate, regular rhythm. Grossly normal heart sounds.  Good peripheral circulation. Respiratory: Normal respiratory effort.  No retractions. Lungs CTAB. Gastrointestinal: Soft and nontender. No distention. No abdominal bruits. No CVA tenderness. Musculoskeletal: No lower extremity tenderness nor edema.  No joint effusions.Patient sitting in wheelchair unable to stand up to get into bed. Right arm with minimal point tenderness. Full range of motion noted.  Neurologic:  Normal speech and  language. No gross focal neurologic deficits are appreciated. Speech is normal. No gait instability. Skin:  Skin is warm, dry and intact. No rash noted. Previous shunt noted in the right anterior forearm. Psychiatric: Mood and affect are normal. Speech and behavior are normal.  ____________________________________________   LABS (all labs ordered are listed, but only abnormal results are displayed)  Labs Reviewed - No data to display ____________________________________________  EKG  Not applicable ____________________________________________  RADIOLOGY  Interpreted by radiologist,  reviewed by myself. Patient has. Inferior left pubic rami fractures. ____________________________________________   PROCEDURES  Procedure(s) performed: None  Critical Care performed: No  ____________________________________________   INITIAL IMPRESSION / ASSESSMENT AND PLAN / ED COURSE  Pertinent labs & imaging results that were available during my care of the patient were reviewed by me and considered in my medical decision making (see chart for details).  Patient discharged back to the [facility] health center. Spoke with Oris Drone at the [facility] she will be in contact with the patient's primary care provider for admission orders to the rehabilitation center. All findings with patient and family and Dr. 4 block. To be discharged back home with she tramadol therapy. Rehabilitation instructions and discharge orders she provided for rehabilitation center. Patient voices no other emergency medical conditions at this visit. ____________________________________________   FINAL CLINICAL IMPRESSION(S) / ED DIAGNOSES  Final diagnoses:  Fall  Fracture of multiple pubic rami, left, closed, initial encounter      Arlyss Repress, PA-C 10/04/14 1838  Hinda Kehr, MD 10/04/14 2210

## 2014-10-04 NOTE — ED Notes (Signed)
Pt to ed with c/o falling last week.  Landed on left hip, hx of hip fx.  Pt states pain has increased and also c/o pain in right arm.

## 2014-10-04 NOTE — Discharge Instructions (Signed)
Stable Pelvic Fracture You have one or more fractures (this means there is a break in the bones) of the pelvis. The pelvis is the ring of bones that make up your hipbones. These are the bones you sit on and the lower part of the spine. It is like a boney ring where your legs attach and which supports your upper body. You have an undisplaced fracture. This means the bones are in good position. The pelvic fracture you have is a simple (uncomplicated) fracture. DIAGNOSIS  X-rays usually diagnose these fractures. TREATMENT  The goal of treating pelvic fractures is to get the bones to heal in a good position. The patient should return to normal activities as soon as possible. Such fractures are often treated with normal bed rest and conservative measures.  HOME CARE INSTRUCTIONS   You should be on bed rest for as long as directed by your caregiver. Change positions of your legs every 1-2 hours to maintain good blood flow. You may sit as long as is tolerable. Following this, you may do usual activities, but avoid strenuous activities for as long as directed by your caregiver.  Only take over-the-counter or prescription medicines for pain, discomfort, or fever as directed by your caregiver.  Bed rest may also be used for discomfort.  Resume your activities when you are able. Use a cane or crutch on the injured side to reduce pain while walking, as needed.  If you develop increased pain or discomfort not relieved with medications, contact your caregiver.  Warning: Do not drive a car or operate a motor vehicle until your caregiver specifically tells you it is safe to do so. SEEK IMMEDIATE MEDICAL CARE IF:   You feel light-headed or faint, develop chest pain or shortness of breath.  An unexplained oral temperature above 102 F (38.9 C) develops.  You develop blood in the urine or in the stools.  There is difficulty urinating, and/or having a bowel movement, or pain with these efforts.  There is a  difficulty or increased pain with walking.  There is swelling in one or both legs that is not normal. Document Released: 06/10/2001 Document Revised: 08/16/2013 Document Reviewed: 11/13/2007 [facility] Patient Information 2015 [place], [place]. This information is not intended to replace advice given to you by your health care provider. Make sure you discuss any questions you have with your health care provider.   1. PATIENT TO BE ADMITTED TO THE REHAB CENTER FOR DIAGNOSIS OF LEFT PELVIC RAMI FRACTURES, NONDISPLACED.  2. CONTACT PATIENTS HEATH CARE PROVIDER FOR ADMISSION ORDERS. 3. CONTINUE TRAMADOL 50MG  EVERY 4-6 HOURS FOR PAIN.  Channie Bostick M. Christorpher Hisaw, PA-C

## 2015-04-26 ENCOUNTER — Emergency Department
Admission: EM | Admit: 2015-04-26 | Discharge: 2015-04-26 | Disposition: A | Discharge status: Home / Self Care | Payer: Medicare Other | Attending: Emergency Medicine | Admitting: Emergency Medicine

## 2015-04-26 DIAGNOSIS — Z992 Dependence on renal dialysis: Secondary | ICD-10-CM | POA: Insufficient documentation

## 2015-04-26 DIAGNOSIS — I120 Hypertensive chronic kidney disease with stage 5 chronic kidney disease or end stage renal disease: Secondary | ICD-10-CM | POA: Insufficient documentation

## 2015-04-26 DIAGNOSIS — Z880 Allergy status to penicillin: Secondary | ICD-10-CM | POA: Insufficient documentation

## 2015-04-26 DIAGNOSIS — N186 End stage renal disease: Secondary | ICD-10-CM | POA: Insufficient documentation

## 2015-04-26 DIAGNOSIS — R55 Syncope and collapse: Secondary | ICD-10-CM | POA: Insufficient documentation

## 2015-04-26 DIAGNOSIS — E860 Dehydration: Secondary | ICD-10-CM | POA: Diagnosis not present

## 2015-04-26 DIAGNOSIS — E119 Type 2 diabetes mellitus without complications: Secondary | ICD-10-CM | POA: Diagnosis not present

## 2015-04-26 MED ORDER — SODIUM CHLORIDE 0.9 % IV BOLUS (SEPSIS)
500.0000 mL | Freq: Once | INTRAVENOUS | Status: AC
  Administered 2015-04-26: 500 mL via INTRAVENOUS

## 2015-04-26 NOTE — ED Provider Notes (Signed)
[facility] Emergency Department Provider Note   ____________________________________________  Time seen: Approximately 4:15 PM I have reviewed the triage vital signs and the triage nursing note.  HISTORY  Chief Complaint Hypotension   Historian Patient  HPI Michael Acosta is a 82 y.o. male who receives dialysis for end-stage renal disease, and after completing dialysis today as he is getting ready to leave patient states he had put his feet down in order to get up and leave and it was reported that he had a blank stare and when his blood pressure was checked it was 95/74 with heart rate of 110. Reportedly he was received a 1 L normal saline bolus before EMS arrived. Patient states that this is happened before and he thinks that it's "no big deal." Does not report being ill recently. No chest pain or palpitations. No headache, confusion, weakness or numbness. States he has had a seizure in the past, but does not take seizure medication for it. There is no report of seizure activity. Symptoms are reported to be mild.    Past Medical History  Diagnosis Date  . Renal disorder   . Diabetes mellitus without complication   . Hypertension   . Prostate cancer   . CVA (cerebral infarction)     There are no active problems to display for this patient.   Past Surgical History  Procedure Laterality Date  . Total hip arthroplasty Left     Current Outpatient Rx  Name  Route  Sig  Dispense  Refill  . traMADol (ULTRAM) 50 MG tablet   Oral   Take 1 tablet (50 mg total) by mouth every 6 (six) hours as needed.   20 tablet   0   . traMADol (ULTRAM) 50 MG tablet   Oral   Take 1 tablet (50 mg total) by mouth every 6 (six) hours as needed.   20 tablet   0     Allergies Aspirin; Bc powder; and Penicillins  No family history on file.  Social History Social History  Substance Use Topics  . Smoking status: Never Smoker   . Smokeless tobacco: Never Used  .  Alcohol Use: No    Review of Systems  Constitutional: Negative for fever. Eyes: Negative for visual changes. ENT: Negative for sore throat. Cardiovascular: Negative for chest pain. Respiratory: Negative for shortness of breath. Gastrointestinal: Negative for abdominal pain, vomiting and diarrhea. Genitourinary: Negative for dysuria. Musculoskeletal: Negative for back pain. Skin: Negative for rash. Neurological: Negative for headache. 10 point Review of Systems otherwise negative ____________________________________________   PHYSICAL EXAM:  VITAL SIGNS: ED Triage Vitals  Enc Vitals Group     BP --      Pulse --      Resp --      Temp 04/26/15 1618 98 F (36.7 C)     Temp src --      SpO2 --      Weight --      Height --      Head Cir --      Peak Flow --      Pain Score --      Pain Loc --      Pain Edu? --      Excl. in [place]? --      Constitutional: Alert and oriented. Well appearing and in no distress. Eyes: Conjunctivae are normal. PERRL. Normal extraocular movements. ENT   Head: Normocephalic and atraumatic.   Nose: No congestion/rhinnorhea.  Mouth/Throat: Mucous membranes are moist.   Neck: No stridor. Cardiovascular/Chest: Regular rate and rhythm. No murmurs, rubs, or gallops. Respiratory: Normal respiratory effort without tachypnea nor retractions. Breath sounds are clear and equal bilaterally. No wheezes/rales/rhonchi. Gastrointestinal: Soft. No distention, no guarding, no rebound. Nontender.    Genitourinary/rectal:Deferred Musculoskeletal: Nontender with normal range of motion in all extremities. No joint effusions.  No lower extremity tenderness.  No edema. Neurologic:  Normal speech and language. No gross or focal neurologic deficits are appreciated. Skin:  Skin is warm, dry and intact. No rash noted. Psychiatric: Mood and affect are normal. Speech and behavior are normal. Patient exhibits appropriate insight and  judgment.  ____________________________________________   EKG I, Lisa Roca, MD, the attending physician have personally viewed and interpreted all ECGs.  100 bpm. Sinus tachycardia. Narrow QRS. Normal axis. Normal ST and T-wave ____________________________________________  LABS (pertinent positives/negatives)  None  ____________________________________________  RADIOLOGY All Xrays were viewed by me. Imaging interpreted by Radiologist.  None __________________________________________  PROCEDURES  Procedure(s) performed: None  Critical Care performed: None  ____________________________________________   ED COURSE / ASSESSMENT AND PLAN  CONSULTATIONS: None  Pertinent labs & imaging results that were available during my care of the patient were reviewed by me and considered in my medical decision making (see chart for details).   History of sounds like the patient had near syncope due to hypovolemia after dialysis. Patient states this happened before. No additional symptoms to suggest stroke, acute respiratory syndrome, or seizure.  Orthostatics here in the emergency department, patient was initially slightly hypotensive with a normal heart rate, but upon standing his heart rate did go to 130 and was sustained. I think he is still hypovolemic. Patient was given IV fluid bolus here in the emergency department and recheck orthostatics.  ----------------------------------------- 6:30 PM on 04/26/2015 -----------------------------------------  Repeat orthostatics are improved.  Patient / Family / Caregiver informed of clinical course, medical decision-making process, and agree with plan.   I discussed return precautions, follow-up instructions, and discharged instructions with patient and/or family.  ___________________________________________   FINAL CLINICAL IMPRESSION(S) / ED DIAGNOSES   Final diagnoses:  Dehydration   Near  syncope           Note: This dictation was prepared with Dragon dictation. Any transcriptional errors that result from this process are unintentional   Lisa Roca, MD 04/26/15 1831

## 2015-04-26 NOTE — Discharge Instructions (Signed)
You were evaluated for nearly passing out and low blood pressure after dialysis. You were given IV fluids here in the emergency department and your blood pressure and heart rate are much better at this point in time.  Return to the emergency room if any worsening condition including fever, chest pain, trouble breathing, abdominal pain, or any other symptoms concerning to you including passing out or dizziness, any confusion or altered mental status.   Near-Syncope Near-syncope (commonly known as near fainting) is sudden weakness, dizziness, or feeling like you might pass out. During an episode of near-syncope, you may also develop pale skin, have tunnel vision, or feel sick to your stomach (nauseous). Near-syncope may occur when getting up after sitting or while standing for a long time. It is caused by a sudden decrease in blood flow to the brain. This decrease can result from various causes or triggers, most of which are not serious. However, because near-syncope can sometimes be a sign of something serious, a medical evaluation is required. The specific cause is often not determined. HOME CARE INSTRUCTIONS  Monitor your condition for any changes. The following actions may help to alleviate any discomfort you are experiencing:  Have someone stay with you until you feel stable.  Lie down right away and prop your feet up if you start feeling like you might faint. Breathe deeply and steadily. Wait until all the symptoms have passed. Most of these episodes last only a few minutes. You may feel tired for several hours.   Drink enough fluids to keep your urine clear or pale yellow.   If you are taking blood pressure or heart medicine, get up slowly when seated or lying down. Take several minutes to sit and then stand. This can reduce dizziness.  Follow up with your health care provider as directed. SEEK IMMEDIATE MEDICAL CARE IF:   You have a severe headache.   You have unusual pain in the  chest, abdomen, or back.   You are bleeding from the mouth or rectum, or you have black or tarry stool.   You have an irregular or very fast heartbeat.   You have repeated fainting or have seizure-like jerking during an episode.   You faint when sitting or lying down.   You have confusion.   You have difficulty walking.   You have severe weakness.   You have vision problems.  MAKE SURE YOU:   Understand these instructions.  Will watch your condition.  Will get help right away if you are not doing well or get worse.   This information is not intended to replace advice given to you by your health care provider. Make sure you discuss any questions you have with your health care provider.   Document Released: 04/01/2005 Document Revised: 04/06/2013 Document Reviewed: 09/04/2012 Elsevier Interactive Patient Education [employer].

## 2015-04-26 NOTE — ED Notes (Signed)
Per EMS, patient was at dialysis center today. He had finished his treatment and when the staff went to put his feet down he had a "blank stare", BP in left lower leg was 95/74, HR 110 ST. Staff gave patient 1L bolus before EMS arrived. Patient denies any pain and states, "They are making a big thing out of a little thing". Patient A&O x4.

## 2015-12-05 ENCOUNTER — Emergency Department
Admission: EM | Admit: 2015-12-05 | Discharge: 2015-12-06 | Disposition: A | Discharge status: Home / Self Care | Payer: Medicare Other | Attending: Emergency Medicine | Admitting: Emergency Medicine

## 2015-12-05 ENCOUNTER — Emergency Department: Payer: Medicare Other

## 2015-12-05 DIAGNOSIS — Z8546 Personal history of malignant neoplasm of prostate: Secondary | ICD-10-CM | POA: Diagnosis not present

## 2015-12-05 DIAGNOSIS — E119 Type 2 diabetes mellitus without complications: Secondary | ICD-10-CM | POA: Diagnosis not present

## 2015-12-05 DIAGNOSIS — I959 Hypotension, unspecified: Secondary | ICD-10-CM | POA: Diagnosis present

## 2015-12-05 LAB — COMPREHENSIVE METABOLIC PANEL
ALT: 22 U/L (ref 17–63)
AST: 22 U/L (ref 15–41)
Albumin: 3.3 g/dL — ABNORMAL LOW (ref 3.5–5.0)
Alkaline Phosphatase: 95 U/L (ref 38–126)
Anion gap: 14 (ref 5–15)
BILIRUBIN TOTAL: 0.3 mg/dL (ref 0.3–1.2)
BUN: 63 mg/dL — AB (ref 6–20)
CO2: 25 mmol/L (ref 22–32)
Calcium: 8.8 mg/dL — ABNORMAL LOW (ref 8.9–10.3)
Chloride: 100 mmol/L — ABNORMAL LOW (ref 101–111)
Creatinine, Ser: 7.82 mg/dL — ABNORMAL HIGH (ref 0.61–1.24)
GFR calc Af Amer: 6 mL/min — ABNORMAL LOW (ref >60)
GFR, EST NON AFRICAN AMERICAN: 6 mL/min — AB (ref >60)
Glucose, Bld: 141 mg/dL — ABNORMAL HIGH (ref 65–99)
POTASSIUM: 4.7 mmol/L (ref 3.5–5.1)
Sodium: 139 mmol/L (ref 135–145)
TOTAL PROTEIN: 7 g/dL (ref 6.5–8.1)

## 2015-12-05 LAB — CBC WITH DIFFERENTIAL/PLATELET
Basophils Absolute: 0 10*3/uL (ref 0–0.1)
Basophils Relative: 1 %
Eosinophils Absolute: 0.1 10*3/uL (ref 0–0.7)
Eosinophils Relative: 2 %
HEMATOCRIT: 33.9 % — AB (ref 40.0–52.0)
Hemoglobin: 11.3 g/dL — ABNORMAL LOW (ref 13.0–18.0)
LYMPHS ABS: 0.8 10*3/uL — AB (ref 1.0–3.6)
LYMPHS PCT: 16 %
MCH: 32.5 pg (ref 26.0–34.0)
MCHC: 33.2 g/dL (ref 32.0–36.0)
MCV: 97.6 fL (ref 80.0–100.0)
MONO ABS: 0.7 10*3/uL (ref 0.2–1.0)
MONOS PCT: 14 %
NEUTROS ABS: 3.6 10*3/uL (ref 1.4–6.5)
Neutrophils Relative %: 67 %
Platelets: 186 10*3/uL (ref 150–440)
RBC: 3.48 MIL/uL — ABNORMAL LOW (ref 4.40–5.90)
RDW: 14.6 % — AB (ref 11.5–14.5)
WBC: 5.3 10*3/uL (ref 3.8–10.6)

## 2015-12-05 LAB — TROPONIN I: TROPONIN I: 0.07 ng/mL — AB (ref <0.03)

## 2015-12-05 MED ORDER — SODIUM CHLORIDE 0.9 % IV SOLN
Freq: Once | INTRAVENOUS | Status: AC
  Administered 2015-12-05: 21:00:00 via INTRAVENOUS

## 2015-12-05 MED ORDER — SODIUM CHLORIDE 0.9 % IV SOLN
Freq: Once | INTRAVENOUS | Status: AC
  Administered 2015-12-05: 23:00:00 via INTRAVENOUS

## 2015-12-05 MED ORDER — MIDODRINE HCL 5 MG PO TABS
5.0000 mg | ORAL_TABLET | Freq: Once | ORAL | Status: AC
  Administered 2015-12-05: 5 mg via ORAL
  Filled 2015-12-05: qty 1

## 2015-12-05 NOTE — ED Notes (Signed)
Lab called critical troponin of 0.07 - reported to Dr Edd Fabian

## 2015-12-05 NOTE — ED Provider Notes (Addendum)
[facility] Emergency Department Provider Note   ____________________________________________   First MD Initiated Contact with Patient 12/05/15 2054     (approximate)  I have reviewed the triage vital signs and the nursing notes.   HISTORY  Chief Complaint Hypotension    HPI Michael Acosta is a 82 y.o. male with end-stage renal disease dialysis dependent typically dialyzed Monday Wednesday Friday, last fully dialyzed yesterday, diabetes insulin-requiring, hypertension, history of CVA, as well as mild dementia who presents from [facility] for evaluation of hypotension noted today at 5 PM during routine vital sign check, constant since onset, severe, not improved when they tried to by mouth fluid challenge him. They were also concerned because he briefly became hypoxic at 8:30 pm and so they called 911. Patient reports that he feels fine. He has not been ill, he denies any vomiting, diarrhea, fevers or chills. He denies any abdominal pain, chest pain, no difficulty breathing.   Past Medical History:  Diagnosis Date  . CVA (cerebral infarction)   . Diabetes mellitus without complication ([place])   . Hypertension   . Prostate cancer ([place])   . Renal disorder     There are no active problems to display for this patient.   Past Surgical History:  Procedure Laterality Date  . TOTAL HIP ARTHROPLASTY Left     Prior to Admission medications   Medication Sig Start Date End Date Taking? Authorizing Provider  traMADol (ULTRAM) 50 MG tablet Take 1 tablet (50 mg total) by mouth every 6 (six) hours as needed. 10/04/14   Arlyss Repress, PA-C    Allergies Aspirin; Bc powder [aspirin-salicylamide-caffeine]; and Penicillins  No family history on file.  Social History Social History  Substance Use Topics  . Smoking status: Never Smoker  . Smokeless tobacco: Never Used  . Alcohol use No    Review of Systems Constitutional: No fever/chills Eyes:  No visual changes. ENT: No sore throat. Cardiovascular: Denies chest pain. Respiratory: Denies shortness of breath. Gastrointestinal: No abdominal pain.  No nausea, no vomiting.  No diarrhea.  No constipation. Genitourinary: Negative for dysuria. Musculoskeletal: Negative for back pain. Skin: Negative for rash. Neurological: Negative for headaches, focal weakness or numbness.  10-point ROS otherwise negative.  ____________________________________________   PHYSICAL EXAM:  Vitals:   12/05/15 2300 12/05/15 2303 12/05/15 2315 12/05/15 2330  BP: (!) 117/52 (!) 110/59 (!) 127/50 (!) 107/35  Pulse:      Resp: 19 18 (!) 21 16  Temp:      TempSrc:      SpO2:      Weight:      Height:        VITAL SIGNS: ED Triage Vitals  Enc Vitals Group     BP 12/05/15 2100 (!) 95/29     Pulse Rate 12/05/15 2100 74     Resp 12/05/15 2100 15     Temp 12/05/15 2100 98.3 F (36.8 C)     Temp Source 12/05/15 2100 Oral     SpO2 12/05/15 2056 95 %     Weight 12/05/15 2101 140 lb (63.5 kg)     Height 12/05/15 2101 5\' 5"  (1.651 m)     Head Circumference --      Peak Flow --      Pain Score 12/05/15 2101 0     Pain Loc --      Pain Edu? --      Excl. in [place]? --     Constitutional:  Alert and oriented x 4. Well appearing and in no acute distress. Eyes: Conjunctivae are normal. PERRL. EOMI. Head: Atraumatic. Nose: No congestion/rhinnorhea. Mouth/Throat: Mucous membranes are moist.  Oropharynx non-erythematous. Neck: No stridor.  Supple without meningismus. Cardiovascular: Normal rate, regular rhythm. Grossly normal heart sounds.  Good peripheral circulation. Respiratory: Normal respiratory effort.  No retractions. Lungs CTAB. Gastrointestinal: Soft and nontender. No distention.  No CVA tenderness. Genitourinary: deferred Musculoskeletal: No lower extremity tenderness nor edema.  No joint effusions. Neurologic:  Normal speech and language. No gross focal neurologic deficits are appreciated. No  gait instability. Skin:  Skin is warm, dry and intact. No rash noted. Psychiatric: Mood and affect are normal. Speech and behavior are normal.  ____________________________________________   LABS (all labs ordered are listed, but only abnormal results are displayed)  Labs Reviewed  CBC WITH DIFFERENTIAL/PLATELET - Abnormal; Notable for the following:       Result Value   RBC 3.48 (*)    Hemoglobin 11.3 (*)    HCT 33.9 (*)    RDW 14.6 (*)    Lymphs Abs 0.8 (*)    All other components within normal limits  COMPREHENSIVE METABOLIC PANEL - Abnormal; Notable for the following:    Chloride 100 (*)    Glucose, Bld 141 (*)    BUN 63 (*)    Creatinine, Ser 7.82 (*)    Calcium 8.8 (*)    Albumin 3.3 (*)    GFR calc non Af Amer 6 (*)    GFR calc Af Amer 6 (*)    All other components within normal limits  TROPONIN I - Abnormal; Notable for the following:    Troponin I 0.07 (*)    All other components within normal limits   ____________________________________________  EKG  ED ECG REPORT I, Joanne Gavel, the attending physician, personally viewed and interpreted this ECG.   Date: 12/05/2015  EKG Time: 21:06  Rate: 77  Rhythm: normal sinus rhythm  Axis: normal  Intervals:none  ST&T Change: No acute ST elevation or acute ST depression. Chronic T-wave inversions in 1 and aVL.  ____________________________________________  RADIOLOGY  CXR IMPRESSION: No acute cardiopulmonary process.  Elevation left hemidiaphragm.  Bibasilar linear opacities likely represent atelectasis or scarring. ____________________________________________   PROCEDURES  Procedure(s) performed: None  Procedures  Critical Care performed: No  ____________________________________________   INITIAL IMPRESSION / ASSESSMENT AND PLAN / ED COURSE  Pertinent labs & imaging results that were available during my care of the patient were reviewed by me and considered in my medical decision making (see  chart for details).  Michael Acosta is a 82 y.o. male with end-stage renal disease dialysis dependent typically dialyzed Monday Wednesday Friday, last fully dialyzed yesterday, diabetes insulin-requiring, hypertension, history of CVA, as well as mild dementia who presents from [facility] for evaluation of hypotension noted today at 5 PM during routine vital sign check. On arrival to the emergency department he is generally well-appearing and in no acute distress. He is awake, alert, oriented, mentating appropriately, pleasant and interactive. Initial vital signs show blood pressure of 95/29 and he does not have any oxygen requirement here, is not complaining of any shortness of breath or chest pain. His blood pressure fluctuates down to 72/27, we'll give light IV fluids and reassess. I discussed his care with staff at [facility] who reports to me that previously his blood pressures have been 91/60 as well as 101/59 so I don't think that this is too far  from his baseline given that he had dialysis yesterday. We'll obtain EKG, screening labs as well as chest x-ray. We'll give Midodrine. Reassess for disposition.  ----------------------------------------- 11:33 PM on 12/05/2015 ----------------------------------------- Patient is sitting up in bed eating a sandwich, still asymptomatic without any complaints. After 1200 mls of NS and midodrine, his blood pressure stabilized to 107/35 and he reports he feels well. EKG is unchanged from prior. He has never required any oxygen since arrival to the emergency department. Labs show creatinine elevation as expected in the setting of end-stage renal disease, normal potassium. Chronic mild anemia, hemoglobin 11.3. Troponin is mildly elevated 0.07 but he appears to have a chronic troponin elevation on chart review and this is improved from prior, likely related to his kidney disease. Chest x-ray with no acute cardiopulmonary disease. DC with return  precautions and close PCP follow-up. The patient is very comfortable with the discharge plan and he wants to go home. He'll follow-up for dialysis tomorrow morning. He denies any chest pain or shortness of breath at the time of discharge. I reassessed and he has clear breath sounds, no hypoxia, no increased work of breathing.   Clinical Course     ____________________________________________   FINAL CLINICAL IMPRESSION(S) / ED DIAGNOSES  Final diagnoses:  Hypotension, unspecified hypotension type      NEW MEDICATIONS STARTED DURING THIS VISIT:  New Prescriptions   No medications on file     Note:  This document was prepared using Dragon voice recognition software and may include unintentional dictation errors.    Joanne Gavel, MD 12/05/15 [id number]    Joanne Gavel, MD 12/05/15 [phone]

## 2015-12-05 NOTE — ED Notes (Addendum)
Pt given warm blanket - pt given [place] tray and coke - per Dr Edd Fabian order for NACL changed to 250cc instead of 500cc

## 2015-12-05 NOTE — ED Notes (Signed)
Dr Edd Fabian is aware of low BP's - she stated to make sure fluids are on pressure bag (and they are on pressure bag) - also aware that I am awaiting medication to arrive from pharmacy

## 2015-12-05 NOTE — ED Notes (Signed)
Pharmacy called to send Midodrine

## 2015-12-05 NOTE — ED Notes (Signed)
Pt is A&O x3 - remains in trendelenburg for low BP - Dr Edd Fabian aware

## 2015-12-05 NOTE — ED Notes (Signed)
Pt arrived via ems - c/o hypotension per nursing home since 5pm - at 830pm o2 sats dropped to 70's so ems was called - no c/o pain - pt had dialysis yesterday - MD at bedside

## 2015-12-05 NOTE — ED Notes (Signed)
Pt placed in trendelenburg for decreased BP's

## 2015-12-05 NOTE — ED Triage Notes (Signed)
Pt arrived via ems - c/o hypotension per nursing home since 5pm - at 830pm o2 sats dropped to 70's so ems was called - no c/o pain - pt had dialysis yesterday

## 2015-12-05 NOTE — Discharge Instructions (Signed)
Please attend your normal dialysis treatment in the morning. Return to the emergency department if you develop any chest pain, difficulty breathing, vomiting, fevers, chills or any new concerns.

## 2015-12-06 NOTE — ED Notes (Signed)
Secretary notified of need to call [facility] for transport back to [facility]

## 2015-12-12 ENCOUNTER — Inpatient Hospital Stay
Admission: RE | Admit: 2015-12-12 | Discharge: 2015-12-12 | Disposition: A | Discharge status: Home / Self Care | Payer: Medicare Other | Source: Ambulatory Visit

## 2015-12-12 NOTE — Progress Notes (Signed)
ANESTHESIA CONSULT NOTE:      I had the pleasure of speaking with Michael Acosta today.  Michael Acosta is an 82 year old black male with a Hx of 3 CVAs in the past ( last one seven years ago ), ESRD with dialysis on M,W and F, HTN,bradycardia Hx, seizures, GERD and neuropathy who presents today for preop check for his upcoming cataract repair.      I believe that Mr. Busto is a higher risk patient, but I believe that he can safely undergo his cataract procedure with a gentle anesthetic technique .  I do recommend obtaining a K+ the am of surgery and his operation is contingent on clearance from his primary care doctor.                                                        Thank you,  Valaria Good  MD

## 2015-12-12 NOTE — Pre-Procedure Instructions (Signed)
Cleared by pcp 

## 2015-12-13 ENCOUNTER — Encounter: Payer: Self-pay | Admitting: *Deleted

## 2015-12-20 ENCOUNTER — Ambulatory Visit: Admission: RE | Admit: 2015-12-20 | Payer: Medicare Other | Source: Ambulatory Visit | Admitting: Ophthalmology

## 2015-12-20 ENCOUNTER — Encounter: Admission: RE | Payer: Self-pay | Source: Ambulatory Visit

## 2015-12-20 HISTORY — DX: Weakness: R53.1

## 2015-12-20 HISTORY — DX: Unspecified convulsions: R56.9

## 2015-12-20 HISTORY — DX: Peripheral vascular disease, unspecified: I73.9

## 2015-12-20 HISTORY — DX: Depression, unspecified: F32.A

## 2015-12-20 HISTORY — DX: Cerebral infarction, unspecified: I63.9

## 2015-12-20 HISTORY — DX: Repeated falls: R29.6

## 2015-12-20 HISTORY — DX: Unspecified mood (affective) disorder: F39

## 2015-12-20 HISTORY — DX: Gastro-esophageal reflux disease without esophagitis: K21.9

## 2015-12-20 HISTORY — DX: Major depressive disorder, single episode, unspecified: F32.9

## 2015-12-20 HISTORY — DX: Unspecified dementia, unspecified severity, without behavioral disturbance, psychotic disturbance, mood disturbance, and anxiety: F03.90

## 2015-12-20 SURGERY — PHACOEMULSIFICATION, CATARACT, WITH IOL INSERTION
Anesthesia: Choice | Laterality: Right

## 2016-04-12 ENCOUNTER — Emergency Department: Payer: Medicare Other

## 2016-04-12 ENCOUNTER — Inpatient Hospital Stay
Admission: EM | Admit: 2016-04-12 | Discharge: 2016-04-18 | DRG: 871 | Disposition: A | Discharge status: Home / Self Care | Payer: Medicare Other | Attending: Internal Medicine | Admitting: Internal Medicine

## 2016-04-12 DIAGNOSIS — I9589 Other hypotension: Secondary | ICD-10-CM | POA: Diagnosis present

## 2016-04-12 DIAGNOSIS — D631 Anemia in chronic kidney disease: Secondary | ICD-10-CM | POA: Diagnosis present

## 2016-04-12 DIAGNOSIS — Z8673 Personal history of transient ischemic attack (TIA), and cerebral infarction without residual deficits: Secondary | ICD-10-CM

## 2016-04-12 DIAGNOSIS — N2581 Secondary hyperparathyroidism of renal origin: Secondary | ICD-10-CM | POA: Diagnosis present

## 2016-04-12 DIAGNOSIS — Z886 Allergy status to analgesic agent status: Secondary | ICD-10-CM

## 2016-04-12 DIAGNOSIS — Z79899 Other long term (current) drug therapy: Secondary | ICD-10-CM

## 2016-04-12 DIAGNOSIS — R748 Abnormal levels of other serum enzymes: Secondary | ICD-10-CM | POA: Diagnosis present

## 2016-04-12 DIAGNOSIS — G40909 Epilepsy, unspecified, not intractable, without status epilepticus: Secondary | ICD-10-CM | POA: Diagnosis present

## 2016-04-12 DIAGNOSIS — K219 Gastro-esophageal reflux disease without esophagitis: Secondary | ICD-10-CM | POA: Diagnosis present

## 2016-04-12 DIAGNOSIS — I953 Hypotension of hemodialysis: Secondary | ICD-10-CM | POA: Diagnosis present

## 2016-04-12 DIAGNOSIS — R6521 Severe sepsis with septic shock: Secondary | ICD-10-CM | POA: Diagnosis present

## 2016-04-12 DIAGNOSIS — E1142 Type 2 diabetes mellitus with diabetic polyneuropathy: Secondary | ICD-10-CM | POA: Diagnosis present

## 2016-04-12 DIAGNOSIS — Z794 Long term (current) use of insulin: Secondary | ICD-10-CM

## 2016-04-12 DIAGNOSIS — E1151 Type 2 diabetes mellitus with diabetic peripheral angiopathy without gangrene: Secondary | ICD-10-CM | POA: Diagnosis present

## 2016-04-12 DIAGNOSIS — N186 End stage renal disease: Secondary | ICD-10-CM | POA: Diagnosis not present

## 2016-04-12 DIAGNOSIS — F329 Major depressive disorder, single episode, unspecified: Secondary | ICD-10-CM | POA: Diagnosis present

## 2016-04-12 DIAGNOSIS — N179 Acute kidney failure, unspecified: Secondary | ICD-10-CM | POA: Diagnosis present

## 2016-04-12 DIAGNOSIS — Z8546 Personal history of malignant neoplasm of prostate: Secondary | ICD-10-CM

## 2016-04-12 DIAGNOSIS — Z96642 Presence of left artificial hip joint: Secondary | ICD-10-CM | POA: Diagnosis present

## 2016-04-12 DIAGNOSIS — A4159 Other Gram-negative sepsis: Secondary | ICD-10-CM | POA: Diagnosis not present

## 2016-04-12 DIAGNOSIS — E1122 Type 2 diabetes mellitus with diabetic chronic kidney disease: Secondary | ICD-10-CM | POA: Diagnosis present

## 2016-04-12 DIAGNOSIS — A411 Sepsis due to other specified staphylococcus: Secondary | ICD-10-CM | POA: Diagnosis present

## 2016-04-12 DIAGNOSIS — I5032 Chronic diastolic (congestive) heart failure: Secondary | ICD-10-CM | POA: Diagnosis present

## 2016-04-12 DIAGNOSIS — Z992 Dependence on renal dialysis: Secondary | ICD-10-CM

## 2016-04-12 DIAGNOSIS — F0150 Vascular dementia without behavioral disturbance: Secondary | ICD-10-CM | POA: Diagnosis present

## 2016-04-12 DIAGNOSIS — E1140 Type 2 diabetes mellitus with diabetic neuropathy, unspecified: Secondary | ICD-10-CM | POA: Diagnosis present

## 2016-04-12 DIAGNOSIS — E785 Hyperlipidemia, unspecified: Secondary | ICD-10-CM | POA: Diagnosis present

## 2016-04-12 DIAGNOSIS — A419 Sepsis, unspecified organism: Secondary | ICD-10-CM

## 2016-04-12 DIAGNOSIS — Z880 Allergy status to penicillin: Secondary | ICD-10-CM

## 2016-04-12 DIAGNOSIS — I959 Hypotension, unspecified: Secondary | ICD-10-CM

## 2016-04-12 DIAGNOSIS — J189 Pneumonia, unspecified organism: Secondary | ICD-10-CM | POA: Diagnosis present

## 2016-04-12 DIAGNOSIS — R0902 Hypoxemia: Secondary | ICD-10-CM | POA: Diagnosis present

## 2016-04-12 DIAGNOSIS — I120 Hypertensive chronic kidney disease with stage 5 chronic kidney disease or end stage renal disease: Secondary | ICD-10-CM | POA: Diagnosis present

## 2016-04-12 LAB — CBC WITH DIFFERENTIAL/PLATELET
BASOS ABS: 0 10*3/uL (ref 0–0.1)
BASOS PCT: 0 %
EOS ABS: 0 10*3/uL (ref 0–0.7)
EOS PCT: 0 %
HCT: 34.2 % — ABNORMAL LOW (ref 40.0–52.0)
Hemoglobin: 11.2 g/dL — ABNORMAL LOW (ref 13.0–18.0)
Lymphocytes Relative: 1 %
Lymphs Abs: 0.2 10*3/uL — ABNORMAL LOW (ref 1.0–3.6)
MCH: 30.8 pg (ref 26.0–34.0)
MCHC: 32.6 g/dL (ref 32.0–36.0)
MCV: 94.7 fL (ref 80.0–100.0)
MONO ABS: 1.3 10*3/uL — AB (ref 0.2–1.0)
Monocytes Relative: 7 %
Neutro Abs: 17.9 10*3/uL — ABNORMAL HIGH (ref 1.4–6.5)
Neutrophils Relative %: 92 %
PLATELETS: 192 10*3/uL (ref 150–440)
RBC: 3.62 MIL/uL — ABNORMAL LOW (ref 4.40–5.90)
RDW: 14.9 % — AB (ref 11.5–14.5)
WBC: 19.5 10*3/uL — ABNORMAL HIGH (ref 3.8–10.6)

## 2016-04-12 LAB — COMPREHENSIVE METABOLIC PANEL
ALBUMIN: 3.5 g/dL (ref 3.5–5.0)
ALT: 70 U/L — ABNORMAL HIGH (ref 17–63)
AST: 66 U/L — AB (ref 15–41)
Alkaline Phosphatase: 142 U/L — ABNORMAL HIGH (ref 38–126)
Anion gap: 16 — ABNORMAL HIGH (ref 5–15)
BUN: 45 mg/dL — AB (ref 6–20)
CHLORIDE: 98 mmol/L — AB (ref 101–111)
CO2: 25 mmol/L (ref 22–32)
Calcium: 8.5 mg/dL — ABNORMAL LOW (ref 8.9–10.3)
Creatinine, Ser: 6.97 mg/dL — ABNORMAL HIGH (ref 0.61–1.24)
GFR calc Af Amer: 7 mL/min — ABNORMAL LOW (ref >60)
GFR calc non Af Amer: 6 mL/min — ABNORMAL LOW (ref >60)
GLUCOSE: 161 mg/dL — AB (ref 65–99)
POTASSIUM: 4.4 mmol/L (ref 3.5–5.1)
Sodium: 139 mmol/L (ref 135–145)
Total Bilirubin: 1.2 mg/dL (ref 0.3–1.2)
Total Protein: 7.5 g/dL (ref 6.5–8.1)

## 2016-04-12 LAB — PROTIME-INR
INR: 1.06
Prothrombin Time: 13.8 seconds (ref 11.4–15.2)

## 2016-04-12 LAB — LIPASE, BLOOD: Lipase: 34 U/L (ref 11–51)

## 2016-04-12 LAB — LACTIC ACID, PLASMA: Lactic Acid, Venous: 1.7 mmol/L (ref 0.5–1.9)

## 2016-04-12 LAB — INFLUENZA PANEL BY PCR (TYPE A & B)
INFLAPCR: NEGATIVE
INFLBPCR: NEGATIVE

## 2016-04-12 LAB — APTT: APTT: 33 s (ref 24–36)

## 2016-04-12 LAB — TROPONIN I: TROPONIN I: 0.08 ng/mL — AB (ref <0.03)

## 2016-04-12 MED ORDER — DEXTROSE 5 % IV SOLN
2.0000 g | Freq: Once | INTRAVENOUS | Status: AC
  Administered 2016-04-13: 2 g via INTRAVENOUS
  Filled 2016-04-12: qty 2

## 2016-04-12 MED ORDER — LEVOFLOXACIN IN D5W 750 MG/150ML IV SOLN
750.0000 mg | Freq: Once | INTRAVENOUS | Status: AC
  Administered 2016-04-12: 750 mg via INTRAVENOUS
  Filled 2016-04-12: qty 150

## 2016-04-12 MED ORDER — ACETAMINOPHEN 650 MG RE SUPP
RECTAL | Status: AC
  Filled 2016-04-12: qty 1

## 2016-04-12 MED ORDER — SODIUM CHLORIDE 0.9 % IV BOLUS (SEPSIS)
1000.0000 mL | Freq: Once | INTRAVENOUS | Status: AC
  Administered 2016-04-12: 1000 mL via INTRAVENOUS

## 2016-04-12 MED ORDER — VANCOMYCIN HCL IN DEXTROSE 1-5 GM/200ML-% IV SOLN: 1000.0000 mg | Freq: Once | INTRAVENOUS | Status: DC

## 2016-04-12 MED ORDER — ACETAMINOPHEN 650 MG RE SUPP
650.0000 mg | Freq: Once | RECTAL | Status: AC
  Administered 2016-04-12: 650 mg via RECTAL

## 2016-04-12 MED ORDER — SODIUM CHLORIDE 0.9 % IV BOLUS (SEPSIS)
250.0000 mL | Freq: Once | INTRAVENOUS | Status: AC
  Administered 2016-04-12: 250 mL via INTRAVENOUS

## 2016-04-12 NOTE — ED Notes (Signed)
Right sided femoral central line insertion complete. md states to please start using line pending abd film. All three lines have positive non pulsitile blood return and flush easily.

## 2016-04-12 NOTE — ED Notes (Signed)
Attempt x1 unsuccessfully to right ij central line. md looking at femoral at this time with ultrasound.

## 2016-04-12 NOTE — ED Provider Notes (Signed)
[facility] Emergency Department Provider Note  ____________________________________________   First MD Initiated Contact with Patient 04/12/16 2124     (approximate)  I have reviewed the triage vital signs and the nursing notes.   HISTORY  Chief Complaint Hypotension and Altered Mental Status  Level 5 caveat:  history limited by critical illness  HPI Michael Acosta is a 82 y.o. male with extensive chronic medical problems including end-stage renal disease on hemodialysis who presents appearing critically ill with hypotension with a systolic blood pressure in the 80s, hypoxemia with a room air saturation of 70% currently on the nonrebreather, tachypnea at 30, tachycardia of 112, and a fever of 101.7.  Reportedly he went to dialysis today and had a full course of treatment and then was sent over here for further evaluation of fever and general malaise.  He denies any specific symptoms but is also critically ill and difficult to understand.  No additional information is available about how long he has been feeling ill.   Past Medical History:  Diagnosis Date  . Anemia   . CVA (cerebral infarction)   . Dementia   . Depression   . Diabetes mellitus without complication ([place])   . Dialysis patient ([place])   . Dysrhythmia    BRADYCARDIA  . Falls frequently   . GERD (gastroesophageal reflux disease)   . Hypertension   . Mood disorder ([place])   . Neuropathy ([place])   . Pain    CHRONIC  . Peripheral vascular disease ([place])   . Prostate cancer ([place])   . Renal disorder    ESRD  . Seizures ([place])    EPILEPSY  . Stroke [facility])    TIA  . Weakness generalized     Patient Active Problem List   Diagnosis Date Noted  . CAP (community acquired pneumonia) 04/13/2016    Past Surgical History:  Procedure Laterality Date  . COLON SURGERY     RESECTION  . INSERTION OF DIALYSIS CATHETER     SHUNT  . LEG SURGERY Left    MULTIPLE VEIN SURGERIES  . TOTAL HIP  ARTHROPLASTY Left     Prior to Admission medications   Medication Sig Start Date End Date Taking? Authorizing Provider  atorvastatin (LIPITOR) 20 MG tablet Take 20 mg by mouth daily.   Yes Historical Provider, MD  docusate sodium (COLACE) 100 MG capsule Take 100 mg by mouth 2 (two) times daily as needed for mild constipation.   Yes Historical Provider, MD  gabapentin (NEURONTIN) 100 MG capsule Take 100 mg by mouth at bedtime.   Yes Historical Provider, MD  guaifenesin (ROBITUSSIN) 100 MG/5ML syrup Take 200 mg by mouth 4 (four) times daily as needed for cough.   Yes Historical Provider, MD  HYDROcodone-acetaminophen (NORCO/VICODIN) 5-325 MG tablet Take 1 tablet by mouth every 6 (six) hours as needed for moderate pain or severe pain.    Yes Historical Provider, MD  insulin aspart (NOVOLOG) 100 UNIT/ML injection Inject into the skin 4 (four) times daily -  before meals and at bedtime. SLIDING SCALE   Yes Historical Provider, MD  Insulin Detemir (LEVEMIR FLEXPEN) 100 UNIT/ML Pen Inject 15 Units into the skin at bedtime.    Yes Historical Provider, MD  lamoTRIgine (LAMICTAL) 100 MG tablet Take 100 mg by mouth at bedtime.   Yes Historical Provider, MD  levETIRAcetam (KEPPRA) 500 MG tablet Take 500 mg by mouth daily.   Yes Historical Provider, MD  omeprazole (PRILOSEC) 20 MG capsule Take  20 mg by mouth daily.    Yes Historical Provider, MD  ondansetron (ZOFRAN) 4 MG tablet Take 4 mg by mouth every 8 (eight) hours as needed for nausea or vomiting.    Yes Historical Provider, MD  sevelamer carbonate (RENVELA) 800 MG tablet Take 800 mg by mouth 3 (three) times daily with meals.   Yes Historical Provider, MD  sitaGLIPtin (JANUVIA) 25 MG tablet Take 25 mg by mouth daily.   Yes Historical Provider, MD  vitamin C (ASCORBIC ACID) 500 MG tablet Take 500 mg by mouth daily.   Yes Historical Provider, MD    Allergies Aspirin; Bc powder [aspirin-salicylamide-caffeine]; and Penicillins  No family history on  file.  Social History Social History  Substance Use Topics  . Smoking status: Never Smoker  . Smokeless tobacco: Never Used  . Alcohol use No    Review of Systems Unable to obtain due to critical illness (level V caveat)  ____________________________________________   PHYSICAL EXAM:  VITAL SIGNS: ED Triage Vitals  Enc Vitals Group     BP 04/12/16 2100 (!) 90/35     Pulse Rate 04/12/16 2109 (!) 112     Resp 04/12/16 2109 (!) 30     Temp 04/12/16 2109 (!) 101.7 F (38.7 C)     Temp Source 04/12/16 2109 Oral     SpO2 04/12/16 2109 100 %     Weight 04/12/16 2109 160 lb (72.6 kg)     Height 04/12/16 2109 5\' 8"  (1.727 m)     Head Circumference --      Peak Flow --      Pain Score --      Pain Loc --      Pain Edu? --      Excl. in [place]? --     Constitutional: Alert And responsive to commands.  Ill appearing. Eyes: Conjunctivae are normal. PERRL. EOMI. Head: Atraumatic. Nose: No congestion/rhinnorhea. Mouth/Throat: Mucous membranes are dry.Neck: No stridor.  No meningeal signs, able to flex and extend as well as rotate side to side without any tenderness, pain, or mobility restriction. Cardiovascular: Tachycardia, regular rhythm.  Cool extremities. Grossly normal heart sounds.  Patient has dialysis fistulas with thrills in bilateral upper extremities Respiratory: Increased respiratory effort and tachypnea, currently on NRB.  No retractions. Lungs CTAB. Gastrointestinal: Soft and nontender. No distention.  Musculoskeletal: No lower extremity tenderness nor edema. No gross deformities of extremities.  Bilateral dialysis fistulas and upper extremities with palpable thrills Neurologic:  Slurred speech and language, but uncertain of his baseline.  Moving all 4 extremities.  No facial droop noted Skin:  Skin is cool in the extremities, dry and intact. No rash noted.   ____________________________________________   LABS (all labs ordered are listed, but only abnormal results are  displayed)  Labs Reviewed  CBC WITH DIFFERENTIAL/PLATELET - Abnormal; Notable for the following:       Result Value   WBC 19.5 (*)    RBC 3.62 (*)    Hemoglobin 11.2 (*)    HCT 34.2 (*)    RDW 14.9 (*)    Neutro Abs 17.9 (*)    Lymphs Abs 0.2 (*)    Monocytes Absolute 1.3 (*)    All other components within normal limits  COMPREHENSIVE METABOLIC PANEL - Abnormal; Notable for the following:    Chloride 98 (*)    Glucose, Bld 161 (*)    BUN 45 (*)    Creatinine, Ser 6.97 (*)    Calcium 8.5 (*)  AST 66 (*)    ALT 70 (*)    Alkaline Phosphatase 142 (*)    GFR calc non Af Amer 6 (*)    GFR calc Af Amer 7 (*)    Anion gap 16 (*)    All other components within normal limits  TROPONIN I - Abnormal; Notable for the following:    Troponin I 0.08 (*)    All other components within normal limits  CULTURE, BLOOD (ROUTINE X 2)  CULTURE, BLOOD (ROUTINE X 2)  URINE CULTURE  LACTIC ACID, PLASMA  LIPASE, BLOOD  APTT  PROTIME-INR  INFLUENZA PANEL BY PCR (TYPE A & B, H1N1)  URINALYSIS, COMPLETE (UACMP) WITH MICROSCOPIC  CBC  MAGNESIUM  BASIC METABOLIC PANEL   ____________________________________________  EKG  ED ECG REPORT I, Davidjames Blansett, the attending physician, personally viewed and interpreted this ECG.  Date: 04/13/2016 EKG Time: 21:06 Rate: 110 Rhythm: sinus tachycardia QRS Axis: normal Intervals: normal ST/T Wave abnormalities: Non-specific ST segment / T-wave changes, but no evidence of acute ischemia. Conduction Disturbances: none Narrative Interpretation: unremarkable  ____________________________________________  RADIOLOGY   Dg Chest 1 View  Result Date: 04/12/2016 CLINICAL DATA:  Hypoxia, cough and fever. EXAM: CHEST 1 VIEW COMPARISON:  12/05/2015 FINDINGS: Mild cardiomegaly with tortuous atherosclerotic aorta. Patchy and confluent opacity in the left mid lower lung zone. Possible small bilateral pleural effusions. Crowding of bronchovascular structures  with possible interstitial edema. Minimal right infrahilar atelectasis. No evidence of pneumothorax. The bones are under mineralized. IMPRESSION: 1. Patchy and confluent left mid lower lung zone opacity suspicious for pneumonia. Recommend continued follow-up with PA and lateral chest X-ray following trial of antibiotic therapy to ensure resolution and exclude underlying malignancy. 2. Cardiomegaly with possible interstitial edema and small pleural effusions. Electronically Signed   By: Jeb Levering M.D.   On: 04/12/2016 23:16   Dg Abdomen 1 View  Result Date: 04/12/2016 CLINICAL DATA:  Femoral central line placement. EXAM: ABDOMEN - 1 VIEW COMPARISON:  Radiographs 07/26/2014 FINDINGS: Right femoral catheter tip projects over the region of the right external/common iliac vein. Question submucosal edema involving the descending colon. No dilated bowel loops. Extensive vascular calcifications. No evidence of free air on single view. IMPRESSION: Tip of the right femoral catheter projects over the region of the right external/common iliac vein. Question submucosal edema about the descending colon, recommend correlation for colitis symptoms. Electronically Signed   By: Jeb Levering M.D.   On: 04/12/2016 23:14    ____________________________________________   PROCEDURES  Procedure(s) performed:   .Critical Care Performed by: Hinda Kehr Authorized by: Hinda Kehr   Critical care provider statement:    Critical care time (minutes):  60   Critical care time was exclusive of:  Separately billable procedures and treating other patients   Critical care was necessary to treat or prevent imminent or life-threatening deterioration of the following conditions:  Sepsis and circulatory failure   Critical care was time spent personally by me on the following activities:  Development of treatment plan with patient or surrogate, discussions with consultants, evaluation of patient's response to  treatment, examination of patient, obtaining history from patient or surrogate, ordering and performing treatments and interventions, ordering and review of laboratory studies, ordering and review of radiographic studies, pulse oximetry, re-evaluation of patient's condition and review of old charts .Central Line Date/Time: 04/12/2016 10:29 PM Performed by: Hinda Kehr Authorized by: Hinda Kehr   Consent:    Consent obtained:  Emergent situation Pre-procedure details:    Hand  hygiene: Hand hygiene performed prior to insertion     Sterile barrier technique: All elements of maximal sterile technique followed     Skin preparation:  2% chlorhexidine   Skin preparation agent: Skin preparation agent completely dried prior to procedure   Anesthesia (see MAR for exact dosages):    Anesthesia method:  None Procedure details:    Location:  R internal jugular   Patient position:  Trendelenburg   Procedural supplies:  Triple lumen   Landmarks identified: yes     Ultrasound guidance: yes     Sterile ultrasound techniques: Sterile gel and sterile probe covers were used     Number of attempts:  1   Successful placement: no (Line failed to draw back or flush in spite of initial dark venous blood return when needle was placed.  )   Post-procedure details:    Post-procedure:  Dressing applied Comments:     Unsuccessful right IJ attempt.  Initially I was in the jugular vein based on slow trickle of dark venous blood, but the wire had difficulty passing and even though I thought it was successful I suspect I tunneled through the soft tissue.  After the line itself was placed I had no blood return, could not aspirate any blood, and could not flush.  I removed the line and placed a firm direct pressure for 5-10 minutes.  There was no developing hematoma.  The patient stated that he felt better, surprisingly, after the procedure and had no increased difficulty breathing. .Central Line Date/Time: 04/12/2016  10:34 PM Performed by: Hinda Kehr Authorized by: Hinda Kehr   Consent:    Consent obtained:  Emergent situation Pre-procedure details:    Hand hygiene: Hand hygiene performed prior to insertion     All elements of maximal sterile barrier technique followed: emergent line after failure of right IJ and persistent hypotension.     Skin preparation:  2% chlorhexidine   Skin preparation agent: Skin preparation agent completely dried prior to procedure   Procedure details:    Patient position:  Trendelenburg   Procedural supplies:  Triple lumen   Landmarks identified: yes     Ultrasound guidance: yes     Number of attempts:  2   Successful placement: yes   Post-procedure details:    Post-procedure:  Dressing applied and line sutured   Assessment:  Blood return through all ports and free fluid flow   Patient tolerance of procedure:  Tolerated well, no immediate complications     Critical Care performed: Yes, see critical care procedure note(s) ____________________________________________   INITIAL IMPRESSION / ASSESSMENT AND PLAN / ED COURSE  Pertinent labs & imaging results that were available during my care of the patient were reviewed by me and considered in my medical decision making (see chart for details).  The patient was clearly septic and critically ill upon arrival in the emergency department.  Peripheral access was not possible due to his hypertension and bilateral upper extremity dialysis fistulas.  I evaluated for a possible EJ but that was also not visible or palpable.  Given his critical illness I proceeded with an attempted right IJ but was unsuccessful as documented above.  I then proceeded with an emergent right femoral line which was successful.  We gave empiric antibiotics (the recommended course for penicillin allergic patients for an unknown source) and I ordered 30 mL/kg of normal saline given his persistent hypotension in spite of the fact that he is a  dialysis patient (he received  a full course of dialysis today and is significantly volume depleted in the setting of sepsis).  He did not declining course as we were placing central line and working him up.  He stated that he felt better after getting some fluids.  However, my concern is that this is a severely chronically ill patient on hemodialysis who presented with all of his vital signs abnormal: He was tachycardic, hypotensive, hypoxemic at 70% on room air, tachypnea, and febrile.  He appears to have multilobar pneumonia on chest x-ray which could certainly explain his hypoxemia and other symptoms.  His blood pressure is improving, but I drawn we feel that he would benefit from being in the ICU at least overnight to make sure that he stabilizes.  I spoke with the intensivist by phone at approximately 2340 in the intensivist did ask about the possibility of admitting to the hospitalist, but I expressed my concern for the reasons described above.  He sent the mid-level provider from the ICU to do the admission.    She gave him a trial on room air and he was able to maintain his oxygenation which is encouraging, and his blood pressure has normalized after 2 L of fluid.  She also asked me about the possibility of the patient going to the hospitalist or even the floor so that they can preserve there remaining bed in the ICU, but I again explained that even though the patient has turned around significantly in the last couple of hours that I have been managing him, I still feel he is at significant risk of acute and rapid decompensation.  She will proceed with admission at this time, but I encouraged her to speak with her intensivist and/or with the hospitalist if they want to work out a different admission plan but I feel that the patient would benefit from intensive care overnight.  ____________________________________________  FINAL CLINICAL IMPRESSION(S) / ED DIAGNOSES  Final diagnoses:  Sepsis, due  to unspecified organism ([place])  Pneumonia of left lung due to infectious organism, unspecified part of lung  ESRD on dialysis ([place])  Hypotension, unspecified hypotension type     MEDICATIONS GIVEN DURING THIS VISIT:  Medications  levofloxacin (LEVAQUIN) IVPB 500 mg (not administered)  vancomycin (VANCOCIN) 1,500 mg in sodium chloride 0.9 % 500 mL IVPB (not administered)  0.9 %  sodium chloride infusion (not administered)  heparin injection 5,000 Units (not administered)  ipratropium-albuterol (DUONEB) 0.5-2.5 (3) MG/3ML nebulizer solution 3 mL (not administered)  aztreonam (AZACTAM) 2 g in dextrose 5 % 50 mL IVPB (not administered)  levofloxacin (LEVAQUIN) IVPB 750 mg (not administered)  acetaminophen (TYLENOL) tablet 650 mg (not administered)  ondansetron (ZOFRAN) injection 4 mg (not administered)  atorvastatin (LIPITOR) tablet 20 mg (not administered)  docusate sodium (COLACE) capsule 100 mg (not administered)  gabapentin (NEURONTIN) capsule 100 mg (not administered)  HYDROcodone-acetaminophen (NORCO/VICODIN) 5-325 MG per tablet 1 tablet (not administered)  lamoTRIgine (LAMICTAL) tablet 100 mg (not administered)  levETIRAcetam (KEPPRA) tablet 500 mg (not administered)  sevelamer carbonate (RENVELA) tablet 800 mg (not administered)  norepinephrine (LEVOPHED) 4 mg in dextrose 5 % 250 mL (0.016 mg/mL) infusion (not administered)  0.9 %  sodium chloride infusion ( Intravenous New Bag/Given 04/13/16 0056)  0.9 %  sodium chloride infusion (not administered)  norepinephrine (LEVOPHED) 4-5 MG/250ML-% infusion (not administered)  sodium chloride 0.9 % bolus 1,000 mL (0 mLs Intravenous Stopped 04/12/16 2314)    And  sodium chloride 0.9 % bolus 1,000 mL (0 mLs Intravenous Stopped  04/12/16 2356)    And  sodium chloride 0.9 % bolus 250 mL (250 mLs Intravenous New Bag/Given 04/12/16 2230)  levofloxacin (LEVAQUIN) IVPB 750 mg (0 mg Intravenous Stopped 04/13/16 0013)  aztreonam (AZACTAM) 2 g in  dextrose 5 % 50 mL IVPB (0 g Intravenous Stopped 04/13/16 0047)  acetaminophen (TYLENOL) suppository 650 mg (650 mg Rectal Given 04/12/16 2303)     NEW OUTPATIENT MEDICATIONS STARTED DURING THIS VISIT:  New Prescriptions   No medications on file    Modified Medications   No medications on file    Discontinued Medications   CALCIUM ACETATE (PHOSLO) 667 MG CAPSULE    Take 667 mg by mouth 3 (three) times daily with meals.   GUAIFENESIN (ROBITUSSIN) 100 MG/5ML SYRUP    Take by mouth 4 (four) times daily as needed for cough.   LAMOTRIGINE (LAMICTAL PO)    Take 50 mg by mouth 2 (two) times daily.   SITAGLIPTIN (JANUVIA) 100 MG TABLET    Take 100 mg by mouth daily.   TRAMADOL (ULTRAM) 50 MG TABLET    Take 1 tablet (50 mg total) by mouth every 6 (six) hours as needed.     Note:  This document was prepared using Dragon voice recognition software and may include unintentional dictation errors.    Hinda Kehr, MD 04/13/16 [phone]

## 2016-04-12 NOTE — ED Notes (Signed)
Critical troponin of 0.08 called from lab. Dr. Karma Greaser notified. Pt continues to remain in trendelenburg to promote normotension. ivf infusing without difficulty. Spouse remains at bedside. Pt and spouse updated on care plan.

## 2016-04-12 NOTE — ED Triage Notes (Signed)
Pt from [facility] [place] [facility] with fever, neck pain, hypotension post dialysis today. perrl 48mm and sluggish. Pt can move all extremities. Pt with active dialysis shunts to bilateral arms. fsbs of 128 per ems. Ems states pt with blood pressure of 0000000 systolic and pox of [id number] on non rebreather.

## 2016-04-12 NOTE — ED Notes (Signed)
Spouse at bedside and updated on pt's plan of care. Spouse verbalizes understanding.

## 2016-04-12 NOTE — ED Notes (Signed)
This rn attempt iv times 2 without success, jane, rn in to attempt iv start. md notified of bilateral upper extremity dialysis shunts and need for possible EJ.

## 2016-04-12 NOTE — ED Notes (Signed)
Iv site is not flushing, md at bedside to perform emergency central line placement.

## 2016-04-13 DIAGNOSIS — A419 Sepsis, unspecified organism: Secondary | ICD-10-CM

## 2016-04-13 DIAGNOSIS — I120 Hypertensive chronic kidney disease with stage 5 chronic kidney disease or end stage renal disease: Secondary | ICD-10-CM | POA: Diagnosis present

## 2016-04-13 DIAGNOSIS — J189 Pneumonia, unspecified organism: Secondary | ICD-10-CM | POA: Diagnosis not present

## 2016-04-13 DIAGNOSIS — D631 Anemia in chronic kidney disease: Secondary | ICD-10-CM | POA: Diagnosis present

## 2016-04-13 DIAGNOSIS — E785 Hyperlipidemia, unspecified: Secondary | ICD-10-CM | POA: Diagnosis present

## 2016-04-13 DIAGNOSIS — A411 Sepsis due to other specified staphylococcus: Secondary | ICD-10-CM | POA: Diagnosis present

## 2016-04-13 DIAGNOSIS — G40909 Epilepsy, unspecified, not intractable, without status epilepticus: Secondary | ICD-10-CM | POA: Diagnosis present

## 2016-04-13 DIAGNOSIS — I953 Hypotension of hemodialysis: Secondary | ICD-10-CM | POA: Diagnosis present

## 2016-04-13 DIAGNOSIS — E1140 Type 2 diabetes mellitus with diabetic neuropathy, unspecified: Secondary | ICD-10-CM | POA: Diagnosis present

## 2016-04-13 DIAGNOSIS — N2581 Secondary hyperparathyroidism of renal origin: Secondary | ICD-10-CM | POA: Diagnosis present

## 2016-04-13 DIAGNOSIS — R748 Abnormal levels of other serum enzymes: Secondary | ICD-10-CM | POA: Diagnosis present

## 2016-04-13 DIAGNOSIS — R0902 Hypoxemia: Secondary | ICD-10-CM | POA: Diagnosis present

## 2016-04-13 DIAGNOSIS — E1122 Type 2 diabetes mellitus with diabetic chronic kidney disease: Secondary | ICD-10-CM | POA: Diagnosis present

## 2016-04-13 DIAGNOSIS — Z96642 Presence of left artificial hip joint: Secondary | ICD-10-CM | POA: Diagnosis present

## 2016-04-13 DIAGNOSIS — K219 Gastro-esophageal reflux disease without esophagitis: Secondary | ICD-10-CM | POA: Diagnosis present

## 2016-04-13 DIAGNOSIS — I9589 Other hypotension: Secondary | ICD-10-CM | POA: Diagnosis present

## 2016-04-13 DIAGNOSIS — F329 Major depressive disorder, single episode, unspecified: Secondary | ICD-10-CM | POA: Diagnosis present

## 2016-04-13 DIAGNOSIS — F0150 Vascular dementia without behavioral disturbance: Secondary | ICD-10-CM | POA: Diagnosis present

## 2016-04-13 DIAGNOSIS — E1151 Type 2 diabetes mellitus with diabetic peripheral angiopathy without gangrene: Secondary | ICD-10-CM | POA: Diagnosis present

## 2016-04-13 DIAGNOSIS — N186 End stage renal disease: Secondary | ICD-10-CM | POA: Diagnosis present

## 2016-04-13 DIAGNOSIS — I5032 Chronic diastolic (congestive) heart failure: Secondary | ICD-10-CM | POA: Diagnosis present

## 2016-04-13 DIAGNOSIS — R6521 Severe sepsis with septic shock: Secondary | ICD-10-CM

## 2016-04-13 DIAGNOSIS — E1142 Type 2 diabetes mellitus with diabetic polyneuropathy: Secondary | ICD-10-CM | POA: Diagnosis present

## 2016-04-13 DIAGNOSIS — N179 Acute kidney failure, unspecified: Secondary | ICD-10-CM | POA: Diagnosis present

## 2016-04-13 DIAGNOSIS — A4159 Other Gram-negative sepsis: Secondary | ICD-10-CM | POA: Diagnosis present

## 2016-04-13 LAB — BLOOD CULTURE ID PANEL (REFLEXED)
Acinetobacter baumannii: NOT DETECTED
CANDIDA ALBICANS: NOT DETECTED
CANDIDA KRUSEI: NOT DETECTED
CANDIDA PARAPSILOSIS: NOT DETECTED
CANDIDA TROPICALIS: NOT DETECTED
Candida glabrata: NOT DETECTED
ENTEROBACTERIACEAE SPECIES: NOT DETECTED
ESCHERICHIA COLI: NOT DETECTED
Enterobacter cloacae complex: NOT DETECTED
Enterococcus species: DETECTED — AB
HAEMOPHILUS INFLUENZAE: NOT DETECTED
KLEBSIELLA OXYTOCA: NOT DETECTED
KLEBSIELLA PNEUMONIAE: NOT DETECTED
Listeria monocytogenes: NOT DETECTED
Neisseria meningitidis: NOT DETECTED
PSEUDOMONAS AERUGINOSA: NOT DETECTED
Proteus species: NOT DETECTED
STAPHYLOCOCCUS AUREUS BCID: NOT DETECTED
STREPTOCOCCUS PNEUMONIAE: NOT DETECTED
STREPTOCOCCUS PYOGENES: NOT DETECTED
Serratia marcescens: NOT DETECTED
Staphylococcus species: NOT DETECTED
Streptococcus agalactiae: NOT DETECTED
Streptococcus species: NOT DETECTED
Vancomycin resistance: NOT DETECTED

## 2016-04-13 LAB — BASIC METABOLIC PANEL
Anion gap: 12 (ref 5–15)
BUN: 46 mg/dL — AB (ref 6–20)
CALCIUM: 7.8 mg/dL — AB (ref 8.9–10.3)
CO2: 25 mmol/L (ref 22–32)
Chloride: 99 mmol/L — ABNORMAL LOW (ref 101–111)
Creatinine, Ser: 6.77 mg/dL — ABNORMAL HIGH (ref 0.61–1.24)
GFR calc Af Amer: 8 mL/min — ABNORMAL LOW (ref >60)
GFR, EST NON AFRICAN AMERICAN: 7 mL/min — AB (ref >60)
GLUCOSE: 240 mg/dL — AB (ref 65–99)
POTASSIUM: 4.6 mmol/L (ref 3.5–5.1)
Sodium: 136 mmol/L (ref 135–145)

## 2016-04-13 LAB — MAGNESIUM: MAGNESIUM: 1.7 mg/dL (ref 1.7–2.4)

## 2016-04-13 LAB — GLUCOSE, CAPILLARY
GLUCOSE-CAPILLARY: 153 mg/dL — AB (ref 65–99)
GLUCOSE-CAPILLARY: 222 mg/dL — AB (ref 65–99)
GLUCOSE-CAPILLARY: 201 mg/dL — AB (ref 65–99)
GLUCOSE-CAPILLARY: 232 mg/dL — AB (ref 65–99)
GLUCOSE-CAPILLARY: 314 mg/dL — AB (ref 65–99)
Glucose-Capillary: 182 mg/dL — ABNORMAL HIGH (ref 65–99)

## 2016-04-13 LAB — CBC
HCT: 32.1 % — ABNORMAL LOW (ref 40.0–52.0)
Hemoglobin: 10.5 g/dL — ABNORMAL LOW (ref 13.0–18.0)
MCH: 30.8 pg (ref 26.0–34.0)
MCHC: 32.7 g/dL (ref 32.0–36.0)
MCV: 94.2 fL (ref 80.0–100.0)
PLATELETS: 215 10*3/uL (ref 150–440)
RBC: 3.41 MIL/uL — ABNORMAL LOW (ref 4.40–5.90)
RDW: 15 % — ABNORMAL HIGH (ref 11.5–14.5)
WBC: 23.6 10*3/uL — ABNORMAL HIGH (ref 3.8–10.6)

## 2016-04-13 LAB — MRSA PCR SCREENING: MRSA by PCR: NEGATIVE

## 2016-04-13 MED ORDER — ATORVASTATIN CALCIUM 20 MG PO TABS
20.0000 mg | ORAL_TABLET | Freq: Every day | ORAL | Status: DC
  Administered 2016-04-13 – 2016-04-18 (×6): 20 mg via ORAL
  Filled 2016-04-13 (×7): qty 1

## 2016-04-13 MED ORDER — ACETAMINOPHEN 325 MG PO TABS: 650.0000 mg | ORAL_TABLET | ORAL | Status: DC | PRN

## 2016-04-13 MED ORDER — SODIUM CHLORIDE 0.9 % IV SOLN: Freq: Once | INTRAVENOUS | Status: DC

## 2016-04-13 MED ORDER — GABAPENTIN 100 MG PO CAPS
100.0000 mg | ORAL_CAPSULE | Freq: Every day | ORAL | Status: DC
  Administered 2016-04-13 – 2016-04-17 (×5): 100 mg via ORAL
  Filled 2016-04-13 (×5): qty 1

## 2016-04-13 MED ORDER — SODIUM CHLORIDE 0.9 % IV SOLN
INTRAVENOUS | Status: DC
  Administered 2016-04-13: 01:00:00 via INTRAVENOUS

## 2016-04-13 MED ORDER — NOREPINEPHRINE BITARTRATE 1 MG/ML IV SOLN
2.0000 ug/min | INTRAVENOUS | Status: AC
  Administered 2016-04-13: 2 ug/min via INTRAVENOUS

## 2016-04-13 MED ORDER — DEXTROSE 5 % IV SOLN
500.0000 mg | Freq: Two times a day (BID) | INTRAVENOUS | Status: DC
  Administered 2016-04-13 – 2016-04-14 (×3): 500 mg via INTRAVENOUS
  Filled 2016-04-13 (×6): qty 0.5

## 2016-04-13 MED ORDER — SODIUM CHLORIDE 0.9 % IV SOLN: 250.0000 mL | INTRAVENOUS | Status: DC | PRN

## 2016-04-13 MED ORDER — INSULIN ASPART 100 UNIT/ML ~~LOC~~ SOLN
11.0000 [IU] | Freq: Once | SUBCUTANEOUS | Status: AC
  Administered 2016-04-13: 11 [IU] via SUBCUTANEOUS
  Filled 2016-04-13: qty 11

## 2016-04-13 MED ORDER — LEVETIRACETAM 500 MG PO TABS
500.0000 mg | ORAL_TABLET | Freq: Every day | ORAL | Status: DC
  Administered 2016-04-13 – 2016-04-18 (×6): 500 mg via ORAL
  Filled 2016-04-13 (×6): qty 1

## 2016-04-13 MED ORDER — ONDANSETRON HCL 4 MG/2ML IJ SOLN: 4.0000 mg | Freq: Four times a day (QID) | INTRAMUSCULAR | Status: DC | PRN

## 2016-04-13 MED ORDER — INSULIN ASPART 100 UNIT/ML ~~LOC~~ SOLN
0.0000 [IU] | Freq: Three times a day (TID) | SUBCUTANEOUS | Status: DC
  Administered 2016-04-14 – 2016-04-15 (×3): 5 [IU] via SUBCUTANEOUS
  Administered 2016-04-15 – 2016-04-16 (×2): 3 [IU] via SUBCUTANEOUS
  Administered 2016-04-16: 8 [IU] via SUBCUTANEOUS
  Administered 2016-04-17: 3 [IU] via SUBCUTANEOUS
  Administered 2016-04-17 – 2016-04-18 (×2): 5 [IU] via SUBCUTANEOUS
  Filled 2016-04-13: qty 3
  Filled 2016-04-13: qty 5
  Filled 2016-04-13: qty 2
  Filled 2016-04-13 (×2): qty 5
  Filled 2016-04-13: qty 3
  Filled 2016-04-13: qty 8
  Filled 2016-04-13 (×2): qty 5
  Filled 2016-04-13: qty 3

## 2016-04-13 MED ORDER — LAMOTRIGINE 100 MG PO TABS
100.0000 mg | ORAL_TABLET | Freq: Every day | ORAL | Status: DC
  Administered 2016-04-13 – 2016-04-17 (×5): 100 mg via ORAL
  Filled 2016-04-13 (×5): qty 1

## 2016-04-13 MED ORDER — HEPARIN SODIUM (PORCINE) 5000 UNIT/ML IJ SOLN
5000.0000 [IU] | Freq: Three times a day (TID) | INTRAMUSCULAR | Status: DC
  Administered 2016-04-13 – 2016-04-18 (×17): 5000 [IU] via SUBCUTANEOUS
  Filled 2016-04-13 (×17): qty 1

## 2016-04-13 MED ORDER — VANCOMYCIN HCL 10 G IV SOLR
1500.0000 mg | Freq: Once | INTRAVENOUS | Status: AC
  Administered 2016-04-13: 1500 mg via INTRAVENOUS
  Filled 2016-04-13: qty 1500

## 2016-04-13 MED ORDER — VASOPRESSIN 20 UNIT/ML IV SOLN
0.0300 [IU]/min | INTRAVENOUS | Status: DC
  Administered 2016-04-13: 0.03 [IU]/min via INTRAVENOUS
  Filled 2016-04-13 (×2): qty 2

## 2016-04-13 MED ORDER — VANCOMYCIN HCL IN DEXTROSE 750-5 MG/150ML-% IV SOLN: 750.0000 mg | INTRAVENOUS | Status: DC | PRN

## 2016-04-13 MED ORDER — DOCUSATE SODIUM 100 MG PO CAPS
100.0000 mg | ORAL_CAPSULE | Freq: Two times a day (BID) | ORAL | Status: DC | PRN
  Administered 2016-04-17: 100 mg via ORAL
  Filled 2016-04-13: qty 1

## 2016-04-13 MED ORDER — INSULIN ASPART 100 UNIT/ML ~~LOC~~ SOLN
0.0000 [IU] | Freq: Every day | SUBCUTANEOUS | Status: DC
  Administered 2016-04-16: 3 [IU] via SUBCUTANEOUS
  Administered 2016-04-17: 2 [IU] via SUBCUTANEOUS
  Filled 2016-04-13: qty 2
  Filled 2016-04-13: qty 3

## 2016-04-13 MED ORDER — LEVOFLOXACIN IN D5W 750 MG/150ML IV SOLN: 750.0000 mg | INTRAVENOUS | Status: DC

## 2016-04-13 MED ORDER — INSULIN ASPART 100 UNIT/ML ~~LOC~~ SOLN
2.0000 [IU] | SUBCUTANEOUS | Status: DC
  Administered 2016-04-13: 6 [IU] via SUBCUTANEOUS
  Administered 2016-04-13: 4 [IU] via SUBCUTANEOUS
  Administered 2016-04-13: 6 [IU] via SUBCUTANEOUS
  Administered 2016-04-13: 4 [IU] via SUBCUTANEOUS
  Filled 2016-04-13 (×2): qty 6
  Filled 2016-04-13: qty 4

## 2016-04-13 MED ORDER — SEVELAMER CARBONATE 800 MG PO TABS
800.0000 mg | ORAL_TABLET | Freq: Three times a day (TID) | ORAL | Status: DC
  Administered 2016-04-13 – 2016-04-18 (×13): 800 mg via ORAL
  Filled 2016-04-13 (×14): qty 1

## 2016-04-13 MED ORDER — NOREPINEPHRINE BITARTRATE 1 MG/ML IV SOLN
2.0000 ug/min | INTRAVENOUS | Status: DC
  Administered 2016-04-13: 12 ug/min via INTRAVENOUS
  Administered 2016-04-13: 18 ug/min via INTRAVENOUS
  Administered 2016-04-13: 13 ug/min via INTRAVENOUS
  Administered 2016-04-14: 15 ug/min via INTRAVENOUS
  Filled 2016-04-13 (×2): qty 16

## 2016-04-13 MED ORDER — IPRATROPIUM-ALBUTEROL 0.5-2.5 (3) MG/3ML IN SOLN: 3.0000 mL | RESPIRATORY_TRACT | Status: DC | PRN

## 2016-04-13 MED ORDER — NOREPINEPHRINE 4 MG/250ML-% IV SOLN
INTRAVENOUS | Status: AC
  Filled 2016-04-13: qty 250

## 2016-04-13 MED ORDER — PANTOPRAZOLE SODIUM 40 MG PO TBEC
40.0000 mg | DELAYED_RELEASE_TABLET | Freq: Every day | ORAL | Status: DC
  Administered 2016-04-13 – 2016-04-18 (×6): 40 mg via ORAL
  Filled 2016-04-13 (×6): qty 1

## 2016-04-13 MED ORDER — HYDROCORTISONE NICU INJ SYRINGE 50 MG/ML: 50.0000 mg | Freq: Four times a day (QID) | INTRAVENOUS | Status: DC

## 2016-04-13 MED ORDER — HYDROCODONE-ACETAMINOPHEN 5-325 MG PO TABS: 1.0000 | ORAL_TABLET | Freq: Four times a day (QID) | ORAL | Status: DC | PRN

## 2016-04-13 MED ORDER — LEVOFLOXACIN IN D5W 500 MG/100ML IV SOLN
500.0000 mg | INTRAVENOUS | Status: DC
  Administered 2016-04-14 – 2016-04-16 (×2): 500 mg via INTRAVENOUS
  Filled 2016-04-13 (×2): qty 100

## 2016-04-13 MED ORDER — HYDROCORTISONE NA SUCCINATE PF 100 MG IJ SOLR
50.0000 mg | Freq: Four times a day (QID) | INTRAMUSCULAR | Status: DC
  Administered 2016-04-13 – 2016-04-17 (×17): 50 mg via INTRAVENOUS
  Filled 2016-04-13 (×18): qty 2

## 2016-04-13 MED ORDER — DEXTROSE 5 % IV SOLN: 2.0000 g | Freq: Four times a day (QID) | INTRAVENOUS | Status: DC

## 2016-04-13 NOTE — Consult Note (Signed)
[facility]ious Disease     Reason for Consult:Enterococcal bacteremia    Referring Physician: Governor Specking Date of Admission:  04/12/2016   Active Problems:   CAP (community acquired pneumonia)   HPI: Michael Acosta is a 82 y.o. male admitted with fever, hypotension following HD session. He was found to have sbp in 70s and sats 70%. He had wbc of 19 and temp of 101.7. CXR with infiltrate. BCx + 1/2 with enterococcus.  He denies prodrome of illness, except mild cough.  No UTI sxs and denies back or neck pain at this time.    Past Medical History:  Diagnosis Date  . Anemia   . CVA (cerebral infarction)   . Dementia   . Depression   . Diabetes mellitus without complication ([place])   . Dialysis patient ([place])   . Dysrhythmia    BRADYCARDIA  . Falls frequently   . GERD (gastroesophageal reflux disease)   . Hypertension   . Mood disorder ([place])   . Neuropathy ([place])   . Pain    CHRONIC  . Peripheral vascular disease ([place])   . Prostate cancer ([place])   . Renal disorder    ESRD  . Seizures ([place])    EPILEPSY  . Stroke [facility])    TIA  . Weakness generalized    Past Surgical History:  Procedure Laterality Date  . COLON SURGERY     RESECTION  . INSERTION OF DIALYSIS CATHETER     SHUNT  . LEG SURGERY Left    MULTIPLE VEIN SURGERIES  . TOTAL HIP ARTHROPLASTY Left    Social History  Substance Use Topics  . Smoking status: Never Smoker  . Smokeless tobacco: Never Used  . Alcohol use No   No family history on file.  Allergies:  Allergies  Allergen Reactions  . Aspirin   . Bc Powder [Aspirin-Salicylamide-Caffeine]   . 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 atorvastatin  20 mg Oral Daily  . aztreonam  500 mg Intravenous Q12H  .  gabapentin  100 mg Oral QHS  . heparin  5,000 Units Subcutaneous Q8H  . hydrocortisone sod succinate (SOLU-CORTEF) inj  50 mg Intravenous Q6H  . insulin aspart  2-6 Units Subcutaneous Q4H  . lamoTRIgine  100 mg Oral QHS  . levETIRAcetam  500 mg Oral Daily  . [START ON 04/14/2016] levofloxacin (LEVAQUIN) IV  500 mg Intravenous Q48H  . pantoprazole  40 mg Oral Daily  . sevelamer carbonate  800 mg Oral TID WC    Review of Systems - 11 systems reviewed and negative per HPI   OBJECTIVE: Temp:  [98.2 F (36.8 C)-101.7 F (38.7 C)] 98.4 F (36.9 C) (12/30 1600) Pulse Rate:  [63-112] 71 (12/30 1915) Resp:  [12-30] 20 (12/30 1915) BP: (65-158)/(25-138) 99/26 (12/30 1915) SpO2:  [90 %-100 %] 95 % (12/30 1915) Weight:  [72.6 kg (160 lb)-76.1 kg (167 lb 12.3 oz)] 76.1 kg (167 lb 12.3 oz) (12/30 0239) Physical Exam  Constitutional: He is oriented to person, place, and time. He appears well-developed and well-nourished. No distress. obese HENT: anicteric Mouth/Throat: Oropharynx is clear and dry. No oropharyngeal exudate.  Cardiovascular: Normal rate, regular rhythm and normal heart sounds.  Pulmonary/Chest:poor air movement Abdominal: Soft. Bowel sounds are normal. He exhibits no distension. There is no tenderness.  Lymphadenopathy: He has no cervical adenopathy.  Neurological: He is alert and oriented to person, place, and time.  LUE AVF site wnl  Skin: Skin is warm and dry. No rash noted. No erythema.  Psychiatric: He has a normal mood and affect. His behavior is normal.     LABS: Results for orders placed or performed during the hospital encounter of 04/12/16 (from the past 48 hour(s))  Lactic acid, plasma     Status: None   Collection Time: 04/12/16  9:27 PM  Result Value Ref Range   Lactic Acid, Venous 1.7 0.5 - 1.9 mmol/L  Blood Culture (routine x 2)     Status: None (Preliminary result)   Collection Time: 04/12/16  9:27 PM  Result Value Ref Range   Specimen Description  BLOOD RIGHT WRIST    Special Requests BOTTLES DRAWN AEROBIC AND ANAEROBIC 4CCAERO,4CCANA    Culture  Setup Time      GRAM POSITIVE COCCI IN BOTH AEROBIC AND ANAEROBIC BOTTLES CRITICAL RESULT CALLED TO, READ BACK BY AND VERIFIED WITH: SHEEMA HALLAJI ON 04/13/16 AT 1614 [place]    Culture GRAM POSITIVE COCCI    Report Status PENDING   Blood Culture ID Panel (Reflexed)     Status: Abnormal   Collection Time: 04/12/16  9:27 PM  Result Value Ref Range   Enterococcus species DETECTED (A) NOT DETECTED    Comment: CRITICAL RESULT CALLED TO, READ BACK BY AND VERIFIED WITH: SHEEMA HALLAJI ON 04/13/16 AT 1614 [place]    Vancomycin resistance NOT DETECTED NOT DETECTED   Listeria monocytogenes NOT DETECTED NOT DETECTED   Staphylococcus species NOT DETECTED NOT DETECTED   Staphylococcus aureus NOT DETECTED NOT DETECTED   Streptococcus species NOT DETECTED NOT DETECTED   Streptococcus agalactiae NOT DETECTED NOT DETECTED   Streptococcus pneumoniae NOT DETECTED NOT DETECTED   Streptococcus pyogenes NOT DETECTED NOT DETECTED   Acinetobacter baumannii NOT DETECTED NOT DETECTED   Enterobacteriaceae species NOT DETECTED NOT DETECTED   Enterobacter cloacae complex NOT DETECTED NOT DETECTED   Escherichia coli NOT DETECTED NOT DETECTED   Klebsiella oxytoca NOT DETECTED NOT DETECTED   Klebsiella pneumoniae NOT DETECTED NOT DETECTED   Proteus species NOT DETECTED NOT DETECTED   Serratia marcescens NOT DETECTED NOT DETECTED   Haemophilus influenzae NOT DETECTED NOT DETECTED   Neisseria meningitidis NOT DETECTED NOT DETECTED   Pseudomonas aeruginosa NOT DETECTED NOT DETECTED   Candida albicans NOT DETECTED NOT DETECTED   Candida glabrata NOT DETECTED NOT DETECTED   Candida krusei NOT DETECTED NOT DETECTED   Candida parapsilosis NOT DETECTED NOT DETECTED   Candida tropicalis NOT DETECTED NOT DETECTED  Blood Culture (routine x 2)     Status: None (Preliminary result)   Collection Time: 04/12/16 10:37 PM  Result  Value Ref Range   Specimen Description BLOOD CENTRAL LINE    Special Requests BOTTLES DRAWN AEROBIC AND ANAEROBIC 6CCAERO,3CCANA    Culture NO GROWTH < 12 HOURS    Report Status PENDING   CBC with Differential     Status: Abnormal   Collection Time: 04/12/16 10:38 PM  Result Value Ref Range   WBC 19.5 (H) 3.8 - 10.6 K/uL   RBC 3.62 (L) 4.40 - 5.90 MIL/uL   Hemoglobin 11.2 (L) 13.0 - 18.0 g/dL   HCT 34.2 (L) 40.0 - 52.0 %   MCV 94.7 80.0 - 100.0 fL   MCH 30.8 26.0 -  34.0 pg   MCHC 32.6 32.0 - 36.0 g/dL   RDW 14.9 (H) 11.5 - 14.5 %   Platelets 192 150 - 440 K/uL   Neutrophils Relative % 92 %   Neutro Abs 17.9 (H) 1.4 - 6.5 K/uL   Lymphocytes Relative 1 %   Lymphs Abs 0.2 (L) 1.0 - 3.6 K/uL   Monocytes Relative 7 %   Monocytes Absolute 1.3 (H) 0.2 - 1.0 K/uL   Eosinophils Relative 0 %   Eosinophils Absolute 0.0 0 - 0.7 K/uL   Basophils Relative 0 %   Basophils Absolute 0.0 0 - 0.1 K/uL  Comprehensive metabolic panel     Status: Abnormal   Collection Time: 04/12/16 10:38 PM  Result Value Ref Range   Sodium 139 135 - 145 mmol/L   Potassium 4.4 3.5 - 5.1 mmol/L   Chloride 98 (L) 101 - 111 mmol/L   CO2 25 22 - 32 mmol/L   Glucose, Bld 161 (H) 65 - 99 mg/dL   BUN 45 (H) 6 - 20 mg/dL   Creatinine, Ser 6.97 (H) 0.61 - 1.24 mg/dL   Calcium 8.5 (L) 8.9 - 10.3 mg/dL   Total Protein 7.5 6.5 - 8.1 g/dL   Albumin 3.5 3.5 - 5.0 g/dL   AST 66 (H) 15 - 41 U/L   ALT 70 (H) 17 - 63 U/L   Alkaline Phosphatase 142 (H) 38 - 126 U/L   Total Bilirubin 1.2 0.3 - 1.2 mg/dL   GFR calc non Af Amer 6 (L) >60 mL/min   GFR calc Af Amer 7 (L) >60 mL/min    Comment: (NOTE) The eGFR has been calculated using the CKD EPI equation. This calculation has not been validated in all clinical situations. eGFR's persistently <60 mL/min signify possible Chronic Kidney Disease.    Anion gap 16 (H) 5 - 15  Troponin I     Status: Abnormal   Collection Time: 04/12/16 10:38 PM  Result Value Ref Range   Troponin  I 0.08 (HH) <0.03 ng/mL    Comment: CRITICAL RESULT CALLED TO, READ BACK BY AND VERIFIED WITH APRIL BRUMGARD AT 2316 04/12/16.PMH  Lipase, blood     Status: None   Collection Time: 04/12/16 10:38 PM  Result Value Ref Range   Lipase 34 11 - 51 U/L  APTT     Status: None   Collection Time: 04/12/16 10:38 PM  Result Value Ref Range   aPTT 33 24 - 36 seconds  Protime-INR     Status: None   Collection Time: 04/12/16 10:38 PM  Result Value Ref Range   Prothrombin Time 13.8 11.4 - 15.2 seconds   INR 1.06   Influenza panel by PCR (type A & B, H1N1)     Status: None   Collection Time: 04/12/16 10:49 PM  Result Value Ref Range   Influenza A By PCR NEGATIVE NEGATIVE   Influenza B By PCR NEGATIVE NEGATIVE    Comment: (NOTE) The Xpert Xpress Flu assay is intended as an aid in the diagnosis of  influenza and should not be used as a sole basis for treatment.  This  assay is FDA approved for nasopharyngeal swab specimens only. Nasal  washings and aspirates are unacceptable for Xpert Xpress Flu testing.   Glucose, capillary     Status: Abnormal   Collection Time: 04/13/16  2:02 AM  Result Value Ref Range   Glucose-Capillary 153 (H) 65 - 99 mg/dL  MRSA PCR Screening     Status: None  Collection Time: 04/13/16  2:24 AM  Result Value Ref Range   MRSA by PCR NEGATIVE NEGATIVE    Comment:        The GeneXpert MRSA Assay (FDA approved for NASAL specimens only), is one component of a comprehensive MRSA colonization surveillance program. It is not intended to diagnose MRSA infection nor to guide or monitor treatment for MRSA infections.   Glucose, capillary     Status: Abnormal   Collection Time: 04/13/16  3:28 AM  Result Value Ref Range   Glucose-Capillary 222 (H) 65 - 99 mg/dL   Comment 1 Notify RN   CBC     Status: Abnormal   Collection Time: 04/13/16  4:52 AM  Result Value Ref Range   WBC 23.6 (H) 3.8 - 10.6 K/uL   RBC 3.41 (L) 4.40 - 5.90 MIL/uL   Hemoglobin 10.5 (L) 13.0 - 18.0  g/dL   HCT 32.1 (L) 40.0 - 52.0 %   MCV 94.2 80.0 - 100.0 fL   MCH 30.8 26.0 - 34.0 pg   MCHC 32.7 32.0 - 36.0 g/dL   RDW 15.0 (H) 11.5 - 14.5 %   Platelets 215 150 - 440 K/uL  Magnesium     Status: None   Collection Time: 04/13/16  4:52 AM  Result Value Ref Range   Magnesium 1.7 1.7 - 2.4 mg/dL  Basic metabolic panel     Status: Abnormal   Collection Time: 04/13/16  4:52 AM  Result Value Ref Range   Sodium 136 135 - 145 mmol/L   Potassium 4.6 3.5 - 5.1 mmol/L   Chloride 99 (L) 101 - 111 mmol/L   CO2 25 22 - 32 mmol/L   Glucose, Bld 240 (H) 65 - 99 mg/dL   BUN 46 (H) 6 - 20 mg/dL   Creatinine, Ser 6.77 (H) 0.61 - 1.24 mg/dL   Calcium 7.8 (L) 8.9 - 10.3 mg/dL   GFR calc non Af Amer 7 (L) >60 mL/min   GFR calc Af Amer 8 (L) >60 mL/min    Comment: (NOTE) The eGFR has been calculated using the CKD EPI equation. This calculation has not been validated in all clinical situations. eGFR's persistently <60 mL/min signify possible Chronic Kidney Disease.    Anion gap 12 5 - 15  Glucose, capillary     Status: Abnormal   Collection Time: 04/13/16  7:34 AM  Result Value Ref Range   Glucose-Capillary 201 (H) 65 - 99 mg/dL  Glucose, capillary     Status: Abnormal   Collection Time: 04/13/16 11:41 AM  Result Value Ref Range   Glucose-Capillary 182 (H) 65 - 99 mg/dL  Glucose, capillary     Status: Abnormal   Collection Time: 04/13/16  3:58 PM  Result Value Ref Range   Glucose-Capillary 232 (H) 65 - 99 mg/dL   No components found for: ESR, C REACTIVE PROTEIN MICRO: Recent Results (from the past 720 hour(s))  Blood Culture (routine x 2)     Status: None (Preliminary result)   Collection Time: 04/12/16  9:27 PM  Result Value Ref Range Status   Specimen Description BLOOD RIGHT WRIST  Final   Special Requests BOTTLES DRAWN AEROBIC AND ANAEROBIC 4CCAERO,4CCANA  Final   Culture  Setup Time   Final    GRAM POSITIVE COCCI IN BOTH AEROBIC AND ANAEROBIC BOTTLES CRITICAL RESULT CALLED TO,  READ BACK BY AND VERIFIED WITH: SHEEMA HALLAJI ON 04/13/16 AT [place] [facility]    Culture [place]  Final  Report Status PENDING  Incomplete  Blood Culture ID Panel (Reflexed)     Status: Abnormal   Collection Time: 04/12/16  9:27 PM  Result Value Ref Range Status   Enterococcus species DETECTED (A) NOT DETECTED Final    Comment: CRITICAL RESULT CALLED TO, READ BACK BY AND VERIFIED WITH: SHEEMA HALLAJI ON 04/13/16 AT 1614 [place]    Vancomycin resistance NOT DETECTED NOT DETECTED Final   Listeria monocytogenes NOT DETECTED NOT DETECTED Final   Staphylococcus species NOT DETECTED NOT DETECTED Final   Staphylococcus aureus NOT DETECTED NOT DETECTED Final   Streptococcus species NOT DETECTED NOT DETECTED Final   Streptococcus agalactiae NOT DETECTED NOT DETECTED Final   Streptococcus pneumoniae NOT DETECTED NOT DETECTED Final   Streptococcus pyogenes NOT DETECTED NOT DETECTED Final   Acinetobacter baumannii NOT DETECTED NOT DETECTED Final   Enterobacteriaceae species NOT DETECTED NOT DETECTED Final   Enterobacter cloacae complex NOT DETECTED NOT DETECTED Final   Escherichia coli NOT DETECTED NOT DETECTED Final   Klebsiella oxytoca NOT DETECTED NOT DETECTED Final   Klebsiella pneumoniae NOT DETECTED NOT DETECTED Final   Proteus species NOT DETECTED NOT DETECTED Final   Serratia marcescens NOT DETECTED NOT DETECTED Final   Haemophilus influenzae NOT DETECTED NOT DETECTED Final   Neisseria meningitidis NOT DETECTED NOT DETECTED Final   Pseudomonas aeruginosa NOT DETECTED NOT DETECTED Final   Candida albicans NOT DETECTED NOT DETECTED Final   Candida glabrata NOT DETECTED NOT DETECTED Final   Candida krusei NOT DETECTED NOT DETECTED Final   Candida parapsilosis NOT DETECTED NOT DETECTED Final   Candida tropicalis NOT DETECTED NOT DETECTED Final  Blood Culture (routine x 2)     Status: None (Preliminary result)   Collection Time: 04/12/16 10:37 PM  Result Value Ref Range Status    Specimen Description BLOOD CENTRAL LINE  Final   Special Requests BOTTLES DRAWN AEROBIC AND ANAEROBIC 6CCAERO,3CCANA  Final   Culture NO GROWTH < 12 HOURS  Final   Report Status PENDING  Incomplete  MRSA PCR Screening     Status: None   Collection Time: 04/13/16  2:24 AM  Result Value Ref Range Status   MRSA by PCR NEGATIVE NEGATIVE Final    Comment:        The GeneXpert MRSA Assay (FDA approved for NASAL specimens only), is one component of a comprehensive MRSA colonization surveillance program. It is not intended to diagnose MRSA infection nor to guide or monitor treatment for MRSA infections.     IMAGING: Dg Chest 1 View  Result Date: 04/12/2016 CLINICAL DATA:  Hypoxia, cough and fever. EXAM: CHEST 1 VIEW COMPARISON:  12/05/2015 FINDINGS: Mild cardiomegaly with tortuous atherosclerotic aorta. Patchy and confluent opacity in the left mid lower lung zone. Possible small bilateral pleural effusions. Crowding of bronchovascular structures with possible interstitial edema. Minimal right infrahilar atelectasis. No evidence of pneumothorax. The bones are under mineralized. IMPRESSION: 1. Patchy and confluent left mid lower lung zone opacity suspicious for pneumonia. Recommend continued follow-up with PA and lateral chest X-ray following trial of antibiotic therapy to ensure resolution and exclude underlying malignancy. 2. Cardiomegaly with possible interstitial edema and small pleural effusions. Electronically Signed   By: Jeb Levering M.D.   On: 04/12/2016 23:16   Dg Abdomen 1 View  Result Date: 04/12/2016 CLINICAL DATA:  Femoral central line placement. EXAM: ABDOMEN - 1 VIEW COMPARISON:  Radiographs 07/26/2014 FINDINGS: Right femoral catheter tip projects over the region of the right external/common iliac vein. Question submucosal edema involving  the descending colon. No dilated bowel loops. Extensive vascular calcifications. No evidence of free air on single view. IMPRESSION: Tip of  the right femoral catheter projects over the region of the right external/common iliac vein. Question submucosal edema about the descending colon, recommend correlation for colitis symptoms. Electronically Signed   By: Jeb Levering M.D.   On: 04/12/2016 23:14    Assessment:   MOUA RASMUSSON is a 82 y.o. male with ESRD admitted with fevers, hypotension, leukocytosis and found to have cxr evidence PNA and enterococcal bacteremia. He has HD through a L arm AVF but this appears normal. No GI or GU sxs. Clinically improving. Unclear source of the isolated enterococcal bacteremia in 1/2 sets.  Recommendations Repeat bcx Continue vanco for the bacteremia- he is pcn allergic For the PNA will order sputum - can dc aztreonam and continue just levofloxacin for now.  Check echo - ordered Thank you very much for allowing me to participate in the care of this patient. Please call with questions.   Cheral Marker. Ola Spurr, MD

## 2016-04-13 NOTE — ED Notes (Signed)
Called np in ccu, binky regarding pt's map of 56 and hypotension. Order received for ns at 38ml/hr and pressors to keep map greater than 60.

## 2016-04-13 NOTE — ED Notes (Signed)
Pt taken off non rebreather to trial room air. Pt is currently 97% on ra.

## 2016-04-13 NOTE — Progress Notes (Signed)
Pharmacy Antibiotic Note  Michael Acosta is a 82 y.o. male admitted on 04/12/2016 with sepsis.  Pharmacy has been consulted for vancomycin. Levaquin, and aztreonam dosing.  Plan: ESRD patient DW 68kg Vancomycin 1500 mg x1 loading dose ordered. 750 mg q HD as continuation. Level before 3rd HD session.  Levaquin 750 mg x1 and then 500 mg q 48 hours ordered.  Aztreonam 2 grams x1 given in ED.  500 mg q 12 hours ordered as continuation per [facility] nomogram.  Height: 5\' 8"  (172.7 cm) Weight: 167 lb 12.3 oz (76.1 kg) IBW/kg (Calculated) : 68.4  Temp (24hrs), Avg:99.9 F (37.7 C), Min:98.3 F (36.8 C), Max:101.7 F (38.7 C)   Recent Labs Lab 04/12/16 2127 04/12/16 2238  WBC  --  19.5*  CREATININE  --  6.97*  LATICACIDVEN 1.7  --     Estimated Creatinine Clearance: 7.6 mL/min (by C-G formula based on SCr of 6.97 mg/dL (H)).    Allergies  Allergen Reactions  . Aspirin   . Bc Powder [Aspirin-Salicylamide-Caffeine]   . 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  Levette Paulick S 04/13/2016 3:36 AM

## 2016-04-13 NOTE — Plan of Care (Signed)
Problem: Pain Managment: Goal: General experience of comfort will improve Outcome: Completed/Met Date Met: 04/13/16 No pain  Problem: Activity: Goal: Risk for activity intolerance will decrease Outcome: Progressing Resting today  Problem: Nutrition: Goal: Adequate nutrition will be maintained Outcome: Progressing Tolerating diet without n/v/d

## 2016-04-13 NOTE — Progress Notes (Signed)
Mr. Lins is alert to person, time and place. Afebrile. Levophed started to keep MAP at 6 or above. Placed on 2L nasal cannula due to O2 sats dropping into low 80's when sleeping. Pt is anuric. R fem central line in place. Pt resting quietly. Will continue to monitor. [place], NP does not want to repeat troponin level.

## 2016-04-13 NOTE — Progress Notes (Signed)
Dr Mortimer Fries aware that Levophed has been increased back to 60mcg/min.  Order to start vasopressin at 0.03/hr steady dose. And to change map goal to > 55.

## 2016-04-13 NOTE — Progress Notes (Signed)
[facility]  ROUNDING NOTE   Subjective:   [name] admitted to [facility] on 04/12/2016 for ESRD on dialysis [facility]) [N18.6, Z99.2] Sepsis, due to unspecified organism ([place]) [A41.9] Hypotension, unspecified hypotension type [I95.9] Pneumonia of left lung due to infectious organism, unspecified part of lung [J18.9]  Patient completed dialysis yesterday with no documented issues. Went back to his SNF where he was found hypotensive, unresponsive and hypoxic.   Patient diagnosed with sepsis and administered aztreonam, levofloxacin and vancomycin. He was also given 2 litre bolus of NS in ED. He is now on norepinephrine gtt.   Objective:  Vital signs in last 24 hours:  Temp:  [98.2 F (36.8 C)-101.7 F (38.7 C)] 98.2 F (36.8 C) (12/30 0800) Pulse Rate:  [63-112] 76 (12/30 1100) Resp:  [12-30] 19 (12/30 1100) BP: (68-157)/(25-100) 157/92 (12/30 1100) SpO2:  [92 %-100 %] 96 % (12/30 1100) Weight:  [72.6 kg (160 lb)-76.1 kg (167 lb 12.3 oz)] 76.1 kg (167 lb 12.3 oz) (12/30 0239)  Weight change:  Filed Weights   04/12/16 2109 04/13/16 0239  Weight: 72.6 kg (160 lb) 76.1 kg (167 lb 12.3 oz)    Intake/Output: I/O last 3 completed shifts: In: 2450 [IV Piggyback:2450] Out: -    Intake/Output this shift:  Total I/O In: 165.9 [I.V.:165.9] Out: -   Physical Exam: General: Critically ill, laying in bed  Head: Normocephalic, atraumatic. Moist oral mucosal membranes  Eyes: Anicteric, PERRL  Neck: Supple, trachea midline  Lungs:  Clear to auscultation  Heart: Regular rate and rhythm  Abdomen:  Soft, nontender,   Extremities: no peripheral edema.  Neurologic: Alert to self, moving all four extremities  Skin: No lesions  Access: Left arm AVF    Basic Metabolic Panel:  Recent Labs Lab 04/12/16 2238 04/13/16 0452  NA 139 136  K 4.4 4.6  CL 98* 99*  CO2 25 25  GLUCOSE 161* 240*  BUN 45* 46*  CREATININE 6.97* 6.77*  CALCIUM 8.5* 7.8*  MG  --  1.7     Liver Function Tests:  Recent Labs Lab 04/12/16 2238  AST 66*  ALT 70*  ALKPHOS 142*  BILITOT 1.2  PROT 7.5  ALBUMIN 3.5    Recent Labs Lab 04/12/16 2238  LIPASE 34   No results for input(s): AMMONIA in the last 168 hours.  CBC:  Recent Labs Lab 04/12/16 2238 04/13/16 0452  WBC 19.5* 23.6*  NEUTROABS 17.9*  --   HGB 11.2* 10.5*  HCT 34.2* 32.1*  MCV 94.7 94.2  PLT 192 215    Cardiac Enzymes:  Recent Labs Lab 04/12/16 2238  TROPONINI 0.08*    BNP: Invalid input(s): POCBNP  CBG:  Recent Labs Lab 04/13/16 0202 04/13/16 0328 04/13/16 0734  GLUCAP 153* 222* 201*    Microbiology: Results for orders placed or performed during the hospital encounter of 04/12/16  Blood Culture (routine x 2)     Status: None (Preliminary result)   Collection Time: 04/12/16  9:27 PM  Result Value Ref Range Status   Specimen Description BLOOD RIGHT WRIST  Final   Special Requests BOTTLES DRAWN AEROBIC AND ANAEROBIC 4CCAERO,4CCANA  Final   Culture NO GROWTH < 12 HOURS  Final   Report Status PENDING  Incomplete  Blood Culture (routine x 2)     Status: None (Preliminary result)   Collection Time: 04/12/16 10:37 PM  Result Value Ref Range Status   Specimen Description BLOOD CENTRAL LINE  Final   Special Requests BOTTLES DRAWN  AEROBIC AND ANAEROBIC 6CCAERO,3CCANA  Final   Culture NO GROWTH < 12 HOURS  Final   Report Status PENDING  Incomplete  MRSA PCR Screening     Status: None   Collection Time: 04/13/16  2:24 AM  Result Value Ref Range Status   MRSA by PCR NEGATIVE NEGATIVE Final    Comment:        The GeneXpert MRSA Assay (FDA approved for NASAL specimens only), is one component of a comprehensive MRSA colonization surveillance program. It is not intended to diagnose MRSA infection nor to guide or monitor treatment for MRSA infections.     Coagulation Studies:  Recent Labs  04/12/16 2238  LABPROT 13.8  INR 1.06    Urinalysis: No results for  input(s): COLORURINE, LABSPEC, PHURINE, GLUCOSEU, HGBUR, BILIRUBINUR, KETONESUR, PROTEINUR, UROBILINOGEN, NITRITE, LEUKOCYTESUR in the last 72 hours.  Invalid input(s): APPERANCEUR    Imaging: Dg Chest 1 View  Result Date: 04/12/2016 CLINICAL DATA:  Hypoxia, cough and fever. EXAM: CHEST 1 VIEW COMPARISON:  12/05/2015 FINDINGS: Mild cardiomegaly with tortuous atherosclerotic aorta. Patchy and confluent opacity in the left mid lower lung zone. Possible small bilateral pleural effusions. Crowding of bronchovascular structures with possible interstitial edema. Minimal right infrahilar atelectasis. No evidence of pneumothorax. The bones are under mineralized. IMPRESSION: 1. Patchy and confluent left mid lower lung zone opacity suspicious for pneumonia. Recommend continued follow-up with PA and lateral chest X-ray following trial of antibiotic therapy to ensure resolution and exclude underlying malignancy. 2. Cardiomegaly with possible interstitial edema and small pleural effusions. Electronically Signed   By: Jeb Levering M.D.   On: 04/12/2016 23:16   Dg Abdomen 1 View  Result Date: 04/12/2016 CLINICAL DATA:  Femoral central line placement. EXAM: ABDOMEN - 1 VIEW COMPARISON:  Radiographs 07/26/2014 FINDINGS: Right femoral catheter tip projects over the region of the right external/common iliac vein. Question submucosal edema involving the descending colon. No dilated bowel loops. Extensive vascular calcifications. No evidence of free air on single view. IMPRESSION: Tip of the right femoral catheter projects over the region of the right external/common iliac vein. Question submucosal edema about the descending colon, recommend correlation for colitis symptoms. Electronically Signed   By: Jeb Levering M.D.   On: 04/12/2016 23:14     Medications:   . norepinephrine (LEVOPHED) Adult infusion 15 mcg/min (04/13/16 1120)   . atorvastatin  20 mg Oral Daily  . aztreonam  500 mg Intravenous Q12H  .  gabapentin  100 mg Oral QHS  . heparin  5,000 Units Subcutaneous Q8H  . insulin aspart  2-6 Units Subcutaneous Q4H  . lamoTRIgine  100 mg Oral QHS  . levETIRAcetam  500 mg Oral Daily  . [START ON 04/14/2016] levofloxacin (LEVAQUIN) IV  500 mg Intravenous Q48H  . norepinephrine      . pantoprazole  40 mg Oral Daily  . sevelamer carbonate  800 mg Oral TID WC   sodium chloride, acetaminophen, docusate sodium, HYDROcodone-acetaminophen, ipratropium-albuterol, ondansetron (ZOFRAN) IV, vancomycin  Assessment/ Plan:  Mr. BRIGHAM WIDMAN is a 82 y.o. black male with end stage renal disease on hemodialysis, hypertension, hyperlipidemia, diabetes mellitus type II insulin dependent, seizure disorder, peripheral neuropathy and vascular dementia  [facility] [place] Left arm AVF  1. End Stage Renal Disease: hemodialysis yesterday. No acute indication for dialysis today. Will monitor daily for dialysis need  2. Sepsis: blood cultures without growth. Empiric vancomycin, levofloxacin and aztreonam. Source seems to be left lower lobe pneumonia. However patient denies pulmonary  symptoms - norepinephrine.   3. Anemia of chronic kidney disease: hemoglobin 10.5 Mircera as outpatient. Hold epo for now.   4. Diabetes Mellitus type II: insulin dependent - sliding scale insulin  5. Secondary Hyperparathyroidism: corrected calcium at goal.  - sevelamer ordered    LOS: 0 Herma Uballe, [place] 12/30/201711:29 AM

## 2016-04-13 NOTE — ED Notes (Signed)
Per spouse pt does not make urine anymore.

## 2016-04-13 NOTE — Progress Notes (Signed)
LCSW notified [place] weekend staff at [facility] Cell phone [phone] that patient has been admitted.  [employer] LCSW [phone]

## 2016-04-13 NOTE — Care Management (Signed)
RNCM consult received for medication assistance. I spoke with RN caring for patient to clarify assistance needed which is not currently known at this time but if discovered will notify this RNCM. Patient is from [facility]. CSW already following.

## 2016-04-13 NOTE — Progress Notes (Signed)
PHARMACY - PHYSICIAN COMMUNICATION CRITICAL VALUE ALERT - BLOOD CULTURE IDENTIFICATION (BCID)  Results for orders placed or performed during the hospital encounter of 04/12/16  Blood Culture ID Panel (Reflexed) (Collected: 04/12/2016  9:27 PM)  Result Value Ref Range   Enterococcus species DETECTED (A) NOT DETECTED   Vancomycin resistance NOT DETECTED NOT DETECTED   Listeria monocytogenes NOT DETECTED NOT DETECTED   Staphylococcus species NOT DETECTED NOT DETECTED   Staphylococcus aureus NOT DETECTED NOT DETECTED   Streptococcus species NOT DETECTED NOT DETECTED   Streptococcus agalactiae NOT DETECTED NOT DETECTED   Streptococcus pneumoniae NOT DETECTED NOT DETECTED   Streptococcus pyogenes NOT DETECTED NOT DETECTED   Acinetobacter baumannii NOT DETECTED NOT DETECTED   Enterobacteriaceae species NOT DETECTED NOT DETECTED   Enterobacter cloacae complex NOT DETECTED NOT DETECTED   Escherichia coli NOT DETECTED NOT DETECTED   Klebsiella oxytoca NOT DETECTED NOT DETECTED   Klebsiella pneumoniae NOT DETECTED NOT DETECTED   Proteus species NOT DETECTED NOT DETECTED   Serratia marcescens NOT DETECTED NOT DETECTED   Haemophilus influenzae NOT DETECTED NOT DETECTED   Neisseria meningitidis NOT DETECTED NOT DETECTED   Pseudomonas aeruginosa NOT DETECTED NOT DETECTED   Candida albicans NOT DETECTED NOT DETECTED   Candida glabrata NOT DETECTED NOT DETECTED   Candida krusei NOT DETECTED NOT DETECTED   Candida parapsilosis NOT DETECTED NOT DETECTED   Candida tropicalis NOT DETECTED NOT DETECTED    Name of physician (or Provider) Contacted: Dr. Madalyn Rob ( e-Link)   Changes to prescribed antibiotics required: No changes at this time  Pernell Dupre, PharmD, BCPS Clinical Pharmacist 04/13/2016 4:35 PM

## 2016-04-13 NOTE — H&P (Signed)
PULMONARY / CRITICAL CARE MEDICINE   Name: AVEYON NURSE MRN: [id number] DOB: 1930/02/28    ADMISSION DATE:  04/12/2016 CONSULTATION DATE:  04/12/16  REFERRING MD:  Dr. Karma Greaser  CHIEF COMPLAINT: Sepsis/ Hypotension  HISTORY OF PRESENT ILLNESS:   Michael Acosta is an 82 year old gentleman with PMH significant for CVA, Dementia, Depression, Diabetes Mellitus,ESRD, GERD, HTN ,PVD and Seizures.  Patient presented from  [facility] with fever, neck pain and hypotension post dialysis.  Patient was noted to have a SBP IN 70's, O2 sats were down to 70's.  Patient was placed on NRB and resuscitated with fluids.   CXR was concerning for  Left mid lower lobe PNA.Patient has responded well to fluids.  PCCM team was called to admit the patient.  PAST MEDICAL HISTORY :  He  has a past medical history of Anemia; CVA (cerebral infarction); Dementia; Depression; Diabetes mellitus without complication ([place]); Dialysis patient [place]); Dysrhythmia; Falls frequently; GERD (gastroesophageal reflux disease); Hypertension; Mood disorder ([place]); Neuropathy ([place]); Pain; Peripheral vascular disease ([place]); Prostate cancer ([place]); Renal disorder; Seizures ([place]); Stroke [facility]); and Weakness generalized.  PAST SURGICAL HISTORY: He  has a past surgical history that includes Total hip arthroplasty (Left); Leg Surgery (Left); Insertion of dialysis catheter; and Colon surgery.  Allergies  Allergen Reactions  . Aspirin   . Bc Powder [Aspirin-Salicylamide-Caffeine]   . 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    Current Outpatient Prescriptions on File Prior to Encounter  Medication Sig  . atorvastatin (LIPITOR) 20 MG tablet Take 20 mg by mouth daily.  [name] gabapentin (NEURONTIN) 100 MG capsule Take 100 mg by mouth at bedtime.  [name] HYDROcodone-acetaminophen (NORCO/VICODIN) 5-325 MG tablet Take 1 tablet by mouth every 6 (six) hours  as needed for moderate pain or severe pain.   [name] insulin aspart (NOVOLOG) 100 UNIT/ML injection Inject into the skin 4 (four) times daily -  before meals and at bedtime. SLIDING SCALE  . Insulin Detemir (LEVEMIR FLEXPEN) 100 UNIT/ML Pen Inject 15 Units into the skin at bedtime.   . levETIRAcetam (KEPPRA) 500 MG tablet Take 500 mg by mouth daily.  [name] omeprazole (PRILOSEC) 20 MG capsule Take 20 mg by mouth daily.   . ondansetron (ZOFRAN) 4 MG tablet Take 4 mg by mouth every 8 (eight) hours as needed for nausea or vomiting.     FAMILY HISTORY:  His has no family status information on file.    SOCIAL HISTORY: He  reports that he has never smoked. He has never used smokeless tobacco. He reports that he does not drink alcohol or use drugs.  REVIEW OF SYSTEMS:   Review of Systems  Constitutional: Positive for malaise/fatigue. Negative for chills, diaphoresis and weight loss.  HENT: Negative for congestion, ear discharge, nosebleeds and sinus pain.   Eyes: Negative for double vision, photophobia and pain.  Respiratory: Negative for cough, hemoptysis, shortness of breath, wheezing and stridor.   Cardiovascular: Positive for leg swelling. Negative for palpitations, orthopnea and claudication.  Gastrointestinal: Negative for abdominal pain, constipation, diarrhea and vomiting.  Genitourinary: Negative for frequency, hematuria and urgency.  Musculoskeletal: Positive for neck pain. Negative for back pain and joint pain.  Skin: Negative for itching and rash.  Neurological: Negative for tremors, sensory change, speech change and weakness.  Endo/Heme/Allergies: Negative for environmental allergies. Does not bruise/bleed easily.  Psychiatric/Behavioral: Negative for hallucinations and substance abuse. The patient is  not nervous/anxious.      SUBJECTIVE:  Patient states that "he has been feeling better now and is not short of breath"  VITAL SIGNS: BP 104/68   Pulse 96   Temp (!) 101.7 F (38.7 C)  (Oral)   Resp 19   Ht 5\' 8"  (1.727 m)   Wt 72.6 kg (160 lb)   SpO2 95%   BMI 24.33 kg/m   HEMODYNAMICS:    VENTILATOR SETTINGS:    INTAKE / OUTPUT: No intake/output data recorded.  PHYSICAL EXAMINATION: General:  Elderly AA gentleman, in no acute distress Neuro:  Awake, alert, oriented HEENT:  Atraumatic ,Normocephalic, no JVD appreciated Cardiovascular:  S1S2, Tachycardic, no MRG noted Lungs: Diminished bibasilar, no wheezes, crackles, rhonchi noted Abdomen: nontender with active bowel sounds Musculoskeletal:  Ischemic ulcer of right foot Skin:  Warm and dry  LABS:  BMET  Recent Labs Lab 04/12/16 2238  NA 139  K 4.4  CL 98*  CO2 25  BUN 45*  CREATININE 6.97*  GLUCOSE 161*    Electrolytes  Recent Labs Lab 04/12/16 2238  CALCIUM 8.5*    CBC  Recent Labs Lab 04/12/16 2238  WBC 19.5*  HGB 11.2*  HCT 34.2*  PLT 192    Coag's  Recent Labs Lab 04/12/16 2238  APTT 33  INR 1.06    Sepsis Markers  Recent Labs Lab 04/12/16 2127  LATICACIDVEN 1.7    ABG No results for input(s): PHART, PCO2ART, PO2ART in the last 168 hours.  Liver Enzymes  Recent Labs Lab 04/12/16 2238  AST 66*  ALT 70*  ALKPHOS 142*  BILITOT 1.2  ALBUMIN 3.5    Cardiac Enzymes  Recent Labs Lab 04/12/16 2238  TROPONINI 0.08*    Glucose No results for input(s): GLUCAP in the last 168 hours.  Imaging Dg Chest 1 View  Result Date: 04/12/2016 CLINICAL DATA:  Hypoxia, cough and fever. EXAM: CHEST 1 VIEW COMPARISON:  12/05/2015 FINDINGS: Mild cardiomegaly with tortuous atherosclerotic aorta. Patchy and confluent opacity in the left mid lower lung zone. Possible small bilateral pleural effusions. Crowding of bronchovascular structures with possible interstitial edema. Minimal right infrahilar atelectasis. No evidence of pneumothorax. The bones are under mineralized. IMPRESSION: 1. Patchy and confluent left mid lower lung zone opacity suspicious for pneumonia.  Recommend continued follow-up with PA and lateral chest X-ray following trial of antibiotic therapy to ensure resolution and exclude underlying malignancy. 2. Cardiomegaly with possible interstitial edema and small pleural effusions. Electronically Signed   By: Jeb Levering M.D.   On: 04/12/2016 23:16   Dg Abdomen 1 View  Result Date: 04/12/2016 CLINICAL DATA:  Femoral central line placement. EXAM: ABDOMEN - 1 VIEW COMPARISON:  Radiographs 07/26/2014 FINDINGS: Right femoral catheter tip projects over the region of the right external/common iliac vein. Question submucosal edema involving the descending colon. No dilated bowel loops. Extensive vascular calcifications. No evidence of free air on single view. IMPRESSION: Tip of the right femoral catheter projects over the region of the right external/common iliac vein. Question submucosal edema about the descending colon, recommend correlation for colitis symptoms. Electronically Signed   By: Jeb Levering M.D.   On: 04/12/2016 23:14     STUDIES:  None  CULTURES: 12/29 BC>> 12/29 UC>>  ANTIBIOTICS: 12/29 Aztreonam>> 12/29 Levofloxacin>> 12/29 Vancomycin>>  SIGNIFICANT EVENTS: 04/13/16>> Patient admitted to the ICU with sepsis secondary to PNA  LINES/TUBES: 12/29 Right Femoral>>    ASSESSMENT / PLAN:  PULMONARY A: Left  Mid lower lobe PNA Bilateral Pleural  effussions P:   Support with O2 to Keep sats>88% Duoneb  Aztreonam/Vancomycin/levofloxacin  Routine CXR  CARDIOVASCULAR A:  Hypotension Tachycardia Elevated troponin Hyperlipidemia P:  Continuous Telemetry Received 2250 ml of fluid in the ER Keep MAP>60 Continue Lipitor May need to start on pressors  RENAL A:   Acute on Chronic kidney disease P:   Dialysed on 12/29 Nephrology consult Replace electrolytes per ICU protocol Continue Renagel  GASTROINTESTINAL A:   Hx of GERD P:    continue Protonix EC  HEMATOLOGIC A:   PVD P:  Heparin for DVT  prophylaxis Transfuse per usual guidelines  INFECTIOUS A:   Sepsis secondary to PNA Leukocytosis P:   Monitor fever curve Follow cultures Antibiotics as above   ENDOCRINE A:   Diabetes Melitus P:   Blood glucose checks with SSI coverage  NEUROLOGIC A:   Hx of Seizures Hx of Stroke P:   Continue Keppra  Continue Lamictal Continue gabapentin    Amarah Brossman,AG-ACNP Pulmonary and Critical Care Medicine [facility]   04/13/2016, 12:27 AM

## 2016-04-14 DIAGNOSIS — J189 Pneumonia, unspecified organism: Secondary | ICD-10-CM

## 2016-04-14 LAB — BLOOD CULTURE ID PANEL (REFLEXED)
Acinetobacter baumannii: NOT DETECTED
Candida albicans: NOT DETECTED
Candida glabrata: NOT DETECTED
Candida krusei: NOT DETECTED
Candida parapsilosis: NOT DETECTED
Candida tropicalis: NOT DETECTED
ENTEROCOCCUS SPECIES: NOT DETECTED
ESCHERICHIA COLI: NOT DETECTED
Enterobacter cloacae complex: NOT DETECTED
Enterobacteriaceae species: NOT DETECTED
HAEMOPHILUS INFLUENZAE: NOT DETECTED
Klebsiella oxytoca: NOT DETECTED
Klebsiella pneumoniae: NOT DETECTED
LISTERIA MONOCYTOGENES: NOT DETECTED
Neisseria meningitidis: NOT DETECTED
PSEUDOMONAS AERUGINOSA: NOT DETECTED
Proteus species: NOT DETECTED
SERRATIA MARCESCENS: NOT DETECTED
STAPHYLOCOCCUS AUREUS BCID: NOT DETECTED
STREPTOCOCCUS AGALACTIAE: NOT DETECTED
STREPTOCOCCUS PNEUMONIAE: NOT DETECTED
STREPTOCOCCUS PYOGENES: NOT DETECTED
STREPTOCOCCUS SPECIES: NOT DETECTED
Staphylococcus species: NOT DETECTED

## 2016-04-14 LAB — RENAL FUNCTION PANEL
Albumin: 3.2 g/dL — ABNORMAL LOW (ref 3.5–5.0)
Anion gap: 15 (ref 5–15)
BUN: 66 mg/dL — ABNORMAL HIGH (ref 6–20)
CALCIUM: 8 mg/dL — AB (ref 8.9–10.3)
CHLORIDE: 97 mmol/L — AB (ref 101–111)
CO2: 22 mmol/L (ref 22–32)
CREATININE: 8.68 mg/dL — AB (ref 0.61–1.24)
GFR, EST AFRICAN AMERICAN: 6 mL/min — AB (ref >60)
GFR, EST NON AFRICAN AMERICAN: 5 mL/min — AB (ref >60)
Glucose, Bld: 244 mg/dL — ABNORMAL HIGH (ref 65–99)
Phosphorus: 4.6 mg/dL (ref 2.5–4.6)
Potassium: 4.6 mmol/L (ref 3.5–5.1)
Sodium: 134 mmol/L — ABNORMAL LOW (ref 135–145)

## 2016-04-14 LAB — GLUCOSE, CAPILLARY
GLUCOSE-CAPILLARY: 208 mg/dL — AB (ref 65–99)
GLUCOSE-CAPILLARY: 232 mg/dL — AB (ref 65–99)
Glucose-Capillary: 104 mg/dL — ABNORMAL HIGH (ref 65–99)

## 2016-04-14 NOTE — Progress Notes (Signed)
Report given to Katharine Look, RN who is now taking over patient's care. This RN discussed with patient the reason he does not need to get up out of bed while on 2 medications to support his blood pressure because of risk of his blood pressure dropping and falling.  Patient very disgruntled and is not agreeable stating a few swear words. Patient does not need to use the restroom at this time.  Bed alarm on and call bell in reach of patient.

## 2016-04-14 NOTE — H&P (Signed)
PULMONARY / CRITICAL CARE MEDICINE   Name: EFRIN Acosta MRN: [id number] DOB: 01-17-1930    ADMISSION DATE:  04/12/2016 CONSULTATION DATE:  04/12/16  REFERRING MD:  Dr. Karma Greaser  CHIEF COMPLAINT: Sepsis/ Hypotension  HISTORY OF PRESENT ILLNESS:   Michael Acosta is an 82 year old gentleman with PMH significant for CVA, Dementia, Depression, Diabetes Mellitus,ESRD, GERD, HTN ,PVD and Seizures.  Patient presented from  [facility] with fever, neck pain and hypotension post dialysis.  Patient was noted to have a SBP IN 70's, O2 sats were down to 70's.  Patient was placed on NRB and resuscitated with fluids.   CXR was concerning for  Left mid lower lobe PNA.Patient has responded well to fluids.  PCCM team was called to admit the patient.    Remains on vasopressors HD as needed per nephrology Will wean off vasopressors as tolerated     REVIEW OF SYSTEMS:   Review of Systems  Constitutional: Positive for malaise/fatigue. Negative for chills, diaphoresis and weight loss.  HENT: Negative for congestion, ear discharge, nosebleeds and sinus pain.   Eyes: Negative for double vision, photophobia and pain.  Respiratory: Negative for cough, hemoptysis, shortness of breath, wheezing and stridor.   Cardiovascular: Positive for leg swelling. Negative for palpitations, orthopnea and claudication.  Gastrointestinal: Negative for abdominal pain, constipation, diarrhea and vomiting.  Genitourinary: Negative for frequency, hematuria and urgency.  Musculoskeletal: Positive for neck pain. Negative for back pain and joint pain.  Skin: Negative for itching and rash.  Neurological: Negative for tremors, sensory change, speech change and weakness.  Endo/Heme/Allergies: Negative for environmental allergies. Does not bruise/bleed easily.  Psychiatric/Behavioral: Negative for hallucinations and substance abuse. The patient is not nervous/anxious.      SUBJECTIVE:  Patient states that "he has  been feeling better now and is not short of breath"  VITAL SIGNS: BP 94/63   Pulse 65   Temp 98.2 F (36.8 C) (Oral)   Resp 20   Ht 5\' 8"  (1.727 m)   Wt 172 lb 2.9 oz (78.1 kg)   SpO2 97%   BMI 26.18 kg/m    INTAKE / OUTPUT: I/O last 3 completed shifts: In: 3730.4 [P.O.:420; I.V.:700.4; Other:60; IV Piggyback:2550] Out: -   PHYSICAL EXAMINATION: General:  Elderly AA gentleman, in no acute distress Neuro:  Awake, alert, oriented HEENT:  Atraumatic ,Normocephalic, no JVD appreciated Cardiovascular:  S1S2, Tachycardic, no MRG noted Lungs: Diminished bibasilar, no wheezes, crackles, rhonchi noted Abdomen: nontender with active bowel sounds Musculoskeletal:  Ischemic ulcer of right foot Skin:  Warm and dry  LABS:  BMET  Recent Labs Lab 04/12/16 2238 04/13/16 0452  NA 139 136  K 4.4 4.6  CL 98* 99*  CO2 25 25  BUN 45* 46*  CREATININE 6.97* 6.77*  GLUCOSE 161* 240*    Electrolytes  Recent Labs Lab 04/12/16 2238 04/13/16 0452  CALCIUM 8.5* 7.8*  MG  --  1.7    CBC  Recent Labs Lab 04/12/16 2238 04/13/16 0452  WBC 19.5* 23.6*  HGB 11.2* 10.5*  HCT 34.2* 32.1*  PLT 192 215    Coag's  Recent Labs Lab 04/12/16 2238  APTT 33  INR 1.06    Sepsis Markers  Recent Labs Lab 04/12/16 2127  LATICACIDVEN 1.7    ABG No results for input(s): PHART, PCO2ART, PO2ART in the last 168 hours.  Liver Enzymes  Recent Labs Lab 04/12/16 2238  AST 66*  ALT 70*  ALKPHOS 142*  BILITOT 1.2  ALBUMIN  3.5    Cardiac Enzymes  Recent Labs Lab 04/12/16 2238  TROPONINI 0.08*    Glucose  Recent Labs Lab 04/13/16 0328 04/13/16 0734 04/13/16 1141 04/13/16 1558 04/13/16 2113 04/14/16 0701  GLUCAP 222* 201* 182* 232* 314* 208*    Imaging No results found.   STUDIES:  None  CULTURES: 12/29 BC>> 12/29 UC>>  ANTIBIOTICS: 12/29 Aztreonam>> 12/29 Levofloxacin>> 12/29 Vancomycin>>  SIGNIFICANT EVENTS: 04/13/16>> Patient admitted to  the ICU with sepsis secondary to PNA  LINES/TUBES: 12/29 Right Femoral>>    ASSESSMENT / PLAN: Septic shock with Left Lung pneumonia  PULMONARY A: Left  Mid lower lobe PNA Bilateral Pleural effussions P:   Support with O2 to Keep sats>88% Duoneb  Aztreonam/Vancomycin/levofloxacin  Routine CXR  CARDIOVASCULAR A:  Hypotension Tachycardia Elevated troponin Hyperlipidemia P:  Continuous Telemetry Received 2250 ml of fluid in the ER Keep MAP>60 Continue Lipitor Wean off vasopressors as tolerated MAP goal 55  RENAL A:   Acute on Chronic kidney disease P:   Dialysed on 12/29 Nephrology consult Replace electrolytes per ICU protocol Continue Renagel  GASTROINTESTINAL A:   Hx of GERD P:    continue Protonix EC  HEMATOLOGIC A:   PVD P:  Heparin for DVT prophylaxis Transfuse per usual guidelines  INFECTIOUS A:   Sepsis secondary to PNA Leukocytosis P:   Monitor fever curve Follow cultures Antibiotics as above   ENDOCRINE A:   Diabetes Melitus P:   Blood glucose checks with SSI coverage  NEUROLOGIC A:   Hx of Seizures Hx of Stroke P:   Continue Keppra  Continue Lamictal Continue gabapentin  I have personally obtained a history, examined the patient, evaluated Pertinent laboratory and Radio[employer]/imaging results, and  formulated the assessment and plan    Corrin Parker, M.D.  [facility]  Medical Director [place] Director [place] Department

## 2016-04-14 NOTE — Progress Notes (Signed)
[facility]  ROUNDING NOTE   Subjective:   On norepinephrine and vasopressin gtt this morning.  More alert and oriented.  Objective:  Vital signs in last 24 hours:  Temp:  [97.7 F (36.5 C)-98.9 F (37.2 C)] 98.5 F (36.9 C) (12/31 0900) Pulse Rate:  [62-89] 71 (12/31 1041) Resp:  [13-26] 21 (12/31 1041) BP: (62-165)/(26-138) 62/27 (12/31 1041) SpO2:  [90 %-100 %] 98 % (12/31 1041) Weight:  [78.1 kg (172 lb 2.9 oz)] 78.1 kg (172 lb 2.9 oz) (12/31 0500)  Weight change: 5.525 kg (12 lb 2.9 oz) Filed Weights   04/12/16 2109 04/13/16 0239 04/14/16 0500  Weight: 72.6 kg (160 lb) 76.1 kg (167 lb 12.3 oz) 78.1 kg (172 lb 2.9 oz)    Intake/Output: I/O last 3 completed shifts: In: 3730.4 [P.O.:420; I.V.:700.4; Other:60; IV Piggyback:2550] Out: -    Intake/Output this shift:  Total I/O In: 144.5 [P.O.:75; I.V.:69.5] Out: -   Physical Exam: General: Critically ill, laying in bed  Head: Normocephalic, atraumatic. Moist oral mucosal membranes  Eyes: Anicteric, PERRL  Neck: Supple, trachea midline  Lungs:  Clear to auscultation  Heart: Regular rate and rhythm  Abdomen:  Soft, nontender,   Extremities: no peripheral edema.  Neurologic: Alert x 3  Skin: No lesions  Access: Left arm AVF    Basic Metabolic Panel:  Recent Labs Lab 04/12/16 2238 04/13/16 0452 04/14/16 0937  NA 139 136 134*  K 4.4 4.6 4.6  CL 98* 99* 97*  CO2 25 25 22   GLUCOSE 161* 240* 244*  BUN 45* 46* 66*  CREATININE 6.97* 6.77* 8.68*  CALCIUM 8.5* 7.8* 8.0*  MG  --  1.7  --   PHOS  --   --  4.6    Liver Function Tests:  Recent Labs Lab 04/12/16 2238 04/14/16 0937  AST 66*  --   ALT 70*  --   ALKPHOS 142*  --   BILITOT 1.2  --   PROT 7.5  --   ALBUMIN 3.5 3.2*    Recent Labs Lab 04/12/16 2238  LIPASE 34   No results for input(s): AMMONIA in the last 168 hours.  CBC:  Recent Labs Lab 04/12/16 2238 04/13/16 0452  WBC 19.5* 23.6*  NEUTROABS 17.9*  --   HGB  11.2* 10.5*  HCT 34.2* 32.1*  MCV 94.7 94.2  PLT 192 215    Cardiac Enzymes:  Recent Labs Lab 04/12/16 2238  TROPONINI 0.08*    BNP: Invalid input(s): POCBNP  CBG:  Recent Labs Lab 04/13/16 0734 04/13/16 1141 04/13/16 1558 04/13/16 2113 04/14/16 0701  GLUCAP 201* 182* 232* 314* 208*    Microbiology: Results for orders placed or performed during the hospital encounter of 04/12/16  Blood Culture (routine x 2)     Status: None (Preliminary result)   Collection Time: 04/12/16  9:27 PM  Result Value Ref Range Status   Specimen Description BLOOD RIGHT WRIST  Final   Special Requests BOTTLES DRAWN AEROBIC AND ANAEROBIC 4CCAERO,4CCANA  Final   Culture  Setup Time   Final    GRAM POSITIVE COCCI IN BOTH AEROBIC AND ANAEROBIC BOTTLES CRITICAL RESULT CALLED TO, READ BACK BY AND VERIFIED WITH: SHEEMA HALLAJI ON 04/13/16 AT 1614 [facility]    Culture   Final    GRAM POSITIVE COCCI CULTURE REINCUBATED FOR BETTER GROWTH Performed at [facility]    Report Status PENDING  Incomplete  Blood Culture ID Panel (Reflexed)     Status: Abnormal   Collection  Time: 04/12/16  9:27 PM  Result Value Ref Range Status   Enterococcus species DETECTED (A) NOT DETECTED Final    Comment: CRITICAL RESULT CALLED TO, READ BACK BY AND VERIFIED WITH: SHEEMA HALLAJI ON 04/13/16 AT 1614 [place]    Vancomycin resistance NOT DETECTED NOT DETECTED Final   Listeria monocytogenes NOT DETECTED NOT DETECTED Final   Staphylococcus species NOT DETECTED NOT DETECTED Final   Staphylococcus aureus NOT DETECTED NOT DETECTED Final   Streptococcus species NOT DETECTED NOT DETECTED Final   Streptococcus agalactiae NOT DETECTED NOT DETECTED Final   Streptococcus pneumoniae NOT DETECTED NOT DETECTED Final   Streptococcus pyogenes NOT DETECTED NOT DETECTED Final   Acinetobacter baumannii NOT DETECTED NOT DETECTED Final   Enterobacteriaceae species NOT DETECTED NOT DETECTED Final   Enterobacter cloacae complex NOT  DETECTED NOT DETECTED Final   Escherichia coli NOT DETECTED NOT DETECTED Final   Klebsiella oxytoca NOT DETECTED NOT DETECTED Final   Klebsiella pneumoniae NOT DETECTED NOT DETECTED Final   Proteus species NOT DETECTED NOT DETECTED Final   Serratia marcescens NOT DETECTED NOT DETECTED Final   Haemophilus influenzae NOT DETECTED NOT DETECTED Final   Neisseria meningitidis NOT DETECTED NOT DETECTED Final   Pseudomonas aeruginosa NOT DETECTED NOT DETECTED Final   Candida albicans NOT DETECTED NOT DETECTED Final   Candida glabrata NOT DETECTED NOT DETECTED Final   Candida krusei NOT DETECTED NOT DETECTED Final   Candida parapsilosis NOT DETECTED NOT DETECTED Final   Candida tropicalis NOT DETECTED NOT DETECTED Final  Blood Culture (routine x 2)     Status: None (Preliminary result)   Collection Time: 04/12/16 10:37 PM  Result Value Ref Range Status   Specimen Description BLOOD CENTRAL LINE  Final   Special Requests BOTTLES DRAWN AEROBIC AND ANAEROBIC 6CCAERO,3CCANA  Final   Culture NO GROWTH 2 DAYS  Final   Report Status PENDING  Incomplete  MRSA PCR Screening     Status: None   Collection Time: 04/13/16  2:24 AM  Result Value Ref Range Status   MRSA by PCR NEGATIVE NEGATIVE Final    Comment:        The GeneXpert MRSA Assay (FDA approved for NASAL specimens only), is one component of a comprehensive MRSA colonization surveillance program. It is not intended to diagnose MRSA infection nor to guide or monitor treatment for MRSA infections.     Coagulation Studies:  Recent Labs  04/12/16 2238  LABPROT 13.8  INR 1.06    Urinalysis: No results for input(s): COLORURINE, LABSPEC, PHURINE, GLUCOSEU, HGBUR, BILIRUBINUR, KETONESUR, PROTEINUR, UROBILINOGEN, NITRITE, LEUKOCYTESUR in the last 72 hours.  Invalid input(s): APPERANCEUR    Imaging: Dg Chest 1 View  Result Date: 04/12/2016 CLINICAL DATA:  Hypoxia, cough and fever. EXAM: CHEST 1 VIEW COMPARISON:  12/05/2015  FINDINGS: Mild cardiomegaly with tortuous atherosclerotic aorta. Patchy and confluent opacity in the left mid lower lung zone. Possible small bilateral pleural effusions. Crowding of bronchovascular structures with possible interstitial edema. Minimal right infrahilar atelectasis. No evidence of pneumothorax. The bones are under mineralized. IMPRESSION: 1. Patchy and confluent left mid lower lung zone opacity suspicious for pneumonia. Recommend continued follow-up with PA and lateral chest X-ray following trial of antibiotic therapy to ensure resolution and exclude underlying malignancy. 2. Cardiomegaly with possible interstitial edema and small pleural effusions. Electronically Signed   By: Jeb Levering M.D.   On: 04/12/2016 23:16   Dg Abdomen 1 View  Result Date: 04/12/2016 CLINICAL DATA:  Femoral central line placement. EXAM: ABDOMEN -  1 VIEW COMPARISON:  Radiographs 07/26/2014 FINDINGS: Right femoral catheter tip projects over the region of the right external/common iliac vein. Question submucosal edema involving the descending colon. No dilated bowel loops. Extensive vascular calcifications. No evidence of free air on single view. IMPRESSION: Tip of the right femoral catheter projects over the region of the right external/common iliac vein. Question submucosal edema about the descending colon, recommend correlation for colitis symptoms. Electronically Signed   By: Jeb Levering M.D.   On: 04/12/2016 23:14     Medications:   . norepinephrine (LEVOPHED) Adult infusion 9 mcg/min (04/14/16 1000)  . vasopressin (PITRESSIN) infusion - *FOR SHOCK* 0.03 Units/min (04/14/16 0500)   . atorvastatin  20 mg Oral Daily  . aztreonam  500 mg Intravenous Q12H  . gabapentin  100 mg Oral QHS  . heparin  5,000 Units Subcutaneous Q8H  . hydrocortisone sod succinate (SOLU-CORTEF) inj  50 mg Intravenous Q6H  . insulin aspart  0-15 Units Subcutaneous TID WC  . insulin aspart  0-5 Units Subcutaneous QHS  .  lamoTRIgine  100 mg Oral QHS  . levETIRAcetam  500 mg Oral Daily  . levofloxacin (LEVAQUIN) IV  500 mg Intravenous Q48H  . pantoprazole  40 mg Oral Daily  . sevelamer carbonate  800 mg Oral TID WC   sodium chloride, acetaminophen, docusate sodium, HYDROcodone-acetaminophen, ipratropium-albuterol, ondansetron (ZOFRAN) IV, vancomycin  Assessment/ Plan:  [name] is a 82 y.o. black male with end stage renal disease on hemodialysis, hypertension, hyperlipidemia, diabetes mellitus type II insulin dependent, seizure disorder, peripheral neuropathy and vascular dementia  MWF [facility] [place] Left arm AVF  1. End Stage Renal Disease: hemodialysis Friday 12/29. No acute indication for dialysis today. Will monitor daily for dialysis need. Hopefully will be off vasopressors by tomorrow and may proceed with intermittent HD. Low threshold for CVVHD  2. Sepsis: Enterobacter species Empiric vancomycin, levofloxacin and aztreonam. - norepinephrine and vasopressin  3. Anemia of chronic kidney disease: hemoglobin 10.5 Mircera as outpatient. Hold epo for now.   4. Diabetes Mellitus type II: insulin dependent - Continue glucose control.   5. Secondary Hyperparathyroidism: corrected calcium at goal.  - sevelamer    LOS: Scissors, [employer] 12/31/201711:09 AM

## 2016-04-15 ENCOUNTER — Inpatient Hospital Stay: Admit: 2016-04-15 | Payer: Medicare Other

## 2016-04-15 ENCOUNTER — Inpatient Hospital Stay
Admit: 2016-04-15 | Discharge: 2016-04-15 | Disposition: A | Discharge status: Home / Self Care | Payer: Medicare Other | Attending: Infectious Diseases | Admitting: Infectious Diseases

## 2016-04-15 LAB — GLUCOSE, CAPILLARY
GLUCOSE-CAPILLARY: 118 mg/dL — AB (ref 65–99)
GLUCOSE-CAPILLARY: 162 mg/dL — AB (ref 65–99)
Glucose-Capillary: 220 mg/dL — ABNORMAL HIGH (ref 65–99)
Glucose-Capillary: 147 mg/dL — ABNORMAL HIGH (ref 65–99)
Glucose-Capillary: 157 mg/dL — ABNORMAL HIGH (ref 65–99)

## 2016-04-15 MED ORDER — ALBUMIN HUMAN 25 % IV SOLN
12.5000 g | Freq: Once | INTRAVENOUS | Status: DC
  Filled 2016-04-15: qty 50

## 2016-04-15 MED ORDER — VANCOMYCIN HCL IN DEXTROSE 750-5 MG/150ML-% IV SOLN
750.0000 mg | INTRAVENOUS | Status: DC
  Administered 2016-04-15 – 2016-04-17 (×2): 750 mg via INTRAVENOUS
  Filled 2016-04-15 (×3): qty 150

## 2016-04-15 MED ORDER — MIDODRINE HCL 5 MG PO TABS
5.0000 mg | ORAL_TABLET | Freq: Three times a day (TID) | ORAL | Status: DC
  Administered 2016-04-15 – 2016-04-18 (×8): 5 mg via ORAL
  Filled 2016-04-15 (×9): qty 1

## 2016-04-15 MED ORDER — MIDODRINE HCL 5 MG PO TABS
5.0000 mg | ORAL_TABLET | ORAL | Status: AC
  Administered 2016-04-15: 5 mg via ORAL
  Filled 2016-04-15: qty 1

## 2016-04-15 NOTE — Progress Notes (Signed)
Pre HD assessment  

## 2016-04-15 NOTE — Progress Notes (Signed)
Pre HD, pt arrived in unit, stable.

## 2016-04-15 NOTE — Progress Notes (Signed)
*  PRELIMINARY RESULTS* Echocardiogram 2D Echocardiogram has been performed.  Michael Acosta 04/15/2016, 3:36 PM

## 2016-04-15 NOTE — NC FL2 (Addendum)
[place] LEVEL OF CARE SCREENING TOOL     IDENTIFICATION  Patient Name: Michael Acosta Birthdate: Sep 13, 1929 Sex: male Admission Date (Current Location): 04/12/2016  [place] and [place] Number:  Engineering geologist and Address:  [facility], [address], [place], [place] [address]      Provider Number: [id number]  Attending Physician Name and Address:  Flora Lipps, MD  Relative Name and Phone Number:       Current Level of Care: Hospital Recommended Level of Care: [place] Prior Approval Number:    Date Approved/Denied:   PASRR Number:    Discharge Plan: SNF    Current Diagnoses: Patient Active Problem List   Diagnosis Date Noted  . CAP (community acquired pneumonia) 04/13/2016    Orientation RESPIRATION BLADDER Height & Weight     Self, Place  Normal Incontinent Weight: 166 lb 7.2 oz (75.5 kg) Height:  5\' 8"  (172.7 cm)  BEHAVIORAL SYMPTOMS/MOOD NEUROLOGICAL BOWEL NUTRITION STATUS   (none) Convulsions/Seizures Incontinent Diet (renal diet)  AMBULATORY STATUS COMMUNICATION OF NEEDS Skin   Extensive Assist Verbally Normal                       Personal Care Assistance Level of Assistance  Bathing, Dressing Bathing Assistance: Maximum assistance   Dressing Assistance: Maximum assistance     Functional Limitations Info   (no issues)          SPECIAL CARE FACTORS FREQUENCY                       Contractures Contractures Info: Not present    Additional Factors Info  Code Status Code Status Info: full             Current Medications (04/15/2016):  This is the current hospital active medication list Current Facility-Administered Medications  Medication Dose Route Frequency Provider Last Rate Last Dose  . 0.9 %  sodium chloride infusion  250 mL Intravenous PRN Bincy S Varughese, NP      . acetaminophen (TYLENOL) tablet 650 mg  650 mg Oral Q4H PRN Bincy S Varughese, NP      .  atorvastatin (LIPITOR) tablet 20 mg  20 mg Oral Daily Bincy S Varughese, NP   20 mg at 04/15/16 0932  . docusate sodium (COLACE) capsule 100 mg  100 mg Oral BID PRN Bincy S Varughese, NP      . gabapentin (NEURONTIN) capsule 100 mg  100 mg Oral QHS Bincy S Varughese, NP   100 mg at 04/14/16 2111  . heparin injection 5,000 Units  5,000 Units Subcutaneous Q8H Bincy S Varughese, NP   5,000 Units at 04/15/16 1535  . HYDROcodone-acetaminophen (NORCO/VICODIN) 5-325 MG per tablet 1 tablet  1 tablet Oral Q6H PRN Bincy S Varughese, NP      . hydrocortisone sodium succinate (SOLU-CORTEF) 100 MG injection 50 mg  50 mg Intravenous Q6H Flora Lipps, MD   50 mg at 04/15/16 1534  . insulin aspart (novoLOG) injection 0-15 Units  0-15 Units Subcutaneous TID WC Bincy S Varughese, NP   3 Units at 04/15/16 1535  . insulin aspart (novoLOG) injection 0-5 Units  0-5 Units Subcutaneous QHS Bincy S Varughese, NP      . ipratropium-albuterol (DUONEB) 0.5-2.5 (3) MG/3ML nebulizer solution 3 mL  3 mL Nebulization Q4H PRN Bincy S Varughese, NP      . lamoTRIgine (LAMICTAL) tablet 100 mg  100 mg Oral  QHS Holley Raring, NP   100 mg at 04/14/16 2111  . levETIRAcetam (KEPPRA) tablet 500 mg  500 mg Oral Daily Bincy S Varughese, NP   500 mg at 04/15/16 0931  . levofloxacin (LEVAQUIN) IVPB 500 mg  500 mg Intravenous Q48H Hinda Kehr, MD   500 mg at 04/14/16 1710  . midodrine (PROAMATINE) tablet 5 mg  5 mg Oral TID WC Lavonia Dana, MD      . norepinephrine (LEVOPHED) 16 mg in dextrose 5 % 250 mL (0.064 mg/mL) infusion  2-50 mcg/min Intravenous Continuous Flora Lipps, MD   Stopped at 04/14/16 1215  . ondansetron (ZOFRAN) injection 4 mg  4 mg Intravenous Q6H PRN Bincy S Varughese, NP      . pantoprazole (PROTONIX) EC tablet 40 mg  40 mg Oral Daily Bincy S Varughese, NP   40 mg at 04/15/16 0932  . sevelamer carbonate (RENVELA) tablet 800 mg  800 mg Oral TID WC Bincy S Varughese, NP   800 mg at 04/15/16 0835  . vancomycin (VANCOCIN)  IVPB 750 mg/150 ml premix  750 mg Intravenous Q M,W,F-HD Lenis Noon, [facility]      . vasopressin (PITRESSIN) 40 Units in sodium chloride 0.9 % 250 mL (0.16 Units/mL) infusion  0.03 Units/min Intravenous Continuous Flora Lipps, MD   Stopped at 04/14/16 1300     Discharge Medications: Please see discharge summary for a list of discharge medications.  Relevant Imaging Results:  Relevant Lab Results:   Additional Information    Shela Leff, LCSW

## 2016-04-15 NOTE — Progress Notes (Signed)
Post HD assessment  

## 2016-04-15 NOTE — Progress Notes (Signed)
Called for report. Pt having breakfast. Floor will transport pt to HD after he finishes.

## 2016-04-15 NOTE — Progress Notes (Signed)
HD tx started  

## 2016-04-15 NOTE — Progress Notes (Signed)
[facility] [place] NAME: Pierino Tomlins    MR#:  [id number]  DATE OF BIRTH:  03/11/1930  SUBJECTIVE: Admitted for generalized weakness, found to have hypotension, hypoxia, pneumonia and admitted to ICU.   seen hemodialysis, alert awake oriented denies shortness of breath or chest pain.   CHIEF COMPLAINT:   Chief Complaint  Patient presents with  . Hypotension  . Altered Mental Status    REVIEW OF SYSTEMS:   ROS CONSTITUTIONAL: No fever, fatigue or weakness.  EYES: No blurred or double vision.  EARS, NOSE, AND THROAT: No tinnitus or ear pain.  RESPIRATORY: No cough, shortness of breath, wheezing or hemoptysis.  CARDIOVASCULAR: No chest pain, orthopnea, edema.  GASTROINTESTINAL: No nausea, vomiting, diarrhea or abdominal pain.  GENITOURINARY: No dysuria, hematuria.  ENDOCRINE: No polyuria, nocturia,  HEMATOLOGY: No anemia, easy bruising or bleeding SKIN: No rash or lesion. MUSCULOSKELETAL: No joint pain or arthritis.   NEUROLOGIC: No tingling, numbness, weakness.  PSYCHIATRY: No anxiety or depression.   DRUG ALLERGIES:   Allergies  Allergen Reactions  . Aspirin   . Bc Powder [Aspirin-Salicylamide-Caffeine]   . Penicillins Other (See Comments)    MADE HER BLIND    VITALS:  Blood pressure (!) 116/59, pulse 80, temperature 98.2 F (36.8 C), temperature source Oral, resp. rate 18, height 5\' 8"  (1.727 m), weight 75.5 kg (166 lb 7.2 oz), SpO2 95 %.  PHYSICAL EXAMINATION:  GENERAL:  83 y.o.-year-old patient lying in the bed with no acute distress.  EYES: Pupils equal, round, reactive to light and accommodation. No scleral icterus. Extraocular muscles intact.  HEENT: Head atraumatic, normocephalic. Oropharynx and nasopharynx clear.  NECK:  Supple, no jugular venous distention. No thyroid enlargement, no tenderness.  LUNGS: Normal breath sounds bilaterally, no wheezing, rales,rhonchi or crepitation. No use of accessory  muscles of respiration.  CARDIOVASCULAR: S1, S2 normal. No murmurs, rubs, or gallops.  ABDOMEN: Soft, nontender, nondistended. Bowel sounds present. No organomegaly or mass.  EXTREMITIES: No pedal edema, cyanosis, or clubbing.  NEUROLOGIC: Cranial nerves II through XII are intact. Muscle strength 5/5 in all extremities. Sensation intact. Gait not checked.  PSYCHIATRIC: The patient is alert and oriented x 3.  SKIN: No obvious rash, lesion, or ulcer.    LABORATORY PANEL:   CBC  Recent Labs Lab 04/13/16 0452  WBC 23.6*  HGB 10.5*  HCT 32.1*  PLT 215   ------------------------------------------------------------------------------------------------------------------  Chemistries   Recent Labs Lab 04/12/16 2238 04/13/16 0452 04/14/16 0937  NA 139 136 134*  K 4.4 4.6 4.6  CL 98* 99* 97*  CO2 25 25 22   GLUCOSE 161* 240* 244*  BUN 45* 46* 66*  CREATININE 6.97* 6.77* 8.68*  CALCIUM 8.5* 7.8* 8.0*  MG  --  1.7  --   AST 66*  --   --   ALT 70*  --   --   ALKPHOS 142*  --   --   BILITOT 1.2  --   --    ------------------------------------------------------------------------------------------------------------------  Cardiac Enzymes  Recent Labs Lab 04/12/16 2238  TROPONINI 0.08*   ------------------------------------------------------------------------------------------------------------------  RADIOLOGY:  No results found.  EKG:   Orders placed or performed during the hospital encounter of 04/12/16  . EKG 12-Lead  . EKG 12-Lead    ASSESSMENT AND PLAN:    Sepsis with hypotension with Enterobacter and the blood: Continue vancomycin, Levaquin, Azactam. Patient is off the pressors. Blood pressure is holding without any pressors.  #2 ESRD on hemodialysis patient  started on midodrine for hypotension. Also received IV albumin with hemodialysis today.  #3 diabetes mellitus type 2: Insulin-dependent: Continue current medications  #4. Hypoxia on admission:  Improved. Now 100% saturation on room air.  #5.essential hypertension ;now hypotensive  History of seizures or depression: Continue Lamictal, Keppra   DVT prophylaxis.     All the records are reviewed and case discussed with Care Management/Social Workerr. Management plans discussed with the patient, family and they are in agreement.  CODE STATUS: full  TOTAL TIME TAKING CARE OF THIS PATIENT: 35 minutes.   POSSIBLE D/C IN 1-2DAYS, DEPENDING ON CLINICAL CONDITION.   Epifanio Lesches M.D on 04/15/2016 at 1:27 PM  Between 7am to 6pm - Pager - [phone]  After 6pm go to [email or web address] - password [email or web address] [place] [facility]  Office  [phone]  CC: Primary care physician; Marden Noble, MD   Note: This dictation was prepared with Dragon dictation along with smaller phrase technology. Any transcriptional errors that result from this process are unintentional.

## 2016-04-15 NOTE — H&P (Signed)
PULMONARY / CRITICAL CARE MEDICINE   Name: Michael Acosta MRN: [id number] DOB: [birth year]    ADMISSION DATE:  04/12/2016 CONSULTATION DATE:  04/12/16  REFERRING MD:  Dr. Karma Greaser  CHIEF COMPLAINT: Sepsis/ Hypotension  HISTORY OF PRESENT ILLNESS:   Michael Acosta is an 83 year old gentleman with PMH significant for CVA, Dementia, Depression, Diabetes Mellitus,ESRD, GERD, HTN ,PVD and Seizures.  Patient presented from  [facility] with fever, neck pain and hypotension post dialysis.  Patient was noted to have a SBP IN 70's, O2 sats were down to 70's.  Patient was placed on NRB and resuscitated with fluids.   CXR was concerning for  Left mid lower lobe PNA.Patient has responded well to fluids.  PCCM team was called to admit the patient.   HPI:   off vasopressors HD as needed per nephrology 1/2 blood cultures POS for enterococcus      REVIEW OF SYSTEMS:   Review of Systems  Constitutional: Negative for chills, diaphoresis, malaise/fatigue and weight loss.  HENT: Negative for congestion.   Eyes: Negative for double vision, photophobia and pain.  Respiratory: Negative for cough, hemoptysis, shortness of breath and wheezing.   Cardiovascular: Negative for palpitations, orthopnea, claudication and leg swelling.  Gastrointestinal: Negative for abdominal pain, constipation, diarrhea and vomiting.  Musculoskeletal: Negative for back pain, joint pain and neck pain.  Skin: Negative for itching and rash.  Neurological: Negative for weakness.     SUBJECTIVE:  Patient states that "he has been feeling better now and is not short of breath" Off vasopressors ID following  VITAL SIGNS: BP (!) 75/56   Pulse 75   Temp 97.6 F (36.4 C) (Oral)   Resp 15   Ht 5\' 8"  (1.727 m)   Wt 166 lb 7.2 oz (75.5 kg)   SpO2 97%   BMI 25.31 kg/m    INTAKE / OUTPUT: I/O last 3 completed shifts: In: 702 [P.O.:255; I.V.:317; Other:80; IV Piggyback:50] Out: -   PHYSICAL  EXAMINATION: General:  Elderly AA gentleman, in no acute distress Neuro:  Awake, alert, oriented HEENT:  Atraumatic ,Normocephalic, no JVD appreciated Cardiovascular:  S1S2, Tachycardic, no MRG noted Lungs: Diminished bibasilar, no wheezes, crackles, rhonchi noted Abdomen: nontender with active bowel sounds Musculoskeletal:  Ischemic ulcer of right foot Skin:  Warm and dry  LABS:  BMET  Recent Labs Lab 04/12/16 2238 04/13/16 0452 04/14/16 0937  NA 139 136 134*  K 4.4 4.6 4.6  CL 98* 99* 97*  CO2 25 25 22   BUN 45* 46* 66*  CREATININE 6.97* 6.77* 8.68*  GLUCOSE 161* 240* 244*    Electrolytes  Recent Labs Lab 04/12/16 2238 04/13/16 0452 04/14/16 0937  CALCIUM 8.5* 7.8* 8.0*  MG  --  1.7  --   PHOS  --   --  4.6    CBC  Recent Labs Lab 04/12/16 2238 04/13/16 0452  WBC 19.5* 23.6*  HGB 11.2* 10.5*  HCT 34.2* 32.1*  PLT 192 215    Coag's  Recent Labs Lab 04/12/16 2238  APTT 33  INR 1.06    Sepsis Markers  Recent Labs Lab 04/12/16 2127  LATICACIDVEN 1.7    ABG No results for input(s): PHART, PCO2ART, PO2ART in the last 168 hours.  Liver Enzymes  Recent Labs Lab 04/12/16 2238 04/14/16 0937  AST 66*  --   ALT 70*  --   ALKPHOS 142*  --   BILITOT 1.2  --   ALBUMIN 3.5 3.2*    Cardiac  Enzymes  Recent Labs Lab 04/12/16 2238  TROPONINI 0.08*    Glucose  Recent Labs Lab 04/13/16 1558 04/13/16 2113 04/14/16 0701 04/14/16 1137 04/14/16 1619 04/15/16 0723  GLUCAP 232* 314* 208* 232* 104* 220*    Imaging No results found.   STUDIES:  None  CULTURES: 12/29 BC>> 12/29 UC>>  ANTIBIOTICS: 12/29 Aztreonam>>12/31 12/29 Levofloxacin>>12/31 12/29 Vancomycin>> 12/31-levofloxacin>>   SIGNIFICANT EVENTS: 04/13/16>> Patient admitted to the ICU with sepsis secondary to PNA  LINES/TUBES: 12/29 Right Femoral>>    ASSESSMENT / PLAN: Septic shock with Left Lung pneumonia and enterococcal  bacteremie  PULMONARY A: Left  Mid lower lobe PNA Bilateral Pleural effussions P:   Support with O2 to Keep sats>88% Duoneb  Follow up ID recs  CARDIOVASCULAR A:  Hypotension Tachycardia Elevated troponin Hyperlipidemia P:  Continuous Telemetry Received 2250 ml of fluid in the ER Continue Lipitor Wean off vasopressors as tolerated MAP goal 55  RENAL A:   Acute on Chronic kidney disease P:   Dialysed on 12/29 Nephrology consult Replace electrolytes per ICU protocol Continue Renagel  GASTROINTESTINAL A:   Hx of GERD P:    continue Protonix EC  HEMATOLOGIC A:   PVD P:  Heparin for DVT prophylaxis Transfuse per usual guidelines  INFECTIOUS A:   Sepsis secondary to PNA Leukocytosis P:   Monitor fever curve Follow cultures Antibiotics as per ID   ENDOCRINE A:   Diabetes Melitus P:   Blood glucose checks with SSI coverage  NEUROLOGIC A:   Hx of Seizures Hx of Stroke P:   Continue Keppra  Continue Lamictal Continue gabapentin  I have personally obtained a history, examined the patient, evaluated Pertinent laboratory and RadioGraphic/imaging results, and  formulated the assessment and plan   I have discussed case with Dr. Marcille Blanco patient is step down status and can be transferred to [facility] ist service.    Corrin Parker, M.D.  [facility]  Medical Director [place] Director [facility] Cardio-Pulmonary Department

## 2016-04-15 NOTE — Progress Notes (Signed)
[facility]  ROUNDING NOTE   Subjective:   OFF norepinephrine and vasopressin gtt this morning.  More alert and oriented. Blood pressure low but taken from his leg  Objective:  Vital signs in last 24 hours:  Temp:  [97.4 F (36.3 C)-98.5 F (36.9 C)] 97.6 F (36.4 C) (01/01 0800) Pulse Rate:  [64-76] 70 (01/01 0800) Resp:  [8-26] 19 (01/01 0800) BP: (62-132)/(27-90) 89/50 (01/01 0800) SpO2:  [94 %-100 %] 97 % (01/01 0800) Weight:  [75.5 kg (166 lb 7.2 oz)] 75.5 kg (166 lb 7.2 oz) (01/01 0426)  Weight change: -2.6 kg (-5 lb 11.7 oz) Filed Weights   04/13/16 0239 04/14/16 0500 04/15/16 0426  Weight: 76.1 kg (167 lb 12.3 oz) 78.1 kg (172 lb 2.9 oz) 75.5 kg (166 lb 7.2 oz)    Intake/Output: I/O last 3 completed shifts: In: 702 [P.O.:255; I.V.:317; Other:80; IV Piggyback:50] Out: -    Intake/Output this shift:  No intake/output data recorded.  Physical Exam: General: Ill appearing, laying in bed  Head: Normocephalic, atraumatic. Moist oral mucosal membranes  Eyes: Anicteric, PERRL  Neck: Supple, trachea midline  Lungs:  Clear to auscultation  Heart: Regular rate and rhythm  Abdomen:  Soft, nontender,   Extremities: no peripheral edema.  Neurologic: Alert x 3  Skin: No lesions  Access: Left arm AVF    Basic Metabolic Panel:  Recent Labs Lab 04/12/16 2238 04/13/16 0452 04/14/16 0937  NA 139 136 134*  K 4.4 4.6 4.6  CL 98* 99* 97*  CO2 25 25 22   GLUCOSE 161* 240* 244*  BUN 45* 46* 66*  CREATININE 6.97* 6.77* 8.68*  CALCIUM 8.5* 7.8* 8.0*  MG  --  1.7  --   PHOS  --   --  4.6    Liver Function Tests:  Recent Labs Lab 04/12/16 2238 04/14/16 0937  AST 66*  --   ALT 70*  --   ALKPHOS 142*  --   BILITOT 1.2  --   PROT 7.5  --   ALBUMIN 3.5 3.2*    Recent Labs Lab 04/12/16 2238  LIPASE 34   No results for input(s): AMMONIA in the last 168 hours.  CBC:  Recent Labs Lab 04/12/16 2238 04/13/16 0452  WBC 19.5* 23.6*   NEUTROABS 17.9*  --   HGB 11.2* 10.5*  HCT 34.2* 32.1*  MCV 94.7 94.2  PLT 192 215    Cardiac Enzymes:  Recent Labs Lab 04/12/16 2238  TROPONINI 0.08*    BNP: Invalid input(s): POCBNP  CBG:  Recent Labs Lab 04/13/16 2113 04/14/16 0701 04/14/16 1137 04/14/16 1619 04/15/16 0723  GLUCAP 314* 208* 232* 104* 220*    Microbiology: Results for orders placed or performed during the hospital encounter of 04/12/16  Blood Culture (routine x 2)     Status: None (Preliminary result)   Collection Time: 04/12/16  9:27 PM  Result Value Ref Range Status   Specimen Description BLOOD RIGHT WRIST  Final   Special Requests BOTTLES DRAWN AEROBIC AND ANAEROBIC 4CCAERO,4CCANA  Final   Culture  Setup Time   Final    GRAM POSITIVE COCCI IN BOTH AEROBIC AND ANAEROBIC BOTTLES CRITICAL RESULT CALLED TO, READ BACK BY AND VERIFIED WITH: SHEEMA HALLAJI ON 04/13/16 AT 1614 [facility]    Culture   Final    GRAM POSITIVE COCCI CULTURE REINCUBATED FOR BETTER GROWTH Performed at [facility]    Report Status PENDING  Incomplete  Blood Culture ID Panel (Reflexed)  Status: Abnormal   Collection Time: 04/12/16  9:27 PM  Result Value Ref Range Status   Enterococcus species DETECTED (A) NOT DETECTED Final    Comment: CRITICAL RESULT CALLED TO, READ BACK BY AND VERIFIED WITH: SHEEMA HALLAJI ON 04/13/16 AT 1614 [place]    Vancomycin resistance NOT DETECTED NOT DETECTED Final   Listeria monocytogenes NOT DETECTED NOT DETECTED Final   Staphylococcus species NOT DETECTED NOT DETECTED Final   Staphylococcus aureus NOT DETECTED NOT DETECTED Final   Streptococcus species NOT DETECTED NOT DETECTED Final   Streptococcus agalactiae NOT DETECTED NOT DETECTED Final   Streptococcus pneumoniae NOT DETECTED NOT DETECTED Final   Streptococcus pyogenes NOT DETECTED NOT DETECTED Final   Acinetobacter baumannii NOT DETECTED NOT DETECTED Final   Enterobacteriaceae species NOT DETECTED NOT DETECTED Final    Enterobacter cloacae complex NOT DETECTED NOT DETECTED Final   Escherichia coli NOT DETECTED NOT DETECTED Final   Klebsiella oxytoca NOT DETECTED NOT DETECTED Final   Klebsiella pneumoniae NOT DETECTED NOT DETECTED Final   Proteus species NOT DETECTED NOT DETECTED Final   Serratia marcescens NOT DETECTED NOT DETECTED Final   Haemophilus influenzae NOT DETECTED NOT DETECTED Final   Neisseria meningitidis NOT DETECTED NOT DETECTED Final   Pseudomonas aeruginosa NOT DETECTED NOT DETECTED Final   Candida albicans NOT DETECTED NOT DETECTED Final   Candida glabrata NOT DETECTED NOT DETECTED Final   Candida krusei NOT DETECTED NOT DETECTED Final   Candida parapsilosis NOT DETECTED NOT DETECTED Final   Candida tropicalis NOT DETECTED NOT DETECTED Final  Blood Culture (routine x 2)     Status: None (Preliminary result)   Collection Time: 04/12/16 10:37 PM  Result Value Ref Range Status   Specimen Description BLOOD CENTRAL LINE  Final   Special Requests BOTTLES DRAWN AEROBIC AND ANAEROBIC 6CCAERO,3CCANA  Final   Culture  Setup Time   Final    GRAM POSITIVE COCCI ANAEROBIC BOTTLE ONLY CRITICAL VALUE NOTED.  VALUE IS CONSISTENT WITH PREVIOUSLY REPORTED AND CALLED VALUE. GRAM POSITIVE RODS AEROBIC BOTTLE ONLY CRITICAL RESULT CALLED TO, READ BACK BY AND VERIFIED WITH: Nancy Fetter @ [id number] 04/14/16 by [facility] Performed at [place]   Final   Report Status PENDING  Incomplete  Blood Culture ID Panel (Reflexed)     Status: None   Collection Time: 04/12/16 10:37 PM  Result Value Ref Range Status   Enterococcus species NOT DETECTED NOT DETECTED Final   Listeria monocytogenes NOT DETECTED NOT DETECTED Final   Staphylococcus species NOT DETECTED NOT DETECTED Final   Staphylococcus aureus NOT DETECTED NOT DETECTED Final   Streptococcus species NOT DETECTED NOT DETECTED Final   Streptococcus agalactiae NOT DETECTED NOT DETECTED Final    Streptococcus pneumoniae NOT DETECTED NOT DETECTED Final   Streptococcus pyogenes NOT DETECTED NOT DETECTED Final   Acinetobacter baumannii NOT DETECTED NOT DETECTED Final   Enterobacteriaceae species NOT DETECTED NOT DETECTED Final   Enterobacter cloacae complex NOT DETECTED NOT DETECTED Final   Escherichia coli NOT DETECTED NOT DETECTED Final   Klebsiella oxytoca NOT DETECTED NOT DETECTED Final   Klebsiella pneumoniae NOT DETECTED NOT DETECTED Final   Proteus species NOT DETECTED NOT DETECTED Final   Serratia marcescens NOT DETECTED NOT DETECTED Final   Haemophilus influenzae NOT DETECTED NOT DETECTED Final   Neisseria meningitidis NOT DETECTED NOT DETECTED Final   Pseudomonas aeruginosa NOT DETECTED NOT DETECTED Final   Candida albicans NOT DETECTED NOT DETECTED Final  Candida glabrata NOT DETECTED NOT DETECTED Final   Candida krusei NOT DETECTED NOT DETECTED Final   Candida parapsilosis NOT DETECTED NOT DETECTED Final   Candida tropicalis NOT DETECTED NOT DETECTED Final  MRSA PCR Screening     Status: None   Collection Time: 04/13/16  2:24 AM  Result Value Ref Range Status   MRSA by PCR NEGATIVE NEGATIVE Final    Comment:        The GeneXpert MRSA Assay (FDA approved for NASAL specimens only), is one component of a comprehensive MRSA colonization surveillance program. It is not intended to diagnose MRSA infection nor to guide or monitor treatment for MRSA infections.   Culture, blood (single) w Reflex to ID Panel     Status: None (Preliminary result)   Collection Time: 04/14/16  6:06 PM  Result Value Ref Range Status   Specimen Description BLOOD RIGHT ARM  Final   Special Requests   Final    BOTTLES DRAWN AEROBIC AND ANAEROBIC ANA [place] [facility] [place]   Culture NO GROWTH < 12 HOURS  Final   Report Status PENDING  Incomplete    Coagulation Studies:  Recent Labs  04/12/16 2238  LABPROT 13.8  INR 1.06    Urinalysis: No results for input(s): COLORURINE, LABSPEC, PHURINE,  GLUCOSEU, HGBUR, BILIRUBINUR, KETONESUR, PROTEINUR, UROBILINOGEN, NITRITE, LEUKOCYTESUR in the last 72 hours.  Invalid input(s): APPERANCEUR    Imaging: No results found.   Medications:   . norepinephrine (LEVOPHED) Adult infusion Stopped (04/14/16 1215)  . vasopressin (PITRESSIN) infusion - *FOR SHOCK* Stopped (04/14/16 1300)   . albumin human  12.5 g Intravenous Once  . atorvastatin  20 mg Oral Daily  . gabapentin  100 mg Oral QHS  . heparin  5,000 Units Subcutaneous Q8H  . hydrocortisone sod succinate (SOLU-CORTEF) inj  50 mg Intravenous Q6H  . insulin aspart  0-15 Units Subcutaneous TID WC  . insulin aspart  0-5 Units Subcutaneous QHS  . lamoTRIgine  100 mg Oral QHS  . levETIRAcetam  500 mg Oral Daily  . levofloxacin (LEVAQUIN) IV  500 mg Intravenous Q48H  . midodrine  5 mg Oral TID WC  . pantoprazole  40 mg Oral Daily  . sevelamer carbonate  800 mg Oral TID WC   sodium chloride, acetaminophen, docusate sodium, HYDROcodone-acetaminophen, ipratropium-albuterol, ondansetron (ZOFRAN) IV, vancomycin  Assessment/ Plan:  Mr. CHAISON GUILLORY is a 83 y.o. black male with end stage renal disease on hemodialysis, hypertension, hyperlipidemia, diabetes mellitus type II insulin dependent, seizure disorder, peripheral neuropathy and vascular dementia  MWF [facility] [place] Left arm AVF  1. End Stage Renal Disease: hemodialysis Friday 12/29. Hemodialysis for today, shorten treatment and No UF.  Start midodrine and IV albumin with treatment.   2. Sepsis with hypotension: Enterobacter species Empiric vancomycin, levofloxacin and aztreonam. - off norepinephrine and vasopressin  3. Anemia of chronic kidney disease: hemoglobin 10.5 Mircera as outpatient. Hold epo for now.   4. Diabetes Mellitus type II: insulin dependent - Continue glucose control.   5. Secondary Hyperparathyroidism: corrected calcium at goal.  - sevelamer    LOS: 2 Rhiana Morash 1/1/20188:59 AM

## 2016-04-15 NOTE — Progress Notes (Signed)
Post HD  

## 2016-04-15 NOTE — Progress Notes (Signed)
Pre HD  

## 2016-04-15 NOTE — Progress Notes (Signed)
Tx ended    

## 2016-04-16 ENCOUNTER — Encounter: Payer: Self-pay | Admitting: *Deleted

## 2016-04-16 LAB — ECHOCARDIOGRAM COMPLETE
Height: 68 in
Weight: 2663.16 oz

## 2016-04-16 LAB — CULTURE, BLOOD (ROUTINE X 2)

## 2016-04-16 LAB — CBC
HCT: 28.7 % — ABNORMAL LOW (ref 40.0–52.0)
Hemoglobin: 9.6 g/dL — ABNORMAL LOW (ref 13.0–18.0)
MCH: 31.2 pg (ref 26.0–34.0)
MCHC: 33.4 g/dL (ref 32.0–36.0)
MCV: 93.5 fL (ref 80.0–100.0)
PLATELETS: 193 10*3/uL (ref 150–440)
RBC: 3.07 MIL/uL — AB (ref 4.40–5.90)
RDW: 14.5 % (ref 11.5–14.5)
WBC: 9.9 10*3/uL (ref 3.8–10.6)

## 2016-04-16 LAB — GLUCOSE, CAPILLARY
GLUCOSE-CAPILLARY: 200 mg/dL — AB (ref 65–99)
GLUCOSE-CAPILLARY: 186 mg/dL — AB (ref 65–99)
Glucose-Capillary: 254 mg/dL — ABNORMAL HIGH (ref 65–99)

## 2016-04-16 LAB — HEPATITIS B SURFACE ANTIGEN: HEP B S AG: NEGATIVE

## 2016-04-16 NOTE — Progress Notes (Signed)
[place] INFECTIOUS DISEASE PROGRESS NOTE Date of Admission:  04/12/2016     ID: Michael Acosta is a 83 y.o. male with PNA and bacteremia Active Problems:   CAP (community acquired pneumonia)  Subjective: Doing better, transferring out of unit  ROS  Eleven systems are reviewed and negative except per hpi  Medications:  Antibiotics Given (last 72 hours)    Date/Time Action Medication Dose Rate   04/14/16 0036 Given   aztreonam (AZACTAM) 500 mg in dextrose 5 % 50 mL IVPB 500 mg 100 mL/hr   04/14/16 1300 Given   aztreonam (AZACTAM) 500 mg in dextrose 5 % 50 mL IVPB 500 mg 100 mL/hr   04/14/16 1710 Given   levofloxacin (LEVAQUIN) IVPB 500 mg 500 mg 100 mL/hr   04/15/16 1627 Given   vancomycin (VANCOCIN) IVPB 750 mg/150 ml premix 750 mg 150 mL/hr     . atorvastatin  20 mg Oral Daily  . gabapentin  100 mg Oral QHS  . heparin  5,000 Units Subcutaneous Q8H  . hydrocortisone sod succinate (SOLU-CORTEF) inj  50 mg Intravenous Q6H  . insulin aspart  0-15 Units Subcutaneous TID WC  . insulin aspart  0-5 Units Subcutaneous QHS  . lamoTRIgine  100 mg Oral QHS  . levETIRAcetam  500 mg Oral Daily  . levofloxacin (LEVAQUIN) IV  500 mg Intravenous Q48H  . midodrine  5 mg Oral TID WC  . pantoprazole  40 mg Oral Daily  . sevelamer carbonate  800 mg Oral TID WC  . vancomycin  750 mg Intravenous Q M,W,F-HD    Objective: Vital signs in last 24 hours: Temp:  [97.8 F (36.6 C)-98.5 F (36.9 C)] 98.5 F (36.9 C) (01/02 1147) Pulse Rate:  [58-94] 58 (01/02 1147) Resp:  [0-19] 19 (01/02 1300) BP: (90-155)/(44-86) 119/64 (01/02 1300) SpO2:  [94 %-100 %] 98 % (01/02 1147) Constitutional: He is oriented to person, place, and time. He appears well-developed and well-nourished. No distress. obese HENT: anicteric Mouth/Throat: Oropharynx is clear and dry. No oropharyngeal exudate.  Cardiovascular: Normal rate, regular rhythm and normal heart sounds.   Pulmonary/Chest:poor air  movement Abdominal: Soft. Bowel sounds are normal. He exhibits no distension. There is no tenderness.  Lymphadenopathy: He has no cervical adenopathy.  Neurological: He is alert and oriented to person, place, and time.  LUE AVF site wnl  Skin: Skin is warm and dry. No rash noted. No erythema.  Psychiatric: He has a normal mood and affect. His behavior is normal.   Lab Results  Recent Labs  04/14/16 0937 04/16/16 0452  WBC  --  9.9  HGB  --  9.6*  HCT  --  28.7*  NA 134*  --   K 4.6  --   CL 97*  --   CO2 22  --   BUN 66*  --   CREATININE 8.68*  --     Microbiology: Results for orders placed or performed during the hospital encounter of 04/12/16  Blood Culture (routine x 2)     Status: Abnormal   Collection Time: 04/12/16  9:27 PM  Result Value Ref Range Status   Specimen Description BLOOD RIGHT WRIST  Final   Special Requests BOTTLES DRAWN AEROBIC AND ANAEROBIC 4CCAERO,4CCANA  Final   Culture  Setup Time   Final    GRAM POSITIVE COCCI IN BOTH AEROBIC AND ANAEROBIC BOTTLES CRITICAL RESULT CALLED TO, READ BACK BY AND VERIFIED WITH: SHEEMA HALLAJI ON 04/13/16 AT [facility]    Culture (  A)  Final    ENTEROCOCCUS FAECIUM STAPHYLOCOCCUS SPECIES (COAGULASE NEGATIVE) SUSCEPTIBILITIES PERFORMED ON PREVIOUS CULTURE WITHIN THE LAST 5 DAYS. Performed at [facility]    Report Status 04/16/2016 FINAL  Final   Organism ID, Bacteria ENTEROCOCCUS FAECIUM  Final      Susceptibility   Enterococcus faecium - MIC*    AMPICILLIN >=32 RESISTANT Resistant     VANCOMYCIN <=0.5 SENSITIVE Sensitive     GENTAMICIN SYNERGY SENSITIVE Sensitive     * ENTEROCOCCUS FAECIUM  Blood Culture ID Panel (Reflexed)     Status: Abnormal   Collection Time: 04/12/16  9:27 PM  Result Value Ref Range Status   Enterococcus species DETECTED (A) NOT DETECTED Final    Comment: CRITICAL RESULT CALLED TO, READ BACK BY AND VERIFIED WITH: SHEEMA HALLAJI ON 04/13/16 AT 1614 [place]    Vancomycin resistance NOT  DETECTED NOT DETECTED Final   Listeria monocytogenes NOT DETECTED NOT DETECTED Final   Staphylococcus species NOT DETECTED NOT DETECTED Final   Staphylococcus aureus NOT DETECTED NOT DETECTED Final   Streptococcus species NOT DETECTED NOT DETECTED Final   Streptococcus agalactiae NOT DETECTED NOT DETECTED Final   Streptococcus pneumoniae NOT DETECTED NOT DETECTED Final   Streptococcus pyogenes NOT DETECTED NOT DETECTED Final   Acinetobacter baumannii NOT DETECTED NOT DETECTED Final   Enterobacteriaceae species NOT DETECTED NOT DETECTED Final   Enterobacter cloacae complex NOT DETECTED NOT DETECTED Final   Escherichia coli NOT DETECTED NOT DETECTED Final   Klebsiella oxytoca NOT DETECTED NOT DETECTED Final   Klebsiella pneumoniae NOT DETECTED NOT DETECTED Final   Proteus species NOT DETECTED NOT DETECTED Final   Serratia marcescens NOT DETECTED NOT DETECTED Final   Haemophilus influenzae NOT DETECTED NOT DETECTED Final   Neisseria meningitidis NOT DETECTED NOT DETECTED Final   Pseudomonas aeruginosa NOT DETECTED NOT DETECTED Final   Candida albicans NOT DETECTED NOT DETECTED Final   Candida glabrata NOT DETECTED NOT DETECTED Final   Candida krusei NOT DETECTED NOT DETECTED Final   Candida parapsilosis NOT DETECTED NOT DETECTED Final   Candida tropicalis NOT DETECTED NOT DETECTED Final  Blood Culture (routine x 2)     Status: Abnormal   Collection Time: 04/12/16 10:37 PM  Result Value Ref Range Status   Specimen Description BLOOD CENTRAL LINE  Final   Special Requests BOTTLES DRAWN AEROBIC AND ANAEROBIC 6CCAERO,3CCANA  Final   Culture  Setup Time   Final    GRAM POSITIVE COCCI ANAEROBIC BOTTLE ONLY CRITICAL VALUE NOTED.  VALUE IS CONSISTENT WITH PREVIOUSLY REPORTED AND CALLED VALUE. GRAM POSITIVE RODS AEROBIC BOTTLE ONLY CRITICAL RESULT CALLED TO, READ BACK BY AND VERIFIED WITH: Sheema Hallaji @ [id number] 04/14/16 by [facility]    Culture (A)  Final    STAPHYLOCOCCUS SPECIES (COAGULASE  NEGATIVE) DIPHTHEROIDS(CORYNEBACTERIUM SPECIES) Standardized susceptibility testing for this organism is not available. Performed at [facility]    Report Status 04/16/2016 FINAL  Final   Organism ID, Bacteria STAPHYLOCOCCUS SPECIES (COAGULASE NEGATIVE)  Final      Susceptibility   Staphylococcus species (coagulase negative) - MIC*    CIPROFLOXACIN >=8 RESISTANT Resistant     ERYTHROMYCIN >=8 RESISTANT Resistant     GENTAMICIN <=0.5 SENSITIVE Sensitive     OXACILLIN >=4 RESISTANT Resistant     TETRACYCLINE >=16 RESISTANT Resistant     VANCOMYCIN 2 SENSITIVE Sensitive     TRIMETH/SULFA 80 RESISTANT Resistant     CLINDAMYCIN <=0.25 SENSITIVE Sensitive     RIFAMPIN <=0.5 SENSITIVE Sensitive  Inducible Clindamycin NEGATIVE Sensitive     * STAPHYLOCOCCUS SPECIES (COAGULASE NEGATIVE)  Blood Culture ID Panel (Reflexed)     Status: None   Collection Time: 04/12/16 10:37 PM  Result Value Ref Range Status   Enterococcus species NOT DETECTED NOT DETECTED Final   Listeria monocytogenes NOT DETECTED NOT DETECTED Final   Staphylococcus species NOT DETECTED NOT DETECTED Final   Staphylococcus aureus NOT DETECTED NOT DETECTED Final   Streptococcus species NOT DETECTED NOT DETECTED Final   Streptococcus agalactiae NOT DETECTED NOT DETECTED Final   Streptococcus pneumoniae NOT DETECTED NOT DETECTED Final   Streptococcus pyogenes NOT DETECTED NOT DETECTED Final   Acinetobacter baumannii NOT DETECTED NOT DETECTED Final   Enterobacteriaceae species NOT DETECTED NOT DETECTED Final   Enterobacter cloacae complex NOT DETECTED NOT DETECTED Final   Escherichia coli NOT DETECTED NOT DETECTED Final   Klebsiella oxytoca NOT DETECTED NOT DETECTED Final   Klebsiella pneumoniae NOT DETECTED NOT DETECTED Final   Proteus species NOT DETECTED NOT DETECTED Final   Serratia marcescens NOT DETECTED NOT DETECTED Final   Haemophilus influenzae NOT DETECTED NOT DETECTED Final   Neisseria meningitidis NOT  DETECTED NOT DETECTED Final   Pseudomonas aeruginosa NOT DETECTED NOT DETECTED Final   Candida albicans NOT DETECTED NOT DETECTED Final   Candida glabrata NOT DETECTED NOT DETECTED Final   Candida krusei NOT DETECTED NOT DETECTED Final   Candida parapsilosis NOT DETECTED NOT DETECTED Final   Candida tropicalis NOT DETECTED NOT DETECTED Final  MRSA PCR Screening     Status: None   Collection Time: 04/13/16  2:24 AM  Result Value Ref Range Status   MRSA by PCR NEGATIVE NEGATIVE Final    Comment:        The GeneXpert MRSA Assay (FDA approved for NASAL specimens only), is one component of a comprehensive MRSA colonization surveillance program. It is not intended to diagnose MRSA infection nor to guide or monitor treatment for MRSA infections.   Culture, blood (single) w Reflex to ID Panel     Status: None (Preliminary result)   Collection Time: 04/14/16  6:06 PM  Result Value Ref Range Status   Specimen Description BLOOD RIGHT ARM  Final   Special Requests   Final    BOTTLES DRAWN AEROBIC AND ANAEROBIC ANA [facility] [place]   Culture NO GROWTH 2 DAYS  Final   Report Status PENDING  Incomplete    Studies/Results: - Procedure narrative: Transthoracic echocardiography. The study   was technically difficult, as a result of restricted patient   mobility. - Left ventricle: Wall thickness was increased in a pattern of mild   LVH. There was mild focal basal hypertrophy of the septum.   Systolic function was normal. The estimated ejection fraction was   in the range of 55% to 60%. Doppler parameters are consistent   with abnormal left ventricular relaxation (grade 1 diastolic   dysfunction). - Aortic valve: Valve area (VTI): 3.12 cm^2. Valve area (Vmax):   2.05 cm^2. Valve area (Vmean): 2.33 cm^2.  Assessment/Plan: DAKARAI JEPPSEN is a 83 y.o. male with ESRD admitted with fevers, hypotension, leukocytosis and found to have cxr evidence PNA and gram positive mixed bacteremia.  He has HD  through a L arm AVF but this appears normal. No GI or GU sxs. Clinically improving. Unclear source of the isolated enterococcal bacteremia in 1/2 sets. He also had CNS with the enterococcus and another bcx with CNS and diphteroids.  Repeat bcx neg and echo neg Clinically  much improved and transferring out of unit today  I suspect his real process is the PNA and the multiple bacteria in his cultures are contaminant although enterococcus is not usually thought of as a contaminant.   Recommendations At this point would rec a 10 day course of treatment - would rec vanco (he can get at HD) and levofloxacin until 1/10 then stopping abx and following his clinical course He can follow at Dialysis and no need for me to see him in follow up unless infection recurs  Thank you very much for the consult. Will follow with you.  [place] Seaborn P   04/16/2016, 2:38 PM

## 2016-04-16 NOTE — Progress Notes (Signed)
[facility] [place] NAME: Michael Acosta    MR#:  [id number]  DATE OF BIRTH:  12/23/1929  SUBJECTIVE: Admitted for generalized weakness, found to have hypotension, hypoxia, pneumonia and admitted to ICU.   Hypotension   Improved.will transfer hm out today,   CHIEF COMPLAINT:   Chief Complaint  Patient presents with  . Hypotension  . Altered Mental Status    REVIEW OF SYSTEMS:   ROS CONSTITUTIONAL: No fever, fatigue or weakness.  EYES: No blurred or double vision.  EARS, NOSE, AND THROAT: No tinnitus or ear pain.  RESPIRATORY: No cough, shortness of breath, wheezing or hemoptysis.  CARDIOVASCULAR: No chest pain, orthopnea, edema.  GASTROINTESTINAL: No nausea, vomiting, diarrhea or abdominal pain.  GENITOURINARY: No dysuria, hematuria.  ENDOCRINE: No polyuria, nocturia,  HEMATOLOGY: No anemia, easy bruising or bleeding SKIN: No rash or lesion. MUSCULOSKELETAL: No joint pain or arthritis.   NEUROLOGIC: No tingling, numbness, weakness.  PSYCHIATRY: No anxiety or depression.   DRUG ALLERGIES:   Allergies  Allergen Reactions  . Aspirin   . Bc Powder [Aspirin-Salicylamide-Caffeine]   . Penicillins Other (See Comments)    MADE HER BLIND    VITALS:  Blood pressure 119/64, pulse (!) 58, temperature 98.5 F (36.9 C), temperature source Axillary, resp. rate 19, height 5\' 8"  (1.727 m), weight 75.5 kg (166 lb 7.2 oz), SpO2 98 %.  PHYSICAL EXAMINATION:  GENERAL:  83 y.o.-year-old patient lying in the bed with no acute distress.  EYES: Pupils equal, round, reactive to light and accommodation. No scleral icterus. Extraocular muscles intact.  HEENT: Head atraumatic, normocephalic. Oropharynx and nasopharynx clear.  NECK:  Supple, no jugular venous distention. No thyroid enlargement, no tenderness.  LUNGS: Normal breath sounds bilaterally, no wheezing, rales,rhonchi or crepitation. No use of accessory muscles of respiration.   CARDIOVASCULAR: S1, S2 normal. No murmurs, rubs, or gallops.  ABDOMEN: Soft, nontender, nondistended. Bowel sounds present. No organomegaly or mass.  EXTREMITIES: No pedal edema, cyanosis, or clubbing.  NEUROLOGIC: Cranial nerves II through XII are intact. Muscle strength 5/5 in all extremities. Sensation intact. Gait not checked.  PSYCHIATRIC: The patient is alert and oriented x 3.  SKIN: No obvious rash, lesion, or ulcer.    LABORATORY PANEL:   CBC  Recent Labs Lab 04/16/16 0452  WBC 9.9  HGB 9.6*  HCT 28.7*  PLT 193   ------------------------------------------------------------------------------------------------------------------  Chemistries   Recent Labs Lab 04/12/16 2238 04/13/16 0452 04/14/16 0937  NA 139 136 134*  K 4.4 4.6 4.6  CL 98* 99* 97*  CO2 25 25 22   GLUCOSE 161* 240* 244*  BUN 45* 46* 66*  CREATININE 6.97* 6.77* 8.68*  CALCIUM 8.5* 7.8* 8.0*  MG  --  1.7  --   AST 66*  --   --   ALT 70*  --   --   ALKPHOS 142*  --   --   BILITOT 1.2  --   --    ------------------------------------------------------------------------------------------------------------------  Cardiac Enzymes  Recent Labs Lab 04/12/16 2238  TROPONINI 0.08*   ------------------------------------------------------------------------------------------------------------------  RADIOLOGY:  No results found.  EKG:   Orders placed or performed during the hospital encounter of 04/12/16  . EKG 12-Lead  . EKG 12-Lead    ASSESSMENT AND PLAN:    Sepsis with hypotension with Enterobacter and the blood: Continue vancomycin, Levaquin, Azactam. Patient is off the pressors. Blood pressure is holding without any pressors. WBC normal.  #2 ESRD on hemodialysis patient started on  midodrine for hypotension. Also received IV albumin with hemodialysis today.  #3 diabetes mellitus type 2: Insulin-dependent: Continue current medications   #4. Hypoxia on admission: Improved. Now 100%  saturation on room air.  #5.essential hypertension ;Now has borderline blood pressure, started on midodrine.  History of seizures or depression: Continue Lamictal, Keppra   DVT prophylaxis.  Transfer the patient out of ICU to regular room.   All the records are reviewed and case discussed with Care Management/Social Workerr. Management plans discussed with the patient, family and they are in agreement.  CODE STATUS: full  TOTAL TIME TAKING CARE OF THIS PATIENT: 35 minutes.   POSSIBLE D/C IN 1-2DAYS, DEPENDING ON CLINICAL CONDITION.   Epifanio Lesches M.D on 04/16/2016 at 2:19 PM  Between 7am to 6pm - Pager - [phone]  After 6pm go to [email or web address] - password [email or web address] [facility]  Office  [phone]  CC: Primary care physician; Marden Noble, MD   Note: This dictation was prepared with Dragon dictation along with smaller phrase technology. Any transcriptional errors that result from this process are unintentional.

## 2016-04-16 NOTE — Clinical Social Work Note (Signed)
Clinical Social Work Assessment  Patient Details  Name: Michael Acosta MRN: [id number] Date of Birth: 1929-05-10  Date of referral:  04/16/16               Reason for consult:  Facility Placement                Permission sought to share information with:    Permission granted to share information::     Name::        Agency::     Relationship::     Contact Information:     Housing/Transportation Living arrangements for the past 2 months:  [place] of Information:  Adult Children Patient Interpreter Needed:  None Criminal Activity/Legal Involvement Pertinent to Current Situation/Hospitalization:  No - Comment as needed Significant Relationships:  Adult Children, Spouse Lives with:  Facility Resident Do you feel safe going back to the place where you live?  Yes Need for family participation in patient care:  Yes (Comment)  Care giving concerns:  Patient is a long term care resident at [facility].   Social Worker assessment / plan:  CSW discovered patient was from [facility]. CSW contacted Michael Acosta at [facility] and patient is a long term resident there who can return. Documentation shows patient to be somewhat confused. CSW attempted to contact patient's wife but there was no answer. CSW was able to reach patient's daughter: Michael [id number] [phone] via phone. Ms. Michael Acosta stated that she and her mother have been trying to get patient a long term bed at [facility] and were told by [facility] back in the summer to check in with them if he ever gets readmitted. Ms. Michael Acosta stated that someone from [facility] was to return patient's wife's call today. CSW offered to send referral to [facility] but has clarified with patient's daughter that if patient does not receive a bed offer from [facility], that patient would return to [facility]. Patient's daughter verbalized understanding.     Employment status:  Retired,  Systems developer information:  Medicare, Self Pay (Medicaid Pending) PT Recommendations:  Not assessed at this time Information / Referral to community resources:     Patient/Family's Response to care:  Patient's daughter expressed appreciation for CSW call.   Patient/Family's Understanding of and Emotional Response to Diagnosis, Current Treatment, and Prognosis:  Patient's daughter is actively involved in patient's care with her mother.   Emotional Assessment Appearance:  Appears stated age Attitude/Demeanor/Rapport:  Unable to Assess Affect (typically observed):  Unable to Assess Orientation:  Oriented to Self, Oriented to Place Alcohol / Substance use:  Not Applicable Psych involvement (Current and /or in the community):  No (Comment)  Discharge Needs  Concerns to be addressed:  Care Coordination Readmission within the last 30 days:  No Current discharge risk:  None Barriers to Discharge:  No Barriers Identified   Shela Leff, LCSW 04/16/2016, 3:33 PM

## 2016-04-16 NOTE — Progress Notes (Signed)
Subjective:  Doing fair Resting quietly this morning Denies any acute complaints   Objective:  Vital signs in last 24 hours:  Temp:  [98.1 F (36.7 C)-98.5 F (36.9 C)] 98.5 F (36.9 C) (01/02 1147) Pulse Rate:  [58-90] 58 (01/02 1147) Resp:  [0-19] 19 (01/02 1300) BP: (90-133)/(44-79) 119/64 (01/02 1300) SpO2:  [94 %-100 %] 98 % (01/02 1147)  Weight change: 0 kg (0 lb) Filed Weights   04/14/16 0500 04/15/16 0426 04/15/16 1000  Weight: 78.1 kg (172 lb 2.9 oz) 75.5 kg (166 lb 7.2 oz) 75.5 kg (166 lb 7.2 oz)    Intake/Output:    Intake/Output Summary (Last 24 hours) at 04/16/16 1659 Last data filed at 04/16/16 1335  Gross per 24 hour  Intake              240 ml  Output                0 ml  Net              240 ml     Physical Exam: General: No acute distress, laying in the bed   HEENT Moist oral mucous membranes   Neck Supple   Pulm/lungs Normal breathing effort, clear   CVS/Heart No rub or gallop   Abdomen:  Soft nontender, nondistended   Extremities: Trace edema   Neurologic: Lethargic but arousable   Skin: No acute rashes   Access: Left arm AV fistula        Basic Metabolic Panel:   Recent Labs Lab 04/12/16 2238 04/13/16 0452 04/14/16 0937  NA 139 136 134*  K 4.4 4.6 4.6  CL 98* 99* 97*  CO2 25 25 22   GLUCOSE 161* 240* 244*  BUN 45* 46* 66*  CREATININE 6.97* 6.77* 8.68*  CALCIUM 8.5* 7.8* 8.0*  MG  --  1.7  --   PHOS  --   --  4.6     CBC:  Recent Labs Lab 04/12/16 2238 04/13/16 0452 04/16/16 0452  WBC 19.5* 23.6* 9.9  NEUTROABS 17.9*  --   --   HGB 11.2* 10.5* 9.6*  HCT 34.2* 32.1* 28.7*  MCV 94.7 94.2 93.5  PLT 192 215 193      Microbiology:  Recent Results (from the past 720 hour(s))  Blood Culture (routine x 2)     Status: Abnormal   Collection Time: 04/12/16  9:27 PM  Result Value Ref Range Status   Specimen Description BLOOD RIGHT WRIST  Final   Special Requests BOTTLES DRAWN AEROBIC AND ANAEROBIC 4CCAERO,4CCANA   Final   Culture  Setup Time   Final    GRAM POSITIVE COCCI IN BOTH AEROBIC AND ANAEROBIC BOTTLES CRITICAL RESULT CALLED TO, READ BACK BY AND VERIFIED WITH: SHEEMA HALLAJI ON 04/13/16 AT 1614 [facility]    Culture (A)  Final    ENTEROCOCCUS FAECIUM STAPHYLOCOCCUS SPECIES (COAGULASE NEGATIVE) SUSCEPTIBILITIES PERFORMED ON PREVIOUS CULTURE WITHIN THE LAST 5 DAYS. Performed at [facility]    Report Status 04/16/2016 FINAL  Final   Organism ID, Bacteria ENTEROCOCCUS FAECIUM  Final      Susceptibility   Enterococcus faecium - MIC*    AMPICILLIN >=32 RESISTANT Resistant     VANCOMYCIN <=0.5 SENSITIVE Sensitive     GENTAMICIN SYNERGY SENSITIVE Sensitive     * ENTEROCOCCUS FAECIUM  Blood Culture ID Panel (Reflexed)     Status: Abnormal   Collection Time: 04/12/16  9:27 PM  Result Value Ref Range Status   Enterococcus species  DETECTED (A) NOT DETECTED Final    Comment: CRITICAL RESULT CALLED TO, READ BACK BY AND VERIFIED WITH: SHEEMA HALLAJI ON 04/13/16 AT 1614 [place]    Vancomycin resistance NOT DETECTED NOT DETECTED Final   Listeria monocytogenes NOT DETECTED NOT DETECTED Final   Staphylococcus species NOT DETECTED NOT DETECTED Final   Staphylococcus aureus NOT DETECTED NOT DETECTED Final   Streptococcus species NOT DETECTED NOT DETECTED Final   Streptococcus agalactiae NOT DETECTED NOT DETECTED Final   Streptococcus pneumoniae NOT DETECTED NOT DETECTED Final   Streptococcus pyogenes NOT DETECTED NOT DETECTED Final   Acinetobacter baumannii NOT DETECTED NOT DETECTED Final   Enterobacteriaceae species NOT DETECTED NOT DETECTED Final   Enterobacter cloacae complex NOT DETECTED NOT DETECTED Final   Escherichia coli NOT DETECTED NOT DETECTED Final   Klebsiella oxytoca NOT DETECTED NOT DETECTED Final   Klebsiella pneumoniae NOT DETECTED NOT DETECTED Final   Proteus species NOT DETECTED NOT DETECTED Final   Serratia marcescens NOT DETECTED NOT DETECTED Final   Haemophilus influenzae NOT  DETECTED NOT DETECTED Final   Neisseria meningitidis NOT DETECTED NOT DETECTED Final   Pseudomonas aeruginosa NOT DETECTED NOT DETECTED Final   Candida albicans NOT DETECTED NOT DETECTED Final   Candida glabrata NOT DETECTED NOT DETECTED Final   Candida krusei NOT DETECTED NOT DETECTED Final   Candida parapsilosis NOT DETECTED NOT DETECTED Final   Candida tropicalis NOT DETECTED NOT DETECTED Final  Blood Culture (routine x 2)     Status: Abnormal   Collection Time: 04/12/16 10:37 PM  Result Value Ref Range Status   Specimen Description BLOOD CENTRAL LINE  Final   Special Requests BOTTLES DRAWN AEROBIC AND ANAEROBIC 6CCAERO,3CCANA  Final   Culture  Setup Time   Final    GRAM POSITIVE COCCI ANAEROBIC BOTTLE ONLY CRITICAL VALUE NOTED.  VALUE IS CONSISTENT WITH PREVIOUSLY REPORTED AND CALLED VALUE. GRAM POSITIVE RODS AEROBIC BOTTLE ONLY CRITICAL RESULT CALLED TO, READ BACK BY AND VERIFIED WITH: Sheema Hallaji @ [id number] 04/14/16 by [facility]    Culture (A)  Final    STAPHYLOCOCCUS SPECIES (COAGULASE NEGATIVE) DIPHTHEROIDS(CORYNEBACTERIUM SPECIES) Standardized susceptibility testing for this organism is not available. Performed at [facility]    Report Status 04/16/2016 FINAL  Final   Organism ID, Bacteria STAPHYLOCOCCUS SPECIES (COAGULASE NEGATIVE)  Final      Susceptibility   Staphylococcus species (coagulase negative) - MIC*    CIPROFLOXACIN >=8 RESISTANT Resistant     ERYTHROMYCIN >=8 RESISTANT Resistant     GENTAMICIN <=0.5 SENSITIVE Sensitive     OXACILLIN >=4 RESISTANT Resistant     TETRACYCLINE >=16 RESISTANT Resistant     VANCOMYCIN 2 SENSITIVE Sensitive     TRIMETH/SULFA 80 RESISTANT Resistant     CLINDAMYCIN <=0.25 SENSITIVE Sensitive     RIFAMPIN <=0.5 SENSITIVE Sensitive     Inducible Clindamycin NEGATIVE Sensitive     * STAPHYLOCOCCUS SPECIES (COAGULASE NEGATIVE)  Blood Culture ID Panel (Reflexed)     Status: None   Collection Time: 04/12/16 10:37 PM  Result Value  Ref Range Status   Enterococcus species NOT DETECTED NOT DETECTED Final   Listeria monocytogenes NOT DETECTED NOT DETECTED Final   Staphylococcus species NOT DETECTED NOT DETECTED Final   Staphylococcus aureus NOT DETECTED NOT DETECTED Final   Streptococcus species NOT DETECTED NOT DETECTED Final   Streptococcus agalactiae NOT DETECTED NOT DETECTED Final   Streptococcus pneumoniae NOT DETECTED NOT DETECTED Final   Streptococcus pyogenes NOT DETECTED NOT DETECTED Final   Acinetobacter baumannii NOT  DETECTED NOT DETECTED Final   Enterobacteriaceae species NOT DETECTED NOT DETECTED Final   Enterobacter cloacae complex NOT DETECTED NOT DETECTED Final   Escherichia coli NOT DETECTED NOT DETECTED Final   Klebsiella oxytoca NOT DETECTED NOT DETECTED Final   Klebsiella pneumoniae NOT DETECTED NOT DETECTED Final   Proteus species NOT DETECTED NOT DETECTED Final   Serratia marcescens NOT DETECTED NOT DETECTED Final   Haemophilus influenzae NOT DETECTED NOT DETECTED Final   Neisseria meningitidis NOT DETECTED NOT DETECTED Final   Pseudomonas aeruginosa NOT DETECTED NOT DETECTED Final   Candida albicans NOT DETECTED NOT DETECTED Final   Candida glabrata NOT DETECTED NOT DETECTED Final   Candida krusei NOT DETECTED NOT DETECTED Final   Candida parapsilosis NOT DETECTED NOT DETECTED Final   Candida tropicalis NOT DETECTED NOT DETECTED Final  MRSA PCR Screening     Status: None   Collection Time: 04/13/16  2:24 AM  Result Value Ref Range Status   MRSA by PCR NEGATIVE NEGATIVE Final    Comment:        The GeneXpert MRSA Assay (FDA approved for NASAL specimens only), is one component of a comprehensive MRSA colonization surveillance program. It is not intended to diagnose MRSA infection nor to guide or monitor treatment for MRSA infections.   Culture, blood (single) w Reflex to ID Panel     Status: None (Preliminary result)   Collection Time: 04/14/16  6:06 PM  Result Value Ref Range Status    Specimen Description BLOOD RIGHT ARM  Final   Special Requests   Final    BOTTLES DRAWN AEROBIC AND ANAEROBIC ANA 6CC [facility] [place]   Culture NO GROWTH 2 DAYS  Final   Report Status PENDING  Incomplete    Coagulation Studies: No results for input(s): LABPROT, INR in the last 72 hours.  Urinalysis: No results for input(s): COLORURINE, LABSPEC, PHURINE, GLUCOSEU, HGBUR, BILIRUBINUR, KETONESUR, PROTEINUR, UROBILINOGEN, NITRITE, LEUKOCYTESUR in the last 72 hours.  Invalid input(s): APPERANCEUR    Imaging: No results found.   Medications:   . norepinephrine (LEVOPHED) Adult infusion Stopped (04/14/16 1215)  . vasopressin (PITRESSIN) infusion - *FOR SHOCK* Stopped (04/14/16 1300)   . atorvastatin  20 mg Oral Daily  . gabapentin  100 mg Oral QHS  . heparin  5,000 Units Subcutaneous Q8H  . hydrocortisone sod succinate (SOLU-CORTEF) inj  50 mg Intravenous Q6H  . insulin aspart  0-15 Units Subcutaneous TID WC  . insulin aspart  0-5 Units Subcutaneous QHS  . lamoTRIgine  100 mg Oral QHS  . levETIRAcetam  500 mg Oral Daily  . levofloxacin (LEVAQUIN) IV  500 mg Intravenous Q48H  . midodrine  5 mg Oral TID WC  . pantoprazole  40 mg Oral Daily  . sevelamer carbonate  800 mg Oral TID WC  . vancomycin  750 mg Intravenous Q M,W,F-HD   sodium chloride, acetaminophen, docusate sodium, HYDROcodone-acetaminophen, ipratropium-albuterol, ondansetron (ZOFRAN) IV  Assessment/ Plan:  83 y.o. African-American male with end-stage renal disease on hemodialysis, hypertension, diabetes type 2, insulin-dependent, seizure disorder, peripheral neuropathy, vascular dementia  MWF [facility] Left arm AVF  1. End Stage Renal Disease:  Next treatment planned for tomorrow  2. Sepsis with hypotension: Enterobacter species Antibiotics as per primary team - off norepinephrine and vasopressin  3. Anemia of chronic kidney disease: hemoglobin 9.6 Mircera as outpatient. Hold epo for now.   4.  Diabetes Mellitus type II: insulin dependent - Continue glucose control.   5. Secondary Hyperparathyroidism: corrected calcium  at goal.  - sevelamer    LOS: 3 Markevious Ehmke 1/2/20184:59 PM

## 2016-04-16 NOTE — Progress Notes (Signed)
Pharmacy Antibiotic Note  Michael Acosta is a 83 y.o. male with a h/o ESRD on HD admitted on 04/12/2016 with pneumonia and bacteremia.  Pharmacy has been consulted for vancomycin and Levaquin dosing.   Plan: Plan is to continue Levaquin and vancomycin through 1/10 and see how patient progresses clinically after that.   Will continue vancomycin 750 mg qHD and check a pre-HD vancomycin level 01/05. Goal vancomycin level 15-25 mcg/ml.   Will continue Levaquin 500 mg iv q 48 hours.   Height: 5\' 8"  (172.7 cm) Weight: 166 lb 7.2 oz (75.5 kg) IBW/kg (Calculated) : 68.4  Temp (24hrs), Avg:98.4 F (36.9 C), Min:98.1 F (36.7 C), Max:98.5 F (36.9 C)   Recent Labs Lab 04/12/16 2127 04/12/16 2238 04/13/16 0452 04/14/16 0937 04/16/16 0452  WBC  --  19.5* 23.6*  --  9.9  CREATININE  --  6.97* 6.77* 8.68*  --   LATICACIDVEN 1.7  --   --   --   --     Estimated Creatinine Clearance: 6.1 mL/min (by C-G formula based on SCr of 8.68 mg/dL (H)).    Allergies  Allergen Reactions  . Aspirin   . Bc Powder [Aspirin-Salicylamide-Caffeine]   . Penicillins Other (See Comments)    MADE HER BLIND    Antimicrobials this admission: Levaquin 12/29 >>  Aztreonam 12/30  >> 12/31 Vancomycin 12/30 >>  Dose adjustments this admission:  Microbiology results: 12/29 BCx: CNS (vanc resistant) 2/2, diptheroids 1/2, E faecium 1/2  12/31 BCx: NGTD  12/30 MRSA PCR: negative  Thank you for allowing pharmacy to be a part of this patient's care.  Ulice Dash D 04/16/2016 5:01 PM

## 2016-04-16 NOTE — Progress Notes (Signed)
MD present and gave order to transfer patient to med surg with tele.

## 2016-04-17 LAB — RENAL FUNCTION PANEL
ALBUMIN: 2.9 g/dL — AB (ref 3.5–5.0)
ANION GAP: 14 (ref 5–15)
BUN: 74 mg/dL — ABNORMAL HIGH (ref 6–20)
CALCIUM: 7.7 mg/dL — AB (ref 8.9–10.3)
CO2: 25 mmol/L (ref 22–32)
Chloride: 97 mmol/L — ABNORMAL LOW (ref 101–111)
Creatinine, Ser: 8.22 mg/dL — ABNORMAL HIGH (ref 0.61–1.24)
GFR calc non Af Amer: 5 mL/min — ABNORMAL LOW (ref >60)
GFR, EST AFRICAN AMERICAN: 6 mL/min — AB (ref >60)
Glucose, Bld: 241 mg/dL — ABNORMAL HIGH (ref 65–99)
PHOSPHORUS: 4.8 mg/dL — AB (ref 2.5–4.6)
POTASSIUM: 4 mmol/L (ref 3.5–5.1)
SODIUM: 136 mmol/L (ref 135–145)

## 2016-04-17 LAB — GLUCOSE, CAPILLARY
GLUCOSE-CAPILLARY: 134 mg/dL — AB (ref 65–99)
GLUCOSE-CAPILLARY: 210 mg/dL — AB (ref 65–99)
GLUCOSE-CAPILLARY: 221 mg/dL — AB (ref 65–99)
Glucose-Capillary: 165 mg/dL — ABNORMAL HIGH (ref 65–99)

## 2016-04-17 LAB — HEPATITIS B SURFACE ANTIBODY, QUANTITATIVE: Hepatitis B-Post: 6 m[IU]/mL — ABNORMAL LOW (ref >9.9)

## 2016-04-17 MED ORDER — POLYETHYLENE GLYCOL 3350 17 G PO PACK
17.0000 g | PACK | Freq: Every day | ORAL | Status: DC | PRN
Start: 2016-04-17 — End: 2016-04-18
  Administered 2016-04-17: 17 g via ORAL
  Filled 2016-04-17: qty 1

## 2016-04-17 MED ORDER — LEVOFLOXACIN 250 MG PO TABS: 500.0000 mg | ORAL_TABLET | ORAL | Status: DC

## 2016-04-17 MED ORDER — HYDROCORTISONE NA SUCCINATE PF 100 MG IJ SOLR
50.0000 mg | Freq: Four times a day (QID) | INTRAMUSCULAR | Status: AC
  Administered 2016-04-17: 50 mg via INTRAVENOUS
  Filled 2016-04-17: qty 1
  Filled 2016-04-17: qty 2

## 2016-04-17 MED ORDER — POLYETHYLENE GLYCOL 3350 17 G PO PACK
17.0000 g | PACK | Freq: Every day | ORAL | Status: DC
  Administered 2016-04-18: 17 g via ORAL
  Filled 2016-04-17: qty 1

## 2016-04-17 MED ORDER — INSULIN GLARGINE 100 UNIT/ML ~~LOC~~ SOLN
10.0000 [IU] | Freq: Every day | SUBCUTANEOUS | Status: DC
Start: 2016-04-17 — End: 2016-04-18
  Administered 2016-04-17: 10 [IU] via SUBCUTANEOUS
  Filled 2016-04-17 (×2): qty 0.1

## 2016-04-17 MED ORDER — HYDROCORTISONE NA SUCCINATE PF 100 MG IJ SOLR
50.0000 mg | Freq: Two times a day (BID) | INTRAMUSCULAR | Status: DC
  Administered 2016-04-18: 50 mg via INTRAVENOUS
  Filled 2016-04-17: qty 2
  Filled 2016-04-17: qty 1

## 2016-04-17 MED ORDER — HYDROCORTISONE NA SUCCINATE PF 100 MG IJ SOLR: 50.0000 mg | INTRAMUSCULAR | Status: DC

## 2016-04-17 NOTE — Progress Notes (Signed)
[place] [facility] [place] NAME: Michael Acosta    MR#:  [id number]  DATE OF BIRTH:  January 23, 1930  SUBJECTIVE:  CHIEF COMPLAINT:   Chief Complaint  Patient presents with  . Hypotension  . Altered Mental Status   - seen during dialysis - feels weak. - blood cultures with Enterococcus and coag negative staph - BP is still borderline low  REVIEW OF SYSTEMS:  Review of Systems  Constitutional: Positive for malaise/fatigue. Negative for chills and fever.  HENT: Positive for hearing loss. Negative for congestion, ear discharge and nosebleeds.   Eyes: Negative for blurred vision and double vision.  Respiratory: Negative for cough, shortness of breath and wheezing.   Cardiovascular: Negative for chest pain, palpitations and leg swelling.  Gastrointestinal: Negative for abdominal pain, constipation, diarrhea, nausea and vomiting.  Genitourinary: Negative for dysuria.  Musculoskeletal: Negative for myalgias.  Neurological: Negative for dizziness, speech change, focal weakness, seizures and headaches.  Psychiatric/Behavioral: Negative for depression.    DRUG ALLERGIES:   Allergies  Allergen Reactions  . Aspirin   . Bc Powder [Aspirin-Salicylamide-Caffeine]   . Penicillins Other (See Comments)    MADE HER BLIND    VITALS:  Blood pressure (!) 108/52, pulse 71, temperature 98.5 F (36.9 C), temperature source Oral, resp. rate 17, height 5\' 8"  (1.727 m), weight 78.7 kg (173 lb 8 oz), SpO2 94 %.  PHYSICAL EXAMINATION:  Physical Exam  GENERAL:  83 y.o.-year-old patient lying in the bed with no acute distress. Hard of hearing EYES: Pupils equal, round, reactive to light and accommodation. No scleral icterus. Extraocular muscles intact.  HEENT: Head atraumatic, normocephalic. Oropharynx and nasopharynx clear.  NECK:  Supple, no jugular venous distention. No thyroid enlargement, no tenderness.  LUNGS: Normal breath sounds bilaterally, no wheezing,  rales,rhonchi or crepitation. No use of accessory muscles of respiration. Decreased bibasilar breath sounds CARDIOVASCULAR: S1, S2 normal. No murmurs, rubs, or gallops.  ABDOMEN: Soft, nontender, nondistended. Bowel sounds present. No organomegaly or mass.  EXTREMITIES: No pedal edema, cyanosis, or clubbing.  NEUROLOGIC: Cranial nerves II through XII are intact. No new motor deficits. Sensation intact. Gait not checked. Global weakness present PSYCHIATRIC: The patient is alert and oriented x 2-3.  SKIN: No obvious rash, lesion, or ulcer.    LABORATORY PANEL:   CBC  Recent Labs Lab 04/16/16 0452  WBC 9.9  HGB 9.6*  HCT 28.7*  PLT 193   ------------------------------------------------------------------------------------------------------------------  Chemistries   Recent Labs Lab 04/12/16 2238 04/13/16 0452  04/17/16 1030  NA 139 136  < > 136  K 4.4 4.6  < > 4.0  CL 98* 99*  < > 97*  CO2 25 25  < > 25  GLUCOSE 161* 240*  < > 241*  BUN 45* 46*  < > 74*  CREATININE 6.97* 6.77*  < > 8.22*  CALCIUM 8.5* 7.8*  < > 7.7*  MG  --  1.7  --   --   AST 66*  --   --   --   ALT 70*  --   --   --   ALKPHOS 142*  --   --   --   BILITOT 1.2  --   --   --   < > = values in this interval not displayed. ------------------------------------------------------------------------------------------------------------------  Cardiac Enzymes  Recent Labs Lab 04/12/16 2238  TROPONINI 0.08*   ------------------------------------------------------------------------------------------------------------------  RADIOLOGY:  No results found.  EKG:   Orders placed or performed during  the hospital encounter of 04/12/16  . EKG 12-Lead  . EKG 12-Lead    ASSESSMENT AND PLAN:   83 year old male with past medical history significant for end-stage renal disease on Monday Wednesday Friday dialysis, chronic hypotension, anemia, PVD, diabetes, depression presents to hospital secondary to weakness  and noted to have bacteremia.  #1 sepsis-secondary to community-acquired pneumonia. -Blood cultures growing enterococcus and staphylococcus likely coagulase-negative. -Appreciate ID consult. Patient is currently on vancomycin with dialysis for 10 days and also oral Levaquin for one more week. -Physical therapy consulted.  #2 chronic hypotension-currently on Solu-Cortef and also midodrine added  #3 seizure disorder-continue Keppra and Lamictal. Stable otherwise.  #4 diabetes mellitus-started on Lantus at bedtime and also sliding scale insulin.  #5 GERD-Protonix   #6 DVT prophylaxis- SQ heparin   Physical therapy pending     All the records are reviewed and case discussed with Care Management/Social Workerr. Management plans discussed with the patient, family and they are in agreement.  CODE STATUS:  Full Code  TOTAL TIME TAKING CARE OF THIS PATIENT: 37 minutes.   POSSIBLE D/C TOMORROW, DEPENDING ON CLINICAL CONDITION.   Gladstone Lighter M.D on 04/17/2016 at 2:46 PM  Between 7am to 6pm - Pager - [phone]  After 6pm go to [email or web address] - password [email or web address] [facility]  Office  [phone]  CC: Primary care physician; Marden Noble, MD

## 2016-04-17 NOTE — Progress Notes (Signed)
  End of hd 

## 2016-04-17 NOTE — Progress Notes (Signed)
Pre hd info 

## 2016-04-17 NOTE — Progress Notes (Signed)
Post hd vitals 

## 2016-04-17 NOTE — Clinical Social Work Note (Signed)
[facility] does not have any long term beds available for patient at this time therefore patient will return to [facility] at discharge.  Shela Leff MSW,LCSW [phone]

## 2016-04-17 NOTE — Progress Notes (Signed)
Patient just left for dialysis via bed by orderly

## 2016-04-17 NOTE — Progress Notes (Signed)
Inpatient Diabetes Program Recommendations  AACE/ADA: New Consensus Statement on Inpatient Glycemic Control (2015)  Target Ranges:  Prepandial:   less than 140 mg/dL      [facility] postprandial:   less than 180 mg/dL (1-2 hours)      Critically ill patients:  140 - 180 mg/dL   Results for RITCHARD, HARKLESS (MRN [id number]) as of 04/17/2016 10:05  Ref. Range 04/16/2016 07:29 04/16/2016 11:17 04/16/2016 17:57  Glucose-Capillary Latest Ref Range: 65 - 99 mg/dL 200 (H) 186 (H) 254 (H)   Results for SUDEYS, PRUNER (MRN [id number]) as of 04/17/2016 10:05  Ref. Range 04/17/2016 07:29  Glucose-Capillary Latest Ref Range: 65 - 99 mg/dL 165 (H)    Admit with: Sepsis/ Pneumonia  History: DM, Dementia, ESRD, CVA  Home DM Meds: Levemir 15 units QHS       Novolog SSI QID       Januvia 25 mg daily  Current Insulin Orders: Novolog Moderate Correction Scale/ SSI (0-15 units) TID AC + HS      MD- Note patient currently receiving Solucortef 50 mg Q6 hours.    Please consider starting Levemir 8 units QHS (50% home dose)     --Will follow patient during hospitalization--  Wyn Quaker RN, MSN, CDE Diabetes Coordinator Inpatient Glycemic Control Team Team Pager: [phone] (8a-5p)

## 2016-04-17 NOTE — Progress Notes (Signed)
Start of hd 

## 2016-04-17 NOTE — Progress Notes (Signed)
Patient returned earlier from dialysis.  He was assisted up to the chair to eat

## 2016-04-17 NOTE — Progress Notes (Signed)
Post hd assessment 

## 2016-04-17 NOTE — Progress Notes (Signed)
Subjective:  Patient seen during dialysis Tolerating well    HEMODIALYSIS FLOWSHEET:  Blood Flow Rate (mL/min): 350 mL/min Arterial Pressure (mmHg): -180 mmHg Venous Pressure (mmHg): 130 mmHg Transmembrane Pressure (mmHg): 40 mmHg Ultrafiltration Rate (mL/min): 270 mL/min Dialysate Flow Rate (mL/min): 600 ml/min Conductivity: Machine : 13.9 Conductivity: Machine : 13.9 Dialysis Fluid Bolus: Normal Saline (prime) Bolus Amount (mL): 250 mL Dialysate Change:  (3k) Intra-Hemodialysis Comments: 330. alert, MD present.     Objective:  Vital signs in last 24 hours:  Temp:  [97.1 F (36.2 C)-98.4 F (36.9 C)] 97.9 F (36.6 C) (01/03 1020) Pulse Rate:  [55-71] 71 (01/03 1125) Resp:  [14-20] 14 (01/03 1125) BP: (85-146)/(39-78) 89/39 (01/03 1125) SpO2:  [81 %-100 %] 95 % (01/03 1125) Weight:  [78.3 kg (172 lb 9.6 oz)-79.3 kg (174 lb 13.2 oz)] 79.3 kg (174 lb 13.2 oz) (01/03 1020)  Weight change:  Filed Weights   04/15/16 1000 04/17/16 1008 04/17/16 1020  Weight: 75.5 kg (166 lb 7.2 oz) 78.3 kg (172 lb 9.6 oz) 79.3 kg (174 lb 13.2 oz)    Intake/Output:    Intake/Output Summary (Last 24 hours) at 04/17/16 1150 Last data filed at 04/17/16 1007  Gross per 24 hour  Intake              700 ml  Output                0 ml  Net              700 ml     Physical Exam: General: No acute distress, laying in the bed   HEENT Moist oral mucous membranes   Neck Supple   Pulm/lungs Normal breathing effort, clear   CVS/Heart No rub or gallop   Abdomen:  Soft nontender, nondistended   Extremities: Trace edema   Neurologic: Alert; able to answer questions  Skin: No acute rashes   Access: Left arm AV fistula        Basic Metabolic Panel:   Recent Labs Lab 04/12/16 2238 04/13/16 0452 04/14/16 0937  NA 139 136 134*  K 4.4 4.6 4.6  CL 98* 99* 97*  CO2 25 25 22   GLUCOSE 161* 240* 244*  BUN 45* 46* 66*  CREATININE 6.97* 6.77* 8.68*  CALCIUM 8.5* 7.8* 8.0*  MG  --  1.7   --   PHOS  --   --  4.6     CBC:  Recent Labs Lab 04/12/16 2238 04/13/16 0452 04/16/16 0452  WBC 19.5* 23.6* 9.9  NEUTROABS 17.9*  --   --   HGB 11.2* 10.5* 9.6*  HCT 34.2* 32.1* 28.7*  MCV 94.7 94.2 93.5  PLT 192 215 193      Microbiology:  Recent Results (from the past 720 hour(s))  Blood Culture (routine x 2)     Status: Abnormal   Collection Time: 04/12/16  9:27 PM  Result Value Ref Range Status   Specimen Description BLOOD RIGHT WRIST  Final   Special Requests BOTTLES DRAWN AEROBIC AND ANAEROBIC 4CCAERO,4CCANA  Final   Culture  Setup Time   Final    GRAM POSITIVE COCCI IN BOTH AEROBIC AND ANAEROBIC BOTTLES CRITICAL RESULT CALLED TO, READ BACK BY AND VERIFIED WITH: SHEEMA HALLAJI ON 04/13/16 AT 1614 [facility]    Culture (A)  Final    ENTEROCOCCUS FAECIUM STAPHYLOCOCCUS SPECIES (COAGULASE NEGATIVE) SUSCEPTIBILITIES PERFORMED ON PREVIOUS CULTURE WITHIN THE LAST 5 DAYS. Performed at [facility]    Report Status 04/16/2016  FINAL  Final   Organism ID, Bacteria ENTEROCOCCUS FAECIUM  Final      Susceptibility   Enterococcus faecium - MIC*    AMPICILLIN >=32 RESISTANT Resistant     VANCOMYCIN <=0.5 SENSITIVE Sensitive     GENTAMICIN SYNERGY SENSITIVE Sensitive     * ENTEROCOCCUS FAECIUM  Blood Culture ID Panel (Reflexed)     Status: Abnormal   Collection Time: 04/12/16  9:27 PM  Result Value Ref Range Status   Enterococcus species DETECTED (A) NOT DETECTED Final    Comment: CRITICAL RESULT CALLED TO, READ BACK BY AND VERIFIED WITH: SHEEMA HALLAJI ON 04/13/16 AT 1614 [place]    Vancomycin resistance NOT DETECTED NOT DETECTED Final   Listeria monocytogenes NOT DETECTED NOT DETECTED Final   Staphylococcus species NOT DETECTED NOT DETECTED Final   Staphylococcus aureus NOT DETECTED NOT DETECTED Final   Streptococcus species NOT DETECTED NOT DETECTED Final   Streptococcus agalactiae NOT DETECTED NOT DETECTED Final   Streptococcus pneumoniae NOT DETECTED NOT DETECTED  Final   Streptococcus pyogenes NOT DETECTED NOT DETECTED Final   Acinetobacter baumannii NOT DETECTED NOT DETECTED Final   Enterobacteriaceae species NOT DETECTED NOT DETECTED Final   Enterobacter cloacae complex NOT DETECTED NOT DETECTED Final   Escherichia coli NOT DETECTED NOT DETECTED Final   Klebsiella oxytoca NOT DETECTED NOT DETECTED Final   Klebsiella pneumoniae NOT DETECTED NOT DETECTED Final   Proteus species NOT DETECTED NOT DETECTED Final   Serratia marcescens NOT DETECTED NOT DETECTED Final   Haemophilus influenzae NOT DETECTED NOT DETECTED Final   Neisseria meningitidis NOT DETECTED NOT DETECTED Final   Pseudomonas aeruginosa NOT DETECTED NOT DETECTED Final   Candida albicans NOT DETECTED NOT DETECTED Final   Candida glabrata NOT DETECTED NOT DETECTED Final   Candida krusei NOT DETECTED NOT DETECTED Final   Candida parapsilosis NOT DETECTED NOT DETECTED Final   Candida tropicalis NOT DETECTED NOT DETECTED Final  Blood Culture (routine x 2)     Status: Abnormal   Collection Time: 04/12/16 10:37 PM  Result Value Ref Range Status   Specimen Description BLOOD CENTRAL LINE  Final   Special Requests BOTTLES DRAWN AEROBIC AND ANAEROBIC 6CCAERO,3CCANA  Final   Culture  Setup Time   Final    GRAM POSITIVE COCCI ANAEROBIC BOTTLE ONLY CRITICAL VALUE NOTED.  VALUE IS CONSISTENT WITH PREVIOUSLY REPORTED AND CALLED VALUE. GRAM POSITIVE RODS AEROBIC BOTTLE ONLY CRITICAL RESULT CALLED TO, READ BACK BY AND VERIFIED WITH: Sheema Hallaji @ [id number] 04/14/16 by [facility]    Culture (A)  Final    STAPHYLOCOCCUS SPECIES (COAGULASE NEGATIVE) DIPHTHEROIDS(CORYNEBACTERIUM SPECIES) Standardized susceptibility testing for this organism is not available. Performed at [facility]    Report Status 04/16/2016 FINAL  Final   Organism ID, Bacteria STAPHYLOCOCCUS SPECIES (COAGULASE NEGATIVE)  Final      Susceptibility   Staphylococcus species (coagulase negative) - MIC*    CIPROFLOXACIN >=8  RESISTANT Resistant     ERYTHROMYCIN >=8 RESISTANT Resistant     GENTAMICIN <=0.5 SENSITIVE Sensitive     OXACILLIN >=4 RESISTANT Resistant     TETRACYCLINE >=16 RESISTANT Resistant     VANCOMYCIN 2 SENSITIVE Sensitive     TRIMETH/SULFA 80 RESISTANT Resistant     CLINDAMYCIN <=0.25 SENSITIVE Sensitive     RIFAMPIN <=0.5 SENSITIVE Sensitive     Inducible Clindamycin NEGATIVE Sensitive     * STAPHYLOCOCCUS SPECIES (COAGULASE NEGATIVE)  Blood Culture ID Panel (Reflexed)     Status: None   Collection Time: 04/12/16 10:37  PM  Result Value Ref Range Status   Enterococcus species NOT DETECTED NOT DETECTED Final   Listeria monocytogenes NOT DETECTED NOT DETECTED Final   Staphylococcus species NOT DETECTED NOT DETECTED Final   Staphylococcus aureus NOT DETECTED NOT DETECTED Final   Streptococcus species NOT DETECTED NOT DETECTED Final   Streptococcus agalactiae NOT DETECTED NOT DETECTED Final   Streptococcus pneumoniae NOT DETECTED NOT DETECTED Final   Streptococcus pyogenes NOT DETECTED NOT DETECTED Final   Acinetobacter baumannii NOT DETECTED NOT DETECTED Final   Enterobacteriaceae species NOT DETECTED NOT DETECTED Final   Enterobacter cloacae complex NOT DETECTED NOT DETECTED Final   Escherichia coli NOT DETECTED NOT DETECTED Final   Klebsiella oxytoca NOT DETECTED NOT DETECTED Final   Klebsiella pneumoniae NOT DETECTED NOT DETECTED Final   Proteus species NOT DETECTED NOT DETECTED Final   Serratia marcescens NOT DETECTED NOT DETECTED Final   Haemophilus influenzae NOT DETECTED NOT DETECTED Final   Neisseria meningitidis NOT DETECTED NOT DETECTED Final   Pseudomonas aeruginosa NOT DETECTED NOT DETECTED Final   Candida albicans NOT DETECTED NOT DETECTED Final   Candida glabrata NOT DETECTED NOT DETECTED Final   Candida krusei NOT DETECTED NOT DETECTED Final   Candida parapsilosis NOT DETECTED NOT DETECTED Final   Candida tropicalis NOT DETECTED NOT DETECTED Final  MRSA PCR Screening      Status: None   Collection Time: 04/13/16  2:24 AM  Result Value Ref Range Status   MRSA by PCR NEGATIVE NEGATIVE Final    Comment:        The GeneXpert MRSA Assay (FDA approved for NASAL specimens only), is one component of a comprehensive MRSA colonization surveillance program. It is not intended to diagnose MRSA infection nor to guide or monitor treatment for MRSA infections.   Culture, blood (single) w Reflex to ID Panel     Status: None (Preliminary result)   Collection Time: 04/14/16  6:06 PM  Result Value Ref Range Status   Specimen Description BLOOD RIGHT ARM  Final   Special Requests   Final    BOTTLES DRAWN AEROBIC AND ANAEROBIC ANA 6CC AER [place]   Culture NO GROWTH 3 DAYS  Final   Report Status PENDING  Incomplete    Coagulation Studies: No results for input(s): LABPROT, INR in the last 72 hours.  Urinalysis: No results for input(s): COLORURINE, LABSPEC, PHURINE, GLUCOSEU, HGBUR, BILIRUBINUR, KETONESUR, PROTEINUR, UROBILINOGEN, NITRITE, LEUKOCYTESUR in the last 72 hours.  Invalid input(s): APPERANCEUR    Imaging: No results found.   Medications:    . atorvastatin  20 mg Oral Daily  . gabapentin  100 mg Oral QHS  . heparin  5,000 Units Subcutaneous Q8H  . hydrocortisone sod succinate (SOLU-CORTEF) inj  50 mg Intravenous Q6H  . insulin aspart  0-15 Units Subcutaneous TID WC  . insulin aspart  0-5 Units Subcutaneous QHS  . lamoTRIgine  100 mg Oral QHS  . levETIRAcetam  500 mg Oral Daily  . levofloxacin (LEVAQUIN) IV  500 mg Intravenous Q48H  . midodrine  5 mg Oral TID WC  . pantoprazole  40 mg Oral Daily  . sevelamer carbonate  800 mg Oral TID WC  . vancomycin  750 mg Intravenous Q M,W,F-HD   sodium chloride, acetaminophen, docusate sodium, HYDROcodone-acetaminophen, ipratropium-albuterol, ondansetron (ZOFRAN) IV  Assessment/ Plan:  83 y.o. African-American male with end-stage renal disease on hemodialysis, hypertension, diabetes type 2,  insulin-dependent, seizure disorder, peripheral neuropathy, vascular dementia  [facility] [place] Left arm AVF  1. End Stage Renal Disease:  Patient seen during dialysis Tolerating well  2. Sepsis with hypotension: Enterobacter species Antibiotics as per primary team - off norepinephrine and vasopressin  3. Anemia of chronic kidney disease: hemoglobin 9.6 Mircera as outpatient. Hold epo for now.   4. Secondary Hyperparathyroidism: corrected calcium at goal.  - sevelamer    LOS: 4 Babbette Dalesandro 1/3/201811:50 AM

## 2016-04-17 NOTE — Progress Notes (Signed)
Dr Tressia Miners gave orders to remove femoral line

## 2016-04-17 NOTE — Progress Notes (Signed)
[place] INFECTIOUS DISEASE PROGRESS NOTE Date of Admission:  04/12/2016     ID: YAHIA GOHN is a 83 y.o. male with PNA and bacteremia Active Problems:   CAP (community acquired pneumonia)  Subjective: Out of unit, no fevers Improving  ROS  Eleven systems are reviewed and negative except per hpi  Medications:  Antibiotics Given (last 72 hours)    Date/Time Action Medication Dose Rate   04/14/16 1710 Given   levofloxacin (LEVAQUIN) IVPB 500 mg 500 mg 100 mL/hr   04/15/16 1627 Given   vancomycin (VANCOCIN) IVPB 750 mg/150 ml premix 750 mg 150 mL/hr   04/16/16 1804 Given   levofloxacin (LEVAQUIN) IVPB 500 mg 500 mg 100 mL/hr   04/17/16 1259 Given   vancomycin (VANCOCIN) IVPB 750 mg/150 ml premix 750 mg 150 mL/hr     . atorvastatin  20 mg Oral Daily  . gabapentin  100 mg Oral QHS  . heparin  5,000 Units Subcutaneous Q8H  . hydrocortisone sod succinate (SOLU-CORTEF) inj  50 mg Intravenous Q6H  . insulin aspart  0-15 Units Subcutaneous TID WC  . insulin aspart  0-5 Units Subcutaneous QHS  . lamoTRIgine  100 mg Oral QHS  . levETIRAcetam  500 mg Oral Daily  . levofloxacin (LEVAQUIN) IV  500 mg Intravenous Q48H  . midodrine  5 mg Oral TID WC  . pantoprazole  40 mg Oral Daily  . sevelamer carbonate  800 mg Oral TID WC  . vancomycin  750 mg Intravenous Q M,W,F-HD    Objective: Vital signs in last 24 hours: Temp:  [97.1 F (36.2 C)-98.4 F (36.9 C)] 97.9 F (36.6 C) (01/03 1020) Pulse Rate:  [55-73] 73 (01/03 1255) Resp:  [13-20] 18 (01/03 1255) BP: (85-146)/(37-78) 98/37 (01/03 1255) SpO2:  [81 %-100 %] 96 % (01/03 1255) Weight:  [78.3 kg (172 lb 9.6 oz)-79.3 kg (174 lb 13.2 oz)] 79.3 kg (174 lb 13.2 oz) (01/03 1020) Constitutional: He is oriented to person, place, and time. He appears well-developed and well-nourished. No distress. obese HENT: anicteric Mouth/Throat: Oropharynx is clear and dry. No oropharyngeal exudate.  Cardiovascular: Normal rate, regular  rhythm and normal heart sounds.   Pulmonary/Chest:poor air movement Abdominal: Soft. Bowel sounds are normal. He exhibits no distension. There is no tenderness.  Lymphadenopathy: He has no cervical adenopathy.  Neurological: He is alert and oriented to person, place, and time.  LUE AVF site wnl  Skin: Skin is warm and dry. No rash noted. No erythema.  Psychiatric: He has a normal mood and affect. His behavior is normal.   Lab Results  Recent Labs  04/16/16 0452 04/17/16 1030  WBC 9.9  --   HGB 9.6*  --   HCT 28.7*  --   NA  --  136  K  --  4.0  CL  --  97*  CO2  --  25  BUN  --  74*  CREATININE  --  8.22*    Microbiology: Results for orders placed or performed during the hospital encounter of 04/12/16  Blood Culture (routine x 2)     Status: Abnormal   Collection Time: 04/12/16  9:27 PM  Result Value Ref Range Status   Specimen Description BLOOD RIGHT WRIST  Final   Special Requests BOTTLES DRAWN AEROBIC AND ANAEROBIC 4CCAERO,4CCANA  Final   Culture  Setup Time   Final    GRAM POSITIVE COCCI IN BOTH AEROBIC AND ANAEROBIC BOTTLES CRITICAL RESULT CALLED TO, READ BACK BY AND VERIFIED WITH:  SHEEMA HALLAJI ON 04/13/16 AT [id number] [place]    Culture (A)  Final    ENTEROCOCCUS FAECIUM STAPHYLOCOCCUS SPECIES (COAGULASE NEGATIVE) SUSCEPTIBILITIES PERFORMED ON PREVIOUS CULTURE WITHIN THE LAST 5 DAYS. Performed at [facility]    Report Status 04/16/2016 FINAL  Final   Organism ID, Bacteria ENTEROCOCCUS FAECIUM  Final      Susceptibility   Enterococcus faecium - MIC*    AMPICILLIN >=32 RESISTANT Resistant     VANCOMYCIN <=0.5 SENSITIVE Sensitive     GENTAMICIN SYNERGY SENSITIVE Sensitive     * ENTEROCOCCUS FAECIUM  Blood Culture ID Panel (Reflexed)     Status: Abnormal   Collection Time: 04/12/16  9:27 PM  Result Value Ref Range Status   Enterococcus species DETECTED (A) NOT DETECTED Final    Comment: CRITICAL RESULT CALLED TO, READ BACK BY AND VERIFIED WITH: SHEEMA  HALLAJI ON 04/13/16 AT 1614 [place]    Vancomycin resistance NOT DETECTED NOT DETECTED Final   Listeria monocytogenes NOT DETECTED NOT DETECTED Final   Staphylococcus species NOT DETECTED NOT DETECTED Final   Staphylococcus aureus NOT DETECTED NOT DETECTED Final   Streptococcus species NOT DETECTED NOT DETECTED Final   Streptococcus agalactiae NOT DETECTED NOT DETECTED Final   Streptococcus pneumoniae NOT DETECTED NOT DETECTED Final   Streptococcus pyogenes NOT DETECTED NOT DETECTED Final   Acinetobacter baumannii NOT DETECTED NOT DETECTED Final   Enterobacteriaceae species NOT DETECTED NOT DETECTED Final   Enterobacter cloacae complex NOT DETECTED NOT DETECTED Final   Escherichia coli NOT DETECTED NOT DETECTED Final   Klebsiella oxytoca NOT DETECTED NOT DETECTED Final   Klebsiella pneumoniae NOT DETECTED NOT DETECTED Final   Proteus species NOT DETECTED NOT DETECTED Final   Serratia marcescens NOT DETECTED NOT DETECTED Final   Haemophilus influenzae NOT DETECTED NOT DETECTED Final   Neisseria meningitidis NOT DETECTED NOT DETECTED Final   Pseudomonas aeruginosa NOT DETECTED NOT DETECTED Final   Candida albicans NOT DETECTED NOT DETECTED Final   Candida glabrata NOT DETECTED NOT DETECTED Final   Candida krusei NOT DETECTED NOT DETECTED Final   Candida parapsilosis NOT DETECTED NOT DETECTED Final   Candida tropicalis NOT DETECTED NOT DETECTED Final  Blood Culture (routine x 2)     Status: Abnormal   Collection Time: 04/12/16 10:37 PM  Result Value Ref Range Status   Specimen Description BLOOD CENTRAL LINE  Final   Special Requests BOTTLES DRAWN AEROBIC AND ANAEROBIC 6CCAERO,3CCANA  Final   Culture  Setup Time   Final    GRAM POSITIVE COCCI ANAEROBIC BOTTLE ONLY CRITICAL VALUE NOTED.  VALUE IS CONSISTENT WITH PREVIOUSLY REPORTED AND CALLED VALUE. GRAM POSITIVE RODS AEROBIC BOTTLE ONLY CRITICAL RESULT CALLED TO, READ BACK BY AND VERIFIED WITH: Sheema Hallaji @ [id number] 04/14/16 by [facility]     Culture (A)  Final    STAPHYLOCOCCUS SPECIES (COAGULASE NEGATIVE) DIPHTHEROIDS(CORYNEBACTERIUM SPECIES) Standardized susceptibility testing for this organism is not available. Performed at [facility]    Report Status 04/16/2016 FINAL  Final   Organism ID, Bacteria STAPHYLOCOCCUS SPECIES (COAGULASE NEGATIVE)  Final      Susceptibility   Staphylococcus species (coagulase negative) - MIC*    CIPROFLOXACIN >=8 RESISTANT Resistant     ERYTHROMYCIN >=8 RESISTANT Resistant     GENTAMICIN <=0.5 SENSITIVE Sensitive     OXACILLIN >=4 RESISTANT Resistant     TETRACYCLINE >=16 RESISTANT Resistant     VANCOMYCIN 2 SENSITIVE Sensitive     TRIMETH/SULFA 80 RESISTANT Resistant     CLINDAMYCIN <=0.25 SENSITIVE  Sensitive     RIFAMPIN <=0.5 SENSITIVE Sensitive     Inducible Clindamycin NEGATIVE Sensitive     * STAPHYLOCOCCUS SPECIES (COAGULASE NEGATIVE)  Blood Culture ID Panel (Reflexed)     Status: None   Collection Time: 04/12/16 10:37 PM  Result Value Ref Range Status   Enterococcus species NOT DETECTED NOT DETECTED Final   Listeria monocytogenes NOT DETECTED NOT DETECTED Final   Staphylococcus species NOT DETECTED NOT DETECTED Final   Staphylococcus aureus NOT DETECTED NOT DETECTED Final   Streptococcus species NOT DETECTED NOT DETECTED Final   Streptococcus agalactiae NOT DETECTED NOT DETECTED Final   Streptococcus pneumoniae NOT DETECTED NOT DETECTED Final   Streptococcus pyogenes NOT DETECTED NOT DETECTED Final   Acinetobacter baumannii NOT DETECTED NOT DETECTED Final   Enterobacteriaceae species NOT DETECTED NOT DETECTED Final   Enterobacter cloacae complex NOT DETECTED NOT DETECTED Final   Escherichia coli NOT DETECTED NOT DETECTED Final   Klebsiella oxytoca NOT DETECTED NOT DETECTED Final   Klebsiella pneumoniae NOT DETECTED NOT DETECTED Final   Proteus species NOT DETECTED NOT DETECTED Final   Serratia marcescens NOT DETECTED NOT DETECTED Final   Haemophilus influenzae NOT  DETECTED NOT DETECTED Final   Neisseria meningitidis NOT DETECTED NOT DETECTED Final   Pseudomonas aeruginosa NOT DETECTED NOT DETECTED Final   Candida albicans NOT DETECTED NOT DETECTED Final   Candida glabrata NOT DETECTED NOT DETECTED Final   Candida krusei NOT DETECTED NOT DETECTED Final   Candida parapsilosis NOT DETECTED NOT DETECTED Final   Candida tropicalis NOT DETECTED NOT DETECTED Final  MRSA PCR Screening     Status: None   Collection Time: 04/13/16  2:24 AM  Result Value Ref Range Status   MRSA by PCR NEGATIVE NEGATIVE Final    Comment:        The GeneXpert MRSA Assay (FDA approved for NASAL specimens only), is one component of a comprehensive MRSA colonization surveillance program. It is not intended to diagnose MRSA infection nor to guide or monitor treatment for MRSA infections.   Culture, blood (single) w Reflex to ID Panel     Status: None (Preliminary result)   Collection Time: 04/14/16  6:06 PM  Result Value Ref Range Status   Specimen Description BLOOD RIGHT ARM  Final   Special Requests   Final    BOTTLES DRAWN AEROBIC AND ANAEROBIC ANA 6CC AER [place]   Culture NO GROWTH 3 DAYS  Final   Report Status PENDING  Incomplete    Studies/Results: - Procedure narrative: Transthoracic echocardiography. The study   was technically difficult, as a result of restricted patient   mobility. - Left ventricle: Wall thickness was increased in a pattern of mild   LVH. There was mild focal basal hypertrophy of the septum.   Systolic function was normal. The estimated ejection fraction was   in the range of 55% to 60%. Doppler parameters are consistent   with abnormal left ventricular relaxation (grade 1 diastolic   dysfunction). - Aortic valve: Valve area (VTI): 3.12 cm^2. Valve area (Vmax):   2.05 cm^2. Valve area (Vmean): 2.33 cm^2.  Assessment/Plan: ALBEIRO PERALES is a 83 y.o. male with ESRD admitted with fevers, hypotension, leukocytosis and found to have cxr  evidence PNA and gram positive mixed bacteremia.  He has HD through a L arm AVF but this appears normal. No GI or GU sxs. Clinically improving. Unclear source of the isolated enterococcal bacteremia in 1/2 sets. He also had CNS with the enterococcus and another  bcx with CNS and diphteroids.  Repeat bcx neg and echo neg Clinically much improved and transferring out of unit today  I suspect his real process is the PNA and the multiple bacteria in his cultures are contaminant although enterococcus is not usually thought of as a contaminant.   Recommendations At this point would rec a 10 day course of treatment - would rec vanco (he can get at HD) and levofloxacin until 1/10 then stopping abx and following his clinical course He can follow at Dialysis and no need for me to see him in follow up unless infection recurs  Thank you very much for the consult. Will follow with you.  Kataleyah Carducci P   04/17/2016, 1:09 PM

## 2016-04-18 LAB — GLUCOSE, CAPILLARY
Glucose-Capillary: 155 mg/dL — ABNORMAL HIGH (ref 65–99)
Glucose-Capillary: 228 mg/dL — ABNORMAL HIGH (ref 65–99)

## 2016-04-18 MED ORDER — MIDODRINE HCL 5 MG PO TABS: 5.0000 mg | ORAL_TABLET | Freq: Three times a day (TID) | ORAL | 3 refills | Status: AC

## 2016-04-18 MED ORDER — LEVOFLOXACIN 500 MG PO TABS: 500.0000 mg | ORAL_TABLET | ORAL | 0 refills | Status: DC

## 2016-04-18 MED ORDER — VANCOMYCIN HCL IN DEXTROSE 750-5 MG/150ML-% IV SOLN: 750.0000 mg | INTRAVENOUS | 0 refills | Status: DC

## 2016-04-18 MED ORDER — HYDROCODONE-ACETAMINOPHEN 5-325 MG PO TABS: 1.0000 | ORAL_TABLET | Freq: Four times a day (QID) | ORAL | 0 refills | Status: DC | PRN

## 2016-04-18 NOTE — Plan of Care (Signed)
Problem: Bowel/Gastric: Goal: Will not experience complications related to bowel motility Outcome: Progressing Patient educated on the need to have a bowel regiment

## 2016-04-18 NOTE — Progress Notes (Signed)
Subjective:  Doing well Feels ready for discharge 0.5 kg removed with HD due to low BP Denies SOB   Objective:  Vital signs in last 24 hours:  Temp:  [97.4 F (36.3 C)-98.5 F (36.9 C)] 97.4 F (36.3 C) (01/04 1047) Pulse Rate:  [59-73] 59 (01/04 1047) Resp:  [13-20] 20 (01/04 1047) BP: (88-131)/(20-66) 100/49 (01/04 1047) SpO2:  [92 %-100 %] 100 % (01/04 0958) Weight:  [76.5 kg (168 lb 11.2 oz)-78.7 kg (173 lb 8 oz)] 76.5 kg (168 lb 11.2 oz) (01/03 1700)  Weight change:  Filed Weights   04/17/16 1020 04/17/16 1355 04/17/16 1700  Weight: 79.3 kg (174 lb 13.2 oz) 78.7 kg (173 lb 8 oz) 76.5 kg (168 lb 11.2 oz)    Intake/Output:    Intake/Output Summary (Last 24 hours) at 04/18/16 1254 Last data filed at 04/18/16 1015  Gross per 24 hour  Intake              120 ml  Output              500 ml  Net             -380 ml     Physical Exam: General: No acute distress, laying in the bed   HEENT Moist oral mucous membranes   Neck Supple   Pulm/lungs Normal breathing effort, clear   CVS/Heart No rub or gallop   Abdomen:  Soft nontender, nondistended   Extremities: Trace edema   Neurologic: Alert; able to answer questions  Skin: No acute rashes   Access: Left arm AV fistula        Basic Metabolic Panel:   Recent Labs Lab 04/12/16 2238 04/13/16 0452 04/14/16 0937 04/17/16 1030  NA 139 136 134* 136  K 4.4 4.6 4.6 4.0  CL 98* 99* 97* 97*  CO2 25 25 22 25   GLUCOSE 161* 240* 244* 241*  BUN 45* 46* 66* 74*  CREATININE 6.97* 6.77* 8.68* 8.22*  CALCIUM 8.5* 7.8* 8.0* 7.7*  MG  --  1.7  --   --   PHOS  --   --  4.6 4.8*     CBC:  Recent Labs Lab 04/12/16 2238 04/13/16 0452 04/16/16 0452  WBC 19.5* 23.6* 9.9  NEUTROABS 17.9*  --   --   HGB 11.2* 10.5* 9.6*  HCT 34.2* 32.1* 28.7*  MCV 94.7 94.2 93.5  PLT 192 215 193      Microbiology:  Recent Results (from the past 720 hour(s))  Blood Culture (routine x 2)     Status: Abnormal   Collection Time:  04/12/16  9:27 PM  Result Value Ref Range Status   Specimen Description BLOOD RIGHT WRIST  Final   Special Requests BOTTLES DRAWN AEROBIC AND ANAEROBIC 4CCAERO,4CCANA  Final   Culture  Setup Time   Final    GRAM POSITIVE COCCI IN BOTH AEROBIC AND ANAEROBIC BOTTLES CRITICAL RESULT CALLED TO, READ BACK BY AND VERIFIED WITH: SHEEMA HALLAJI ON 04/13/16 AT 1614 [facility]    Culture (A)  Final    ENTEROCOCCUS FAECIUM STAPHYLOCOCCUS SPECIES (COAGULASE NEGATIVE) SUSCEPTIBILITIES PERFORMED ON PREVIOUS CULTURE WITHIN THE LAST 5 DAYS. Performed at [facility]    Report Status 04/16/2016 FINAL  Final   Organism ID, Bacteria ENTEROCOCCUS FAECIUM  Final      Susceptibility   Enterococcus faecium - MIC*    AMPICILLIN >=32 RESISTANT Resistant     VANCOMYCIN <=0.5 SENSITIVE Sensitive     GENTAMICIN SYNERGY SENSITIVE Sensitive     *  ENTEROCOCCUS FAECIUM  Blood Culture ID Panel (Reflexed)     Status: Abnormal   Collection Time: 04/12/16  9:27 PM  Result Value Ref Range Status   Enterococcus species DETECTED (A) NOT DETECTED Final    Comment: CRITICAL RESULT CALLED TO, READ BACK BY AND VERIFIED WITH: SHEEMA HALLAJI ON 04/13/16 AT 1614 [place]    Vancomycin resistance NOT DETECTED NOT DETECTED Final   Listeria monocytogenes NOT DETECTED NOT DETECTED Final   Staphylococcus species NOT DETECTED NOT DETECTED Final   Staphylococcus aureus NOT DETECTED NOT DETECTED Final   Streptococcus species NOT DETECTED NOT DETECTED Final   Streptococcus agalactiae NOT DETECTED NOT DETECTED Final   Streptococcus pneumoniae NOT DETECTED NOT DETECTED Final   Streptococcus pyogenes NOT DETECTED NOT DETECTED Final   Acinetobacter baumannii NOT DETECTED NOT DETECTED Final   Enterobacteriaceae species NOT DETECTED NOT DETECTED Final   Enterobacter cloacae complex NOT DETECTED NOT DETECTED Final   Escherichia coli NOT DETECTED NOT DETECTED Final   Klebsiella oxytoca NOT DETECTED NOT DETECTED Final   Klebsiella pneumoniae  NOT DETECTED NOT DETECTED Final   Proteus species NOT DETECTED NOT DETECTED Final   Serratia marcescens NOT DETECTED NOT DETECTED Final   Haemophilus influenzae NOT DETECTED NOT DETECTED Final   Neisseria meningitidis NOT DETECTED NOT DETECTED Final   Pseudomonas aeruginosa NOT DETECTED NOT DETECTED Final   Candida albicans NOT DETECTED NOT DETECTED Final   Candida glabrata NOT DETECTED NOT DETECTED Final   Candida krusei NOT DETECTED NOT DETECTED Final   Candida parapsilosis NOT DETECTED NOT DETECTED Final   Candida tropicalis NOT DETECTED NOT DETECTED Final  Blood Culture (routine x 2)     Status: Abnormal   Collection Time: 04/12/16 10:37 PM  Result Value Ref Range Status   Specimen Description BLOOD CENTRAL LINE  Final   Special Requests BOTTLES DRAWN AEROBIC AND ANAEROBIC 6CCAERO,3CCANA  Final   Culture  Setup Time   Final    GRAM POSITIVE COCCI ANAEROBIC BOTTLE ONLY CRITICAL VALUE NOTED.  VALUE IS CONSISTENT WITH PREVIOUSLY REPORTED AND CALLED VALUE. GRAM POSITIVE RODS AEROBIC BOTTLE ONLY CRITICAL RESULT CALLED TO, READ BACK BY AND VERIFIED WITH: Sheema Hallaji @ [id number] 04/14/16 by [facility]    Culture (A)  Final    STAPHYLOCOCCUS SPECIES (COAGULASE NEGATIVE) DIPHTHEROIDS(CORYNEBACTERIUM SPECIES) Standardized susceptibility testing for this organism is not available. Performed at [facility]    Report Status 04/16/2016 FINAL  Final   Organism ID, Bacteria STAPHYLOCOCCUS SPECIES (COAGULASE NEGATIVE)  Final      Susceptibility   Staphylococcus species (coagulase negative) - MIC*    CIPROFLOXACIN >=8 RESISTANT Resistant     ERYTHROMYCIN >=8 RESISTANT Resistant     GENTAMICIN <=0.5 SENSITIVE Sensitive     OXACILLIN >=4 RESISTANT Resistant     TETRACYCLINE >=16 RESISTANT Resistant     VANCOMYCIN 2 SENSITIVE Sensitive     TRIMETH/SULFA 80 RESISTANT Resistant     CLINDAMYCIN <=0.25 SENSITIVE Sensitive     RIFAMPIN <=0.5 SENSITIVE Sensitive     Inducible Clindamycin NEGATIVE  Sensitive     * STAPHYLOCOCCUS SPECIES (COAGULASE NEGATIVE)  Blood Culture ID Panel (Reflexed)     Status: None   Collection Time: 04/12/16 10:37 PM  Result Value Ref Range Status   Enterococcus species NOT DETECTED NOT DETECTED Final   Listeria monocytogenes NOT DETECTED NOT DETECTED Final   Staphylococcus species NOT DETECTED NOT DETECTED Final   Staphylococcus aureus NOT DETECTED NOT DETECTED Final   Streptococcus species NOT DETECTED NOT DETECTED Final  Streptococcus agalactiae NOT DETECTED NOT DETECTED Final   Streptococcus pneumoniae NOT DETECTED NOT DETECTED Final   Streptococcus pyogenes NOT DETECTED NOT DETECTED Final   Acinetobacter baumannii NOT DETECTED NOT DETECTED Final   Enterobacteriaceae species NOT DETECTED NOT DETECTED Final   Enterobacter cloacae complex NOT DETECTED NOT DETECTED Final   Escherichia coli NOT DETECTED NOT DETECTED Final   Klebsiella oxytoca NOT DETECTED NOT DETECTED Final   Klebsiella pneumoniae NOT DETECTED NOT DETECTED Final   Proteus species NOT DETECTED NOT DETECTED Final   Serratia marcescens NOT DETECTED NOT DETECTED Final   Haemophilus influenzae NOT DETECTED NOT DETECTED Final   Neisseria meningitidis NOT DETECTED NOT DETECTED Final   Pseudomonas aeruginosa NOT DETECTED NOT DETECTED Final   Candida albicans NOT DETECTED NOT DETECTED Final   Candida glabrata NOT DETECTED NOT DETECTED Final   Candida krusei NOT DETECTED NOT DETECTED Final   Candida parapsilosis NOT DETECTED NOT DETECTED Final   Candida tropicalis NOT DETECTED NOT DETECTED Final  MRSA PCR Screening     Status: None   Collection Time: 04/13/16  2:24 AM  Result Value Ref Range Status   MRSA by PCR NEGATIVE NEGATIVE Final    Comment:        The GeneXpert MRSA Assay (FDA approved for NASAL specimens only), is one component of a comprehensive MRSA colonization surveillance program. It is not intended to diagnose MRSA infection nor to guide or monitor treatment for MRSA  infections.   Culture, blood (single) w Reflex to ID Panel     Status: None (Preliminary result)   Collection Time: 04/14/16  6:06 PM  Result Value Ref Range Status   Specimen Description BLOOD RIGHT ARM  Final   Special Requests   Final    BOTTLES DRAWN AEROBIC AND ANAEROBIC ANA 6CC AER [place]   Culture NO GROWTH 4 DAYS  Final   Report Status PENDING  Incomplete    Coagulation Studies: No results for input(s): LABPROT, INR in the last 72 hours.  Urinalysis: No results for input(s): COLORURINE, LABSPEC, PHURINE, GLUCOSEU, HGBUR, BILIRUBINUR, KETONESUR, PROTEINUR, UROBILINOGEN, NITRITE, LEUKOCYTESUR in the last 72 hours.  Invalid input(s): APPERANCEUR    Imaging: No results found.   Medications:    . atorvastatin  20 mg Oral Daily  . gabapentin  100 mg Oral QHS  . heparin  5,000 Units Subcutaneous Q8H  . insulin aspart  0-15 Units Subcutaneous TID WC  . insulin aspart  0-5 Units Subcutaneous QHS  . insulin glargine  10 Units Subcutaneous QHS  . lamoTRIgine  100 mg Oral QHS  . levETIRAcetam  500 mg Oral Daily  . levofloxacin  500 mg Oral Q48H  . midodrine  5 mg Oral TID WC  . pantoprazole  40 mg Oral Daily  . polyethylene glycol  17 g Oral Daily  . sevelamer carbonate  800 mg Oral TID WC  . vancomycin  750 mg Intravenous Q M,W,F-HD   sodium chloride, acetaminophen, docusate sodium, HYDROcodone-acetaminophen, ipratropium-albuterol, ondansetron (ZOFRAN) IV, polyethylene glycol  Assessment/ Plan:  83 y.o. African-American male with end-stage renal disease on hemodialysis, hypertension, diabetes type 2, insulin-dependent, seizure disorder, peripheral neuropathy, vascular dementia  MWF [facility] Left arm AVF  1. End Stage Renal Disease:  Continue dialysis as outpatient  2. Sepsis with hypotension: Enterobacter species Antibiotics as per primary team - VANC called in to [facility] [place] x 6 doses of 750 mg each  3. Anemia of chronic kidney disease:  hemoglobin 9.6 Mircera as outpatient.  [name]  4. Secondary Hyperparathyroidism:   - sevelamer    LOS: 5 Avett Reineck 1/4/201812:54 PM

## 2016-04-18 NOTE — Clinical Social Work Note (Signed)
Physician to discharge patient today. CSW contacted patient's wife and let her know that he would return to [facility] today and would go by EMS. Shela Leff MSW,LCSW [phone]

## 2016-04-18 NOTE — Care Management Important Message (Signed)
Important Message  Patient Details  Name: Michael Acosta MRN: [id number] Date of Birth: June 03, 1929   Medicare Important Message Given:  Yes    Beverly Sessions, RN 04/18/2016, 2:00 PM

## 2016-04-18 NOTE — Discharge Summary (Signed)
[place] [facility] [place] NAME: Michael Acosta    MR#:  [id number]  DATE OF BIRTH:  April 20, 1929  DATE OF ADMISSION:  04/12/2016   ADMITTING PHYSICIAN: Flora Lipps, MD  DATE OF DISCHARGE: 04/18/16  PRIMARY CARE PHYSICIAN: Marden Noble, MD   ADMISSION DIAGNOSIS:   ESRD on dialysis ([place]) [N18.6, Z99.2] Sepsis, due to unspecified organism ([place]) [A41.9] Hypotension, unspecified hypotension type [I95.9] Pneumonia of left lung due to infectious organism, unspecified part of lung [J18.9]  DISCHARGE DIAGNOSIS:   Active Problems:   CAP (community acquired pneumonia)   SECONDARY DIAGNOSIS:   Past Medical History:  Diagnosis Date  . Anemia   . CVA (cerebral infarction)   . Dementia   . Depression   . Diabetes mellitus without complication ([place])   . Dialysis patient ([place])   . Dysrhythmia    BRADYCARDIA  . Falls frequently   . GERD (gastroesophageal reflux disease)   . Hypertension   . Mood disorder ([place])   . Neuropathy ([place])   . Pain    CHRONIC  . Peripheral vascular disease ([place])   . Prostate cancer ([place])   . Renal disorder    ESRD  . Seizures ([place])    EPILEPSY  . Stroke [facility])    TIA  . Weakness generalized     HOSPITAL COURSE:   83 year old male with past medical history significant for end-stage renal disease on Monday Wednesday Friday dialysis, chronic hypotension, anemia, PVD, diabetes, depression presents to hospital secondary to weakness and noted to have bacteremia.  #1 sepsis-secondary to community-acquired pneumonia. -Blood cultures growing enterococcus and staphylococcus likely coagulase-negative. -Appreciate ID consult. Patient is currently on vancomycin with dialysis for 10 days and also oral Levaquin  -Physical therapy consulted.  #2 chronic hypotension-received Solu-Cortef and now off of it - oral midodrine added  #3 seizure disorder-continue Keppra and Lamictal. Stable otherwise.  #4 diabetes  mellitus- continue home dose of lantus and also sliding scale prior to meals  #5 GERD-Protonix   #6 Hypoxia- from pneumonia and has chronic diastolic CHF 2L o2 as needed, wean as tolerated   Physical therapy. Patient will be discharged back to [facility] care   DISCHARGE CONDITIONS:   Guarded  CONSULTS OBTAINED:   Treatment Team:  Lavonia Dana, MD Leonel Ramsay, MD  DRUG ALLERGIES:   Allergies  Allergen Reactions  . Aspirin   . Bc Powder [Aspirin-Salicylamide-Caffeine]   . Penicillins Other (See Comments)    MADE HER BLIND   DISCHARGE MEDICATIONS:   Allergies as of 04/18/2016      Reactions   Aspirin    Bc Powder [aspirin-salicylamide-caffeine]    Penicillins Other (See Comments)   MADE HER BLIND      Medication List    TAKE these medications   atorvastatin 20 MG tablet Commonly known as:  LIPITOR Take 20 mg by mouth daily.   docusate sodium 100 MG capsule Commonly known as:  COLACE Take 100 mg by mouth 2 (two) times daily as needed for mild constipation.   gabapentin 100 MG capsule Commonly known as:  NEURONTIN Take 100 mg by mouth at bedtime.   guaifenesin 100 MG/5ML syrup Commonly known as:  ROBITUSSIN Take 200 mg by mouth 4 (four) times daily as needed for cough.   HYDROcodone-acetaminophen 5-325 MG tablet Commonly known as:  NORCO/VICODIN Take 1 tablet by mouth every 6 (six) hours as needed for moderate pain or severe pain.   insulin aspart  100 UNIT/ML injection Commonly known as:  novoLOG Inject into the skin 4 (four) times daily -  before meals and at bedtime. SLIDING SCALE   lamoTRIgine 100 MG tablet Commonly known as:  LAMICTAL Take 100 mg by mouth at bedtime.   LEVEMIR FLEXPEN 100 UNIT/ML Pen Generic drug:  Insulin Detemir Inject 15 Units into the skin at bedtime.   levETIRAcetam 500 MG tablet Commonly known as:  KEPPRA Take 500 mg by mouth daily.   levofloxacin 500 MG tablet Commonly known as:  LEVAQUIN Take 1  tablet (500 mg total) by mouth every other day. Until 04/29/15   midodrine 5 MG tablet Commonly known as:  PROAMATINE Take 1 tablet (5 mg total) by mouth 3 (three) times daily with meals.   omeprazole 20 MG capsule Commonly known as:  PRILOSEC Take 20 mg by mouth daily.   ondansetron 4 MG tablet Commonly known as:  ZOFRAN Take 4 mg by mouth every 8 (eight) hours as needed for nausea or vomiting.   sevelamer carbonate 800 MG tablet Commonly known as:  RENVELA Take 800 mg by mouth 3 (three) times daily with meals.   sitaGLIPtin 25 MG tablet Commonly known as:  JANUVIA Take 25 mg by mouth daily.   Vancomycin 750-5 MG/150ML-% Soln Commonly known as:  VANCOCIN Inject 150 mLs (750 mg total) into the vein every Monday, Wednesday, and Friday with hemodialysis. Until 05/01/16 Start taking on:  04/19/2016   vitamin C 500 MG tablet Commonly known as:  ASCORBIC ACID Take 500 mg by mouth daily.        DISCHARGE INSTRUCTIONS:   1. For dialysis tomorrow per schedule 2. PCP f/u in 1 week  DIET:   Renal diet  ACTIVITY:   Activity as tolerated  OXYGEN:   Home Oxygen: Yes.    Oxygen Delivery: 2 liters/min via Patient connected to nasal cannula oxygen  DISCHARGE LOCATION:   nursing home   If you experience worsening of your admission symptoms, develop shortness of breath, life threatening emergency, suicidal or homicidal thoughts you must seek medical attention immediately by calling 911 or calling your MD immediately  if symptoms less severe.  You Must read complete instructions/literature along with all the possible adverse reactions/side effects for all the Medicines you take and that have been prescribed to you. Take any new Medicines after you have completely understood and accpet all the possible adverse reactions/side effects.   Please note  You were cared for by a hospitalist during your hospital stay. If you have any questions about your discharge medications or the  care you received while you were in the hospital after you are discharged, you can call the unit and asked to speak with the hospitalist on call if the hospitalist that took care of you is not available. Once you are discharged, your primary care physician will handle any further medical issues. Please note that NO REFILLS for any discharge medications will be authorized once you are discharged, as it is imperative that you return to your primary care physician (or establish a relationship with a primary care physician if you do not have one) for your aftercare needs so that they can reassess your need for medications and monitor your lab values.    On the day of Discharge:  VITAL SIGNS:   Blood pressure (!) 100/49, pulse (!) 59, temperature 97.4 F (36.3 C), resp. rate 20, height 5\' 8"  (1.727 m), weight 76.5 kg (168 lb 11.2 oz), SpO2 100 %.  PHYSICAL  EXAMINATION:    GENERAL:  83 y.o.-year-old patient lying in the bed with no acute distress. Hard of hearing EYES: Pupils equal, round, reactive to light and accommodation. No scleral icterus. Extraocular muscles intact.  HEENT: Head atraumatic, normocephalic. Oropharynx and nasopharynx clear.  NECK:  Supple, no jugular venous distention. No thyroid enlargement, no tenderness.  LUNGS: Normal breath sounds bilaterally, no wheezing, rales,rhonchi or crepitation. No use of accessory muscles of respiration. Fine bibasilar crackles. CARDIOVASCULAR: S1, S2 normal. No murmurs, rubs, or gallops.  ABDOMEN: Soft, nontender, nondistended. Bowel sounds present. No organomegaly or mass.  EXTREMITIES: No pedal edema, cyanosis, or clubbing.  NEUROLOGIC: Cranial nerves II through XII are intact. No new motor deficits. Sensation intact. Gait not checked. Global weakness present PSYCHIATRIC: The patient is alert and oriented x 2-3.  SKIN: No obvious rash, lesion, or ulcer  DATA REVIEW:   CBC  Recent Labs Lab 04/16/16 0452  WBC 9.9  HGB 9.6*  HCT 28.7*    PLT 193    Chemistries   Recent Labs Lab 04/12/16 2238 04/13/16 0452  04/17/16 1030  NA 139 136  < > 136  K 4.4 4.6  < > 4.0  CL 98* 99*  < > 97*  CO2 25 25  < > 25  GLUCOSE 161* 240*  < > 241*  BUN 45* 46*  < > 74*  CREATININE 6.97* 6.77*  < > 8.22*  CALCIUM 8.5* 7.8*  < > 7.7*  MG  --  1.7  --   --   AST 66*  --   --   --   ALT 70*  --   --   --   ALKPHOS 142*  --   --   --   BILITOT 1.2  --   --   --   < > = values in this interval not displayed.   Microbiology Results  Results for orders placed or performed during the hospital encounter of 04/12/16  Blood Culture (routine x 2)     Status: Abnormal   Collection Time: 04/12/16  9:27 PM  Result Value Ref Range Status   Specimen Description BLOOD RIGHT WRIST  Final   Special Requests BOTTLES DRAWN AEROBIC AND ANAEROBIC 4CCAERO,4CCANA  Final   Culture  Setup Time   Final    GRAM POSITIVE COCCI IN BOTH AEROBIC AND ANAEROBIC BOTTLES CRITICAL RESULT CALLED TO, READ BACK BY AND VERIFIED WITH: SHEEMA HALLAJI ON 04/13/16 AT 1614 [facility]    Culture (A)  Final    ENTEROCOCCUS FAECIUM STAPHYLOCOCCUS SPECIES (COAGULASE NEGATIVE) SUSCEPTIBILITIES PERFORMED ON PREVIOUS CULTURE WITHIN THE LAST 5 DAYS. Performed at [facility]    Report Status 04/16/2016 FINAL  Final   Organism ID, Bacteria ENTEROCOCCUS FAECIUM  Final      Susceptibility   Enterococcus faecium - MIC*    AMPICILLIN >=32 RESISTANT Resistant     VANCOMYCIN <=0.5 SENSITIVE Sensitive     GENTAMICIN SYNERGY SENSITIVE Sensitive     * ENTEROCOCCUS FAECIUM  Blood Culture ID Panel (Reflexed)     Status: Abnormal   Collection Time: 04/12/16  9:27 PM  Result Value Ref Range Status   Enterococcus species DETECTED (A) NOT DETECTED Final    Comment: CRITICAL RESULT CALLED TO, READ BACK BY AND VERIFIED WITH: SHEEMA HALLAJI ON 04/13/16 AT 1614 [place]    Vancomycin resistance NOT DETECTED NOT DETECTED Final   Listeria monocytogenes NOT DETECTED NOT DETECTED Final    Staphylococcus species NOT DETECTED NOT DETECTED Final  Staphylococcus aureus NOT DETECTED NOT DETECTED Final   Streptococcus species NOT DETECTED NOT DETECTED Final   Streptococcus agalactiae NOT DETECTED NOT DETECTED Final   Streptococcus pneumoniae NOT DETECTED NOT DETECTED Final   Streptococcus pyogenes NOT DETECTED NOT DETECTED Final   Acinetobacter baumannii NOT DETECTED NOT DETECTED Final   Enterobacteriaceae species NOT DETECTED NOT DETECTED Final   Enterobacter cloacae complex NOT DETECTED NOT DETECTED Final   Escherichia coli NOT DETECTED NOT DETECTED Final   Klebsiella oxytoca NOT DETECTED NOT DETECTED Final   Klebsiella pneumoniae NOT DETECTED NOT DETECTED Final   Proteus species NOT DETECTED NOT DETECTED Final   Serratia marcescens NOT DETECTED NOT DETECTED Final   Haemophilus influenzae NOT DETECTED NOT DETECTED Final   Neisseria meningitidis NOT DETECTED NOT DETECTED Final   Pseudomonas aeruginosa NOT DETECTED NOT DETECTED Final   Candida albicans NOT DETECTED NOT DETECTED Final   Candida glabrata NOT DETECTED NOT DETECTED Final   Candida krusei NOT DETECTED NOT DETECTED Final   Candida parapsilosis NOT DETECTED NOT DETECTED Final   Candida tropicalis NOT DETECTED NOT DETECTED Final  Blood Culture (routine x 2)     Status: Abnormal   Collection Time: 04/12/16 10:37 PM  Result Value Ref Range Status   Specimen Description BLOOD CENTRAL LINE  Final   Special Requests BOTTLES DRAWN AEROBIC AND ANAEROBIC 6CCAERO,3CCANA  Final   Culture  Setup Time   Final    GRAM POSITIVE COCCI ANAEROBIC BOTTLE ONLY CRITICAL VALUE NOTED.  VALUE IS CONSISTENT WITH PREVIOUSLY REPORTED AND CALLED VALUE. GRAM POSITIVE RODS AEROBIC BOTTLE ONLY CRITICAL RESULT CALLED TO, READ BACK BY AND VERIFIED WITH: Sheema Hallaji @ [id number] 04/14/16 by [facility]    Culture (A)  Final    STAPHYLOCOCCUS SPECIES (COAGULASE NEGATIVE) DIPHTHEROIDS(CORYNEBACTERIUM SPECIES) Standardized susceptibility testing for this  organism is not available. Performed at [facility]    Report Status 04/16/2016 FINAL  Final   Organism ID, Bacteria STAPHYLOCOCCUS SPECIES (COAGULASE NEGATIVE)  Final      Susceptibility   Staphylococcus species (coagulase negative) - MIC*    CIPROFLOXACIN >=8 RESISTANT Resistant     ERYTHROMYCIN >=8 RESISTANT Resistant     GENTAMICIN <=0.5 SENSITIVE Sensitive     OXACILLIN >=4 RESISTANT Resistant     TETRACYCLINE >=16 RESISTANT Resistant     VANCOMYCIN 2 SENSITIVE Sensitive     TRIMETH/SULFA 80 RESISTANT Resistant     CLINDAMYCIN <=0.25 SENSITIVE Sensitive     RIFAMPIN <=0.5 SENSITIVE Sensitive     Inducible Clindamycin NEGATIVE Sensitive     * STAPHYLOCOCCUS SPECIES (COAGULASE NEGATIVE)  Blood Culture ID Panel (Reflexed)     Status: None   Collection Time: 04/12/16 10:37 PM  Result Value Ref Range Status   Enterococcus species NOT DETECTED NOT DETECTED Final   Listeria monocytogenes NOT DETECTED NOT DETECTED Final   Staphylococcus species NOT DETECTED NOT DETECTED Final   Staphylococcus aureus NOT DETECTED NOT DETECTED Final   Streptococcus species NOT DETECTED NOT DETECTED Final   Streptococcus agalactiae NOT DETECTED NOT DETECTED Final   Streptococcus pneumoniae NOT DETECTED NOT DETECTED Final   Streptococcus pyogenes NOT DETECTED NOT DETECTED Final   Acinetobacter baumannii NOT DETECTED NOT DETECTED Final   Enterobacteriaceae species NOT DETECTED NOT DETECTED Final   Enterobacter cloacae complex NOT DETECTED NOT DETECTED Final   Escherichia coli NOT DETECTED NOT DETECTED Final   Klebsiella oxytoca NOT DETECTED NOT DETECTED Final   Klebsiella pneumoniae NOT DETECTED NOT DETECTED Final   Proteus species NOT DETECTED NOT  DETECTED Final   Serratia marcescens NOT DETECTED NOT DETECTED Final   Haemophilus influenzae NOT DETECTED NOT DETECTED Final   Neisseria meningitidis NOT DETECTED NOT DETECTED Final   Pseudomonas aeruginosa NOT DETECTED NOT DETECTED Final   Candida  albicans NOT DETECTED NOT DETECTED Final   Candida glabrata NOT DETECTED NOT DETECTED Final   Candida krusei NOT DETECTED NOT DETECTED Final   Candida parapsilosis NOT DETECTED NOT DETECTED Final   Candida tropicalis NOT DETECTED NOT DETECTED Final  MRSA PCR Screening     Status: None   Collection Time: 04/13/16  2:24 AM  Result Value Ref Range Status   MRSA by PCR NEGATIVE NEGATIVE Final    Comment:        The GeneXpert MRSA Assay (FDA approved for NASAL specimens only), is one component of a comprehensive MRSA colonization surveillance program. It is not intended to diagnose MRSA infection nor to guide or monitor treatment for MRSA infections.   Culture, blood (single) w Reflex to ID Panel     Status: None (Preliminary result)   Collection Time: 04/14/16  6:06 PM  Result Value Ref Range Status   Specimen Description BLOOD RIGHT ARM  Final   Special Requests   Final    BOTTLES DRAWN AEROBIC AND ANAEROBIC ANA 6CC AER [place]   Culture NO GROWTH 4 DAYS  Final   Report Status PENDING  Incomplete    RADIOLOGY:  No results found.   Management plans discussed with the patient, family and they are in agreement.  CODE STATUS:     Code Status Orders        Start     Ordered   04/13/16 0021  Full code  Continuous     04/13/16 0021    Code Status History    Date Active Date Inactive Code Status Order ID Comments User Context   07/11/2013  3:42 PM 08/02/2013  6:20 PM Full Code [id number]  Genella Rife Inpatient    Advance Directive Documentation   Flowsheet Row Most Recent Value  Type of Advance Directive  Healthcare Power of Attorney, Living will  Pre-existing out of facility DNR order (yellow form or pink MOST form)  No data  "MOST" Form in Place?  No data      TOTAL TIME TAKING CARE OF THIS PATIENT: 38 minutes.    Gladstone Lighter M.D on 04/18/2016 at 12:06 PM  Between 7am to 6pm - Pager - [phone]  After 6pm go to [email or web address] - [employer]  [facility] [place] [facility]  Office  [phone]  CC: Primary care physician; Marden Noble, MD   Note: This dictation was prepared with Dragon dictation along with smaller phrase technology. Any transcriptional errors that result from this process are unintentional.

## 2016-04-18 NOTE — Progress Notes (Signed)
[facility] at [phone] and gave report to [facility] LPN on patient.

## 2016-04-19 LAB — CULTURE, BLOOD (SINGLE): Culture: NO GROWTH

## 2016-09-24 ENCOUNTER — Encounter: Payer: Self-pay | Admitting: Internal Medicine

## 2016-09-24 ENCOUNTER — Inpatient Hospital Stay
Admission: EM | Admit: 2016-09-24 | Discharge: 2016-09-29 | DRG: 871 | Disposition: A | Discharge status: Skilled Nursing Facility | Payer: Medicare Other | Attending: Specialist | Admitting: Specialist

## 2016-09-24 ENCOUNTER — Inpatient Hospital Stay: Payer: Medicare Other

## 2016-09-24 ENCOUNTER — Emergency Department: Payer: Medicare Other

## 2016-09-24 DIAGNOSIS — R7881 Bacteremia: Secondary | ICD-10-CM | POA: Diagnosis not present

## 2016-09-24 DIAGNOSIS — Z794 Long term (current) use of insulin: Secondary | ICD-10-CM | POA: Diagnosis not present

## 2016-09-24 DIAGNOSIS — E1122 Type 2 diabetes mellitus with diabetic chronic kidney disease: Secondary | ICD-10-CM | POA: Diagnosis present

## 2016-09-24 DIAGNOSIS — F0150 Vascular dementia without behavioral disturbance: Secondary | ICD-10-CM | POA: Diagnosis present

## 2016-09-24 DIAGNOSIS — I248 Other forms of acute ischemic heart disease: Secondary | ICD-10-CM | POA: Diagnosis present

## 2016-09-24 DIAGNOSIS — F39 Unspecified mood [affective] disorder: Secondary | ICD-10-CM | POA: Diagnosis present

## 2016-09-24 DIAGNOSIS — A419 Sepsis, unspecified organism: Secondary | ICD-10-CM | POA: Diagnosis present

## 2016-09-24 DIAGNOSIS — Z89429 Acquired absence of other toe(s), unspecified side: Secondary | ICD-10-CM

## 2016-09-24 DIAGNOSIS — B179 Acute viral hepatitis, unspecified: Secondary | ICD-10-CM | POA: Diagnosis present

## 2016-09-24 DIAGNOSIS — R6521 Severe sepsis with septic shock: Secondary | ICD-10-CM | POA: Diagnosis present

## 2016-09-24 DIAGNOSIS — G9341 Metabolic encephalopathy: Secondary | ICD-10-CM | POA: Diagnosis present

## 2016-09-24 DIAGNOSIS — Z515 Encounter for palliative care: Secondary | ICD-10-CM | POA: Diagnosis present

## 2016-09-24 DIAGNOSIS — K219 Gastro-esophageal reflux disease without esophagitis: Secondary | ICD-10-CM | POA: Diagnosis present

## 2016-09-24 DIAGNOSIS — E119 Type 2 diabetes mellitus without complications: Secondary | ICD-10-CM | POA: Diagnosis not present

## 2016-09-24 DIAGNOSIS — K819 Cholecystitis, unspecified: Secondary | ICD-10-CM | POA: Diagnosis not present

## 2016-09-24 DIAGNOSIS — G8929 Other chronic pain: Secondary | ICD-10-CM | POA: Diagnosis present

## 2016-09-24 DIAGNOSIS — N186 End stage renal disease: Secondary | ICD-10-CM

## 2016-09-24 DIAGNOSIS — J9601 Acute respiratory failure with hypoxia: Secondary | ICD-10-CM | POA: Diagnosis present

## 2016-09-24 DIAGNOSIS — Z992 Dependence on renal dialysis: Secondary | ICD-10-CM | POA: Diagnosis not present

## 2016-09-24 DIAGNOSIS — A4151 Sepsis due to Escherichia coli [E. coli]: Principal | ICD-10-CM | POA: Diagnosis present

## 2016-09-24 DIAGNOSIS — J9811 Atelectasis: Secondary | ICD-10-CM | POA: Diagnosis present

## 2016-09-24 DIAGNOSIS — I214 Non-ST elevation (NSTEMI) myocardial infarction: Secondary | ICD-10-CM | POA: Diagnosis present

## 2016-09-24 DIAGNOSIS — Z8546 Personal history of malignant neoplasm of prostate: Secondary | ICD-10-CM

## 2016-09-24 DIAGNOSIS — I739 Peripheral vascular disease, unspecified: Secondary | ICD-10-CM | POA: Diagnosis not present

## 2016-09-24 DIAGNOSIS — R7989 Other specified abnormal findings of blood chemistry: Secondary | ICD-10-CM | POA: Diagnosis not present

## 2016-09-24 DIAGNOSIS — E785 Hyperlipidemia, unspecified: Secondary | ICD-10-CM | POA: Diagnosis present

## 2016-09-24 DIAGNOSIS — R296 Repeated falls: Secondary | ICD-10-CM | POA: Diagnosis present

## 2016-09-24 DIAGNOSIS — J189 Pneumonia, unspecified organism: Secondary | ICD-10-CM | POA: Diagnosis present

## 2016-09-24 DIAGNOSIS — D631 Anemia in chronic kidney disease: Secondary | ICD-10-CM | POA: Diagnosis present

## 2016-09-24 DIAGNOSIS — R509 Fever, unspecified: Secondary | ICD-10-CM | POA: Diagnosis not present

## 2016-09-24 DIAGNOSIS — K8000 Calculus of gallbladder with acute cholecystitis without obstruction: Secondary | ICD-10-CM | POA: Diagnosis present

## 2016-09-24 DIAGNOSIS — IMO0001 Reserved for inherently not codable concepts without codable children: Secondary | ICD-10-CM

## 2016-09-24 DIAGNOSIS — E1142 Type 2 diabetes mellitus with diabetic polyneuropathy: Secondary | ICD-10-CM | POA: Diagnosis present

## 2016-09-24 DIAGNOSIS — D649 Anemia, unspecified: Secondary | ICD-10-CM | POA: Diagnosis present

## 2016-09-24 DIAGNOSIS — K859 Acute pancreatitis without necrosis or infection, unspecified: Secondary | ICD-10-CM | POA: Diagnosis not present

## 2016-09-24 DIAGNOSIS — Y95 Nosocomial condition: Secondary | ICD-10-CM | POA: Diagnosis present

## 2016-09-24 DIAGNOSIS — F0390 Unspecified dementia without behavioral disturbance: Secondary | ICD-10-CM | POA: Diagnosis not present

## 2016-09-24 DIAGNOSIS — K851 Biliary acute pancreatitis without necrosis or infection: Secondary | ICD-10-CM | POA: Diagnosis present

## 2016-09-24 DIAGNOSIS — G40909 Epilepsy, unspecified, not intractable, without status epilepticus: Secondary | ICD-10-CM | POA: Diagnosis present

## 2016-09-24 DIAGNOSIS — Z66 Do not resuscitate: Secondary | ICD-10-CM | POA: Diagnosis present

## 2016-09-24 DIAGNOSIS — Z8673 Personal history of transient ischemic attack (TIA), and cerebral infarction without residual deficits: Secondary | ICD-10-CM

## 2016-09-24 DIAGNOSIS — I959 Hypotension, unspecified: Secondary | ICD-10-CM | POA: Diagnosis not present

## 2016-09-24 DIAGNOSIS — Z880 Allergy status to penicillin: Secondary | ICD-10-CM

## 2016-09-24 DIAGNOSIS — N2581 Secondary hyperparathyroidism of renal origin: Secondary | ICD-10-CM | POA: Diagnosis present

## 2016-09-24 DIAGNOSIS — R748 Abnormal levels of other serum enzymes: Secondary | ICD-10-CM | POA: Diagnosis not present

## 2016-09-24 DIAGNOSIS — Z886 Allergy status to analgesic agent status: Secondary | ICD-10-CM

## 2016-09-24 DIAGNOSIS — K759 Inflammatory liver disease, unspecified: Secondary | ICD-10-CM

## 2016-09-24 DIAGNOSIS — I120 Hypertensive chronic kidney disease with stage 5 chronic kidney disease or end stage renal disease: Secondary | ICD-10-CM | POA: Diagnosis present

## 2016-09-24 DIAGNOSIS — E1151 Type 2 diabetes mellitus with diabetic peripheral angiopathy without gangrene: Secondary | ICD-10-CM | POA: Diagnosis present

## 2016-09-24 DIAGNOSIS — Z79899 Other long term (current) drug therapy: Secondary | ICD-10-CM

## 2016-09-24 DIAGNOSIS — R531 Weakness: Secondary | ICD-10-CM | POA: Diagnosis not present

## 2016-09-24 DIAGNOSIS — F329 Major depressive disorder, single episode, unspecified: Secondary | ICD-10-CM | POA: Diagnosis present

## 2016-09-24 DIAGNOSIS — Z7189 Other specified counseling: Secondary | ICD-10-CM

## 2016-09-24 DIAGNOSIS — Z96642 Presence of left artificial hip joint: Secondary | ICD-10-CM | POA: Diagnosis present

## 2016-09-24 HISTORY — DX: Dependence on renal dialysis: N18.6

## 2016-09-24 HISTORY — DX: Chronic pain syndrome: G89.4

## 2016-09-24 HISTORY — DX: Long term (current) use of insulin: Z79.4

## 2016-09-24 HISTORY — DX: Polyneuropathy, unspecified: G62.9

## 2016-09-24 HISTORY — DX: Dependence on renal dialysis: Z99.2

## 2016-09-24 HISTORY — DX: Type 2 diabetes mellitus with unspecified complications: E11.8

## 2016-09-24 HISTORY — DX: Reserved for inherently not codable concepts without codable children: IMO0001

## 2016-09-24 HISTORY — DX: Anemia in other chronic diseases classified elsewhere: D63.8

## 2016-09-24 LAB — CBC WITH DIFFERENTIAL/PLATELET
BLASTS: 0 %
Band Neutrophils: 0 %
Basophils Absolute: 0 10*3/uL (ref 0–0.1)
Basophils Relative: 0 %
Eosinophils Absolute: 0 10*3/uL (ref 0–0.7)
Eosinophils Relative: 0 %
HEMATOCRIT: 33.8 % — AB (ref 40.0–52.0)
Hemoglobin: 11 g/dL — ABNORMAL LOW (ref 13.0–18.0)
LYMPHS PCT: 2 %
Lymphs Abs: 0.4 10*3/uL — ABNORMAL LOW (ref 1.0–3.6)
MCH: 30.3 pg (ref 26.0–34.0)
MCHC: 32.6 g/dL (ref 32.0–36.0)
MCV: 93.1 fL (ref 80.0–100.0)
Metamyelocytes Relative: 0 %
Monocytes Absolute: 0.8 10*3/uL (ref 0.2–1.0)
Monocytes Relative: 4 %
Myelocytes: 0 %
NEUTROS PCT: 94 %
NRBC: 0 /100{WBCs}
Neutro Abs: 19.9 10*3/uL — ABNORMAL HIGH (ref 1.4–6.5)
OTHER: 0 %
PROMYELOCYTES ABS: 0 %
Platelets: 196 10*3/uL (ref 150–440)
RBC: 3.64 MIL/uL — AB (ref 4.40–5.90)
RDW: 16 % — AB (ref 11.5–14.5)
WBC: 21.1 10*3/uL — ABNORMAL HIGH (ref 3.8–10.6)

## 2016-09-24 LAB — COMPREHENSIVE METABOLIC PANEL
ALK PHOS: 258 U/L — AB (ref 38–126)
ALT: 198 U/L — ABNORMAL HIGH (ref 17–63)
ANION GAP: 16 — AB (ref 5–15)
AST: 243 U/L — ABNORMAL HIGH (ref 15–41)
Albumin: 3.7 g/dL (ref 3.5–5.0)
BUN: 48 mg/dL — ABNORMAL HIGH (ref 6–20)
CALCIUM: 9.4 mg/dL (ref 8.9–10.3)
CO2: 31 mmol/L (ref 22–32)
Chloride: 95 mmol/L — ABNORMAL LOW (ref 101–111)
Creatinine, Ser: 5.73 mg/dL — ABNORMAL HIGH (ref 0.61–1.24)
GFR calc non Af Amer: 8 mL/min — ABNORMAL LOW (ref >60)
GFR, EST AFRICAN AMERICAN: 9 mL/min — AB (ref >60)
Glucose, Bld: 206 mg/dL — ABNORMAL HIGH (ref 65–99)
Potassium: 4.2 mmol/L (ref 3.5–5.1)
SODIUM: 142 mmol/L (ref 135–145)
TOTAL PROTEIN: 8.3 g/dL — AB (ref 6.5–8.1)
Total Bilirubin: 2.5 mg/dL — ABNORMAL HIGH (ref 0.3–1.2)

## 2016-09-24 LAB — LACTIC ACID, PLASMA
LACTIC ACID, VENOUS: 2.1 mmol/L — AB (ref 0.5–1.9)
Lactic Acid, Venous: 3.2 mmol/L (ref 0.5–1.9)
Lactic Acid, Venous: 1.7 mmol/L (ref 0.5–1.9)

## 2016-09-24 LAB — BLOOD GAS, ARTERIAL
ACID-BASE EXCESS: 6.4 mmol/L — AB (ref 0.0–2.0)
BICARBONATE: 30.5 mmol/L — AB (ref 20.0–28.0)
FIO2: 1
O2 SAT: 96.7 %
PATIENT TEMPERATURE: 37
PO2 ART: 81 mmHg — AB (ref 83.0–108.0)
pCO2 arterial: 41 mmHg (ref 32.0–48.0)
pH, Arterial: 7.48 — ABNORMAL HIGH (ref 7.350–7.450)

## 2016-09-24 LAB — MRSA PCR SCREENING: MRSA BY PCR: NEGATIVE

## 2016-09-24 LAB — TROPONIN I
TROPONIN I: 0.18 ng/mL — AB (ref <0.03)
Troponin I: 0.07 ng/mL (ref <0.03)
Troponin I: 0.43 ng/mL (ref <0.03)

## 2016-09-24 LAB — GLUCOSE, CAPILLARY
GLUCOSE-CAPILLARY: 219 mg/dL — AB (ref 65–99)
GLUCOSE-CAPILLARY: 189 mg/dL — AB (ref 65–99)
Glucose-Capillary: 207 mg/dL — ABNORMAL HIGH (ref 65–99)
Glucose-Capillary: 206 mg/dL — ABNORMAL HIGH (ref 65–99)

## 2016-09-24 LAB — LIPASE, BLOOD: Lipase: 2491 U/L — ABNORMAL HIGH (ref 11–51)

## 2016-09-24 MED ORDER — SODIUM CHLORIDE 0.9 % IV SOLN
500.0000 mg | INTRAVENOUS | Status: DC
  Filled 2016-09-24 (×2): qty 0.5

## 2016-09-24 MED ORDER — ACETAMINOPHEN 325 MG PO TABS: 650.0000 mg | ORAL_TABLET | Freq: Four times a day (QID) | ORAL | Status: DC | PRN

## 2016-09-24 MED ORDER — ONDANSETRON HCL 4 MG/2ML IJ SOLN: 4.0000 mg | Freq: Four times a day (QID) | INTRAMUSCULAR | Status: DC | PRN

## 2016-09-24 MED ORDER — INSULIN ASPART 100 UNIT/ML ~~LOC~~ SOLN
0.0000 [IU] | Freq: Every day | SUBCUTANEOUS | Status: DC
  Administered 2016-09-25 – 2016-09-27 (×2): 2 [IU] via SUBCUTANEOUS
  Administered 2016-09-28: 3 [IU] via SUBCUTANEOUS
  Filled 2016-09-24 (×3): qty 1

## 2016-09-24 MED ORDER — HEPARIN SODIUM (PORCINE) 5000 UNIT/ML IJ SOLN: 5000.0000 [IU] | Freq: Three times a day (TID) | INTRAMUSCULAR | Status: DC

## 2016-09-24 MED ORDER — ACETAMINOPHEN 650 MG RE SUPP
650.0000 mg | Freq: Once | RECTAL | Status: AC
Start: 2016-09-24 — End: 2016-09-24
  Administered 2016-09-24: 650 mg via RECTAL

## 2016-09-24 MED ORDER — ACETAMINOPHEN 650 MG RE SUPP: 975.0000 mg | Freq: Once | RECTAL | Status: DC

## 2016-09-24 MED ORDER — VANCOMYCIN HCL IN DEXTROSE 750-5 MG/150ML-% IV SOLN
750.0000 mg | INTRAVENOUS | Status: DC | PRN
  Filled 2016-09-24: qty 150

## 2016-09-24 MED ORDER — MIDODRINE HCL 5 MG PO TABS
5.0000 mg | ORAL_TABLET | Freq: Three times a day (TID) | ORAL | Status: DC
  Administered 2016-09-24 – 2016-09-29 (×13): 5 mg via ORAL
  Filled 2016-09-24 (×13): qty 1

## 2016-09-24 MED ORDER — VANCOMYCIN HCL 10 G IV SOLR
1750.0000 mg | Freq: Once | INTRAVENOUS | Status: AC
  Administered 2016-09-24: 1750 mg via INTRAVENOUS
  Filled 2016-09-24 (×2): qty 1750

## 2016-09-24 MED ORDER — VANCOMYCIN HCL IN DEXTROSE 1-5 GM/200ML-% IV SOLN: 1000.0000 mg | Freq: Once | INTRAVENOUS | Status: DC

## 2016-09-24 MED ORDER — ACETAMINOPHEN 650 MG RE SUPP
RECTAL | Status: AC
  Administered 2016-09-24: 650 mg via RECTAL
  Filled 2016-09-24: qty 1

## 2016-09-24 MED ORDER — SODIUM CHLORIDE 0.9 % IV BOLUS (SEPSIS)
500.0000 mL | Freq: Once | INTRAVENOUS | Status: AC
  Administered 2016-09-24: 500 mL via INTRAVENOUS

## 2016-09-24 MED ORDER — HEPARIN SODIUM (PORCINE) 5000 UNIT/ML IJ SOLN
5000.0000 [IU] | Freq: Three times a day (TID) | INTRAMUSCULAR | Status: DC
  Administered 2016-09-24 – 2016-09-25 (×3): 5000 [IU] via SUBCUTANEOUS
  Filled 2016-09-24 (×3): qty 1

## 2016-09-24 MED ORDER — DOPAMINE-DEXTROSE 3.2-5 MG/ML-% IV SOLN
0.0000 ug/kg/min | INTRAVENOUS | Status: DC
  Administered 2016-09-24: 5 ug/kg/min via INTRAVENOUS
  Administered 2016-09-24: 9 ug/kg/min via INTRAVENOUS

## 2016-09-24 MED ORDER — ATORVASTATIN CALCIUM 20 MG PO TABS
20.0000 mg | ORAL_TABLET | Freq: Every day | ORAL | Status: DC
  Administered 2016-09-25 – 2016-09-28 (×4): 20 mg via ORAL
  Filled 2016-09-24 (×4): qty 1

## 2016-09-24 MED ORDER — LAMOTRIGINE 100 MG PO TABS
100.0000 mg | ORAL_TABLET | Freq: Every day | ORAL | Status: DC
  Administered 2016-09-24 – 2016-09-28 (×5): 100 mg via ORAL
  Filled 2016-09-24 (×5): qty 1

## 2016-09-24 MED ORDER — SODIUM CHLORIDE 0.9 % IV BOLUS (SEPSIS)
1000.0000 mL | Freq: Once | INTRAVENOUS | Status: AC
Start: 2016-09-24 — End: 2016-09-24
  Administered 2016-09-24: 1000 mL via INTRAVENOUS

## 2016-09-24 MED ORDER — SODIUM CHLORIDE 0.9 % IV SOLN
INTRAVENOUS | Status: DC
  Administered 2016-09-24 – 2016-09-25 (×2): via INTRAVENOUS

## 2016-09-24 MED ORDER — DOPAMINE-DEXTROSE 3.2-5 MG/ML-% IV SOLN
INTRAVENOUS | Status: AC
  Administered 2016-09-24: 9 ug/kg/min via INTRAVENOUS
  Filled 2016-09-24: qty 250

## 2016-09-24 MED ORDER — SENNOSIDES-DOCUSATE SODIUM 8.6-50 MG PO TABS
1.0000 | ORAL_TABLET | Freq: Every evening | ORAL | Status: DC | PRN
  Administered 2016-09-28: 1 via ORAL
  Filled 2016-09-24: qty 1

## 2016-09-24 MED ORDER — SEVELAMER CARBONATE 800 MG PO TABS
800.0000 mg | ORAL_TABLET | Freq: Three times a day (TID) | ORAL | Status: DC
  Administered 2016-09-24: 800 mg via ORAL
  Filled 2016-09-24 (×2): qty 1

## 2016-09-24 MED ORDER — PANTOPRAZOLE SODIUM 40 MG PO TBEC: 40.0000 mg | DELAYED_RELEASE_TABLET | Freq: Every day | ORAL | Status: DC

## 2016-09-24 MED ORDER — DEXTROSE 5 % IV SOLN
2.0000 g | Freq: Once | INTRAVENOUS | Status: AC
  Administered 2016-09-24: 2 g via INTRAVENOUS
  Filled 2016-09-24: qty 2

## 2016-09-24 MED ORDER — ONDANSETRON HCL 4 MG PO TABS: 4.0000 mg | ORAL_TABLET | Freq: Four times a day (QID) | ORAL | Status: DC | PRN

## 2016-09-24 MED ORDER — LEVETIRACETAM 500 MG PO TABS
500.0000 mg | ORAL_TABLET | Freq: Every day | ORAL | Status: DC
  Administered 2016-09-24: 500 mg via ORAL
  Filled 2016-09-24: qty 1

## 2016-09-24 MED ORDER — DOPAMINE-DEXTROSE 3.2-5 MG/ML-% IV SOLN: 0.0000 ug/kg/min | INTRAVENOUS | Status: DC

## 2016-09-24 MED ORDER — ACETAMINOPHEN 650 MG RE SUPP: 650.0000 mg | Freq: Four times a day (QID) | RECTAL | Status: DC | PRN

## 2016-09-24 MED ORDER — IPRATROPIUM-ALBUTEROL 0.5-2.5 (3) MG/3ML IN SOLN
3.0000 mL | Freq: Four times a day (QID) | RESPIRATORY_TRACT | Status: DC
  Administered 2016-09-24 – 2016-09-27 (×12): 3 mL via RESPIRATORY_TRACT
  Filled 2016-09-24 (×13): qty 3

## 2016-09-24 MED ORDER — SODIUM CHLORIDE 0.9 % IV BOLUS (SEPSIS)
1000.0000 mL | Freq: Once | INTRAVENOUS | Status: AC
  Administered 2016-09-24: 1000 mL via INTRAVENOUS

## 2016-09-24 MED ORDER — INSULIN ASPART 100 UNIT/ML ~~LOC~~ SOLN
0.0000 [IU] | Freq: Three times a day (TID) | SUBCUTANEOUS | Status: DC
  Administered 2016-09-24: 3 [IU] via SUBCUTANEOUS
  Administered 2016-09-25 – 2016-09-27 (×7): 2 [IU] via SUBCUTANEOUS
  Administered 2016-09-28: 3 [IU] via SUBCUTANEOUS
  Administered 2016-09-28: 1 [IU] via SUBCUTANEOUS
  Administered 2016-09-29: 5 [IU] via SUBCUTANEOUS
  Administered 2016-09-29: 1 [IU] via SUBCUTANEOUS
  Filled 2016-09-24 (×2): qty 1
  Filled 2016-09-24: qty 2
  Filled 2016-09-24 (×7): qty 1
  Filled 2016-09-24: qty 3
  Filled 2016-09-24: qty 1

## 2016-09-24 NOTE — ED Notes (Signed)
1003

## 2016-09-24 NOTE — ED Notes (Signed)
Date and time results received: 09/24/16 0601 (use smartphrase ".now" to insert current time)  Test: lactic acid Critical Value: 3.2  Name of Provider Notified: Beather Arbour  Orders Received? Or Actions Taken?:

## 2016-09-24 NOTE — Progress Notes (Signed)
[facility]  ROUNDING NOTE   Subjective:   [name] admitted to [facility] on 09/24/2016 for HCAP (healthcare-associated pneumonia) [J18.9] Sepsis, due to unspecified organism ([place]) [A41.9] Fever, unspecified fever cause [R50.9] Hypotension, unspecified hypotension type [I95.9]   Tmax 101.8  Wife at bedside.  Patient last dialysis was Monday. Lives at [facility]  Objective:  Vital signs in last 24 hours:  Temp:  [99 F (37.2 C)-101.8 F (38.8 C)] 99 F (37.2 C) (06/12 0900) Pulse Rate:  [104-130] 107 (06/12 0800) Resp:  [25-44] 25 (06/12 0900) BP: (58-103)/(20-72) 99/44 (06/12 0900) SpO2:  [100 %] 100 % (06/12 0900) Weight:  [76.2 kg (168 lb)] 76.2 kg (168 lb) (06/12 0449)  Weight change:  Filed Weights   09/24/16 0449  Weight: 76.2 kg (168 lb)    Intake/Output: I/O last 3 completed shifts: In: 500 [IV Piggyback:500] Out: -    Intake/Output this shift:  No intake/output data recorded.  Physical Exam: General: Ill appearing  Head: nonrebreather  Eyes: Anicteric, PERRL  Neck: Supple, trachea midline  Lungs:  Clear to auscultation  Heart: Regular rate and rhythm  Abdomen:  Soft, nontender,   Extremities: no peripheral edema.  Neurologic: Nonfocal, moving all four extremities  Skin: No lesions  Access: Left AVF    Basic Metabolic Panel:  Recent Labs Lab 09/24/16 0638  NA 142  K 4.2  CL 95*  CO2 31  GLUCOSE 206*  BUN 48*  CREATININE 5.73*  CALCIUM 9.4    Liver Function Tests:  Recent Labs Lab 09/24/16 0638  AST 243*  ALT 198*  ALKPHOS 258*  BILITOT 2.5*  PROT 8.3*  ALBUMIN 3.7    Recent Labs Lab 09/24/16 0638  LIPASE 2,491*   No results for input(s): AMMONIA in the last 168 hours.  CBC:  Recent Labs Lab 09/24/16 0638  WBC 21.1*  NEUTROABS 19.9*  HGB 11.0*  HCT 33.8*  MCV 93.1  PLT 196    Cardiac Enzymes:  Recent Labs Lab 09/24/16 0638  TROPONINI 0.07*    BNP: Invalid input(s):  POCBNP  CBG:  Recent Labs Lab 09/24/16 0858  GLUCAP 44*    Microbiology: Results for orders placed or performed during the hospital encounter of 09/24/16  Blood Culture (routine x 2)     Status: None (Preliminary result)   Collection Time: 09/24/16  6:20 AM  Result Value Ref Range Status   Specimen Description BLOOD ARTERIAL DRAW FROM R ARM  Final   Special Requests BOTTLES DRAWN AEROBIC AND ANAEROBIC BCAV  Final   Culture NO GROWTH <12 HOURS  Final   Report Status PENDING  Incomplete  Blood Culture (routine x 2)     Status: None (Preliminary result)   Collection Time: 09/24/16  6:32 AM  Result Value Ref Range Status   Specimen Description BLOOD R HAND  Final   Special Requests   Final    BOTTLES DRAWN AEROBIC AND ANAEROBIC Blood Culture adequate volume   Culture NO GROWTH <12 HOURS  Final   Report Status PENDING  Incomplete    Coagulation Studies: No results for input(s): LABPROT, INR in the last 72 hours.  Urinalysis: No results for input(s): COLORURINE, LABSPEC, PHURINE, GLUCOSEU, HGBUR, BILIRUBINUR, KETONESUR, PROTEINUR, UROBILINOGEN, NITRITE, LEUKOCYTESUR in the last 72 hours.  Invalid input(s): APPERANCEUR    Imaging: Dg Chest Port 1 View  Result Date: 09/24/2016 CLINICAL DATA:  Vomiting, fever. History of end-stage renal disease on dialysis. EXAM: PORTABLE CHEST 1 VIEW COMPARISON:  Chest radiograph April 12, 2016 FINDINGS: Elevated LEFT hemidiaphragm with rounded lucency. Mild bronchitic changes and pulmonary vascular congestion. No pleural effusion. Cardiac silhouette is mildly enlarged and unchanged. Calcified aortic knob. Soft tissue planes and included osseous structures are nonsuspicious. IMPRESSION: LEFT lung base atelectasis, less likely cavitating infiltrate. Recommend follow-up PA and lateral views of the chest when clinically able. Mild cardiomegaly and pulmonary vascular congestion. Similar bronchitic changes. Electronically Signed   By: Elon Alas M.D.   On: 09/24/2016 05:38     Medications:   . DOPamine 5 mcg/kg/min (09/24/16 0736)  . vancomycin    . vancomycin     . acetaminophen  975 mg Rectal Once  . atorvastatin  20 mg Oral q1800  . lamoTRIgine  100 mg Oral QHS  . levETIRAcetam  500 mg Oral Daily  . midodrine  5 mg Oral TID WC  . [START ON 09/25/2016] pantoprazole  40 mg Oral Daily  . sevelamer carbonate  800 mg Oral TID WC   vancomycin  Assessment/ Plan:  [name] is a 83 y.o. black male with end-stage renal disease on hemodialysis, hypertension, diabetes type 2, insulin-dependent, seizure disorder, peripheral neuropathy, vascular dementia  MWF [facility] [place] Left arm AVF  1. End Stage Renal Disease: MWF. Last dialysis yesterday.  Continue dialysis as scheduled, next treatment for tomorrow  2. Sepsis with fever and pneumonia and CXR - aztreonam and vanco empiric  3. Anemia of chronic kidney disease: hemoglobin 11 Mircera as outpatient.  .   4. Secondary Hyperparathyroidism:   - sevelamer    LOS: 0 Daijanae Rafalski 6/12/20189:38 AM

## 2016-09-24 NOTE — Progress Notes (Signed)
Inpatient Diabetes Program Recommendations  AACE/ADA: New Consensus Statement on Inpatient Glycemic Control (2015)  Target Ranges:  Prepandial:   less than 140 mg/dL      Peak postprandial:   less than 180 mg/dL (1-2 hours)      Critically ill patients:  140 - 180 mg/dL   Results for Michael Acosta, Michael Acosta (MRN [id number]) as of 09/24/2016 09:18  Ref. Range 09/24/2016 08:58  Glucose-Capillary Latest Ref Range: 65 - 99 mg/dL 207 (H)  Results for Michael Acosta, Michael Acosta (MRN [id number]) as of 09/24/2016 09:18  Ref. Range 09/24/2016 06:38  Glucose Latest Ref Range: 65 - 99 mg/dL 206 (H)   Review of Glycemic Control  Diabetes history: DM2 Outpatient Diabetes medications: Levemir 15 units QHS, Novolog 0-10 units QID, Tradjenta 5 mg QAM Current orders for Inpatient glycemic control: None  Inpatient Diabetes Program Recommendations: Insulin - Basal: If glucose is consistently greater than 180 mg/dl after Novolog correction started, MD may want to consider ordering Levemir 7 units Q24H. Correction (SSI): Please consider ordering CBGs with Novolog correction scale Q4H.  Thanks, Barnie Alderman, RN, MSN, CDE Diabetes Coordinator Inpatient Diabetes Program [phone] (Team Pager from 8am to 5pm)

## 2016-09-24 NOTE — ED Notes (Signed)
Unsuccessful IV x 2 attempt by this RN and unsuccessful IV attempt by Lattie Haw, RN. Beather Arbour, MD notified. Lab tech, [place], notified of need for blood from patient. Lab tech reports will send lab tech to draw blood when they are available.

## 2016-09-24 NOTE — ED Notes (Signed)
Fluids and IV antibiotics restarted

## 2016-09-24 NOTE — Progress Notes (Signed)
[facility] [place] NAME: Michael Acosta    MR#:  [id number]  DATE OF BIRTH:  01/21/1930  SUBJECTIVE:    REVIEW OF SYSTEMS:   ROS Tolerating Diet: Tolerating PT:   DRUG ALLERGIES:   Allergies  Allergen Reactions  . Aspirin   . Bc Powder [Aspirin-Salicylamide-Caffeine]   . Penicillins Other (See Comments)    Has patient had a PCN reaction causing immediate rash, facial/tongue/throat swelling, SOB or lightheadedness with hypotension: Unknown Has patient had a PCN reaction causing severe rash involving mucus membranes or skin necrosis: Unknown Has patient had a PCN reaction that required hospitalization: Unknown Has patient had a PCN reaction occurring within the last 10 years: Unknown If all of the above answers are "NO", then may proceed with Cephalosporin use.  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    VITALS:  Blood pressure (!) 90/40, pulse (!) 108, temperature 98 F (36.7 C), resp. rate (!) 31, height 5\' 6"  (1.676 m), weight 76.2 kg (168 lb), SpO2 (!) 87 %.  PHYSICAL EXAMINATION:   Physical Exam  GENERAL:  83 y.o.-year-old patient lying in the bed with no acute distress.  EYES: Pupils equal, round, reactive to light and accommodation. No scleral icterus. Extraocular muscles intact.  HEENT: Head atraumatic, normocephalic. Oropharynx and nasopharynx clear.  NECK:  Supple, no jugular venous distention. No thyroid enlargement, no tenderness.  LUNGS: Normal breath sounds bilaterally, no wheezing, rales, rhonchi. No use of accessory muscles of respiration.  CARDIOVASCULAR: S1, S2 normal. No murmurs, rubs, or gallops.  ABDOMEN: Soft, nontender, nondistended. Bowel sounds present. No organomegaly or mass.  EXTREMITIES: No cyanosis, clubbing or edema b/l.    NEUROLOGIC: Cranial nerves II through XII are intact. No focal Motor or sensory deficits b/l.   PSYCHIATRIC:  patient is alert and oriented x 3.  SKIN: No obvious rash, lesion, or ulcer.    LABORATORY PANEL:  CBC  Recent Labs Lab 09/24/16 0638  WBC 21.1*  HGB 11.0*  HCT 33.8*  PLT 196    Chemistries   Recent Labs Lab 09/24/16 0638  NA 142  K 4.2  CL 95*  CO2 31  GLUCOSE 206*  BUN 48*  CREATININE 5.73*  CALCIUM 9.4  AST 243*  ALT 198*  ALKPHOS 258*  BILITOT 2.5*   Cardiac Enzymes  Recent Labs Lab 09/24/16 1110  TROPONINI 0.18*   RADIOLOGY:  Dg Chest Port 1 View  Result Date: 09/24/2016 CLINICAL DATA:  Vomiting, fever. History of end-stage renal disease on dialysis. EXAM: PORTABLE CHEST 1 VIEW COMPARISON:  Chest radiograph April 12, 2016 FINDINGS: Elevated LEFT hemidiaphragm with rounded lucency. Mild bronchitic changes and pulmonary vascular congestion. No pleural effusion. Cardiac silhouette is mildly enlarged and unchanged. Calcified aortic knob. Soft tissue planes and included osseous structures are nonsuspicious. IMPRESSION: LEFT lung base atelectasis, less likely cavitating infiltrate. Recommend follow-up PA and lateral views of the chest when clinically able. Mild cardiomegaly and pulmonary vascular congestion. Similar bronchitic changes. Electronically Signed   By: Elon Alas M.D.   On: 09/24/2016 05:38   US Abdomen Limited Ruq  Result Date: 09/24/2016 CLINICAL DATA:  83 year old male with hepatitis.  Initial encounter. EXAM: ULTRASOUND ABDOMEN LIMITED RIGHT UPPER QUADRANT COMPARISON:  03/16/2013 CT. FINDINGS: Gallbladder: Multiple mobile gallstones. Gallbladder wall thickening measuring up to 5.5 mm. Tiny amount of pericholecystic fluid. Patient was not tender over this region during scanning per ultrasound technologist. Common bile duct: Diameter: 4.2 mm. Liver: No dominant mass. Portal triads slightly echogenic.  Tiny liver calcification. Echogenic right kidney. IMPRESSION: Gallstones. Gallbladder wall thickening and tiny amount of pericholecystic fluid may be related to hepatitis as patient was not tender over this region during  scanning per ultrasound technologist. Cholecystitis could not be excluded in proper clinical setting. Electronically Signed   By: Genia Del M.D.   On: 09/24/2016 14:04   ASSESSMENT AND PLAN:  Michael Acosta  is a 83 y.o. male with a known history of End-stage renal disease on dialysis, dementia, diabetes mellitus, GERD, CVA, hypertension, neuropathy is a resident of  health. Patient was transferred from the facility for vomiting and fever of 10 74F. When patient was evaluated by the EMS his oxygen saturation was 72% on room air and he was put on nonrebreather mask at 100% and his oxygen saturation improved 100% on 10 L by nonrebreather mask  1. Sepsis multifactorial pneumonia/gallbladder disease/cholecystitis -Patient presented with fever 104, tachycardia, leukocytosis and left lower lobe atelectasis versus infiltrate. -IV meropenem -Continue IV pressors  2. Acute hypoxic respiratory failure secondary to lower lobe pneumonia -Respiratory status stable. Wean oxygen as needed. Nebulizer. Continue IV meropenem  3. Hypotension/septic shock -Patient carries a low blood pressure at baseline however blood pressure was in the 50s now on IV dopamine drip and on oral midodrine -Wean IV dopamine and as blood pressure allows  4. Acute pancreatitis/elevated transaminases -Suspected due to gallstone pancreatitis given the abnormal ultrasound of the gallbladder -Patient not tender in the right upper quadrant however ultrasound shows evidence of possible acute cholecystitis -Surgical consultation -Already on IV meropenem  5. End stage renal disease on hemodialysis -Nephrology consultation  6. Leukocytosis due to #1 and 4 No family present in the ICU at present Case discussed with Care Management/Social Worker. Management plans discussed with the patient, family and they are in agreement.  CODE STATUS: Full  DVT Prophylaxis: Heparin  TOTAL CRITICAL TIME TAKING CARE OF THIS PATIENT: 30  minutes.  >50% time spent on counselling and coordination of care  POSSIBLE D/C IN *1-2 DAYS, DEPENDING ON CLINICAL CONDITION.  Note: This dictation was prepared with Dragon dictation along with smaller phrase technology. Any transcriptional errors that result from this process are unintentional.  Rogue Pautler M.D on 09/24/2016 at 2:46 PM  Between 7am to 6pm - Pager - [phone]  After 6pm go to [email or web address] - password [email or web address] [facility]  Office  [phone]  CC: Primary care physician; Marden Noble, MD

## 2016-09-24 NOTE — ED Notes (Signed)
Fluids and antibiotic paused at this time. Lactic acid to be collected in 15 minutes

## 2016-09-24 NOTE — H&P (Signed)
[facility] [place] NAME: Michael Acosta    MR#:  [id number]  DATE OF BIRTH:  06/13/1929  DATE OF ADMISSION:  09/24/2016  PRIMARY CARE PHYSICIAN: Marden Noble, MD   REQUESTING/REFERRING PHYSICIAN:   CHIEF COMPLAINT:   Chief Complaint  Patient presents with  . Emesis    HISTORY OF PRESENT ILLNESS: Michael Acosta  is a 83 y.o. male with a known history of End-stage renal disease on dialysis, dementia, diabetes mellitus, GERD, CVA, hypertension, neuropathy is a resident of [facility]. Patient was transferred from the facility for vomiting and fever of 10 1F. When patient was evaluated by the EMS his oxygen saturation was 72% on room air and he was put on nonrebreather mask at 100% and his oxygen saturation improved 100% on 10 L by nonrebreather mask. Patient got dialyzed yesterday. He has some cough and fever. He also has low blood pressure and was given IV fluids based on sepsis protocol in the emergency room. His lactic acid level was elevated. He is hypotensive blood pressure around 74 / 40 mmHg IV dopamine drip ordered and intensivist consultation done. Patient will need central line for IV fluids and antibiotics and IV pressor medications if needed.  PAST MEDICAL HISTORY:   Past Medical History:  Diagnosis Date  . Anemia   . CVA (cerebral infarction)   . Dementia   . Depression   . Diabetes mellitus without complication ([place])   . Dialysis patient ([place])   . Dysrhythmia    BRADYCARDIA  . Falls frequently   . GERD (gastroesophageal reflux disease)   . Hypertension   . Mood disorder ([place])   . Neuropathy   . Pain    CHRONIC  . Peripheral vascular disease ([place])   . Prostate cancer ([place])   . Renal disorder    ESRD  . Seizures ([place])    EPILEPSY  . Stroke [facility])    TIA  . Weakness generalized     PAST SURGICAL HISTORY:  Past Surgical History:  Procedure Laterality Date  . COLON SURGERY     RESECTION  . INSERTION  OF DIALYSIS CATHETER     SHUNT  . LEG SURGERY Left    MULTIPLE VEIN SURGERIES  . TOTAL HIP ARTHROPLASTY Left     SOCIAL HISTORY:  Social History  Substance Use Topics  . Smoking status: Never Smoker  . Smokeless tobacco: Never Used  . Alcohol use No    FAMILY HISTORY:  Family History  Problem Relation Age of Onset  . CAD Neg Hx   . Diabetes Neg Hx     DRUG ALLERGIES:  Allergies  Allergen Reactions  . Aspirin   . Bc Powder [Aspirin-Salicylamide-Caffeine]   . Penicillins Other (See Comments)    MADE HER BLIND    REVIEW OF SYSTEMS:   CONSTITUTIONAL: Has fever, fatigue, weakness.  EYES: No blurred or double vision.  EARS, NOSE, AND THROAT: No tinnitus or ear pain.  RESPIRATORY: Has cough, shortness of breath,  No wheezing or hemoptysis.  CARDIOVASCULAR: No chest pain, orthopnea, edema.  GASTROINTESTINAL: No nausea, vomiting,  No diarrhea or abdominal pain.  GENITOURINARY: No dysuria, hematuria.  ENDOCRINE: No polyuria, nocturia,  HEMATOLOGY: No anemia, easy bruising or bleeding SKIN: No rash or lesion. MUSCULOSKELETAL: No joint pain or arthritis.   NEUROLOGIC: No tingling, numbness, weakness.  PSYCHIATRY: No anxiety or depression.   MEDICATIONS AT HOME:  Prior to Admission medications   Medication Sig Start Date  End Date Taking? Authorizing Provider  atorvastatin (LIPITOR) 20 MG tablet Take 20 mg by mouth daily.    [provider]  docusate sodium (COLACE) 100 MG capsule Take 100 mg by mouth 2 (two) times daily as needed for mild constipation.    [provider]  gabapentin (NEURONTIN) 100 MG capsule Take 100 mg by mouth at bedtime.    [provider]  guaifenesin (ROBITUSSIN) 100 MG/5ML syrup Take 200 mg by mouth 4 (four) times daily as needed for cough.    [provider]  HYDROcodone-acetaminophen (NORCO/VICODIN) 5-325 MG tablet Take 1 tablet by mouth every 6 (six) hours as needed for moderate pain or severe pain. 04/18/16    Gladstone Lighter, MD  insulin aspart (NOVOLOG) 100 UNIT/ML injection Inject into the skin 4 (four) times daily -  before meals and at bedtime. SLIDING SCALE    [provider]  Insulin Detemir (LEVEMIR FLEXPEN) 100 UNIT/ML Pen Inject 15 Units into the skin at bedtime.     [provider]  lamoTRIgine (LAMICTAL) 100 MG tablet Take 100 mg by mouth at bedtime.    [provider]  levETIRAcetam (KEPPRA) 500 MG tablet Take 500 mg by mouth daily.    [provider]  levofloxacin (LEVAQUIN) 500 MG tablet Take 1 tablet (500 mg total) by mouth every other day. Until 04/29/15 04/18/16   Gladstone Lighter, MD  midodrine (PROAMATINE) 5 MG tablet Take 1 tablet (5 mg total) by mouth 3 (three) times daily with meals. 04/18/16   Gladstone Lighter, MD  omeprazole (PRILOSEC) 20 MG capsule Take 20 mg by mouth daily.     [provider]  ondansetron (ZOFRAN) 4 MG tablet Take 4 mg by mouth every 8 (eight) hours as needed for nausea or vomiting.     [provider]  sevelamer carbonate (RENVELA) 800 MG tablet Take 800 mg by mouth 3 (three) times daily with meals.    [provider]  sitaGLIPtin (JANUVIA) 25 MG tablet Take 25 mg by mouth daily.    [provider]  Vancomycin (VANCOCIN) 750-5 MG/150ML-% SOLN Inject 150 mLs (750 mg total) into the vein every Monday, Wednesday, and Friday with hemodialysis. Until 05/01/16 04/19/16   Gladstone Lighter, MD  vitamin C (ASCORBIC ACID) 500 MG tablet Take 500 mg by mouth daily.    [provider]      PHYSICAL EXAMINATION:   VITAL SIGNS: Blood pressure (!) 64/34, pulse (!) 104, temperature 100.3 F (37.9 C), temperature source Rectal, resp. rate (!) 33, height 5\' 6"  (1.676 m), weight 76.2 kg (168 lb), SpO2 100 %.  GENERAL:  83 y.o.-year-old patient lying in the bed on oxygen via non rebreather mask.  EYES: Pupils equal, round, reactive to light and accommodation. No scleral icterus. Extraocular  muscles intact.  HEENT: Head atraumatic, normocephalic. Oropharynx and nasopharynx clear.  NECK:  Supple, no jugular venous distention. No thyroid enlargement, no tenderness.  LUNGS: Decreased breath sounds bilaterally, rales heard in left lung base.  CARDIOVASCULAR: S1, S2 normal. No murmurs, rubs, or gallops.  ABDOMEN: Soft, nontender, nondistended. Bowel sounds present. No organomegaly or mass.  EXTREMITIES: No pedal edema, cyanosis, or clubbing.  NEUROLOGIC: Cranial nerves II through XII are intact, Awake, respond to verbal command Moves extremities.. Gait not checked.  PSYCHIATRIC: could not be assessed SKIN: No obvious rash, lesion, or ulcer.   LABORATORY PANEL:   CBC No results for input(s): WBC, HGB, HCT, PLT, MCV, MCH, MCHC, RDW, LYMPHSABS, MONOABS, EOSABS, BASOSABS, BANDABS  in the last 168 hours.  Invalid input(s): NEUTRABS, BANDSABD ------------------------------------------------------------------------------------------------------------------  Chemistries  No results for input(s): NA, K, CL, CO2, GLUCOSE, BUN, CREATININE, CALCIUM, MG, AST, ALT, ALKPHOS, BILITOT in the last 168 hours.  Invalid input(s): GFRCGP ------------------------------------------------------------------------------------------------------------------ CrCl cannot be calculated (Patient's most recent lab result is older than the maximum 21 days allowed.). ------------------------------------------------------------------------------------------------------------------ No results for input(s): TSH, T4TOTAL, T3FREE, THYROIDAB in the last 72 hours.  Invalid input(s): FREET3   Coagulation profile No results for input(s): INR, PROTIME in the last 168 hours. ------------------------------------------------------------------------------------------------------------------- No results for input(s): DDIMER in the last 72  hours. -------------------------------------------------------------------------------------------------------------------  Cardiac Enzymes No results for input(s): CKMB, TROPONINI, MYOGLOBIN in the last 168 hours.  Invalid input(s): CK ------------------------------------------------------------------------------------------------------------------ Invalid input(s): POCBNP  ---------------------------------------------------------------------------------------------------------------  Urinalysis    Component Value Date/Time   COLORURINE Yellow 04/25/2013 0645   APPEARANCEUR Cloudy 04/25/2013 0645   LABSPEC 1.015 04/25/2013 0645   PHURINE 6.0 04/25/2013 0645   GLUCOSEU >=500 04/25/2013 0645   HGBUR 3+ 04/25/2013 0645   BILIRUBINUR Negative 04/25/2013 0645   KETONESUR Negative 04/25/2013 0645   PROTEINUR >=500 04/25/2013 0645   NITRITE Negative 04/25/2013 0645   LEUKOCYTESUR 2+ 04/25/2013 0645     RADIOLOGY: Dg Chest Port 1 View  Result Date: 09/24/2016 CLINICAL DATA:  Vomiting, fever. History of end-stage renal disease on dialysis. EXAM: PORTABLE CHEST 1 VIEW COMPARISON:  Chest radiograph April 12, 2016 FINDINGS: Elevated LEFT hemidiaphragm with rounded lucency. Mild bronchitic changes and pulmonary vascular congestion. No pleural effusion. Cardiac silhouette is mildly enlarged and unchanged. Calcified aortic knob. Soft tissue planes and included osseous structures are nonsuspicious. IMPRESSION: LEFT lung base atelectasis, less likely cavitating infiltrate. Recommend follow-up PA and lateral views of the chest when clinically able. Mild cardiomegaly and pulmonary vascular congestion. Similar bronchitic changes. Electronically Signed   By: Elon Alas M.D.   On: 09/24/2016 05:38    EKG: Orders placed or performed during the hospital encounter of 09/24/16  . ED EKG 12-Lead  . ED EKG 12-Lead  . EKG 12-Lead  . EKG 12-Lead    IMPRESSION AND PLAN: 83 year old elderly  male patient resident of a [facility] facility with history of end-stage renal disease on dialysis, dementia, CVA, GERD, hypertension, diabetes mellitus presented to the emergency room with vomiting, shortness of breath and hypoxia. Admitting diagnosis 1. Acute hypoxic respiratory failure 2. Sepsis 3. Healthcare associated pneumonia 4. Hypotension 5. End-stage renal disease on dialysis Treatment plan Admit patient to ICU Oxygen via nonrebreather mask Start patient on IV vancomycin and IV Aztreonam antibiotic Nephrology consultation for dialysis Intensivist consultation for hypotension and sepsis and respiratory failure DVT prophylaxis subcutaneous heparin  All the records are reviewed and case discussed with ED provider. Management plans discussed with the patient, family and they are in agreement.  CODE STATUS:FULL CODE Surrogate decision maker : wife Code Status History    Date Active Date Inactive Code Status Order ID Comments User Context   04/13/2016 12:22 AM 04/18/2016  6:46 PM Full Code [id number]  Holley Raring, NP ED   07/11/2013  3:42 PM 08/02/2013  6:20 PM Full Code [id number]  Genella Rife Inpatient       TOTAL CRITICAL CARE TIME TAKING CARE OF THIS PATIENT: 56 minutes.    Saundra Shelling M.D on 09/24/2016 at 7:18 AM  Between 7am to 6pm - Pager - [phone]  After 6pm go to [email or web address] - password [email or web address] [facility]  Office  [phone]  CC: Primary care physician; Nicky Pugh  B, MD

## 2016-09-24 NOTE — Progress Notes (Addendum)
0900 Patient admitted from ED via stretcher. Right and left leg scars,midline abdominal scar noted as well as amputated toes on right foot. Patient and wife are poor historians.

## 2016-09-24 NOTE — Consult Note (Signed)
Michael [facility]      Assessment and Plan:  Pneumonia with septic shock/hypotension. -Continue broad-spectrum antibiotics, wean down pressors as tolerated.  Acute pancreatitis, possibly contributing to sepsis. -Patient has been started on Merrem, will continue.  Metabolic encephalopathy, acute -Likely secondary to above, continue supportive measures.  Acute hepatitis. -Uncertain etiology, will check acute hepatitis panel, right upper quadrant ultrasound.  End-stage renal disease. -Nephrology consulted, on hemodialysis.   Date: 09/24/2016  MRN# [id number] Michael Acosta June 28, 1929  Referring Physician: Dr. Estanislado Pandy for dyspnea.   Michael [facility] is a 83 y.o. old male seen in [facility] for chief complaint of:    Chief Complaint  Patient presents with  . Emesis    HPI:  Michael [facility]  is a 83 y.o. male with a known history of End-stage renal disease on dialysis, dementia, diabetes mellitus, GERD, CVA, hypertension, neuropathy is a resident of [place]. tient was transferred from the facility for vomiting and fever of 104F. Currently the patient is nonverbal, therefore, all history is obtained from the chart, staff. He is noted to be febrile on presentation. Lipase was elevated at 2491, AST 243, ALT 198, lactic acid equals 2.1.  I personally reviewed. Chest x-ray imaging, this showed elevated left diaphragmatic leaflet with left lower lobe atelectasis, and stomach bubble seen in this area, less likely a cavitary left lung lesion. Previous echo with EF equals 55% on 04/15/16.  PMHX:   Past Medical History:  Diagnosis Date  . Anemia   . CVA (cerebral infarction)   . Dementia   . Depression   . Diabetes mellitus without complication ([place])   . Dialysis patient ([place])   . Dysrhythmia    BRADYCARDIA  . Falls frequently   . GERD (gastroesophageal reflux disease)   . Hypertension   . Mood disorder ([place])   . Neuropathy   . Pain    CHRONIC  . Peripheral vascular disease ([place])   . Prostate cancer ([place])   . Renal disorder    ESRD  . Seizures ([place])    EPILEPSY  . Stroke [facility])    TIA  . Weakness generalized    Surgical Hx:  Past Surgical History:  Procedure Laterality Date  . COLON SURGERY     RESECTION  . INSERTION OF DIALYSIS CATHETER     SHUNT  . LEG SURGERY Left    MULTIPLE VEIN SURGERIES  . TOTAL HIP ARTHROPLASTY Left    Family Hx:  Family History  Problem Relation Age of Onset  . CAD Neg Hx   . Diabetes Neg Hx    Social Hx:   Social History  Substance Use Topics  . Smoking status: Never Smoker  . Smokeless tobacco: Never Used  . Alcohol use No   Medication:    Current Facility-Administered Medications:  .  0.9 %  sodium chloride infusion, , Intravenous, Continuous, Pyreddy, Pavan, MD .  acetaminophen (TYLENOL) tablet 650 mg, 650 mg, Oral, Q6H PRN **OR** acetaminophen (TYLENOL) suppository 650 mg, 650 mg, Rectal, Q6H PRN, Pyreddy, Pavan, MD .  acetaminophen (TYLENOL) suppository 975 mg, 975 mg, Rectal, Once, Paulette Blanch, MD .  atorvastatin (LIPITOR) tablet 20 mg, 20 mg, Oral, q1800, Pyreddy, Pavan, MD .  DOPamine (INTROPIN) 800 mg in dextrose 5 % 250 mL (3.2 mg/mL) infusion, 0-20 mcg/kg/min, Intravenous, Titrated, Pyreddy, Pavan, MD, Last Rate: 12.9 mL/hr at 09/24/16 1011, 9 mcg/kg/min at 09/24/16 1011 .  heparin injection 5,000 Units, 5,000 Units, Subcutaneous, Q8H, Pyreddy, Reatha Harps, MD .  heparin injection 5,000 Units, 5,000 Units, Subcutaneous, Q8H, Shakiyah Cirilo, MD .  ipratropium-albuterol (DUONEB) 0.5-2.5 (3) MG/3ML nebulizer solution 3 mL, 3 mL, Nebulization, Q6H, Ashby Dawes, Kimba Lottes, MD .  lamoTRIgine (LAMICTAL) tablet 100 mg, 100 mg, Oral, QHS, Pyreddy, Pavan, MD .  levETIRAcetam (KEPPRA) tablet 500 mg, 500 mg, Oral, Daily, Pyreddy, Pavan, MD .  midodrine (PROAMATINE) tablet 5 mg, 5 mg, Oral, TID WC, Pyreddy, Pavan, MD .  ondansetron (ZOFRAN) tablet 4 mg, 4 mg, Oral, Q6H PRN  **OR** ondansetron (ZOFRAN) injection 4 mg, 4 mg, Intravenous, Q6H PRN, Pyreddy, Reatha Harps, MD .  Derrill Memo ON 09/25/2016] pantoprazole (PROTONIX) EC tablet 40 mg, 40 mg, Oral, Daily, Pyreddy, Pavan, MD .  senna-docusate (Senokot-S) tablet 1 tablet, 1 tablet, Oral, QHS PRN, Pyreddy, Pavan, MD .  sevelamer carbonate (RENVELA) tablet 800 mg, 800 mg, Oral, TID WC, Pyreddy, Pavan, MD .  vancomycin (VANCOCIN) 1,750 mg in sodium chloride 0.9 % 500 mL IVPB, 1,750 mg, Intravenous, Once, Paulette Blanch, MD .  vancomycin (VANCOCIN) IVPB 750 mg/150 ml premix, 750 mg, Intravenous, Q dialysis, Paulette Blanch, MD   Allergies:  Aspirin; Bc powder [aspirin-salicylamide-caffeine]; and Penicillins  Review of Systems:  Could not be obtained due to altered mental status.  Physical Examination:   VS: BP (!) 98/47   Pulse (!) 110   Temp 99 F (37.2 C) (Axillary)   Resp 17   Ht 5\' 6"  (1.676 m)   Wt 168 lb (76.2 kg)   SpO2 100%   BMI 27.12 kg/m   General Appearance: No distress  Neuro:without focal findings,  speech normal,  HEENT: PERRLA, EOM intact.   Pulmonary: normal breath sounds, No wheezing.  CardiovascularNormal S1,S2.  No m/r/g.   Abdomen: Benign, Soft, non-tender. Renal:  No costovertebral tenderness  GU:  No performed at this time. Endoc: No evident thyromegaly, no signs of acromegaly. Skin:   warm, no rashes, no ecchymosis  Extremities: normal, no cyanosis, clubbing.  Other findings:    LABORATORY PANEL:   CBC  Recent Labs Lab 09/24/16 0638  WBC 21.1*  HGB 11.0*  HCT 33.8*  PLT 196   ------------------------------------------------------------------------------------------------------------------  Chemistries   Recent Labs Lab 09/24/16 0638  NA 142  K 4.2  CL 95*  CO2 31  GLUCOSE 206*  BUN 48*  CREATININE 5.73*  CALCIUM 9.4  AST 243*  ALT 198*  ALKPHOS 258*  BILITOT 2.5*    ------------------------------------------------------------------------------------------------------------------  Cardiac Enzymes  Recent Labs Lab 09/24/16 0638  TROPONINI 0.07*   ------------------------------------------------------------  RADIOLOGY:  Dg Chest Port 1 View  Result Date: 09/24/2016 CLINICAL DATA:  Vomiting, fever. History of end-stage renal disease on dialysis. EXAM: PORTABLE CHEST 1 VIEW COMPARISON:  Chest radiograph April 12, 2016 FINDINGS: Elevated LEFT hemidiaphragm with rounded lucency. Mild bronchitic changes and pulmonary vascular congestion. No pleural effusion. Cardiac silhouette is mildly enlarged and unchanged. Calcified aortic knob. Soft tissue planes and included osseous structures are nonsuspicious. IMPRESSION: LEFT lung base atelectasis, less likely cavitating infiltrate. Recommend follow-up PA and lateral views of the chest when clinically able. Mild cardiomegaly and pulmonary vascular congestion. Similar bronchitic changes. Electronically Signed   By: Elon Alas M.D.   On: 09/24/2016 05:38       Thank  you for the [facility] and for allowing [facility] to assist in the care of your patient. Our recommendations are noted above.  Please contact us if we can be of further service.   Marda Stalker, MD.  Board Certified in Internal  Medicine, Pulmonary Medicine, Critical Care Medicine, and Sleep Medicine.   Pulmonary and Critical Care Office Number: [phone]  Patricia Pesa, M.D.  Merton Border, M.D  09/24/2016

## 2016-09-24 NOTE — ED Notes (Signed)
Date and time results received: 09/24/16 0750 (use smartphrase ".now" to insert current time)  Test: troponin Critical Value: 0.07  Name of Provider Notified: Dr. Corky Downs  Orders Received? Or Actions Taken?: Orders Received - See Orders for details

## 2016-09-24 NOTE — ED Provider Notes (Signed)
[facility] Emergency Department Provider Note   ____________________________________________   First MD Initiated Contact with Patient 09/24/16 [phone]     (approximate)  I have reviewed the triage vital signs and the nursing notes.   HISTORY  Chief Complaint Emesis    HPI Michael [facility] is a 83 y.o. male brought to the ED via EMS from [place] care with a complaint of fever, vomiting and shortness of breath. Patient has a history of end-stage renal disease on hemodialysis Monday/Wednesday/Friday. Reportedly had dialysis yesterday and was extremely lethargic afterwards which is not his baseline. Staff called EMS tonight for fever of 104F accompanied by shortness of breath and 2 episodes of vomiting.EMS reports room air saturation 72%; patient was placed on nonrebreather with sats 100%. Reports coughing. Denies chest pain, abdominal pain, diarrhea. States he does not produce urine.   Past Medical History:  Diagnosis Date  . Anemia   . CVA (cerebral infarction)   . Dementia   . Depression   . Diabetes mellitus without complication ([place])   . Dialysis patient ([place])   . Dysrhythmia    BRADYCARDIA  . Falls frequently   . GERD (gastroesophageal reflux disease)   . Hypertension   . Mood disorder ([place])   . Neuropathy   . Pain    CHRONIC  . Peripheral vascular disease ([place])   . Prostate cancer ([place])   . Renal disorder    ESRD  . Seizures ([place])    EPILEPSY  . Stroke [facility])    TIA  . Weakness generalized     Patient Active Problem List   Diagnosis Date Noted  . Sepsis ([place]) 09/24/2016  . CAP (community acquired pneumonia) 04/13/2016    Past Surgical History:  Procedure Laterality Date  . COLON SURGERY     RESECTION  . INSERTION OF DIALYSIS CATHETER     SHUNT  . LEG SURGERY Left    MULTIPLE VEIN SURGERIES  . TOTAL HIP ARTHROPLASTY Left     Prior to Admission medications   Medication Sig Start Date End Date Taking? Authorizing  Provider  atorvastatin (LIPITOR) 20 MG tablet Take 20 mg by mouth daily.    [provider]  docusate sodium (COLACE) 100 MG capsule Take 100 mg by mouth 2 (two) times daily as needed for mild constipation.    [provider]  gabapentin (NEURONTIN) 100 MG capsule Take 100 mg by mouth at bedtime.    [provider]  guaifenesin (ROBITUSSIN) 100 MG/5ML syrup Take 200 mg by mouth 4 (four) times daily as needed for cough.    [provider]  HYDROcodone-acetaminophen (NORCO/VICODIN) 5-325 MG tablet Take 1 tablet by mouth every 6 (six) hours as needed for moderate pain or severe pain. 04/18/16   Gladstone Lighter, MD  insulin aspart (NOVOLOG) 100 UNIT/ML injection Inject into the skin 4 (four) times daily -  before meals and at bedtime. SLIDING SCALE    [provider]  Insulin Detemir (LEVEMIR FLEXPEN) 100 UNIT/ML Pen Inject 15 Units into the skin at bedtime.     [provider]  lamoTRIgine (LAMICTAL) 100 MG tablet Take 100 mg by mouth at bedtime.    [provider]  levETIRAcetam (KEPPRA) 500 MG tablet Take 500 mg by mouth daily.    [provider]  levofloxacin (LEVAQUIN) 500 MG tablet Take 1 tablet (500 mg total) by mouth every other day. Until 04/29/15 04/18/16   Gladstone Lighter, MD  midodrine (PROAMATINE) 5 MG tablet  Take 1 tablet (5 mg total) by mouth 3 (three) times daily with meals. 04/18/16   Gladstone Lighter, MD  omeprazole (PRILOSEC) 20 MG capsule Take 20 mg by mouth daily.     [provider]  ondansetron (ZOFRAN) 4 MG tablet Take 4 mg by mouth every 8 (eight) hours as needed for nausea or vomiting.     [provider]  sevelamer carbonate (RENVELA) 800 MG tablet Take 800 mg by mouth 3 (three) times daily with meals.    [provider]  sitaGLIPtin (JANUVIA) 25 MG tablet Take 25 mg by mouth daily.    [provider]  Vancomycin (VANCOCIN) 750-5 MG/150ML-% SOLN Inject 150 mLs (750 mg  total) into the vein every Monday, Wednesday, and Friday with hemodialysis. Until 05/01/16 04/19/16   Gladstone Lighter, MD  vitamin C (ASCORBIC ACID) 500 MG tablet Take 500 mg by mouth daily.    [provider]    Allergies Aspirin; Bc powder [aspirin-salicylamide-caffeine]; and Penicillins  No family history on file.  Social History Social History  Substance Use Topics  . Smoking status: Never Smoker  . Smokeless tobacco: Never Used  . Alcohol use No    Review of Systems  Constitutional: Positive for fever/chills. Eyes: No visual changes. ENT: No sore throat. Cardiovascular: Denies chest pain. Respiratory: Positive for cough and shortness of breath. Gastrointestinal: No abdominal pain.  Positive for vomiting.  No diarrhea.  No constipation. Genitourinary: Negative for dysuria. Musculoskeletal: Negative for back pain. Skin: Negative for rash. Neurological: Negative for headaches, focal weakness or numbness.   ____________________________________________   PHYSICAL EXAM:  VITAL SIGNS: ED Triage Vitals  Enc Vitals Group     BP 09/24/16 0448 103/72     Pulse Rate 09/24/16 0448 (!) 130     Resp 09/24/16 0448 (!) 44     Temp 09/24/16 0448 (!) 101.8 F (38.8 C)     Temp Source 09/24/16 0448 Oral     SpO2 09/24/16 0448 100 %     Weight 09/24/16 0449 168 lb (76.2 kg)     Height 09/24/16 0449 5\' 6"  (1.676 m)     Head Circumference --      Peak Flow --      Pain Score --      Pain Loc --      Pain Edu? --      Excl. in [place]? --     Constitutional: Alert and oriented. Ill appearing and in moderate acute distress. Eyes: Conjunctivae are normal.  Head: Atraumatic. Nose: No congestion/rhinnorhea. Mouth/Throat: Mucous membranes are moist.  Oropharynx non-erythematous. Neck: No stridor.  Supple neck without meningismus. Cardiovascular: Tachycardic rate, regular rhythm. Grossly normal heart sounds.  Good peripheral circulation. Respiratory: Increased respiratory  effort.  No retractions. Lungs with diffuse rales and rhonchi. Gastrointestinal: Soft and nontender to light and deep palpation. No distention. No abdominal bruits. No CVA tenderness. Musculoskeletal: LUE AV fistula with palpable thrill. No lower extremity tenderness nor edema.  No joint effusions. Neurologic:  Normal speech and language. No gross focal neurologic deficits are appreciated. MAEx4. Skin:  Skin is warm, dry and intact. No rash noted. No petechiae.  Psychiatric: Mood and affect are normal. Speech and behavior are normal.  ____________________________________________   LABS (all labs ordered are listed, but only abnormal results are displayed)  Labs Reviewed  LACTIC ACID, PLASMA - Abnormal; Notable for the following:       Result Value   Lactic Acid, Venous 3.2 (*)  All other components within normal limits  BLOOD GAS, ARTERIAL - Abnormal; Notable for the following:    pH, Arterial 7.48 (*)    pO2, Arterial 81 (*)    Bicarbonate 30.5 (*)    Acid-Base Excess 6.4 (*)    All other components within normal limits  CULTURE, BLOOD (ROUTINE X 2)  CULTURE, BLOOD (ROUTINE X 2)  URINE CULTURE  COMPREHENSIVE METABOLIC PANEL  CBC WITH DIFFERENTIAL/PLATELET  URINALYSIS, ROUTINE W REFLEX MICROSCOPIC  LACTIC ACID, PLASMA  LIPASE, BLOOD  TROPONIN I   ____________________________________________  EKG  ED ECG REPORT I, SUNG,JADE J, the attending physician, personally viewed and interpreted this ECG.   Date: 09/24/2016  EKG Time: 0448  Rate: 130  Rhythm: sinus tachycardia  Axis: Normal  Intervals:none  ST&T Change: Nonspecific  ____________________________________________  RADIOLOGY  Dg Chest Port 1 View  Result Date: 09/24/2016 CLINICAL DATA:  Vomiting, fever. History of end-stage renal disease on dialysis. EXAM: PORTABLE CHEST 1 VIEW COMPARISON:  Chest radiograph April 12, 2016 FINDINGS: Elevated LEFT hemidiaphragm with rounded lucency. Mild bronchitic changes  and pulmonary vascular congestion. No pleural effusion. Cardiac silhouette is mildly enlarged and unchanged. Calcified aortic knob. Soft tissue planes and included osseous structures are nonsuspicious. IMPRESSION: LEFT lung base atelectasis, less likely cavitating infiltrate. Recommend follow-up PA and lateral views of the chest when clinically able. Mild cardiomegaly and pulmonary vascular congestion. Similar bronchitic changes. Electronically Signed   By: Elon Alas M.D.   On: 09/24/2016 05:38    ____________________________________________   PROCEDURES  Procedure(s) performed: None  Procedures  Critical Care performed: Yes, see critical care note(s)   CRITICAL CARE Performed by: Paulette Blanch   Total critical care time: 45 minutes  Critical care time was exclusive of separately billable procedures and treating other patients.  Critical care was necessary to treat or prevent imminent or life-threatening deterioration.  Critical care was time spent personally by me on the following activities: development of treatment plan with patient and/or surrogate as well as nursing, discussions with consultants, evaluation of patient's response to treatment, examination of patient, obtaining history from patient or surrogate, ordering and performing treatments and interventions, ordering and review of laboratory studies, ordering and review of radiographic studies, pulse oximetry and re-evaluation of patient's condition.  ____________________________________________   INITIAL IMPRESSION / ASSESSMENT AND PLAN / ED COURSE  Pertinent labs & imaging results that were available during my care of the patient were reviewed by me and considered in my medical decision making (see chart for details).  83 year old male who presents with fever, respiratory distress, vomiting. ED code sepsis initiated immediately upon patient's arrival to the treatment room. Given that patient is a dialysis patient,  will administer judicious IV fluids for now pending results of lactate. Tylenol suppository administered for fever. Anticipate hospitalization.  Clinical Course as of Sep 24 708  Tue Sep 24, 2016  0707 Patient became hypotensive, BP 64/34. After completion of the first 500 mL of IV fluid, SBP is in the 90s. Sats 100% on nonrebreather. Patient responsive and answering questions appropriately. Discussed with both hospitalist as well as critical care NP for admission. Labs pending.  [JS]    Clinical Course User Index [JS] Paulette Blanch, MD     ____________________________________________   FINAL CLINICAL IMPRESSION(S) / ED DIAGNOSES  Final diagnoses:  Fever, unspecified fever cause  Sepsis, due to unspecified organism ([place])  HCAP (healthcare-associated pneumonia)  Hypotension, unspecified hypotension type      NEW MEDICATIONS STARTED DURING THIS VISIT:  New Prescriptions   No medications on file     Note:  This document was prepared using Dragon voice recognition software and may include unintentional dictation errors.    Paulette Blanch, MD 09/24/16 [phone]

## 2016-09-24 NOTE — ED Notes (Signed)
Attempted to assist to toilet with Jenina R.N.

## 2016-09-24 NOTE — ED Triage Notes (Addendum)
Pt arrived via EMS from [facility] facility with complaints of vomiting. EMS reported that he vomited x2 around 10:00pm last night and it was a dark brown color. EMS also reported that he had a fever spike of 104. EMS inserted a 22G in the right hand. Pt was at 72% on RA so EMS put him on a non-rebreather and he is now at 100% at 10L. He is not normally on oxygen.  Pt had dialysis on yesterday and was extremely lethargic afterwards which is not his norm. VS per EMS were BP-147/86 HR-136 BS-220. Pt is alert and lethargic. Dr. Beather Arbour at bedside. Pt sounds very congested and junky.

## 2016-09-24 NOTE — ED Notes (Signed)
Family updated on plans for pt. Family at bedside with pt.

## 2016-09-25 ENCOUNTER — Encounter: Payer: Self-pay | Admitting: Physician Assistant

## 2016-09-25 ENCOUNTER — Inpatient Hospital Stay (HOSPITAL_COMMUNITY)
Admit: 2016-09-25 | Discharge: 2016-09-25 | Disposition: A | Discharge status: Home / Self Care | Payer: Medicare Other | Attending: Physician Assistant | Admitting: Physician Assistant

## 2016-09-25 DIAGNOSIS — R509 Fever, unspecified: Secondary | ICD-10-CM

## 2016-09-25 DIAGNOSIS — Z794 Long term (current) use of insulin: Secondary | ICD-10-CM

## 2016-09-25 DIAGNOSIS — Z7189 Other specified counseling: Secondary | ICD-10-CM

## 2016-09-25 DIAGNOSIS — IMO0001 Reserved for inherently not codable concepts without codable children: Secondary | ICD-10-CM

## 2016-09-25 DIAGNOSIS — N186 End stage renal disease: Secondary | ICD-10-CM

## 2016-09-25 DIAGNOSIS — K819 Cholecystitis, unspecified: Secondary | ICD-10-CM | POA: Diagnosis present

## 2016-09-25 DIAGNOSIS — Z515 Encounter for palliative care: Secondary | ICD-10-CM

## 2016-09-25 DIAGNOSIS — I214 Non-ST elevation (NSTEMI) myocardial infarction: Secondary | ICD-10-CM | POA: Diagnosis present

## 2016-09-25 DIAGNOSIS — Z992 Dependence on renal dialysis: Secondary | ICD-10-CM

## 2016-09-25 DIAGNOSIS — R6521 Severe sepsis with septic shock: Secondary | ICD-10-CM

## 2016-09-25 DIAGNOSIS — R748 Abnormal levels of other serum enzymes: Secondary | ICD-10-CM

## 2016-09-25 DIAGNOSIS — E119 Type 2 diabetes mellitus without complications: Secondary | ICD-10-CM

## 2016-09-25 DIAGNOSIS — A419 Sepsis, unspecified organism: Secondary | ICD-10-CM

## 2016-09-25 DIAGNOSIS — F0390 Unspecified dementia without behavioral disturbance: Secondary | ICD-10-CM | POA: Diagnosis present

## 2016-09-25 DIAGNOSIS — I959 Hypotension, unspecified: Secondary | ICD-10-CM

## 2016-09-25 DIAGNOSIS — R7989 Other specified abnormal findings of blood chemistry: Secondary | ICD-10-CM

## 2016-09-25 DIAGNOSIS — I739 Peripheral vascular disease, unspecified: Secondary | ICD-10-CM

## 2016-09-25 DIAGNOSIS — R7881 Bacteremia: Secondary | ICD-10-CM

## 2016-09-25 LAB — CBC
HEMATOCRIT: 34.3 % — AB (ref 40.0–52.0)
HEMOGLOBIN: 11.1 g/dL — AB (ref 13.0–18.0)
MCH: 31 pg (ref 26.0–34.0)
MCHC: 32.3 g/dL (ref 32.0–36.0)
MCV: 95.7 fL (ref 80.0–100.0)
Platelets: 193 10*3/uL (ref 150–440)
RBC: 3.58 MIL/uL — ABNORMAL LOW (ref 4.40–5.90)
RDW: 15.8 % — AB (ref 11.5–14.5)
WBC: 14.8 10*3/uL — ABNORMAL HIGH (ref 3.8–10.6)

## 2016-09-25 LAB — COMPREHENSIVE METABOLIC PANEL
ALBUMIN: 3.4 g/dL — AB (ref 3.5–5.0)
ALK PHOS: 212 U/L — AB (ref 38–126)
ALT: 156 U/L — ABNORMAL HIGH (ref 17–63)
ANION GAP: 13 (ref 5–15)
AST: 139 U/L — ABNORMAL HIGH (ref 15–41)
BILIRUBIN TOTAL: 1.6 mg/dL — AB (ref 0.3–1.2)
BUN: 70 mg/dL — ABNORMAL HIGH (ref 6–20)
CALCIUM: 8.7 mg/dL — AB (ref 8.9–10.3)
CO2: 27 mmol/L (ref 22–32)
Chloride: 100 mmol/L — ABNORMAL LOW (ref 101–111)
Creatinine, Ser: 6.96 mg/dL — ABNORMAL HIGH (ref 0.61–1.24)
GFR calc non Af Amer: 6 mL/min — ABNORMAL LOW (ref >60)
GFR, EST AFRICAN AMERICAN: 7 mL/min — AB (ref >60)
GLUCOSE: 191 mg/dL — AB (ref 65–99)
Potassium: 5 mmol/L (ref 3.5–5.1)
Sodium: 140 mmol/L (ref 135–145)
TOTAL PROTEIN: 7.8 g/dL (ref 6.5–8.1)

## 2016-09-25 LAB — ECHOCARDIOGRAM COMPLETE
HEIGHTINCHES: 66 in
WEIGHTICAEL: 2846.58 [oz_av]

## 2016-09-25 LAB — BLOOD CULTURE ID PANEL (REFLEXED)

## 2016-09-25 LAB — TROPONIN I
TROPONIN I: 0.77 ng/mL — AB (ref <0.03)
TROPONIN I: 1.45 ng/mL — AB (ref <0.03)
TROPONIN I: 1.21 ng/mL — AB (ref <0.03)
TROPONIN I: 0.99 ng/mL — AB (ref <0.03)

## 2016-09-25 LAB — HEPARIN LEVEL (UNFRACTIONATED): HEPARIN UNFRACTIONATED: 0.42 [IU]/mL (ref 0.30–0.70)

## 2016-09-25 LAB — TRIGLYCERIDES: Triglycerides: 97 mg/dL (ref <150)

## 2016-09-25 LAB — HEPATITIS PANEL, ACUTE
HEP B S AG: NEGATIVE
Hep A IgM: NEGATIVE
Hep B C IgM: NEGATIVE

## 2016-09-25 LAB — GLUCOSE, CAPILLARY
GLUCOSE-CAPILLARY: 106 mg/dL — AB (ref 65–99)
GLUCOSE-CAPILLARY: 184 mg/dL — AB (ref 65–99)
Glucose-Capillary: 174 mg/dL — ABNORMAL HIGH (ref 65–99)
Glucose-Capillary: 236 mg/dL — ABNORMAL HIGH (ref 65–99)
Glucose-Capillary: 246 mg/dL — ABNORMAL HIGH (ref 65–99)

## 2016-09-25 LAB — AMYLASE: Amylase: 1645 U/L — ABNORMAL HIGH (ref 28–100)

## 2016-09-25 LAB — LIPASE, BLOOD: LIPASE: 1189 U/L — AB (ref 11–51)

## 2016-09-25 LAB — MAGNESIUM: Magnesium: 1.8 mg/dL (ref 1.7–2.4)

## 2016-09-25 LAB — PROTIME-INR
INR: 1.16
Prothrombin Time: 14.9 seconds (ref 11.4–15.2)

## 2016-09-25 LAB — APTT: aPTT: 35 seconds (ref 24–36)

## 2016-09-25 MED ORDER — SODIUM CHLORIDE 0.9 % IV SOLN
500.0000 mg | Freq: Once | INTRAVENOUS | Status: DC
  Filled 2016-09-25: qty 0.5

## 2016-09-25 MED ORDER — MEROPENEM-SODIUM CHLORIDE 500 MG/50ML IV SOLR
500.0000 mg | INTRAVENOUS | Status: DC
  Filled 2016-09-25: qty 50

## 2016-09-25 MED ORDER — HEPARIN (PORCINE) IN NACL 100-0.45 UNIT/ML-% IJ SOLN
1050.0000 [IU]/h | INTRAMUSCULAR | Status: DC
  Administered 2016-09-25: 900 [IU]/h via INTRAVENOUS

## 2016-09-25 MED ORDER — SEVELAMER CARBONATE 0.8 G PO PACK
0.8000 g | PACK | Freq: Three times a day (TID) | ORAL | Status: DC
  Administered 2016-09-25 – 2016-09-29 (×12): 0.8 g via ORAL
  Filled 2016-09-25 (×16): qty 1

## 2016-09-25 MED ORDER — LEVETIRACETAM 100 MG/ML PO SOLN
500.0000 mg | Freq: Two times a day (BID) | ORAL | Status: DC
  Administered 2016-09-25 – 2016-09-29 (×9): 500 mg via ORAL
  Filled 2016-09-25 (×10): qty 5

## 2016-09-25 MED ORDER — MEROPENEM-SODIUM CHLORIDE 500 MG/50ML IV SOLR
500.0000 mg | INTRAVENOUS | Status: DC
  Administered 2016-09-26: 500 mg via INTRAVENOUS
  Filled 2016-09-25 (×3): qty 50

## 2016-09-25 MED ORDER — PANTOPRAZOLE SODIUM 40 MG PO PACK
40.0000 mg | PACK | Freq: Every day | ORAL | Status: DC
  Administered 2016-09-25 – 2016-09-28 (×4): 40 mg via ORAL
  Filled 2016-09-25 (×7): qty 20

## 2016-09-25 MED ORDER — MEROPENEM-SODIUM CHLORIDE 500 MG/50ML IV SOLR
500.0000 mg | Freq: Once | INTRAVENOUS | Status: AC
  Administered 2016-09-25: 500 mg via INTRAVENOUS
  Filled 2016-09-25: qty 50

## 2016-09-25 MED ORDER — HEPARIN BOLUS VIA INFUSION
4000.0000 [IU] | Freq: Once | INTRAVENOUS | Status: AC
  Administered 2016-09-25: 4000 [IU] via INTRAVENOUS
  Filled 2016-09-25: qty 4000

## 2016-09-25 NOTE — Progress Notes (Signed)
*  PRELIMINARY RESULTS* Echocardiogram 2D Echocardiogram has been performed.  [name] 09/25/2016, 3:23 PM

## 2016-09-25 NOTE — Consult Note (Signed)
Patient ID: Michael Acosta, male   DOB: 08-Jun-1929, 83 y.o.   MRN: [id number]  CC: Pancreatitis  HPI Michael Acosta is a 83 y.o. male currently admitted in the ICU for treatment of pneumonia with septic shock. General surgery consult requested by Dr. Posey Pronto for evaluation of possible gallstone pancreatitis. Patient was admitted approximately 24 hours ago due to fever and emesis. His known history of end-stage renal disease, dementia, diabetes, GERD, stroke, neuropathy. Upon questioning the patient is morning he denies any abdominal pain. He states he hasn't been eating for a few days secondary to thinks is not tasting well. Has some flight of ideas during conversation. Patient currently denies any fevers, chills, nausea, vomiting, chest pain, shortness breath, diarrhea, constipation. Unsure if review of systems is accurate due to his mental status. On admission patient was noted to have multiple LFT abnormalities including elevated lipase and amylase. Also found to have multiple gallstones and pericholecystic fluid.  HPI  Past Medical History:  Diagnosis Date  . Anemia of chronic disease   . Chronic pain disorder   . Dementia   . Depression   . ESRD on hemodialysis ([place])    a. MWF  . Falls frequently   . GERD (gastroesophageal reflux disease)   . Hypertension   . Insulin dependent diabetes mellitus with complications ([place])   . Mood disorder ([place])   . Peripheral neuropathy    a. diabetic neuropathy  . Peripheral vascular disease ([place])    a. s/p prior toe amputation; b. followed by [facility]  . Prostate cancer ([place])   . Seizures ([place])    EPILEPSY  . Stroke [facility])    TIA  . Weakness generalized     Past Surgical History:  Procedure Laterality Date  . COLON SURGERY     RESECTION  . INSERTION OF DIALYSIS CATHETER     SHUNT  . LEG SURGERY Left    MULTIPLE VEIN SURGERIES  . TOTAL HIP ARTHROPLASTY Left     Family History  Problem Relation Age of Onset  . CAD Neg Hx   . Diabetes Neg  Hx     Social History Social History  Substance Use Topics  . Smoking status: Never Smoker  . Smokeless tobacco: Never Used  . Alcohol use No    Allergies  Allergen Reactions  . Aspirin   . Bc Powder [Aspirin-Salicylamide-Caffeine]   . Penicillins Other (See Comments)    Has patient had a PCN reaction causing immediate rash, facial/tongue/throat swelling, SOB or lightheadedness with hypotension: Unknown Has patient had a PCN reaction causing severe rash involving mucus membranes or skin necrosis: Unknown Has patient had a PCN reaction that required hospitalization: Unknown Has patient had a PCN reaction occurring within the last 10 years: Unknown If all of the above answers are "NO", then may proceed with Cephalosporin use.  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    Current Facility-Administered Medications  Medication Dose Route Frequency Provider Last Rate Last Dose  . acetaminophen (TYLENOL) tablet 650 mg  650 mg Oral Q6H PRN Saundra Shelling, MD       Or  . acetaminophen (TYLENOL) suppository 650 mg  650 mg Rectal Q6H PRN Pyreddy, Reatha Harps, MD      . acetaminophen (TYLENOL) suppository 975 mg  975 mg Rectal Once Paulette Blanch, MD      . atorvastatin (LIPITOR) tablet 20 mg  20 mg Oral q1800 Pyreddy, Pavan, MD      . DOPamine (INTROPIN) 800 mg in dextrose 5 % 250  mL (3.2 mg/mL) infusion  0-20 mcg/kg/min Intravenous Titrated Kolluru, Sarath, MD   Stopped at 09/24/16 2227  . heparin ADULT infusion 100 units/mL (25000 units/257mL sodium chloride 0.45%)  900 Units/hr Intravenous Continuous Charlett Nose, RPH 9 mL/hr at 09/25/16 0915 900 Units/hr at 09/25/16 0915  . insulin aspart (novoLOG) injection 0-5 Units  0-5 Units Subcutaneous QHS Kasa, Kurian, MD      . insulin aspart (novoLOG) injection 0-9 Units  0-9 Units Subcutaneous TID WC Flora Lipps, MD   2 Units at 09/25/16 0742  . ipratropium-albuterol (DUONEB) 0.5-2.5 (3) MG/3ML nebulizer solution 3 mL  3 mL Nebulization Q6H Laverle Hobby, MD   3  mL at 09/25/16 0340  . lamoTRIgine (LAMICTAL) tablet 100 mg  100 mg Oral QHS Saundra Shelling, MD   100 mg at 09/24/16 2220  . levETIRAcetam (KEPPRA) tablet 500 mg  500 mg Oral Daily Pyreddy, Pavan, MD   500 mg at 09/24/16 1210  . meropenem (MERREM) IVPB SOLR 500 mg  500 mg Intravenous Q24H Fritzi Mandes, MD      . midodrine (PROAMATINE) tablet 5 mg  5 mg Oral TID WC Saundra Shelling, MD   5 mg at 09/25/16 0742  . ondansetron (ZOFRAN) tablet 4 mg  4 mg Oral Q6H PRN Pyreddy, Reatha Harps, MD       Or  . ondansetron (ZOFRAN) injection 4 mg  4 mg Intravenous Q6H PRN Pyreddy, Pavan, MD      . pantoprazole (PROTONIX) EC tablet 40 mg  40 mg Oral Daily Pyreddy, Pavan, MD      . senna-docusate (Senokot-S) tablet 1 tablet  1 tablet Oral QHS PRN Saundra Shelling, MD      . sevelamer carbonate (RENVELA) packet 0.8 g  0.8 g Oral TID WC Pyreddy, Reatha Harps, MD         Review of Systems A Multi-point review of systems was asked and was negative except for the findings documented in the history of present illness  Physical Exam Blood pressure (!) 92/29, pulse 72, temperature 97.6 F (36.4 C), temperature source Oral, resp. rate 14, height 5\' 6"  (1.676 m), weight 80.7 kg (177 lb 14.6 oz), SpO2 100 %. CONSTITUTIONAL: Resting in bed in no acute distress. EYES: Pupils are equal, round, and reactive to light, Sclera are non-icteric. EARS, NOSE, MOUTH AND THROAT: The oropharynx is clear. The oral mucosa is pink and moist. Hearing is intact to voice. LYMPH NODES:  Lymph nodes in the neck are normal. RESPIRATORY:  Lungs are coarse bilaterally. There is normal respiratory effort, with equal breath sounds bilaterally, and without pathologic use of accessory muscles. CARDIOVASCULAR: Heart is regular without murmurs, gallops, or rubs. GI: The abdomen is soft, nontender, and nondistended. There are no palpable masses. There is no hepatosplenomegaly. There are normal bowel sounds in all quadrants. GU: Rectal deferred.   MUSCULOSKELETAL:  Normal muscle strength and tone. No cyanosis or edema.   SKIN: Turgor is good and there are no pathologic skin lesions or ulcers. NEUROLOGIC: Motor and sensation is grossly normal. Cranial nerves are grossly intact. PSYCH:  Oriented to person, place and time. Affect is normal.  Data Reviewed Images and labs reviewed. Labs today show a leukocytosis of 14.8, numerous electrolyte LFT abdomen abnormalities. Current lipase is nearly 1200, current amylases 1600. Ultrasound from yesterday also reviewed which shows gallbladder wall thickening, pericholecystic fluid, multiple gallstones. I have personally reviewed the patient's imaging, laboratory findings and medical records.    Assessment    Pancreatitis  Plan    83 year old male with multiple medical problems and pancreatitis. Gallstone pancreatitis is within the differential. Unsure if lack of symptoms is secondary to mental status versus other medical problems at this time. However, patient is a very poor operative candidate. In this setting should he develop signs and symptoms of cholecystitis that did not improve with medical therapy a percutaneous cholecystostomy tube would be indicated. It is highly unlikely that he will ever be an elective surgical candidate. The Gen. surgery team will follow along with you to ensure complete resolution of his pancreatitis. No current plans for any operative intervention.     Time spent with the patient was 80 minutes, with more than 50% of the time spent in face-to-face education, counseling and care coordination.     Clayburn Pert, MD FACS General Surgeon 09/25/2016, 10:34 AM

## 2016-09-25 NOTE — Progress Notes (Signed)
Patient transferred from CCU.  VSS, no complaints of pain.  Family at bedside.  Assessment non remarkable.  Patient stating he is hungry.  Ordered dinner tray.

## 2016-09-25 NOTE — Progress Notes (Signed)
PHARMACY - PHYSICIAN COMMUNICATION CRITICAL VALUE ALERT - BLOOD CULTURE IDENTIFICATION (BCID)  Results for orders placed or performed during the hospital encounter of 09/24/16  Blood Culture ID Panel (Reflexed) (Collected: 09/24/2016  6:32 AM)  Result Value Ref Range   Enterococcus species NOT DETECTED NOT DETECTED   Listeria monocytogenes NOT DETECTED NOT DETECTED   Staphylococcus species NOT DETECTED NOT DETECTED   Staphylococcus aureus NOT DETECTED NOT DETECTED   Streptococcus species NOT DETECTED NOT DETECTED   Streptococcus agalactiae NOT DETECTED NOT DETECTED   Streptococcus pneumoniae NOT DETECTED NOT DETECTED   Streptococcus pyogenes NOT DETECTED NOT DETECTED   Acinetobacter baumannii NOT DETECTED NOT DETECTED   Enterobacteriaceae species DETECTED (A) NOT DETECTED   Enterobacter cloacae complex NOT DETECTED NOT DETECTED   Escherichia coli DETECTED (A) NOT DETECTED   Klebsiella oxytoca NOT DETECTED NOT DETECTED   Klebsiella pneumoniae NOT DETECTED NOT DETECTED   Proteus species NOT DETECTED NOT DETECTED   Serratia marcescens NOT DETECTED NOT DETECTED   Carbapenem resistance NOT DETECTED NOT DETECTED   Haemophilus influenzae NOT DETECTED NOT DETECTED   Neisseria meningitidis NOT DETECTED NOT DETECTED   Pseudomonas aeruginosa NOT DETECTED NOT DETECTED   Candida albicans NOT DETECTED NOT DETECTED   Candida glabrata NOT DETECTED NOT DETECTED   Candida krusei NOT DETECTED NOT DETECTED   Candida parapsilosis NOT DETECTED NOT DETECTED   Candida tropicalis NOT DETECTED NOT DETECTED    Name of physician (or Provider) Contacted: Ramachandran  Changes to prescribed antibiotics required: D/C vancomycin; continue meropenem.   Simpson,Michael L 09/25/2016  11:56 AM

## 2016-09-25 NOTE — Progress Notes (Signed)
[facility]  ROUNDING NOTE   Subjective:   Seen and examined on hemodialysis. Tolerating treatment well.     HEMODIALYSIS FLOWSHEET:  Blood Flow Rate (mL/min): 300 mL/min Arterial Pressure (mmHg): -100 mmHg Venous Pressure (mmHg): 230 mmHg Transmembrane Pressure (mmHg): 60 mmHg Ultrafiltration Rate (mL/min): 0 mL/min Dialysate Flow Rate (mL/min): 600 ml/min Conductivity: Machine : 14.2 Conductivity: Machine : 14.2 Dialysis Fluid Bolus: Normal Saline Bolus Amount (mL): 100 mL Dialysate Change: Other (comment) (3K)    Objective:  Vital signs in last 24 hours:  Temp:  [97.6 F (36.4 C)-98.7 F (37.1 C)] 97.6 F (36.4 C) (06/13 0920) Pulse Rate:  [72-115] 86 (06/13 1200) Resp:  [14-32] 19 (06/13 1200) BP: (72-126)/(29-75) 100/38 (06/13 1200) SpO2:  [87 %-100 %] 98 % (06/13 1200) Weight:  [80.7 kg (177 lb 14.6 oz)] 80.7 kg (177 lb 14.6 oz) (06/13 0920)  Weight change:  Filed Weights   09/24/16 0449 09/25/16 0920  Weight: 76.2 kg (168 lb) 80.7 kg (177 lb 14.6 oz)    Intake/Output: I/O last 3 completed shifts: In: 1896.1 [I.V.:1396.1; IV Piggyback:500] Out: 0    Intake/Output this shift:  Total I/O In: 15.8 [I.V.:15.8] Out: -   Physical Exam: General: Ill appearing  Head: nonrebreather  Eyes: Anicteric, PERRL  Neck: Supple, trachea midline  Lungs:  Clear to auscultation  Heart: Regular rate and rhythm  Abdomen:  Soft, nontender,   Extremities: no peripheral edema.  Neurologic: Nonfocal, moving all four extremities  Skin: No lesions  Access: Left AVF    Basic Metabolic Panel:  Recent Labs Lab 09/24/16 0638 09/25/16 0452 09/25/16 0959  NA 142 140  --   K 4.2 5.0  --   CL 95* 100*  --   CO2 31 27  --   GLUCOSE 206* 191*  --   BUN 48* 70*  --   CREATININE 5.73* 6.96*  --   CALCIUM 9.4 8.7*  --   MG  --   --  1.8    Liver Function Tests:  Recent Labs Lab 09/24/16 0638 09/25/16 0452  AST 243* 139*  ALT 198* 156*  ALKPHOS  258* 212*  BILITOT 2.5* 1.6*  PROT 8.3* 7.8  ALBUMIN 3.7 3.4*    Recent Labs Lab 09/24/16 0638 09/25/16 0452  LIPASE 2,491* 1,189*  AMYLASE  --  1,645*   No results for input(s): AMMONIA in the last 168 hours.  CBC:  Recent Labs Lab 09/24/16 0638 09/25/16 0452  WBC 21.1* 14.8*  NEUTROABS 19.9*  --   HGB 11.0* 11.1*  HCT 33.8* 34.3*  MCV 93.1 95.7  PLT 196 193    Cardiac Enzymes:  Recent Labs Lab 09/24/16 1110 09/24/16 1636 09/24/16 2259 09/25/16 0452 09/25/16 0959  TROPONINI 0.18* 0.43* 0.77* 1.45* 1.21*    BNP: Invalid input(s): POCBNP  CBG:  Recent Labs Lab 09/24/16 1223 09/24/16 1628 09/24/16 2211 09/25/16 0730 09/25/16 1159  GLUCAP 219* 206* 189* 174* 106*    Microbiology: Results for orders placed or performed during the hospital encounter of 09/24/16  Blood Culture (routine x 2)     Status: None (Preliminary result)   Collection Time: 09/24/16  6:20 AM  Result Value Ref Range Status   Specimen Description BLOOD ARTERIAL DRAW FROM R ARM  Final   Special Requests BOTTLES DRAWN AEROBIC AND ANAEROBIC BCAV  Final   Culture NO GROWTH 1 DAY  Final   Report Status PENDING  Incomplete  Blood Culture (routine x 2)  Status: None (Preliminary result)   Collection Time: 09/24/16  6:32 AM  Result Value Ref Range Status   Specimen Description BLOOD R HAND  Final   Special Requests   Final    BOTTLES DRAWN AEROBIC AND ANAEROBIC Blood Culture adequate volume   Culture  Setup Time   Final    Organism ID to follow GRAM NEGATIVE RODS AEROBIC BOTTLE ONLY CRITICAL RESULT CALLED TO, READ BACK BY AND VERIFIED WITH: HANK ZOMPA ON 09/25/16 AT 0944 QSD    Culture GRAM NEGATIVE RODS  Final   Report Status PENDING  Incomplete  Blood Culture ID Panel (Reflexed)     Status: Abnormal   Collection Time: 09/24/16  6:32 AM  Result Value Ref Range Status   Enterococcus species NOT DETECTED NOT DETECTED Final   Listeria monocytogenes NOT DETECTED NOT DETECTED  Final   Staphylococcus species NOT DETECTED NOT DETECTED Final   Staphylococcus aureus NOT DETECTED NOT DETECTED Final   Streptococcus species NOT DETECTED NOT DETECTED Final   Streptococcus agalactiae NOT DETECTED NOT DETECTED Final   Streptococcus pneumoniae NOT DETECTED NOT DETECTED Final   Streptococcus pyogenes NOT DETECTED NOT DETECTED Final   Acinetobacter baumannii NOT DETECTED NOT DETECTED Final   Enterobacteriaceae species DETECTED (A) NOT DETECTED Final    Comment: Enterobacteriaceae represent a large family of gram-negative bacteria, not a single organism. CRITICAL RESULT CALLED TO, READ BACK BY AND VERIFIED WITH: HANK ZOMPA ON 09/25/16 AT 0944 QSD    Enterobacter cloacae complex NOT DETECTED NOT DETECTED Final   Escherichia coli DETECTED (A) NOT DETECTED Final    Comment: CRITICAL RESULT CALLED TO, READ BACK BY AND VERIFIED WITH: HANK ZOMPA ON 09/25/16 AT 0944 QSD    Klebsiella oxytoca NOT DETECTED NOT DETECTED Final   Klebsiella pneumoniae NOT DETECTED NOT DETECTED Final   Proteus species NOT DETECTED NOT DETECTED Final   Serratia marcescens NOT DETECTED NOT DETECTED Final   Carbapenem resistance NOT DETECTED NOT DETECTED Final   Haemophilus influenzae NOT DETECTED NOT DETECTED Final   Neisseria meningitidis NOT DETECTED NOT DETECTED Final   Pseudomonas aeruginosa NOT DETECTED NOT DETECTED Final   Candida albicans NOT DETECTED NOT DETECTED Final   Candida glabrata NOT DETECTED NOT DETECTED Final   Candida krusei NOT DETECTED NOT DETECTED Final   Candida parapsilosis NOT DETECTED NOT DETECTED Final   Candida tropicalis NOT DETECTED NOT DETECTED Final  MRSA PCR Screening     Status: None   Collection Time: 09/24/16  9:00 PM  Result Value Ref Range Status   MRSA by PCR NEGATIVE NEGATIVE Final    Comment:        The GeneXpert MRSA Assay (FDA approved for NASAL specimens only), is one component of a comprehensive MRSA colonization surveillance program. It is  not intended to diagnose MRSA infection nor to guide or monitor treatment for MRSA infections.     Coagulation Studies:  Recent Labs  09/25/16 0452  LABPROT 14.9  INR 1.16    Urinalysis: No results for input(s): COLORURINE, LABSPEC, PHURINE, GLUCOSEU, HGBUR, BILIRUBINUR, KETONESUR, PROTEINUR, UROBILINOGEN, NITRITE, LEUKOCYTESUR in the last 72 hours.  Invalid input(s): APPERANCEUR    Imaging: Dg Chest Port 1 View  Result Date: 09/24/2016 CLINICAL DATA:  Vomiting, fever. History of end-stage renal disease on dialysis. EXAM: PORTABLE CHEST 1 VIEW COMPARISON:  Chest radiograph April 12, 2016 FINDINGS: Elevated LEFT hemidiaphragm with rounded lucency. Mild bronchitic changes and pulmonary vascular congestion. No pleural effusion. Cardiac silhouette is mildly enlarged and unchanged. Calcified aortic  knob. Soft tissue planes and included osseous structures are nonsuspicious. IMPRESSION: LEFT lung base atelectasis, less likely cavitating infiltrate. Recommend follow-up PA and lateral views of the chest when clinically able. Mild cardiomegaly and pulmonary vascular congestion. Similar bronchitic changes. Electronically Signed   By: Elon Alas M.D.   On: 09/24/2016 05:38   US Abdomen Limited Ruq  Result Date: 09/24/2016 CLINICAL DATA:  83 year old male with hepatitis.  Initial encounter. EXAM: ULTRASOUND ABDOMEN LIMITED RIGHT UPPER QUADRANT COMPARISON:  03/16/2013 CT. FINDINGS: Gallbladder: Multiple mobile gallstones. Gallbladder wall thickening measuring up to 5.5 mm. Tiny amount of pericholecystic fluid. Patient was not tender over this region during scanning per ultrasound technologist. Common bile duct: Diameter: 4.2 mm. Liver: No dominant mass. Portal triads slightly echogenic. Tiny liver calcification. Echogenic right kidney. IMPRESSION: Gallstones. Gallbladder wall thickening and tiny amount of pericholecystic fluid may be related to hepatitis as patient was not tender over this  region during scanning per ultrasound technologist. Cholecystitis could not be excluded in proper clinical setting. Electronically Signed   By: Genia Del M.D.   On: 09/24/2016 14:04     Medications:   . DOPamine Stopped (09/24/16 2227)  . heparin 900 Units/hr (09/25/16 1100)  . meropenem     . atorvastatin  20 mg Oral q1800  . insulin aspart  0-5 Units Subcutaneous QHS  . insulin aspart  0-9 Units Subcutaneous TID WC  . ipratropium-albuterol  3 mL Nebulization Q6H  . lamoTRIgine  100 mg Oral QHS  . levETIRAcetam  500 mg Oral BID  . midodrine  5 mg Oral TID WC  . pantoprazole sodium  40 mg Oral Daily  . sevelamer carbonate  0.8 g Oral TID WC   acetaminophen **OR** acetaminophen, ondansetron **OR** ondansetron (ZOFRAN) IV, senna-docusate  Assessment/ Plan:  [name] is a 83 y.o. black male with end-stage renal disease on hemodialysis, hypertension, diabetes type 2, insulin-dependent, seizure disorder, peripheral neuropathy, vascular dementia  MWF [facility] [place] Left arm AVF  1. End Stage Renal Disease: MWF. Seen and examined on hemodialysis. Tolerating treatment well.  Continue MWF schedule.   2. Sepsis with fever and pneumonia and CXR. Off vasopressors.  - meropenem ordered.  - vanco empiric  3. Anemia of chronic kidney disease: hemoglobin 11.1 Mircera as outpatient.  .   4. Secondary Hyperparathyroidism:   - sevelamer    LOS: 1 Salmaan Patchin 6/13/201812:05 PM

## 2016-09-25 NOTE — Progress Notes (Signed)
[place] for heparin drip management  Indication: chest pain/ACS   Pharmacy consulted for heparin drip management for 83 yo male ICU patient admitted with pneumonia, bacteremia, pancreatitis. Patient with elevated troponins, cardiology trending. Patient last received heparin 5000 units SQ at 0602. Patient received HD on Monday/Wednesday/Friday. Receiving dialysis today.   Goal of Therapy:  Heparin level 0.3-0.7 units/ml Monitor platelets by anticoagulation protocol: Yes  Plan:  Patient received 4000 units bolus at 915 and was started on heparin drip at 900 units/hr. Will check anti-Xa level at 1715 (8 hours after start of infusion). Anti-Xa level may be elevated as patient received bolus after SQ dose.     Allergies  Allergen Reactions  . Aspirin   . Bc Powder [Aspirin-Salicylamide-Caffeine]   . Penicillins Other (See Comments)    Has patient had a PCN reaction causing immediate rash, facial/tongue/throat swelling, SOB or lightheadedness with hypotension: Unknown Has patient had a PCN reaction causing severe rash involving mucus membranes or skin necrosis: Unknown Has patient had a PCN reaction that required hospitalization: Unknown Has patient had a PCN reaction occurring within the last 10 years: Unknown If all of the above answers are "NO", then may proceed with Cephalosporin use.  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    Patient Measurements: Height: 5\' 6"  (167.6 cm) Weight: 177 lb 14.6 oz (80.7 kg) IBW/kg (Calculated) : 63.8   Vital Signs: Temp: 97.6 F (36.4 C) (06/13 0920) Temp Source: Oral (06/13 0920) BP: 90/44 (06/13 1145) Pulse Rate: 84 (06/13 1151)  Labs:  Recent Labs  09/24/16 8088  09/24/16 2259 09/25/16 0452 09/25/16 0959  HGB 11.0*  --   --  11.1*  --   HCT 33.8*  --   --  34.3*  --   PLT 196  --   --  193  --   APTT  --   --   --  35  --   LABPROT  --   --   --  14.9  --   INR  --   --   --  1.16  --   CREATININE 5.73*  --    --  6.96*  --   TROPONINI 0.07*  < > 0.77* 1.45* 1.21*  < > = values in this interval not displayed.  Estimated Creatinine Clearance: 7.7 mL/min (A) (by C-G formula based on SCr of 6.96 mg/dL (H)).   Medical History: Past Medical History:  Diagnosis Date  . Anemia of chronic disease   . Chronic pain disorder   . Dementia   . Depression   . ESRD on hemodialysis ([place])    a. MWF  . Falls frequently   . GERD (gastroesophageal reflux disease)   . Hypertension   . Insulin dependent diabetes mellitus with complications ([place])   . Mood disorder ([place])   . Peripheral neuropathy    a. diabetic neuropathy  . Peripheral vascular disease ([place])    a. s/p prior toe amputation; b. followed by [facility]  . Prostate cancer ([place])   . Seizures ([place])    EPILEPSY  . Stroke [facility])    TIA  . Weakness generalized    Pharmacy will continue to monitor and adjust per consult.   Keats Kingry L 09/25/2016,11:59 AM

## 2016-09-25 NOTE — Consult Note (Signed)
[facility] Infectious Disease     Reason for Consult:Bacteremia   Referring Physician: Delfino Lovett Date of Admission:  09/24/2016   Principal Problem:   Septic shock (HCC) Active Problems:   CAP (community acquired pneumonia)   Cholecystitis   Elevated troponin   ESRD on hemodialysis (HCC)   Dementia   PVD (peripheral vascular disease) (HCC)   IDDM (insulin dependent diabetes mellitus) (HCC)   Bacteremia   HPI: Michael Acosta is a 83 y.o. male with ESRD admitted with fever to 104 and nausea vomiting. WBC 21 K. Started vanco and meropenem. BCX +  E coli.  Found to have lipase 2491, elevated ast alt.  Possible gallstone pancreatitis.  Currently in ICU with family at bedside. He denies abd pain, no further NV.   Past Medical History:  Diagnosis Date  . Anemia of chronic disease   . Chronic pain disorder   . Dementia   . Depression   . ESRD on hemodialysis (HCC)    a. MWF  . Falls frequently   . GERD (gastroesophageal reflux disease)   . Hypertension   . Insulin dependent diabetes mellitus with complications (HCC)   . Mood disorder (HCC)   . Peripheral neuropathy    a. diabetic neuropathy  . Peripheral vascular disease (HCC)    a. s/p prior toe amputation; b. [facility] by [facility]  . Prostate cancer (HCC)   . Seizures (HCC)    EPILEPSY  . Stroke [facility])    TIA  . Weakness generalized    Past Surgical History:  Procedure Laterality Date  . COLON SURGERY     RESECTION  . INSERTION OF DIALYSIS CATHETER     SHUNT  . LEG SURGERY Left    MULTIPLE VEIN SURGERIES  . TOTAL HIP ARTHROPLASTY Left    Social History  Substance Use Topics  . Smoking status: Never Smoker  . Smokeless tobacco: Never Used  . Alcohol use No   Family History  Problem Relation Age of Onset  . CAD Neg Hx   . Diabetes Neg Hx     Allergies:  Allergies  Allergen Reactions  . Aspirin   . Bc Powder [Aspirin-Salicylamide-Caffeine]   . Penicillins Other (See Comments)    Has patient had a PCN  reaction causing immediate rash, facial/tongue/throat swelling, SOB or lightheadedness with hypotension: Unknown Has patient had a PCN reaction causing severe rash involving mucus membranes or skin necrosis: Unknown Has patient had a PCN reaction that required hospitalization: Unknown Has patient had a PCN reaction occurring within the last 10 years: Unknown If all of the above answers are "NO", then may proceed with Cephalosporin use.  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    Current antibiotics: Antibiotics Given (last 72 hours)    Date/Time Action Medication Dose Rate   09/24/16 0745 New Bag/Given   aztreonam (AZACTAM) 2 g in dextrose 5 % 50 mL IVPB 2 g 100 mL/hr   09/24/16 1128 New Bag/Given   vancomycin (VANCOCIN) 1,750 mg in sodium chloride 0.9 % 500 mL IVPB 1,750 mg 250 mL/hr   09/25/16 0915 New Bag/Given   meropenem (MERREM) IVPB SOLR 500 mg 500 mg 100 mL/hr      MEDICATIONS: . atorvastatin  20 mg Oral q1800  . insulin aspart  0-5 Units Subcutaneous QHS  . insulin aspart  0-9 Units Subcutaneous TID WC  . ipratropium-albuterol  3 mL Nebulization Q6H  . lamoTRIgine  100 mg Oral QHS  . levETIRAcetam  500 mg Oral BID  . midodrine  5 mg Oral TID WC  . pantoprazole sodium  40 mg Oral Daily  . sevelamer carbonate  0.8 g Oral TID WC    Review of Systems - 11 systems reviewed and negative per HPI   OBJECTIVE: Temp:  [97.6 F (36.4 C)-98.7 F (37.1 C)] 97.9 F (36.6 C) (06/13 1230) Pulse Rate:  [72-115] 95 (06/13 1400) Resp:  [14-30] 15 (06/13 1400) BP: (77-129)/(29-75) 94/62 (06/13 1400) SpO2:  [93 %-100 %] 96 % (06/13 1400) Weight:  [80.7 kg (177 lb 14.6 oz)] 80.7 kg (177 lb 14.6 oz) (06/13 1230) Physical Exam  Constitutional: He is oriented to person, place, and time. thin HENT: anicteric Mouth/Throat: Oropharynx is clear and moist. No oropharyngeal exudate.  Cardiovascular: Normal rate, regular rhythm and normal heart sounds.  Pulmonary/Chest: Effort normal and breath sounds normal. No  respiratory distress. He has no wheezes.  Abdominal: Soft. Bowel sounds are normal. He exhibits no distension. There is no tenderness.  Lymphadenopathy: He has no cervical adenopathy.  Neurological: He is alert and oriented to person, place, and time.  Skin: Skin is warm and dry. No rash noted. No erythema.  EXT HD AVF LUE WNL Psychiatric: He has a normal mood and affect. His behavior is normal.    LABS: Results for orders placed or performed during the hospital encounter of 09/24/16 (from the past 48 hour(s))  Lactic acid, plasma     Status: Abnormal   Collection Time: 09/24/16  5:05 AM  Result Value Ref Range   Lactic Acid, Venous 3.2 (HH) 0.5 - 1.9 mmol/L    Comment: CRITICAL RESULT CALLED TO, READ BACK BY AND VERIFIED WITH KALA KEEN ON 09/24/16 AT 0600 QSD   Blood gas, arterial ([facility], [facility], [facility])     Status: Abnormal   Collection Time: 09/24/16  5:05 AM  Result Value Ref Range   FIO2 1.00    Delivery systems NON-REBREATHER OXYGEN MASK    pH, Arterial 7.48 (H) 7.350 - 7.450   pCO2 arterial 41 32.0 - 48.0 mmHg   pO2, Arterial 81 (L) 83.0 - 108.0 mmHg   Bicarbonate 30.5 (H) 20.0 - 28.0 mmol/L   Acid-Base Excess 6.4 (H) 0.0 - 2.0 mmol/L   O2 Saturation 96.7 %   Patient temperature 37.0    Collection site REVIEWED BY    Sample type ARTERIAL DRAW    Allens test (pass/fail) PASS PASS  Blood Culture (routine x 2)     Status: None (Preliminary result)   Collection Time: 09/24/16  6:20 AM  Result Value Ref Range   Specimen Description BLOOD ARTERIAL DRAW FROM R ARM    Special Requests BOTTLES DRAWN AEROBIC AND ANAEROBIC BCAV    Culture NO GROWTH 1 DAY    Report Status PENDING   Blood Culture (routine x 2)     Status: None (Preliminary result)   Collection Time: 09/24/16  6:32 AM  Result Value Ref Range   Specimen Description BLOOD R HAND    Special Requests      BOTTLES DRAWN AEROBIC AND ANAEROBIC Blood Culture adequate volume   Culture  Setup Time      Organism ID to follow GRAM  NEGATIVE RODS AEROBIC BOTTLE ONLY CRITICAL RESULT CALLED TO, READ BACK BY AND VERIFIED WITH: HANK ZOMPA ON 09/25/16 AT 0944 QSD    Culture GRAM NEGATIVE RODS    Report Status PENDING   Blood Culture ID Panel (Reflexed)     Status: Abnormal   Collection Time: 09/24/16  6:32 AM  Result  Value Ref Range   Enterococcus species NOT DETECTED NOT DETECTED   Listeria monocytogenes NOT DETECTED NOT DETECTED   Staphylococcus species NOT DETECTED NOT DETECTED   Staphylococcus aureus NOT DETECTED NOT DETECTED   Streptococcus species NOT DETECTED NOT DETECTED   Streptococcus agalactiae NOT DETECTED NOT DETECTED   Streptococcus pneumoniae NOT DETECTED NOT DETECTED   Streptococcus pyogenes NOT DETECTED NOT DETECTED   Acinetobacter baumannii NOT DETECTED NOT DETECTED   Enterobacteriaceae species DETECTED (A) NOT DETECTED    Comment: Enterobacteriaceae represent a large family of gram-negative bacteria, not a single organism. CRITICAL RESULT CALLED TO, READ BACK BY AND VERIFIED WITH: HANK ZOMPA ON 09/25/16 AT 0944 QSD    Enterobacter cloacae complex NOT DETECTED NOT DETECTED   Escherichia coli DETECTED (A) NOT DETECTED    Comment: CRITICAL RESULT CALLED TO, READ BACK BY AND VERIFIED WITH: HANK ZOMPA ON 09/25/16 AT 0944 QSD    Klebsiella oxytoca NOT DETECTED NOT DETECTED   Klebsiella pneumoniae NOT DETECTED NOT DETECTED   Proteus species NOT DETECTED NOT DETECTED   Serratia marcescens NOT DETECTED NOT DETECTED   Carbapenem resistance NOT DETECTED NOT DETECTED   Haemophilus influenzae NOT DETECTED NOT DETECTED   Neisseria meningitidis NOT DETECTED NOT DETECTED   Pseudomonas aeruginosa NOT DETECTED NOT DETECTED   Candida albicans NOT DETECTED NOT DETECTED   Candida glabrata NOT DETECTED NOT DETECTED   Candida krusei NOT DETECTED NOT DETECTED   Candida parapsilosis NOT DETECTED NOT DETECTED   Candida tropicalis NOT DETECTED NOT DETECTED  Comprehensive metabolic panel     Status: Abnormal    Collection Time: 09/24/16  6:38 AM  Result Value Ref Range   Sodium 142 135 - 145 mmol/L   Potassium 4.2 3.5 - 5.1 mmol/L   Chloride 95 (L) 101 - 111 mmol/L   CO2 31 22 - 32 mmol/L   Glucose, Bld 206 (H) 65 - 99 mg/dL   BUN 48 (H) 6 - 20 mg/dL   Creatinine, Ser 5.73 (H) 0.61 - 1.24 mg/dL   Calcium 9.4 8.9 - 10.3 mg/dL   Total Protein 8.3 (H) 6.5 - 8.1 g/dL   Albumin 3.7 3.5 - 5.0 g/dL   AST 243 (H) 15 - 41 U/L   ALT 198 (H) 17 - 63 U/L   Alkaline Phosphatase 258 (H) 38 - 126 U/L   Total Bilirubin 2.5 (H) 0.3 - 1.2 mg/dL   GFR calc non Af Amer 8 (L) >60 mL/min   GFR calc Af Amer 9 (L) >60 mL/min    Comment: (NOTE) The eGFR has been calculated using the CKD EPI equation. This calculation has not been validated in all clinical situations. eGFR's persistently <60 mL/min signify possible Chronic Kidney Disease.    Anion gap 16 (H) 5 - 15  CBC WITH DIFFERENTIAL     Status: Abnormal   Collection Time: 09/24/16  6:38 AM  Result Value Ref Range   WBC 21.1 (H) 3.8 - 10.6 K/uL   RBC 3.64 (L) 4.40 - 5.90 MIL/uL   Hemoglobin 11.0 (L) 13.0 - 18.0 g/dL   HCT 33.8 (L) 40.0 - 52.0 %   MCV 93.1 80.0 - 100.0 fL   MCH 30.3 26.0 - 34.0 pg   MCHC 32.6 32.0 - 36.0 g/dL   RDW 16.0 (H) 11.5 - 14.5 %   Platelets 196 150 - 440 K/uL   Neutrophils Relative % 94 %   Lymphocytes Relative 2 %   Monocytes Relative 4 %   Eosinophils Relative 0 %  Basophils Relative 0 %   Band Neutrophils 0 %   Metamyelocytes Relative 0 %   Myelocytes 0 %   Promyelocytes Absolute 0 %   Blasts 0 %   nRBC 0 0 /100 WBC   Other 0 %   Neutro Abs 19.9 (H) 1.4 - 6.5 K/uL   Lymphs Abs 0.4 (L) 1.0 - 3.6 K/uL   Monocytes Absolute 0.8 0.2 - 1.0 K/uL   Eosinophils Absolute 0.0 0 - 0.7 K/uL   Basophils Absolute 0.0 0 - 0.1 K/uL  Lipase, blood     Status: Abnormal   Collection Time: 09/24/16  6:38 AM  Result Value Ref Range   Lipase 2,491 (H) 11 - 51 U/L    Comment: RESULT CONFIRMED BY MANUAL DILUTION ALV  Troponin I      Status: Abnormal   Collection Time: 09/24/16  6:38 AM  Result Value Ref Range   Troponin I 0.07 (HH) <0.03 ng/mL    Comment: CRITICAL RESULT CALLED TO, READ BACK BY AND VERIFIED WITH SAMANTHA HAMILTON AT 4709 ON 09/24/16 ALV   Hepatitis panel, acute     Status: None   Collection Time: 09/24/16  6:38 AM  Result Value Ref Range   Hepatitis B Surface Ag Negative Negative   HCV Ab <0.1 0.0 - 0.9 s/co ratio    Comment: (NOTE)                                  Negative:     < 0.8                             Indeterminate: 0.8 - 0.9                                  Positive:     > 0.9 The CDC recommends that a positive HCV antibody result be followed up with a HCV Nucleic Acid Amplification test ([id number]). Performed At: [facility] [place], [place] [id number] Lindon Romp MD [id number]    Hep A IgM Negative Negative   Hep B C IgM Negative Negative  Glucose, capillary     Status: Abnormal   Collection Time: 09/24/16  8:58 AM  Result Value Ref Range   Glucose-Capillary 207 (H) 65 - 99 mg/dL  Lactic acid, plasma     Status: Abnormal   Collection Time: 09/24/16  9:38 AM  Result Value Ref Range   Lactic Acid, Venous 2.1 (HH) 0.5 - 1.9 mmol/L    Comment: CRITICAL RESULT CALLED TO, READ BACK BY AND VERIFIED WITH MYRA FLOWERS AT 1023 ON 09/24/16 ALV   Lactic acid, plasma     Status: None   Collection Time: 09/24/16 11:10 AM  Result Value Ref Range   Lactic Acid, Venous 1.7 0.5 - 1.9 mmol/L  Troponin I     Status: Abnormal   Collection Time: 09/24/16 11:10 AM  Result Value Ref Range   Troponin I 0.18 (HH) <0.03 ng/mL    Comment: CRITICAL VALUE NOTED. VALUE IS CONSISTENT WITH PREVIOUSLY REPORTED/CALLED VALUE ALV  Glucose, capillary     Status: Abnormal   Collection Time: 09/24/16 12:23 PM  Result Value Ref Range   Glucose-Capillary 219 (H) 65 - 99 mg/dL  Glucose, capillary     Status: Abnormal   Collection  Time: 09/24/16  4:28 PM  Result Value Ref Range    Glucose-Capillary 206 (H) 65 - 99 mg/dL  Troponin I     Status: Abnormal   Collection Time: 09/24/16  4:36 PM  Result Value Ref Range   Troponin I 0.43 (HH) <0.03 ng/mL    Comment: CRITICAL VALUE NOTED. VALUE IS CONSISTENT WITH PREVIOUSLY REPORTED/CALLED VALUE JJB  MRSA PCR Screening     Status: None   Collection Time: 09/24/16  9:00 PM  Result Value Ref Range   MRSA by PCR NEGATIVE NEGATIVE    Comment:        The GeneXpert MRSA Assay (FDA approved for NASAL specimens only), is one component of a comprehensive MRSA colonization surveillance program. It is not intended to diagnose MRSA infection nor to guide or monitor treatment for MRSA infections.   Glucose, capillary     Status: Abnormal   Collection Time: 09/24/16 10:11 PM  Result Value Ref Range   Glucose-Capillary 189 (H) 65 - 99 mg/dL  Troponin I     Status: Abnormal   Collection Time: 09/24/16 10:59 PM  Result Value Ref Range   Troponin I 0.77 (HH) <0.03 ng/mL    Comment: CRITICAL VALUE NOTED. VALUE CONSISTENT WITH PREVIOUSLY REPORTED/CALLED VALUE...MSS CORRECTED ON 06/13 AT 0630: PREVIOUSLY REPORTED AS 0.77 CRITICAL RESULT CALLED TO, READ BACK BY AND VERIFIED WITH.MSS   CBC     Status: Abnormal   Collection Time: 09/25/16  4:52 AM  Result Value Ref Range   WBC 14.8 (H) 3.8 - 10.6 K/uL   RBC 3.58 (L) 4.40 - 5.90 MIL/uL   Hemoglobin 11.1 (L) 13.0 - 18.0 g/dL   HCT 34.3 (L) 40.0 - 52.0 %   MCV 95.7 80.0 - 100.0 fL   MCH 31.0 26.0 - 34.0 pg   MCHC 32.3 32.0 - 36.0 g/dL   RDW 15.8 (H) 11.5 - 14.5 %   Platelets 193 150 - 440 K/uL  Comprehensive metabolic panel     Status: Abnormal   Collection Time: 09/25/16  4:52 AM  Result Value Ref Range   Sodium 140 135 - 145 mmol/L   Potassium 5.0 3.5 - 5.1 mmol/L   Chloride 100 (L) 101 - 111 mmol/L   CO2 27 22 - 32 mmol/L   Glucose, Bld 191 (H) 65 - 99 mg/dL   BUN 70 (H) 6 - 20 mg/dL   Creatinine, Ser 6.96 (H) 0.61 - 1.24 mg/dL   Calcium 8.7 (L) 8.9 - 10.3 mg/dL   Total  Protein 7.8 6.5 - 8.1 g/dL   Albumin 3.4 (L) 3.5 - 5.0 g/dL   AST 139 (H) 15 - 41 U/L   ALT 156 (H) 17 - 63 U/L   Alkaline Phosphatase 212 (H) 38 - 126 U/L   Total Bilirubin 1.6 (H) 0.3 - 1.2 mg/dL   GFR calc non Af Amer 6 (L) >60 mL/min   GFR calc Af Amer 7 (L) >60 mL/min    Comment: (NOTE) The eGFR has been calculated using the CKD EPI equation. This calculation has not been validated in all clinical situations. eGFR's persistently <60 mL/min signify possible Chronic Kidney Disease.    Anion gap 13 5 - 15  Troponin I     Status: Abnormal   Collection Time: 09/25/16  4:52 AM  Result Value Ref Range   Troponin I 1.45 (HH) <0.03 ng/mL    Comment: CRITICAL VALUE NOTED. VALUE IS CONSISTENT WITH PREVIOUSLY REPORTED/CALLED VALUE...[place]  Lipase, blood  Status: Abnormal   Collection Time: 09/25/16  4:52 AM  Result Value Ref Range   Lipase 1,189 (H) 11 - 51 U/L    Comment: RESULT CONFIRMED BY MANUAL DILUTION  Amylase     Status: Abnormal   Collection Time: 09/25/16  4:52 AM  Result Value Ref Range   Amylase 1,645 (H) 28 - 100 U/L  Triglycerides     Status: None   Collection Time: 09/25/16  4:52 AM  Result Value Ref Range   Triglycerides 97 <150 mg/dL  APTT     Status: None   Collection Time: 09/25/16  4:52 AM  Result Value Ref Range   aPTT 35 24 - 36 seconds  Protime-INR     Status: None   Collection Time: 09/25/16  4:52 AM  Result Value Ref Range   Prothrombin Time 14.9 11.4 - 15.2 seconds   INR 1.16   Glucose, capillary     Status: Abnormal   Collection Time: 09/25/16  7:30 AM  Result Value Ref Range   Glucose-Capillary 174 (H) 65 - 99 mg/dL  Troponin I (q 6hr x 3)     Status: Abnormal   Collection Time: 09/25/16  9:59 AM  Result Value Ref Range   Troponin I 1.21 (HH) <0.03 ng/mL    Comment: CRITICAL VALUE NOTED. VALUE IS CONSISTENT WITH PREVIOUSLY REPORTED/CALLED VALUE ALV  Magnesium     Status: None   Collection Time: 09/25/16  9:59 AM  Result Value Ref Range    Magnesium 1.8 1.7 - 2.4 mg/dL  Glucose, capillary     Status: Abnormal   Collection Time: 09/25/16 11:59 AM  Result Value Ref Range   Glucose-Capillary 106 (H) 65 - 99 mg/dL   No components found for: ESR, C REACTIVE PROTEIN MICRO: Recent Results (from the past 720 hour(s))  Blood Culture (routine x 2)     Status: None (Preliminary result)   Collection Time: 09/24/16  6:20 AM  Result Value Ref Range Status   Specimen Description BLOOD ARTERIAL DRAW FROM R ARM  Final   Special Requests BOTTLES DRAWN AEROBIC AND ANAEROBIC BCAV  Final   Culture NO GROWTH 1 DAY  Final   Report Status PENDING  Incomplete  Blood Culture (routine x 2)     Status: None (Preliminary result)   Collection Time: 09/24/16  6:32 AM  Result Value Ref Range Status   Specimen Description BLOOD R HAND  Final   Special Requests   Final    BOTTLES DRAWN AEROBIC AND ANAEROBIC Blood Culture adequate volume   Culture  Setup Time   Final    Organism ID to follow GRAM NEGATIVE RODS AEROBIC BOTTLE ONLY CRITICAL RESULT CALLED TO, READ BACK BY AND VERIFIED WITH: HANK ZOMPA ON 09/25/16 AT 0944 QSD    Culture GRAM NEGATIVE RODS  Final   Report Status PENDING  Incomplete  Blood Culture ID Panel (Reflexed)     Status: Abnormal   Collection Time: 09/24/16  6:32 AM  Result Value Ref Range Status   Enterococcus species NOT DETECTED NOT DETECTED Final   Listeria monocytogenes NOT DETECTED NOT DETECTED Final   Staphylococcus species NOT DETECTED NOT DETECTED Final   Staphylococcus aureus NOT DETECTED NOT DETECTED Final   Streptococcus species NOT DETECTED NOT DETECTED Final   Streptococcus agalactiae NOT DETECTED NOT DETECTED Final   Streptococcus pneumoniae NOT DETECTED NOT DETECTED Final   Streptococcus pyogenes NOT DETECTED NOT DETECTED Final   Acinetobacter baumannii NOT DETECTED NOT DETECTED Final  Enterobacteriaceae species DETECTED (A) NOT DETECTED Final    Comment: Enterobacteriaceae represent a large family of  gram-negative bacteria, not a single organism. CRITICAL RESULT CALLED TO, READ BACK BY AND VERIFIED WITH: HANK ZOMPA ON 09/25/16 AT 0944 QSD    Enterobacter cloacae complex NOT DETECTED NOT DETECTED Final   Escherichia coli DETECTED (A) NOT DETECTED Final    Comment: CRITICAL RESULT CALLED TO, READ BACK BY AND VERIFIED WITH: HANK ZOMPA ON 09/25/16 AT 0944 QSD    Klebsiella oxytoca NOT DETECTED NOT DETECTED Final   Klebsiella pneumoniae NOT DETECTED NOT DETECTED Final   Proteus species NOT DETECTED NOT DETECTED Final   Serratia marcescens NOT DETECTED NOT DETECTED Final   Carbapenem resistance NOT DETECTED NOT DETECTED Final   Haemophilus influenzae NOT DETECTED NOT DETECTED Final   Neisseria meningitidis NOT DETECTED NOT DETECTED Final   Pseudomonas aeruginosa NOT DETECTED NOT DETECTED Final   Candida albicans NOT DETECTED NOT DETECTED Final   Candida glabrata NOT DETECTED NOT DETECTED Final   Candida krusei NOT DETECTED NOT DETECTED Final   Candida parapsilosis NOT DETECTED NOT DETECTED Final   Candida tropicalis NOT DETECTED NOT DETECTED Final  MRSA PCR Screening     Status: None   Collection Time: 09/24/16  9:00 PM  Result Value Ref Range Status   MRSA by PCR NEGATIVE NEGATIVE Final    Comment:        The GeneXpert MRSA Assay (FDA approved for NASAL specimens only), is one component of a comprehensive MRSA colonization surveillance program. It is not intended to diagnose MRSA infection nor to guide or monitor treatment for MRSA infections.     IMAGING: Dg Chest Port 1 View  Result Date: 09/24/2016 CLINICAL DATA:  Vomiting, fever. History of end-stage renal disease on dialysis. EXAM: PORTABLE CHEST 1 VIEW COMPARISON:  Chest radiograph April 12, 2016 FINDINGS: Elevated LEFT hemidiaphragm with rounded lucency. Mild bronchitic changes and pulmonary vascular congestion. No pleural effusion. Cardiac silhouette is mildly enlarged and unchanged. Calcified aortic knob. Soft  tissue planes and included osseous structures are nonsuspicious. IMPRESSION: LEFT lung base atelectasis, less likely cavitating infiltrate. Recommend follow-up PA and lateral views of the chest when clinically able. Mild cardiomegaly and pulmonary vascular congestion. Similar bronchitic changes. Electronically Signed   By: Elon Alas M.D.   On: 09/24/2016 05:38   US Abdomen Limited Ruq  Result Date: 09/24/2016 CLINICAL DATA:  83 year old male with hepatitis.  Initial encounter. EXAM: ULTRASOUND ABDOMEN LIMITED RIGHT UPPER QUADRANT COMPARISON:  03/16/2013 CT. FINDINGS: Gallbladder: Multiple mobile gallstones. Gallbladder wall thickening measuring up to 5.5 mm. Tiny amount of pericholecystic fluid. Patient was not tender over this region during scanning per ultrasound technologist. Common bile duct: Diameter: 4.2 mm. Liver: No dominant mass. Portal triads slightly echogenic. Tiny liver calcification. Echogenic right kidney. IMPRESSION: Gallstones. Gallbladder wall thickening and tiny amount of pericholecystic fluid may be related to hepatitis as patient was not tender over this region during scanning per ultrasound technologist. Cholecystitis could not be excluded in proper clinical setting. Electronically Signed   By: Genia Del M.D.   On: 09/24/2016 14:04    Assessment:   Michael Acosta is a 83 y.o. male with ESRD admitted with NV temp 104 and elevated lfts and lipase. USS shows gallstones.  He likely has gallstone pancreatitis as source of his E coli bacteremia.  Seen by surgery but is a poor surgical candidate.   Recommendations Cont meropenem pending sensitivities Monitor lfts and exam. Consider CT abd if worsens.  Thank you very much for allowing me to participate in the care of this patient. Please call with questions.   Rhythm Wigfall P. Reginald Weida, MD  

## 2016-09-25 NOTE — Progress Notes (Addendum)
[facility] for heparin drip management  Indication: chest pain/ACS   Pharmacy consulted for heparin drip management for 83 yo male ICU patient admitted with pneumonia, bacteremia, pancreatitis. Patient with elevated troponins, cardiology trending. Patient last received heparin 5000 units SQ at 0602. Patient received HD on Monday/Wednesday/Friday. Receiving dialysis today.   Goal of Therapy:  Heparin level 0.3-0.7 units/ml Monitor platelets by anticoagulation protocol: Yes  Plan:  6/13  1808 HL 0.42. 1st level therapeutic. Will order HL in 8 hours. CBC with AM labs per protocol.   6/14 0200 heparin level 0.25. 1200 unit bolus and increase rate to 1050 units/hr. Recheck in 8 hours.    Allergies  Allergen Reactions  . Aspirin   . Bc Powder [Aspirin-Salicylamide-Caffeine]   . Penicillins Other (See Comments)    Has patient had a PCN reaction causing immediate rash, facial/tongue/throat swelling, SOB or lightheadedness with hypotension: Unknown Has patient had a PCN reaction causing severe rash involving mucus membranes or skin necrosis: Unknown Has patient had a PCN reaction that required hospitalization: Unknown Has patient had a PCN reaction occurring within the last 10 years: Unknown If all of the above answers are "NO", then may proceed with Cephalosporin use.  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    Patient Measurements: Height: 5\' 6"  (167.6 cm) Weight: 177 lb 14.6 oz (80.7 kg) IBW/kg (Calculated) : 63.8   Vital Signs: Temp: 97.6 F (36.4 C) (06/13 1603) Temp Source: Oral (06/13 1603) BP: 110/46 (06/13 1603) Pulse Rate: 86 (06/13 1603)  Labs:  Recent Labs  09/24/16 4193  09/25/16 0452 09/25/16 0959 09/25/16 1808  HGB 11.0*  --  11.1*  --   --   HCT 33.8*  --  34.3*  --   --   PLT 196  --  193  --   --   APTT  --   --  35  --   --   LABPROT  --   --  14.9  --   --   INR  --   --  1.16  --   --   HEPARINUNFRC  --   --   --   --  0.42   CREATININE 5.73*  --  6.96*  --   --   TROPONINI 0.07*  < > 1.45* 1.21* 0.99*  < > = values in this interval not displayed.  Estimated Creatinine Clearance: 7.7 mL/min (A) (by C-G formula based on SCr of 6.96 mg/dL (H)).   Medical History: Past Medical History:  Diagnosis Date  . Anemia of chronic disease   . Chronic pain disorder   . Dementia   . Depression   . ESRD on hemodialysis ([place])    a. MWF  . Falls frequently   . GERD (gastroesophageal reflux disease)   . Hypertension   . Insulin dependent diabetes mellitus with complications ([place])   . Mood disorder ([place])   . Peripheral neuropathy    a. diabetic neuropathy  . Peripheral vascular disease ([place])    a. s/p prior toe amputation; b. followed by [facility]  . Prostate cancer ([place])   . Seizures ([place])    EPILEPSY  . Stroke [facility])    TIA  . Weakness generalized    Pharmacy will continue to monitor and adjust per consult.   Sheema M Hallaji 09/25/2016,7:37 PM

## 2016-09-25 NOTE — Progress Notes (Signed)
Dr. Juanell Fairly notified of troponin trending down. Troponin of 1.21

## 2016-09-25 NOTE — Consult Note (Signed)
Consultation Note Date: 09/25/2016   Patient Name: Michael Acosta  DOB: 06/25/1929  MRN: [id number]  Age / Sex: 83 y.o., male  PCP: Marden Noble, MD Referring Physician: Max Sane, MD  Reason for Consultation: Establishing goals of care and Psychosocial/spiritual support  HPI/Patient Profile: 83 y.o. male  admitted on 09/24/2016 after transfer from [facility] with vomiting and fever of 10 21F.    He has past medical h/o ESRD is on dialysis, mild dementia (per medical records), diabetes mellitus, GERD, CVA, hypertension, neuropathy.  Admitted and treatment for sepsis; multifactorial pneumonia/gallbladder disease/cholecystitis.  Suspected acute pancreatitis; elevated transaminases, abnormal ultrasound of the gallbladder.  Per surgery not a good surgical candidate.  Patient and family face advanced directive decisions and anticipatory care needs.  Clinical Assessment and Goals of Care:  This NP Wadie Lessen reviewed medical records, received report from team, assessed the patient and then meet at the patient's bedside along with his wife and daughter  to discuss diagnosis, prognosis, GOC, EOL wishes disposition and options.  A detailed discussion was had today regarding advanced directives.  Concepts specific to code status, artifical feeding and hydration, dialysis, continued IV antibiotics and rehospitalization was had.  The difference between a aggressive medical intervention path  and a palliative comfort care path for this patient at this time was had.  Values and goals of care important to patient and family were attempted to be elicited.  Concept of Hospice and Palliative Care were discussed  Natural trajectory and expectations at EOL were discussed.  Questions and concerns addressed.   Family encouraged to call with questions or concerns.  PMT will continue to support  holistically.   NEXT OF KIN/ wife is his main Art therapist with support of their daughter    SUMMARY OF RECOMMENDATIONS    Code Status/Advance Care Planning:  Limited code- documented today, no CPR, shock or compressions, intubation   Palliative Prophylaxis:   Aspiration, Bowel Regimen, Delirium Protocol, Frequent Pain Assessment and Oral Care  Additional Recommendations (Limitations, Scope, Preferences):  Full Scope Treatment, patient and family are hopeful for improvement and return to SNF  Psycho-social/Spiritual:   Desire for further Chaplaincy support:no  Additional Recommendations: Education on Hospice  Prognosis:   Unable to determine  Discharge Planning: [place] for rehab with Palliative care service follow-up      Primary Diagnoses: Present on Admission: . Septic shock ([place]) . Cholecystitis . Elevated troponin . Dementia . PVD (peripheral vascular disease) ([place]) . CAP (community acquired pneumonia)   I have reviewed the medical record, interviewed the patient and family, and examined the patient. The following aspects are pertinent.  Past Medical History:  Diagnosis Date  . Anemia of chronic disease   . Chronic pain disorder   . Dementia   . Depression   . ESRD on hemodialysis ([place])    a. MWF  . Falls frequently   . GERD (gastroesophageal reflux disease)   . Hypertension   . Insulin dependent diabetes mellitus with complications ([place])   .  Mood disorder ([place])   . Peripheral neuropathy    a. diabetic neuropathy  . Peripheral vascular disease ([place])    a. s/p prior toe amputation; b. followed by [facility]  . Prostate cancer ([place])   . Seizures ([place])    EPILEPSY  . Stroke [facility])    TIA  . Weakness generalized    Social History   Social History  . Marital status: Married    Spouse name: N/A  . Number of children: N/A  . Years of education: N/A   Occupational History  . retired    Social History Main Topics  . Smoking  status: Never Smoker  . Smokeless tobacco: Never Used  . Alcohol use No  . Drug use: No  . Sexual activity: No   Other Topics Concern  . Not on file   Social History Narrative  . No narrative on file   Family History  Problem Relation Age of Onset  . CAD Neg Hx   . Diabetes Neg Hx    Scheduled Meds: . acetaminophen  975 mg Rectal Once  . atorvastatin  20 mg Oral q1800  . insulin aspart  0-5 Units Subcutaneous QHS  . insulin aspart  0-9 Units Subcutaneous TID WC  . ipratropium-albuterol  3 mL Nebulization Q6H  . lamoTRIgine  100 mg Oral QHS  . levETIRAcetam  500 mg Oral Daily  . midodrine  5 mg Oral TID WC  . pantoprazole  40 mg Oral Daily  . sevelamer carbonate  0.8 g Oral TID WC   Continuous Infusions: . DOPamine Stopped (09/24/16 2227)  . heparin 900 Units/hr (09/25/16 0915)  . meropenem     PRN Meds:.acetaminophen **OR** acetaminophen, ondansetron **OR** ondansetron (ZOFRAN) IV, senna-docusate Medications Prior to Admission:  Prior to Admission medications   Medication Sig Start Date End Date Taking? Authorizing Provider  atorvastatin (LIPITOR) 20 MG tablet Take 20 mg by mouth every evening.    Yes [provider]  docusate sodium (COLACE) 100 MG capsule Take 100 mg by mouth 2 (two) times daily as needed for mild constipation.   Yes [provider]  gabapentin (NEURONTIN) 100 MG capsule Take 100 mg by mouth at bedtime.   Yes [provider]  guaifenesin (ROBITUSSIN) 100 MG/5ML syrup Take 200 mg by mouth every 6 (six) hours as needed for cough.    Yes [provider]  HYDROcodone-acetaminophen (NORCO/VICODIN) 5-325 MG tablet Take 1 tablet by mouth every 6 (six) hours as needed for moderate pain or severe pain. 04/18/16  Yes Gladstone Lighter, MD  insulin aspart (NOVOLOG) 100 UNIT/ML injection Inject 0-10 Units into the skin 4 (four) times daily -  before meals and at bedtime. Per sliding scale   Yes [provider]  Insulin  Detemir (LEVEMIR FLEXPEN) 100 UNIT/ML Pen Inject 15 Units into the skin at bedtime.    Yes [provider]  Insulin Glargine (BASAGLAR KWIKPEN) 100 UNIT/ML SOPN Inject 15 Units into the skin at bedtime.   Yes [provider]  lamoTRIgine (LAMICTAL) 100 MG tablet Take 50-100 mg by mouth 2 (two) times daily. 50 mg by mouth every morning and 100 mg by mouth at bedtime   Yes [provider]  levETIRAcetam (KEPPRA) 500 MG tablet Take 500 mg by mouth daily.   Yes [provider]  linagliptin (TRADJENTA) 5 MG TABS tablet Take 5 mg by mouth every evening.   Yes [provider]  metoCLOPramide (REGLAN) 5 MG tablet Take 5 mg by mouth  3 (three) times daily.   Yes [provider]  midodrine (PROAMATINE) 5 MG tablet Take 1 tablet (5 mg total) by mouth 3 (three) times daily with meals. 04/18/16  Yes Gladstone Lighter, MD  omeprazole (PRILOSEC) 20 MG capsule Take 20 mg by mouth daily.    Yes [provider]  ondansetron (ZOFRAN) 4 MG tablet Take 4 mg by mouth every 8 (eight) hours as needed for nausea or vomiting.    Yes [provider]  promethazine (PHENERGAN) 25 MG/ML injection Inject 25 mg into the muscle every 6 (six) hours as needed for nausea or vomiting. 09/23/16 09/24/16 Yes [provider]  sevelamer carbonate (RENVELA) 800 MG tablet Take 800 mg by mouth 3 (three) times daily.    Yes [provider]   Allergies  Allergen Reactions  . Aspirin   . Bc Powder [Aspirin-Salicylamide-Caffeine]   . Penicillins Other (See Comments)    Has patient had a PCN reaction causing immediate rash, facial/tongue/throat swelling, SOB or lightheadedness with hypotension: Unknown Has patient had a PCN reaction causing severe rash involving mucus membranes or skin necrosis: Unknown Has patient had a PCN reaction that required hospitalization: Unknown Has patient had a PCN reaction occurring within the last 10 years: Unknown If all of the  above answers are "NO", then may proceed with Cephalosporin use.  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   Review of Systems  Respiratory: Positive for cough.     Physical Exam  Constitutional: He appears well-developed. He appears ill. Nasal cannula in place.  - elderly frail male, currently receiving dialysis  Cardiovascular: Normal rate, regular rhythm and normal heart sounds.   Pulmonary/Chest: He has decreased breath sounds in the right lower field and the left lower field.  Skin: Skin is warm and dry.    Vital Signs: BP (!) 85/41   Pulse 88   Temp 97.6 F (36.4 C) (Oral)   Resp 19   Ht 5\' 6"  (1.676 m)   Wt 80.7 kg (177 lb 14.6 oz)   SpO2 100%   BMI 28.72 kg/m  Pain Assessment: No/denies pain       SpO2: SpO2: 100 % O2 Device:SpO2: 100 % O2 Flow Rate: .O2 Flow Rate (L/min): 2 L/min  IO: Intake/output summary:  Intake/Output Summary (Last 24 hours) at 09/25/16 1103 Last data filed at 09/25/16 0600  Gross per 24 hour  Intake          1383.15 ml  Output                0 ml  Net          1383.15 ml    LBM: Last BM Date:  (PTA, patient does not know) Baseline Weight: Weight: 76.2 kg (168 lb) Most recent weight: Weight: 80.7 kg (177 lb 14.6 oz)     Palliative Assessment/Data: 30 % PTA   Discussed with Dr Manuella Ghazi  Time In: 1200 Time Out: 1315 Time Total: 75 min Greater than 50%  of this time was spent counseling and coordinating care related to the above assessment and plan.  Signed by: Wadie Lessen, NP   Please contact Palliative Medicine Team phone at [phone] for questions and concerns.  For individual provider: See Shea Evans

## 2016-09-25 NOTE — Clinical Social Work Note (Signed)
CSW received consult that patient is from [facility] [place] contacted [facility], who confirmed that patient is from [facility].  CSW to continue to follow patient's progress throughout discharge planning.  Jones Broom. [place], MSW, [place]  09/25/2016 4:45 PM

## 2016-09-25 NOTE — Progress Notes (Signed)
[facility] [place] NAME: Michael Acosta    MR#:  [id number]  DATE OF BIRTH:  [birth year]  SUBJECTIVE:  Coiled up in bed, off pressors, not able to talk much REVIEW OF SYSTEMS:   Review of Systems  Unable to perform ROS: Acuity of condition   Tolerating Diet: Tolerating PT:   DRUG ALLERGIES:   Allergies  Allergen Reactions  . Aspirin   . Bc Powder [Aspirin-Salicylamide-Caffeine]   . Penicillins Other (See Comments)    Has patient had a PCN reaction causing immediate rash, facial/tongue/throat swelling, SOB or lightheadedness with hypotension: Unknown Has patient had a PCN reaction causing severe rash involving mucus membranes or skin necrosis: Unknown Has patient had a PCN reaction that required hospitalization: Unknown Has patient had a PCN reaction occurring within the last 10 years: Unknown If all of the above answers are "NO", then may proceed with Cephalosporin use.  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    VITALS:  Blood pressure (!) 99/49, pulse 90, temperature 97.9 F (36.6 C), temperature source Oral, resp. rate (!) 23, height 5\' 6"  (1.676 m), weight 80.7 kg (177 lb 14.6 oz), SpO2 96 %.  PHYSICAL EXAMINATION:   Physical Exam  GENERAL:  83 y.o.-year-old patient lying in the bed with no acute distress.  EYES: Pupils equal, round, reactive to light and accommodation. No scleral icterus. Extraocular muscles intact.  HEENT: Head atraumatic, normocephalic. Oropharynx and nasopharynx clear.  NECK:  Supple, no jugular venous distention. No thyroid enlargement, no tenderness.  LUNGS: Normal breath sounds bilaterally, no wheezing, rales, rhonchi. No use of accessory muscles of respiration.  CARDIOVASCULAR: S1, S2 normal. No murmurs, rubs, or gallops.  ABDOMEN: Soft, nontender, nondistended. Bowel sounds present. No organomegaly or mass.  EXTREMITIES: No cyanosis, clubbing or edema b/l.    NEUROLOGIC: Cranial nerves II through XII are intact.  No focal Motor or sensory deficits b/l.   PSYCHIATRIC:  patient is sleepy SKIN: No obvious rash, lesion, or ulcer.   LABORATORY PANEL:  CBC  Recent Labs Lab 09/25/16 0452  WBC 14.8*  HGB 11.1*  HCT 34.3*  PLT 193    Chemistries   Recent Labs Lab 09/25/16 0452 09/25/16 0959  NA 140  --   K 5.0  --   CL 100*  --   CO2 27  --   GLUCOSE 191*  --   BUN 70*  --   CREATININE 6.96*  --   CALCIUM 8.7*  --   MG  --  1.8  AST 139*  --   ALT 156*  --   ALKPHOS 212*  --   BILITOT 1.6*  --    Cardiac Enzymes  Recent Labs Lab 09/25/16 0959  TROPONINI 1.21*   RADIOLOGY:  Dg Chest Port 1 View  Result Date: 09/24/2016 CLINICAL DATA:  Vomiting, fever. History of end-stage renal disease on dialysis. EXAM: PORTABLE CHEST 1 VIEW COMPARISON:  Chest radiograph April 12, 2016 FINDINGS: Elevated LEFT hemidiaphragm with rounded lucency. Mild bronchitic changes and pulmonary vascular congestion. No pleural effusion. Cardiac silhouette is mildly enlarged and unchanged. Calcified aortic knob. Soft tissue planes and included osseous structures are nonsuspicious. IMPRESSION: LEFT lung base atelectasis, less likely cavitating infiltrate. Recommend follow-up PA and lateral views of the chest when clinically able. Mild cardiomegaly and pulmonary vascular congestion. Similar bronchitic changes. Electronically Signed   By: Elon Alas M.D.   On: 09/24/2016 05:38   US Abdomen Limited Ruq  Result Date: 09/24/2016 CLINICAL DATA:  83 year old male with hepatitis.  Initial encounter. EXAM: ULTRASOUND ABDOMEN LIMITED RIGHT UPPER QUADRANT COMPARISON:  03/16/2013 CT. FINDINGS: Gallbladder: Multiple mobile gallstones. Gallbladder wall thickening measuring up to 5.5 mm. Tiny amount of pericholecystic fluid. Patient was not tender over this region during scanning per ultrasound technologist. Common bile duct: Diameter: 4.2 mm. Liver: No dominant mass. Portal triads slightly echogenic. Tiny liver  calcification. Echogenic right kidney. IMPRESSION: Gallstones. Gallbladder wall thickening and tiny amount of pericholecystic fluid may be related to hepatitis as patient was not tender over this region during scanning per ultrasound technologist. Cholecystitis could not be excluded in proper clinical setting. Electronically Signed   By: Genia Del M.D.   On: 09/24/2016 14:04   ASSESSMENT AND PLAN:  Michael Acosta  is a 83 y.o. male with a known history of End-stage renal disease on dialysis, dementia, diabetes mellitus, GERD, CVA, hypertension, neuropathy is a resident of [facility]. Patient was transferred from the facility for vomiting and fever of 10 40F. When patient was evaluated by the EMS his oxygen saturation was 72% on room air and he was put on nonrebreather mask at 100% and his oxygen saturation improved 100% on 10 L by nonrebreather mask  1. Sepsis multifactorial pneumonia/gallbladder disease/cholecystitis - continue IV meropenem - off pressors  2. Acute hypoxic respiratory failure secondary to lower lobe pneumonia - on room air now  3. Hypotension/septic shock -Patient carries a low blood pressure at baseline however blood pressure was in the 50s now on IV dopamine drip and on oral midodrine - off pressors  4. Acute pancreatitis/elevated transaminases -Suspected due to gallstone pancreatitis given the abnormal ultrasound of the gallbladder -Patient not tender in the right upper quadrant however ultrasound shows evidence of possible acute cholecystitis - not a surgical candidate, if anything may be IR guided drainage per surgery -Already on IV meropenem  5. End stage renal disease on hemodialysis - HD per nephro  6. Leukocytosis due to #1 and 4 - improving with abx  Palliative care c/s  Can be moved to any floors per ICU team  Case discussed with Care Management/Social Worker. Management plans discussed with the patient, nursing and they are in  agreement.    CODE STATUS: Full  DVT Prophylaxis: Heparin  TOTAL CRITICAL TIME TAKING CARE OF THIS PATIENT: 30 minutes.  >50% time spent on counselling and coordination of care  POSSIBLE D/C IN 1-2 DAYS, DEPENDING ON CLINICAL CONDITION.  Note: This dictation was prepared with Dragon dictation along with smaller phrase technology. Any transcriptional errors that result from this process are unintentional.  Max Sane M.D on 09/25/2016 at 1:48 PM  Between 7am to 6pm - Pager - [phone]  After 6pm go to [email or web address] - password [email or web address] [facility]  Office  [phone]  CC: Primary care physician; Marden Noble, MD

## 2016-09-25 NOTE — Consult Note (Signed)
[facility] Consultation      Assessment and Plan:  Pneumonia with septic shock/hypotension. -Continue broad-spectrum antibiotics.  Acute pancreatitis, possibly contributing to sepsis. -Patient has been started on Merrem, will continue. -Ultrasound shows gallstones, possibly contribute to pancreatitis. Surgery following. -Triglyceride panel pending.  NSTEMI -Mild elevation of troponins, likely demand ischemia. -Cardiology consulted, continue  conservative measures.  Metabolic encephalopathy, acute -Likely secondary to above, continue supportive measures.  Acute hepatitis. -Uncertain etiology-liver panel pending.  End-stage renal disease. -Nephrology consulted, on hemodialysis.   Date: 09/25/2016  MRN# [id number] Michael Acosta 05/11/1929  Referring Physician: Dr. Estanislado Pandy for dyspnea.   Michael Acosta is a 83 y.o. old male seen in consultation for chief complaint of:    Chief Complaint  Patient presents with  . Emesis   Subjective: Patient is lethargic but arousable, no new complaints today.   Medication:    Current Facility-Administered Medications:  .  acetaminophen (TYLENOL) tablet 650 mg, 650 mg, Oral, Q6H PRN **OR** acetaminophen (TYLENOL) suppository 650 mg, 650 mg, Rectal, Q6H PRN, Pyreddy, Pavan, MD .  atorvastatin (LIPITOR) tablet 20 mg, 20 mg, Oral, q1800, Pyreddy, Pavan, MD .  DOPamine (INTROPIN) 800 mg in dextrose 5 % 250 mL (3.2 mg/mL) infusion, 0-20 mcg/kg/min, Intravenous, Titrated, Kolluru, Sarath, MD, Stopped at 09/24/16 2227 .  heparin ADULT infusion 100 units/mL (25000 units/223mL sodium chloride 0.45%), 900 Units/hr, Intravenous, Continuous, Charlett Nose, RPH, Last Rate: 9 mL/hr at 09/25/16 1100, 900 Units/hr at 09/25/16 1100 .  insulin aspart (novoLOG) injection 0-5 Units, 0-5 Units, Subcutaneous, QHS, Kasa, Kurian, MD .  insulin aspart (novoLOG) injection 0-9 Units, 0-9 Units, Subcutaneous, TID WC, Flora Lipps, MD, 2  Units at 09/25/16 0742 .  ipratropium-albuterol (DUONEB) 0.5-2.5 (3) MG/3ML nebulizer solution 3 mL, 3 mL, Nebulization, Q6H, Ashby Dawes, Tanith Dagostino, MD, 3 mL at 09/25/16 0340 .  lamoTRIgine (LAMICTAL) tablet 100 mg, 100 mg, Oral, QHS, Pyreddy, Pavan, MD, 100 mg at 09/24/16 2220 .  levETIRAcetam (KEPPRA) 100 MG/ML solution 500 mg, 500 mg, Oral, BID, Max Sane, MD, 500 mg at 09/25/16 1133 .  meropenem (MERREM) IVPB SOLR 500 mg, 500 mg, Intravenous, Q24H, Fritzi Mandes, MD .  midodrine (PROAMATINE) tablet 5 mg, 5 mg, Oral, TID WC, Pyreddy, Pavan, MD, 5 mg at 09/25/16 1133 .  ondansetron (ZOFRAN) tablet 4 mg, 4 mg, Oral, Q6H PRN **OR** ondansetron (ZOFRAN) injection 4 mg, 4 mg, Intravenous, Q6H PRN, Pyreddy, Pavan, MD .  pantoprazole sodium (PROTONIX) 40 mg/20 mL oral suspension 40 mg, 40 mg, Oral, Daily, Manuella Ghazi, Vipul, MD .  senna-docusate (Senokot-S) tablet 1 tablet, 1 tablet, Oral, QHS PRN, Pyreddy, Pavan, MD .  sevelamer carbonate (RENVELA) packet 0.8 g, 0.8 g, Oral, TID WC, Pyreddy, Pavan, MD, 0.8 g at 09/25/16 1133   Allergies:  Aspirin; Bc powder [aspirin-salicylamide-caffeine]; and Penicillins  Review of Systems:  Could not be obtained due to lethargy  Physical Examination:   VS: BP (!) 90/44   Pulse (!) 115   Temp 97.6 F (36.4 C) (Oral)   Resp (!) 25   Ht 5\' 6"  (1.676 m)   Wt 177 lb 14.6 oz (80.7 kg)   SpO2 98%   BMI 28.72 kg/m   General Appearance: No distress  Neuro:without focal findings,  speech normal,  HEENT: PERRLA, EOM intact.   Pulmonary: normal breath sounds, No wheezing.  CardiovascularNormal S1,S2.  No m/r/g.   Abdomen: Benign, Soft, non-tender. Renal:  No costovertebral tenderness  GU:  No performed at this time. Endoc: No  evident thyromegaly, no signs of acromegaly. Skin:   warm, no rashes, no ecchymosis  Extremities: normal, no cyanosis, clubbing.  Other findings:    LABORATORY PANEL:   CBC  Recent Labs Lab 09/25/16 0452  WBC 14.8*  HGB 11.1*    HCT 34.3*  PLT 193   ------------------------------------------------------------------------------------------------------------------  Chemistries   Recent Labs Lab 09/25/16 0452 09/25/16 0959  NA 140  --   K 5.0  --   CL 100*  --   CO2 27  --   GLUCOSE 191*  --   BUN 70*  --   CREATININE 6.96*  --   CALCIUM 8.7*  --   MG  --  1.8  AST 139*  --   ALT 156*  --   ALKPHOS 212*  --   BILITOT 1.6*  --    ------------------------------------------------------------------------------------------------------------------  Cardiac Enzymes  Recent Labs Lab 09/25/16 0959  TROPONINI 1.21*   ------------------------------------------------------------  RADIOLOGY:  Dg Chest Port 1 View  Result Date: 09/24/2016 CLINICAL DATA:  Vomiting, fever. History of end-stage renal disease on dialysis. EXAM: PORTABLE CHEST 1 VIEW COMPARISON:  Chest radiograph April 12, 2016 FINDINGS: Elevated LEFT hemidiaphragm with rounded lucency. Mild bronchitic changes and pulmonary vascular congestion. No pleural effusion. Cardiac silhouette is mildly enlarged and unchanged. Calcified aortic knob. Soft tissue planes and included osseous structures are nonsuspicious. IMPRESSION: LEFT lung base atelectasis, less likely cavitating infiltrate. Recommend follow-up PA and lateral views of the chest when clinically able. Mild cardiomegaly and pulmonary vascular congestion. Similar bronchitic changes. Electronically Signed   By: Elon Alas M.D.   On: 09/24/2016 05:38   US Abdomen Limited Ruq  Result Date: 09/24/2016 CLINICAL DATA:  83 year old male with hepatitis.  Initial encounter. EXAM: ULTRASOUND ABDOMEN LIMITED RIGHT UPPER QUADRANT COMPARISON:  03/16/2013 CT. FINDINGS: Gallbladder: Multiple mobile gallstones. Gallbladder wall thickening measuring up to 5.5 mm. Tiny amount of pericholecystic fluid. Patient was not tender over this region during scanning per ultrasound technologist. Common bile duct:  Diameter: 4.2 mm. Liver: No dominant mass. Portal triads slightly echogenic. Tiny liver calcification. Echogenic right kidney. IMPRESSION: Gallstones. Gallbladder wall thickening and tiny amount of pericholecystic fluid may be related to hepatitis as patient was not tender over this region during scanning per ultrasound technologist. Cholecystitis could not be excluded in proper clinical setting. Electronically Signed   By: Genia Del M.D.   On: 09/24/2016 14:04       Thank  you for the consultation and for allowing [facility] to assist in the care of your patient. Our recommendations are noted above.  Please contact us if we can be of further service.   Marda Stalker, MD.  Board Certified in Internal Medicine, Pulmonary Medicine, [place], and Sleep Medicine.  [facility] Office Number: [phone]  Patricia Pesa, M.D.  [place] Border, M.D  09/25/2016

## 2016-09-25 NOTE — Consult Note (Signed)
Cardiology Consultation Note  Patient ID: Michael Acosta, MRN: [id number], DOB/AGE: [birth year] 83 y.o. Admit date: 09/24/2016   Date of Consult: 09/25/2016 Primary Physician: Marden Noble, MD Primary Cardiologist: [facility] Requesting Physician:  Fabio Bering  Chief Complaint: Emesis Reason for Consult: Elevated troponin  HPI: Michael Acosta is a 83 y.o. male who is being seen today for the evaluation of elevated troponin at the request of Marily Memos, NP. Patient has a h/o ESRD on HD (MWF), dementia, IDDM with diabetic neuropathy and prior ulcerations, PVD s/p left popliteal (mid) to proximal peronealbypass with cadaveric vein, Apr 23, 2012 - Angioplasty of proximal bypass and of the left popliteal artery. June 17, 2010: Right BK popliteal to peroneal artery bypass with RSVG, prior CVA, HTN, HLD, frequent falls, anemia of chronic disease, prostate cancer, and mood disorder who presented to [facility] on 6/12 with fever with Tmax 104 and emesis. He was found to have acute respiratory failure with hypoxia requiring NRB 2/2 LLL PNA as well as septic shock 2/2 the above along with acute cholecystitis. Troponin has trended up to 1.45 this morning. Cardiology is asked to further evaluate.   Followed by [facility] for his PVD. No documented prior ischemia evaluations. Admitted 04/2016 with sepsis 2/2 CAP. Echo at that time showed EF 55-60%, GR1DD. He resides at an assisted living facility and was noted to have a Tmax of 104 with associated emesis. EMS was called and he was found to be hypoxic on romo air with oxygen saturations in the 70s%. He was placed on NRB at 10 L with improvement in his saturations to 100%. Upon his arrival to [facility] ED he was noted to drop his BP to 64/34 requiring IV fluids as well as Dopamine. Heart rate 130 bpm. Febrile at 101.8, weight 168 pounds. Labs showed lactic acid 3.2-->2.1-->1.7, troponin 0.07-->0.18-->0.43-->0.77-->1.45, lipase 2491-->1189, amylase 1645, WBC 21.1-->14.8, hgb 11.0, plt 196,  SCr 5.73-->6.96, K+ 4.2-->5.0. TG 97. It appears 1/2 blood cultures has turned positive with Gram stain showing GNR. ID pending at this time. On Meropenem and vancomycin. Hepatitis labs pending. CXR showed left lung atelectasis with possible cavitary infiltrate. RUQ ultrasound showed gallstones with gall bladder thickening along with a tiny amount of pericholecystic fluid. EKG showed sinus tachycardia, 130 bpm, inferior Q waves, baseline artifact, no acute st/t changes. His Dopamine has been discontinued as of 2 AM this morning with stable BP. Currently, he denies any SOB or chest pain. No palpitations or LE swelling.    Past Medical History:  Diagnosis Date  . Anemia of chronic disease   . Chronic pain disorder   . Dementia   . Depression   . ESRD on hemodialysis ([place])    a. MWF  . Falls frequently   . GERD (gastroesophageal reflux disease)   . Hypertension   . Insulin dependent diabetes mellitus with complications ([place])   . Mood disorder ([place])   . Peripheral neuropathy    a. diabetic neuropathy  . Peripheral vascular disease ([place])    a. s/p prior toe amputation; b. followed by [facility]  . Prostate cancer ([place])   . Seizures ([place])    EPILEPSY  . Stroke [facility])    TIA  . Weakness generalized       Most Recent Cardiac Studies: TTE 04/2016: Study Conclusions  - Procedure narrative: Transthoracic echocardiography. The study   was technically difficult, as a result of restricted patient   mobility. - Left ventricle: Wall thickness was increased in a pattern of mild  LVH. There was mild focal basal hypertrophy of the septum.   Systolic function was normal. The estimated ejection fraction was   in the range of 55% to 60%. Doppler parameters are consistent   with abnormal left ventricular relaxation (grade 1 diastolic   dysfunction). - Aortic valve: Valve area (VTI): 3.12 cm^2. Valve area (Vmax):   2.05 cm^2. Valve area (Vmean): 2.33 cm^2.   Surgical History:  Past Surgical History:    Procedure Laterality Date  . COLON SURGERY     RESECTION  . INSERTION OF DIALYSIS CATHETER     SHUNT  . LEG SURGERY Left    MULTIPLE VEIN SURGERIES  . TOTAL HIP ARTHROPLASTY Left      Home Meds: Prior to Admission medications   Medication Sig Start Date End Date Taking? Authorizing Provider  atorvastatin (LIPITOR) 20 MG tablet Take 20 mg by mouth every evening.    Yes [provider]  docusate sodium (COLACE) 100 MG capsule Take 100 mg by mouth 2 (two) times daily as needed for mild constipation.   Yes [provider]  gabapentin (NEURONTIN) 100 MG capsule Take 100 mg by mouth at bedtime.   Yes [provider]  guaifenesin (ROBITUSSIN) 100 MG/5ML syrup Take 200 mg by mouth every 6 (six) hours as needed for cough.    Yes [provider]  HYDROcodone-acetaminophen (NORCO/VICODIN) 5-325 MG tablet Take 1 tablet by mouth every 6 (six) hours as needed for moderate pain or severe pain. 04/18/16  Yes Gladstone Lighter, MD  insulin aspart (NOVOLOG) 100 UNIT/ML injection Inject 0-10 Units into the skin 4 (four) times daily -  before meals and at bedtime. Per sliding scale   Yes [provider]  Insulin Detemir (LEVEMIR FLEXPEN) 100 UNIT/ML Pen Inject 15 Units into the skin at bedtime.    Yes [provider]  Insulin Glargine (BASAGLAR KWIKPEN) 100 UNIT/ML SOPN Inject 15 Units into the skin at bedtime.   Yes [provider]  lamoTRIgine (LAMICTAL) 100 MG tablet Take 50-100 mg by mouth 2 (two) times daily. 50 mg by mouth every morning and 100 mg by mouth at bedtime   Yes [provider]  levETIRAcetam (KEPPRA) 500 MG tablet Take 500 mg by mouth daily.   Yes [provider]  linagliptin (TRADJENTA) 5 MG TABS tablet Take 5 mg by mouth every evening.   Yes [provider]  metoCLOPramide (REGLAN) 5 MG tablet Take 5 mg by mouth 3 (three) times daily.   Yes [provider]  midodrine (PROAMATINE) 5 MG tablet  Take 1 tablet (5 mg total) by mouth 3 (three) times daily with meals. 04/18/16  Yes Gladstone Lighter, MD  omeprazole (PRILOSEC) 20 MG capsule Take 20 mg by mouth daily.    Yes [provider]  ondansetron (ZOFRAN) 4 MG tablet Take 4 mg by mouth every 8 (eight) hours as needed for nausea or vomiting.    Yes [provider]  promethazine (PHENERGAN) 25 MG/ML injection Inject 25 mg into the muscle every 6 (six) hours as needed for nausea or vomiting. 09/23/16 09/24/16 Yes [provider]  sevelamer carbonate (RENVELA) 800 MG tablet Take 800 mg by mouth 3 (three) times daily.    Yes [provider]    Inpatient Medications:  . acetaminophen  975 mg Rectal Once  . atorvastatin  20 mg Oral q1800  . heparin  4,000 Units Intravenous Once  . insulin aspart  0-5 Units Subcutaneous QHS  . insulin aspart  0-9 Units Subcutaneous TID WC  . ipratropium-albuterol  3 mL Nebulization Q6H  . lamoTRIgine  100 mg Oral QHS  . levETIRAcetam  500 mg Oral Daily  . midodrine  5 mg Oral TID WC  . pantoprazole  40 mg Oral Daily  . sevelamer carbonate  0.8 g Oral TID WC   . sodium chloride 75 mL/hr at 09/25/16 0217  . DOPamine Stopped (09/24/16 2227)  . heparin    . meropenem    . meropenem    . vancomycin      Allergies:  Allergies  Allergen Reactions  . Aspirin   . Bc Powder [Aspirin-Salicylamide-Caffeine]   . Penicillins Other (See Comments)    Has patient had a PCN reaction causing immediate rash, facial/tongue/throat swelling, SOB or lightheadedness with hypotension: Unknown Has patient had a PCN reaction causing severe rash involving mucus membranes or skin necrosis: Unknown Has patient had a PCN reaction that required hospitalization: Unknown Has patient had a PCN reaction occurring within the last 10 years: Unknown If all of the above answers are "NO", then may proceed with Cephalosporin use.  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    Social History   Social History  . Marital  status: Married    Spouse name: N/A  . Number of children: N/A  . Years of education: N/A   Occupational History  . retired    Social History Main Topics  . Smoking status: Never Smoker  . Smokeless tobacco: Never Used  . Alcohol use No  . Drug use: No  . Sexual activity: No   Other Topics Concern  . Not on file   Social History Narrative  . No narrative on file     Family History  Problem Relation Age of Onset  . CAD Neg Hx   . Diabetes Neg Hx      Review of Systems: Review of Systems  Unable to perform ROS: Dementia    Labs:  Recent Labs  09/24/16 1110 09/24/16 1636 09/24/16 2259 09/25/16 0452  TROPONINI 0.18* 0.43* 0.77* 1.45*   Lab Results  Component Value Date   WBC 14.8 (H) 09/25/2016   HGB 11.1 (L) 09/25/2016   HCT 34.3 (L) 09/25/2016   MCV 95.7 09/25/2016   PLT 193 09/25/2016     Recent Labs Lab 09/25/16 0452  NA 140  K 5.0  CL 100*  CO2 27  BUN 70*  CREATININE 6.96*  CALCIUM 8.7*  PROT 7.8  BILITOT 1.6*  ALKPHOS 212*  ALT 156*  AST 139*  GLUCOSE 191*   Lab Results  Component Value Date   CHOL 111 07/11/2013   HDL 47 07/11/2013   LDLCALC 50 07/11/2013   TRIG 97 09/25/2016   No results found for: DDIMER  Radiology/Studies:  Dg Chest Port 1 View  Result Date: 09/24/2016 CLINICAL DATA:  Vomiting, fever. History of end-stage renal disease on dialysis. EXAM: PORTABLE CHEST 1 VIEW COMPARISON:  Chest radiograph April 12, 2016 FINDINGS: Elevated LEFT hemidiaphragm with rounded lucency. Mild bronchitic changes and pulmonary vascular congestion. No pleural effusion. Cardiac silhouette is mildly enlarged and unchanged. Calcified aortic knob. Soft tissue planes and included osseous structures are nonsuspicious. IMPRESSION: LEFT lung base atelectasis, less likely cavitating infiltrate. Recommend follow-up PA and lateral views of the chest when clinically able. Mild cardiomegaly and pulmonary vascular congestion. Similar bronchitic  changes. Electronically Signed   By: Elon Alas M.D.   On: 09/24/2016 05:38   US Abdomen Limited Ruq  Result Date: 09/24/2016 CLINICAL DATA:  83 year old male with hepatitis.  Initial encounter. EXAM: ULTRASOUND ABDOMEN LIMITED RIGHT UPPER QUADRANT COMPARISON:  03/16/2013 CT. FINDINGS: Gallbladder: Multiple mobile gallstones. Gallbladder wall thickening measuring up to 5.5 mm. Tiny amount of pericholecystic fluid. Patient was not tender over this region during scanning per ultrasound technologist. Common bile duct: Diameter: 4.2 mm. Liver: No dominant mass. Portal triads slightly echogenic. Tiny liver calcification. Echogenic right kidney. IMPRESSION: Gallstones. Gallbladder wall thickening and tiny amount of pericholecystic fluid may be related to hepatitis as patient was not tender over this region during scanning per ultrasound technologist. Cholecystitis could not be excluded in proper clinical setting. Electronically Signed   By: Genia Del M.D.   On: 09/24/2016 14:04    EKG: Interpreted by me showed: sinus tachycardia, 130 bpm, inferior Q waves, baseline artifact, no acute st/t changes Telemetry: Interpreted by me showed: sinus tachycardia, low 100s bpm  Weights: Filed Weights   09/24/16 0449  Weight: 168 lb (76.2 kg)     Physical Exam: Blood pressure 113/62, pulse 83, temperature 98.2 F (36.8 C), temperature source Axillary, resp. rate (!) 26, height 5\' 6"  (1.676 m), weight 168 lb (76.2 kg), SpO2 99 %. Body mass index is 27.12 kg/m. General: Well developed, well nourished, in no acute distress. Head: Normocephalic, atraumatic, sclera non-icteric, no xanthomas, nares are without discharge.  Neck: Negative for carotid bruits. JVD not elevated. Lungs: Clear bilaterally to auscultation without wheezes, rales, or rhonchi. Breathing is unlabored. Heart: RRR with S1 S2. II/VI diastolic murmur, no rubs, or gallops appreciated. Abdomen: Soft, non-tender, non-distended with  normoactive bowel sounds. No hepatomegaly. No rebound/guarding. No obvious abdominal masses. Msk:  Strength and tone appear normal for age. Extremities: No clubbing or cyanosis. No edema. Distal pedal pulses are 1+ and equal bilaterally. Neuro: Alert. No facial asymmetry. No focal deficit. Moves all extremities spontaneously. Psych:  Responds to questions with a normal affect.    Assessment and Plan:  Principal Problem:   Septic shock ([place]) Active Problems:   CAP (community acquired pneumonia)   Cholecystitis   Elevated troponin   ESRD on hemodialysis ([place])   Dementia   IDDM (insulin dependent diabetes mellitus) ([place])   Bacteremia   PVD (peripheral vascular disease) ([place])    1. Elevated troponin: -Denies chest pain (has dementia) -Troponin trend to 1.45 this morning, continue to cycle until peak -Start heparin gtt x 48-72 hours or until troponin peak -Check TTE -Possibly supply demand ischemia in the setting of #2 as well as ESRD vs NSTEMI in the setting of #2 -Not on beta blocker at this time given recent hypotension, start when able -Would need to discuss further ischemia evaluation with patient and family after his acute illness is improved -No indication for urgent LHC at this time given the below as well as his underlying dementia -ASA allergy noted   2. Septic shock/bacteremia/CAP/acute cholecystitis: -Secondary to the above (#2) -BP stable -Off Dopamine -On meropenem and vancomycin per IM/PCCM -Gram stain with GNR per microbiology, ID pending -Maintain MAP > 65 mmHg  3. ESRD on HD: -He does not appear grossly volume overloaded at this time -Per Renal  4. PVD: -Followed by [facility] ([place])  5. IDDM: -Per IM  6. Dementia: -Per IM   Signed, Christell Faith, PA-C [place] Pager: [phone] 09/25/2016, 9:04 AM

## 2016-09-25 NOTE — Progress Notes (Signed)
Pharmacy Antibiotic Note  Michael Acosta is a 83 y.o. male admitted on 09/24/2016 with bacteremia/acute cholecystitis.  Pharmacy has been consulted for meropenem dosing. Patient receives dialysis Monday/Wednesday/Friday. Patient receiving dialysis today.   Plan: Meropenem dose ordered for 1800 not given. Will order meropenem 500mg  IV x 1 and continue meropenem 500mg  IV Q24hr with next dose at 1800.   Vancomycin discontinued this am on rounds.   ID consult pending, 6/13.   Height: 5\' 6"  (167.6 cm) Weight: 177 lb 14.6 oz (80.7 kg) IBW/kg (Calculated) : 63.8  Temp (24hrs), Avg:98.2 F (36.8 C), Min:97.6 F (36.4 C), Max:98.7 F (37.1 C)   Recent Labs Lab 09/24/16 0505 09/24/16 7510 09/24/16 0938 09/24/16 1110 09/25/16 0452  WBC  --  21.1*  --   --  14.8*  CREATININE  --  5.73*  --   --  6.96*  LATICACIDVEN 3.2*  --  2.1* 1.7  --     Estimated Creatinine Clearance: 7.7 mL/min (A) (by C-G formula based on SCr of 6.96 mg/dL (H)).    Allergies  Allergen Reactions  . Aspirin   . Bc Powder [Aspirin-Salicylamide-Caffeine]   . Penicillins Other (See Comments)    Has patient had a PCN reaction causing immediate rash, facial/tongue/throat swelling, SOB or lightheadedness with hypotension: Unknown Has patient had a PCN reaction causing severe rash involving mucus membranes or skin necrosis: Unknown Has patient had a PCN reaction that required hospitalization: Unknown Has patient had a PCN reaction occurring within the last 10 years: Unknown If all of the above answers are "NO", then may proceed with Cephalosporin use.  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    Antimicrobials this admission: Aztreonam 6/12 x 1   Vancomycin 6/12 x 1  Meropenem 6/13 >>   Dose adjustments this admission: N/A  Microbiology results: 6/12 BCx: Ecoli; aerobic bottle only x 1.  6/12 UCx: Sent  6/12 MRSA PCR: negative  Thank you for allowing pharmacy to be a part of this patient's care.  Srinidhi Landers L 09/25/2016  11:33 AM

## 2016-09-26 DIAGNOSIS — R7881 Bacteremia: Secondary | ICD-10-CM

## 2016-09-26 DIAGNOSIS — R531 Weakness: Secondary | ICD-10-CM

## 2016-09-26 LAB — HEPATITIS B SURFACE ANTIBODY,QUALITATIVE: HEP B S AB: NONREACTIVE

## 2016-09-26 LAB — CBC
HEMATOCRIT: 27.3 % — AB (ref 40.0–52.0)
HEMOGLOBIN: 8.9 g/dL — AB (ref 13.0–18.0)
MCH: 30.9 pg (ref 26.0–34.0)
MCHC: 32.7 g/dL (ref 32.0–36.0)
MCV: 94.4 fL (ref 80.0–100.0)
Platelets: 172 10*3/uL (ref 150–440)
RBC: 2.89 MIL/uL — ABNORMAL LOW (ref 4.40–5.90)
RDW: 15.5 % — AB (ref 11.5–14.5)
WBC: 8.9 10*3/uL (ref 3.8–10.6)

## 2016-09-26 LAB — COMPREHENSIVE METABOLIC PANEL
ALK PHOS: 185 U/L — AB (ref 38–126)
ALT: 95 U/L — AB (ref 17–63)
AST: 79 U/L — AB (ref 15–41)
Albumin: 2.9 g/dL — ABNORMAL LOW (ref 3.5–5.0)
Anion gap: 9 (ref 5–15)
BUN: 44 mg/dL — AB (ref 6–20)
CALCIUM: 8.2 mg/dL — AB (ref 8.9–10.3)
CHLORIDE: 98 mmol/L — AB (ref 101–111)
CO2: 31 mmol/L (ref 22–32)
CREATININE: 4.75 mg/dL — AB (ref 0.61–1.24)
GFR, EST AFRICAN AMERICAN: 12 mL/min — AB (ref >60)
GFR, EST NON AFRICAN AMERICAN: 10 mL/min — AB (ref >60)
Glucose, Bld: 247 mg/dL — ABNORMAL HIGH (ref 65–99)
Potassium: 4 mmol/L (ref 3.5–5.1)
Sodium: 138 mmol/L (ref 135–145)
Total Bilirubin: 0.8 mg/dL (ref 0.3–1.2)
Total Protein: 6.6 g/dL (ref 6.5–8.1)

## 2016-09-26 LAB — GLUCOSE, CAPILLARY
GLUCOSE-CAPILLARY: 183 mg/dL — AB (ref 65–99)
GLUCOSE-CAPILLARY: 156 mg/dL — AB (ref 65–99)
Glucose-Capillary: 208 mg/dL — ABNORMAL HIGH (ref 65–99)
Glucose-Capillary: 196 mg/dL — ABNORMAL HIGH (ref 65–99)

## 2016-09-26 LAB — HEPATITIS B SURFACE ANTIGEN: HEP B S AG: NEGATIVE

## 2016-09-26 LAB — HEPATITIS B CORE ANTIBODY, TOTAL: Hep B Core Total Ab: NEGATIVE

## 2016-09-26 LAB — TROPONIN I: TROPONIN I: 0.79 ng/mL — AB (ref <0.03)

## 2016-09-26 LAB — LIPASE, BLOOD: Lipase: 1444 U/L — ABNORMAL HIGH (ref 11–51)

## 2016-09-26 LAB — AMYLASE: AMYLASE: 1312 U/L — AB (ref 28–100)

## 2016-09-26 LAB — HEPARIN LEVEL (UNFRACTIONATED): HEPARIN UNFRACTIONATED: 0.25 [IU]/mL — AB (ref 0.30–0.70)

## 2016-09-26 MED ORDER — HEPARIN SODIUM (PORCINE) 5000 UNIT/ML IJ SOLN
5000.0000 [IU] | Freq: Three times a day (TID) | INTRAMUSCULAR | Status: DC
  Administered 2016-09-26 – 2016-09-29 (×8): 5000 [IU] via SUBCUTANEOUS
  Filled 2016-09-26 (×8): qty 1

## 2016-09-26 MED ORDER — INSULIN DETEMIR 100 UNIT/ML ~~LOC~~ SOLN
5.0000 [IU] | Freq: Every day | SUBCUTANEOUS | Status: DC
  Administered 2016-09-26 – 2016-09-27 (×2): 5 [IU] via SUBCUTANEOUS
  Filled 2016-09-26 (×5): qty 0.05

## 2016-09-26 MED ORDER — HEPARIN BOLUS VIA INFUSION
1200.0000 [IU] | Freq: Once | INTRAVENOUS | Status: AC
  Administered 2016-09-26: 1200 [IU] via INTRAVENOUS
  Filled 2016-09-26: qty 1200

## 2016-09-26 NOTE — Progress Notes (Signed)
Inpatient Diabetes Program Recommendations  AACE/ADA: New Consensus Statement on Inpatient Glycemic Control (2015)  Target Ranges:  Prepandial:   less than 140 mg/dL      Peak postprandial:   less than 180 mg/dL (1-2 hours)      Critically ill patients:  140 - 180 mg/dL   Results for DAMARI, SUASTEGUI (MRN [id number]) as of 09/26/2016 09:52  Ref. Range 09/25/2016 07:30 09/25/2016 11:59 09/25/2016 16:49 09/25/2016 20:50 09/25/2016 22:06 09/26/2016 07:53  Glucose-Capillary Latest Ref Range: 65 - 99 mg/dL 174 (H) 106 (H) 184 (H) 236 (H) 246 (H) 183 (H)   Review of Glycemic Control  Diabetes history: DM2 Outpatient Diabetes medications: Levemir 15 units QHS, Novolog 0-10 units QID, Tradjenta 5 mg QAM Current orders for Inpatient glycemic control: Novolog 0-9 units TID with meals, Novolog 0-5 units QHS  Inpatient Diabetes Program Recommendations:  Insulin - Basal: Please consider ordering Levemir 5 units Q24H. Insulin - Meal Coverage: If patient is eating at least 50% of meals, please consider ordering Novolog 2 units TID with meals for meal coverage.  Thanks, Barnie Alderman, RN, MSN, CDE Diabetes Coordinator Inpatient Diabetes Program [phone] (Team Pager from 8am to 5pm)

## 2016-09-26 NOTE — Progress Notes (Signed)
CC: Pancreatitis Subjective: Patient admitted with septic shock and found to have pancreatitis. Patient continues to state that he is not having any abdominal pain. He is a very poor historian.  Objective: Vital signs in last 24 hours: Temp:  [97.6 F (36.4 C)-98.6 F (37 C)] 98.6 F (37 C) (06/14 0318) Pulse Rate:  [72-115] 99 (06/14 0318) Resp:  [14-27] 18 (06/14 0318) BP: (77-129)/(29-67) 100/44 (06/14 0318) SpO2:  [92 %-100 %] 92 % (06/14 0318) Weight:  [80.7 kg (177 lb 14.6 oz)] 80.7 kg (177 lb 14.6 oz) (06/13 1230) Last BM Date: 09/25/16  Intake/Output from previous day: 06/13 0701 - 06/14 0700 In: 15.8 [I.V.:15.8] Out: -380  Intake/Output this shift: Total I/O In: 120 [P.O.:120] Out: -   Physical exam:  Gen.: No acute distress Chest: Clear to auscultation Heart: Regular rhythm Abdomen: Soft, nontender, nondistended  Lab Results: CBC   Recent Labs  09/25/16 0452 09/26/16 0207  WBC 14.8* 8.9  HGB 11.1* 8.9*  HCT 34.3* 27.3*  PLT 193 172   BMET  Recent Labs  09/25/16 0452 09/26/16 0207  NA 140 138  K 5.0 4.0  CL 100* 98*  CO2 27 31  GLUCOSE 191* 247*  BUN 70* 44*  CREATININE 6.96* 4.75*  CALCIUM 8.7* 8.2*   PT/INR  Recent Labs  09/25/16 0452  LABPROT 14.9  INR 1.16   ABG  Recent Labs  09/24/16 0505  PHART 7.48*  HCO3 30.5*    Studies/Results: US Abdomen Limited Ruq  Result Date: 09/24/2016 CLINICAL DATA:  83 year old male with hepatitis.  Initial encounter. EXAM: ULTRASOUND ABDOMEN LIMITED RIGHT UPPER QUADRANT COMPARISON:  03/16/2013 CT. FINDINGS: Gallbladder: Multiple mobile gallstones. Gallbladder wall thickening measuring up to 5.5 mm. Tiny amount of pericholecystic fluid. Patient was not tender over this region during scanning per ultrasound technologist. Common bile duct: Diameter: 4.2 mm. Liver: No dominant mass. Portal triads slightly echogenic. Tiny liver calcification. Echogenic right kidney. IMPRESSION: Gallstones.  Gallbladder wall thickening and tiny amount of pericholecystic fluid may be related to hepatitis as patient was not tender over this region during scanning per ultrasound technologist. Cholecystitis could not be excluded in proper clinical setting. Electronically Signed   By: Genia Del M.D.   On: 09/24/2016 14:04    Anti-infectives: Anti-infectives    Start     Dose/Rate Route Frequency Ordered Stop   09/26/16 1000  meropenem (MERREM) IVPB SOLR 500 mg     500 mg 100 mL/hr over 30 Minutes Intravenous Every 24 hours 09/25/16 1209     09/25/16 1800  meropenem (MERREM) IVPB SOLR 500 mg  Status:  Discontinued     500 mg 100 mL/hr over 30 Minutes Intravenous Every 24 hours 09/25/16 0751 09/25/16 1209   09/25/16 0900  meropenem (MERREM) 500 mg in sodium chloride 0.9 % 50 mL IVPB  Status:  Discontinued     500 mg 100 mL/hr over 30 Minutes Intravenous  Once 09/25/16 0847 09/25/16 0854   09/25/16 0900  meropenem (MERREM) IVPB SOLR 500 mg     500 mg 100 mL/hr over 30 Minutes Intravenous  Once 09/25/16 0854 09/25/16 0945   09/24/16 1800  meropenem (MERREM) 500 mg in sodium chloride 0.9 % 50 mL IVPB  Status:  Discontinued     500 mg 100 mL/hr over 30 Minutes Intravenous Every 24 hours 09/24/16 1136 09/25/16 0751   09/24/16 0630  vancomycin (VANCOCIN) 1,750 mg in sodium chloride 0.9 % 500 mL IVPB     1,750 mg 250 mL/hr  over 120 Minutes Intravenous  Once 09/24/16 0615 09/24/16 1328   09/24/16 0623  vancomycin (VANCOCIN) IVPB 750 mg/150 ml premix  Status:  Discontinued     750 mg 150 mL/hr over 60 Minutes Intravenous Every Dialysis 09/24/16 0623 09/25/16 1017   09/24/16 0615  aztreonam (AZACTAM) 2 g in dextrose 5 % 50 mL IVPB     2 g 100 mL/hr over 30 Minutes Intravenous  Once 09/24/16 0605 09/24/16 0824   09/24/16 0615  vancomycin (VANCOCIN) IVPB 1000 mg/200 mL premix  Status:  Discontinued     1,000 mg 200 mL/hr over 60 Minutes Intravenous  Once 09/24/16 6116 09/24/16 0615       Assessment/Plan:  83 year old male with multiple medical problems and pancreatitis. Although the pancreatitis is most likely biliary in origin he continues to be a very poor surgical candidate. No plans for any operative intervention at this time. Surgery will continue to follow with you in case he would require a percutaneous cholecystostomy tube during this hospital stay.  Dhanvi Boesen T. Adonis Huguenin, MD, [facility] General Surgeon [facility]  Day [facility] [phone] Night [facility] [phone] 09/26/2016

## 2016-09-26 NOTE — Progress Notes (Signed)
[facility] [place] NAME: Quillan Whitter    MR#:  [id number]  DATE OF BIRTH:  1929-07-03  SUBJECTIVE:  No new complaints REVIEW OF SYSTEMS:   Review of Systems  Unable to perform ROS: Acuity of condition   Tolerating Diet: Yes Tolerating PT:   DRUG ALLERGIES:   Allergies  Allergen Reactions  . Aspirin   . Bc Powder [Aspirin-Salicylamide-Caffeine]   . Penicillins Other (See Comments)    Has patient had a PCN reaction causing immediate rash, facial/tongue/throat swelling, SOB or lightheadedness with hypotension: Unknown Has patient had a PCN reaction causing severe rash involving mucus membranes or skin necrosis: Unknown Has patient had a PCN reaction that required hospitalization: Unknown Has patient had a PCN reaction occurring within the last 10 years: Unknown If all of the above answers are "NO", then may proceed with Cephalosporin use.  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    VITALS:  Blood pressure 110/63, pulse 84, temperature 98.3 F (36.8 C), resp. rate 20, height 5\' 6"  (1.676 m), weight 80.7 kg (177 lb 14.6 oz), SpO2 90 %.  PHYSICAL EXAMINATION:   Physical Exam  GENERAL:  83 y.o.-year-old patient lying in the bed with no acute distress.  EYES: Pupils equal, round, reactive to light and accommodation. No scleral icterus. Extraocular muscles intact.  HEENT: Head atraumatic, normocephalic. Oropharynx and nasopharynx clear.  NECK:  Supple, no jugular venous distention. No thyroid enlargement, no tenderness.  LUNGS: Normal breath sounds bilaterally, no wheezing, rales, rhonchi. No use of accessory muscles of respiration.  CARDIOVASCULAR: S1, S2 normal. No murmurs, rubs, or gallops.  ABDOMEN: Soft, nontender, nondistended. Bowel sounds present. No organomegaly or mass.  EXTREMITIES: No cyanosis, clubbing or edema b/l.    NEUROLOGIC: Cranial nerves II through XII are intact. No focal Motor or sensory deficits b/l.   PSYCHIATRIC:  patient  is sleepy SKIN: No obvious rash, lesion, or ulcer.   LABORATORY PANEL:  CBC  Recent Labs Lab 09/26/16 0207  WBC 8.9  HGB 8.9*  HCT 27.3*  PLT 172    Chemistries   Recent Labs Lab 09/25/16 0959 09/26/16 0207  NA  --  138  K  --  4.0  CL  --  98*  CO2  --  31  GLUCOSE  --  247*  BUN  --  44*  CREATININE  --  4.75*  CALCIUM  --  8.2*  MG 1.8  --   AST  --  79*  ALT  --  95*  ALKPHOS  --  185*  BILITOT  --  0.8   Cardiac Enzymes  Recent Labs Lab 09/25/16 2323  TROPONINI 0.79*   RADIOLOGY:  US Abdomen Limited Ruq  Result Date: 09/24/2016 CLINICAL DATA:  83 year old male with hepatitis.  Initial encounter. EXAM: ULTRASOUND ABDOMEN LIMITED RIGHT UPPER QUADRANT COMPARISON:  03/16/2013 CT. FINDINGS: Gallbladder: Multiple mobile gallstones. Gallbladder wall thickening measuring up to 5.5 mm. Tiny amount of pericholecystic fluid. Patient was not tender over this region during scanning per ultrasound technologist. Common bile duct: Diameter: 4.2 mm. Liver: No dominant mass. Portal triads slightly echogenic. Tiny liver calcification. Echogenic right kidney. IMPRESSION: Gallstones. Gallbladder wall thickening and tiny amount of pericholecystic fluid may be related to hepatitis as patient was not tender over this region during scanning per ultrasound technologist. Cholecystitis could not be excluded in proper clinical setting. Electronically Signed   By: Genia Del M.D.   On: 09/24/2016 14:04   ASSESSMENT AND PLAN:  Arthuro Canelo  is a 83 y.o. male with a known history of End-stage renal disease on dialysis, dementia, diabetes mellitus, GERD, CVA, hypertension, neuropathy is a resident of [facility]. Patient was transferred from the facility for vomiting and fever of 10 54F. When patient was evaluated by the EMS his oxygen saturation was 72% on room air and he was put on nonrebreather mask at 100% and his oxygen saturation improved 100% on 10 L by nonrebreather mask  1.  Sepsis multifactorial pneumonia/gallbladder disease/cholecystitis - continue IV meropenem - off pressors  2. Acute hypoxic respiratory failure secondary to lower lobe pneumonia - on room air now  3. Hypotension/septic shock Now resolved  4. Acute pancreatitis/elevated transaminases -Suspected due to gallstone pancreatitis given the abnormal ultrasound of the gallbladder -Patient not tender in the right upper quadrant however ultrasound shows evidence of possible acute cholecystitis - not a surgical candidate, if anything may be IR guided drainage per surgery -Continue IV meropenem  5. End stage renal disease on hemodialysis - HD per nephro  6. Leukocytosis due to #1 and 4 - improving with abx  7.  Elevated troponin:  troponin now down trending from a peak of 1.45. On heparin gtt. Echo showed preserved LVSF with normal wall motion.  Palliative care c/s -likely outpatient palliative care follow-up    Case discussed with Care Management/Social Worker. Management plans discussed with the patient, nursing and they are in agreement.    CODE STATUS: Partial  DVT Prophylaxis: Heparin  TOTAL CRITICAL TIME TAKING CARE OF THIS PATIENT: 30 minutes.  >50% time spent on counselling and coordination of care  POSSIBLE D/C IN 1-2 DAYS, DEPENDING ON CLINICAL CONDITION.  Note: This dictation was prepared with Dragon dictation along with smaller phrase technology. Any transcriptional errors that result from this process are unintentional.  Max Sane M.D on 09/26/2016 at 1:11 PM  Between 7am to 6pm - Pager - [phone]  After 6pm go to [email or web address] - password [email or web address] [facility]  Office  [phone]  CC: Primary care physician; Marden Noble, MD

## 2016-09-26 NOTE — NC FL2 (Deleted)
[place] LEVEL OF CARE SCREENING TOOL     IDENTIFICATION  Patient Name: Michael Acosta Birthdate: 03-Sep-1929 Sex: male Admission Date (Current Location): 09/24/2016  [place] and [place] Number:  Engineering geologist and Address:  [facility], [address] [place],  [address]      Provider Number: [id number]  Attending Physician Name and Address:  Max Sane, MD  Relative Name and Phone Number:  Riddick,Valerie Daughter [phone] or Bostyn, Bogie [phone]     Current Level of Care: Hospital Recommended Level of Care: [place] Prior Approval Number:    Date Approved/Denied:   PASRR Number: [id number]  Discharge Plan: SNF    Current Diagnoses: Patient Active Problem List   Diagnosis Date Noted  . Cholecystitis 09/25/2016  . Elevated troponin 09/25/2016  . ESRD on hemodialysis ([place]) 09/25/2016  . Dementia 09/25/2016  . PVD (peripheral vascular disease) ([place]) 09/25/2016  . IDDM (insulin dependent diabetes mellitus) ([place]) 09/25/2016  . Bacteremia 09/25/2016  . DNR (do not resuscitate) discussion 09/25/2016  . Palliative care by specialist 09/25/2016  . Sepsis ([place])   . Septic shock ([place]) 09/24/2016  . CAP (community acquired pneumonia) 04/13/2016    Orientation RESPIRATION BLADDER Height & Weight     Self, Place  Normal Continent Weight: 177 lb 14.6 oz (80.7 kg) Height:  5\' 6"  (167.6 cm)  BEHAVIORAL SYMPTOMS/MOOD NEUROLOGICAL BOWEL NUTRITION STATUS      Continent Diet  AMBULATORY STATUS COMMUNICATION OF NEEDS Skin   Limited Assist Verbally Normal                       Personal Care Assistance Level of Assistance  Bathing, Feeding, Dressing Bathing Assistance: Limited assistance Feeding assistance: Limited assistance Dressing Assistance: Limited assistance     Functional Limitations Info  Sight, Hearing, Speech Sight Info: Adequate Hearing Info: Adequate Speech Info:  Adequate    SPECIAL CARE FACTORS FREQUENCY                       Contractures Contractures Info: Not present    Additional Factors Info  Code Status, Allergies, Insulin Sliding Scale Code Status Info: Partial Code Allergies Info: ASPIRIN, BC POWDER ASPIRIN-SALICYLAMIDE-CAFFEINE, PENICILLINS   Insulin Sliding Scale Info: insulin aspart (novoLOG) injection 0-9 Units 3x a day with meals       Current Medications (09/26/2016):  This is the current hospital active medication list Current Facility-Administered Medications  Medication Dose Route Frequency Provider Last Rate Last Dose  . acetaminophen (TYLENOL) tablet 650 mg  650 mg Oral Q6H PRN Saundra Shelling, MD       Or  . acetaminophen (TYLENOL) suppository 650 mg  650 mg Rectal Q6H PRN Pyreddy, Pavan, MD      . atorvastatin (LIPITOR) tablet 20 mg  20 mg Oral q1800 Saundra Shelling, MD   20 mg at 09/25/16 1756  . insulin aspart (novoLOG) injection 0-5 Units  0-5 Units Subcutaneous QHS Flora Lipps, MD   2 Units at 09/25/16 2223  . insulin aspart (novoLOG) injection 0-9 Units  0-9 Units Subcutaneous TID WC Flora Lipps, MD   2 Units at 09/26/16 0835  . ipratropium-albuterol (DUONEB) 0.5-2.5 (3) MG/3ML nebulizer solution 3 mL  3 mL Nebulization Q6H Laverle Hobby, MD   3 mL at 09/26/16 0728  . lamoTRIgine (LAMICTAL) tablet 100 mg  100 mg Oral QHS Saundra Shelling, MD   100 mg at 09/25/16 2223  . levETIRAcetam (KEPPRA)  100 MG/ML solution 500 mg  500 mg Oral BID Max Sane, MD   500 mg at 09/26/16 0835  . meropenem (MERREM) IVPB SOLR 500 mg  500 mg Intravenous Q24H Merilyn Baba, RPH      . midodrine (PROAMATINE) tablet 5 mg  5 mg Oral TID WC Pyreddy, Reatha Harps, MD   5 mg at 09/25/16 1756  . ondansetron (ZOFRAN) tablet 4 mg  4 mg Oral Q6H PRN Saundra Shelling, MD       Or  . ondansetron (ZOFRAN) injection 4 mg  4 mg Intravenous Q6H PRN Pyreddy, Pavan, MD      . pantoprazole sodium (PROTONIX) 40 mg/20 mL oral suspension 40 mg  40 mg  Oral Daily Max Sane, MD   40 mg at 09/25/16 1322  . senna-docusate (Senokot-S) tablet 1 tablet  1 tablet Oral QHS PRN Pyreddy, Reatha Harps, MD      . sevelamer carbonate (RENVELA) packet 0.8 g  0.8 g Oral TID WC Pyreddy, Reatha Harps, MD   0.8 g at 09/26/16 4098     Discharge Medications: Please see discharge summary for a list of discharge medications.  Relevant Imaging Results:  Relevant Lab Results:   Additional Information SSN [id number]  Ross Ludwig, [place]

## 2016-09-26 NOTE — Progress Notes (Signed)
[facility]  ROUNDING NOTE   Subjective:   Michael Acosta growing in blood. Concerned for cholecystitis.  Hemodialysis yesterday. Tolerated treatment well. UF of 374mL   Objective:  Vital signs in last 24 hours:  Temp:  [97.6 F (36.4 C)-98.6 F (37 C)] 98.6 F (37 C) (06/14 0318) Pulse Rate:  [82-115] 99 (06/14 0318) Resp:  [15-27] 18 (06/14 0318) BP: (85-129)/(29-67) 100/44 (06/14 0318) SpO2:  [92 %-98 %] 92 % (06/14 0318) Weight:  [80.7 kg (177 lb 14.6 oz)] 80.7 kg (177 lb 14.6 oz) (06/13 1230)  Weight change:  Filed Weights   09/24/16 0449 09/25/16 0920 09/25/16 1230  Weight: 76.2 kg (168 lb) 80.7 kg (177 lb 14.6 oz) 80.7 kg (177 lb 14.6 oz)    Intake/Output: I/O last 3 completed shifts: In: 931.7 [I.V.:931.7] Out: -380    Intake/Output this shift:  Total I/O In: 120 [P.O.:120] Out: -   Physical Exam: General: Ill appearing  Head: nonrebreather  Eyes: Anicteric, PERRL  Neck: Supple, trachea midline  Lungs:  Clear to auscultation  Heart: Regular rate and rhythm  Abdomen:  Soft, nontender  Extremities: no peripheral edema.  Neurologic: Nonfocal, moving all four extremities  Skin: No lesions  Access: Left AVF    Basic Metabolic Panel:  Recent Labs Lab 09/24/16 0638 09/25/16 0452 09/25/16 0959 09/26/16 0207  NA 142 140  --  138  K 4.2 5.0  --  4.0  CL 95* 100*  --  98*  CO2 31 27  --  31  GLUCOSE 206* 191*  --  247*  BUN 48* 70*  --  44*  CREATININE 5.73* 6.96*  --  4.75*  CALCIUM 9.4 8.7*  --  8.2*  MG  --   --  1.8  --     Liver Function Tests:  Recent Labs Lab 09/24/16 0638 09/25/16 0452 09/26/16 0207  AST 243* 139* 79*  ALT 198* 156* 95*  ALKPHOS 258* 212* 185*  BILITOT 2.5* 1.6* 0.8  PROT 8.3* 7.8 6.6  ALBUMIN 3.7 3.4* 2.9*    Recent Labs Lab 09/24/16 0638 09/25/16 0452 09/26/16 0207  LIPASE 2,491* 1,189* 1,444*  AMYLASE  --  1,645* 1,312*   No results for input(s): AMMONIA in the last 168  hours.  CBC:  Recent Labs Lab 09/24/16 0638 09/25/16 0452 09/26/16 0207  WBC 21.1* 14.8* 8.9  NEUTROABS 19.9*  --   --   HGB 11.0* 11.1* 8.9*  HCT 33.8* 34.3* 27.3*  MCV 93.1 95.7 94.4  PLT 196 193 172    Cardiac Enzymes:  Recent Labs Lab 09/24/16 2259 09/25/16 0452 09/25/16 0959 09/25/16 1808 09/25/16 2323  TROPONINI 0.77* 1.45* 1.21* 0.99* 0.79*    BNP: Invalid input(s): POCBNP  CBG:  Recent Labs Lab 09/25/16 1159 09/25/16 1649 09/25/16 2050 09/25/16 2206 09/26/16 0753  GLUCAP 106* 184* 236* 246* 183*    Microbiology: Results for orders placed or performed during the hospital encounter of 09/24/16  Blood Culture (routine x 2)     Status: None (Preliminary result)   Collection Time: 09/24/16  6:20 AM  Result Value Ref Range Status   Specimen Description BLOOD ARTERIAL DRAW FROM R ARM  Final   Special Requests BOTTLES DRAWN AEROBIC AND ANAEROBIC BCAV  Final   Culture NO GROWTH 2 DAYS  Final   Report Status PENDING  Incomplete  Blood Culture (routine x 2)     Status: Abnormal (Preliminary result)   Collection Time: 09/24/16  6:32 AM  Result  Value Ref Range Status   Specimen Description BLOOD R HAND  Final   Special Requests   Final    BOTTLES DRAWN AEROBIC AND ANAEROBIC Blood Culture adequate volume   Culture  Setup Time   Final    GRAM NEGATIVE RODS AEROBIC BOTTLE ONLY CRITICAL RESULT CALLED TO, READ BACK BY AND VERIFIED WITH: HANK ZOMPA ON 09/25/16 AT 0944 QSD    Culture (A)  Final    [name] SUSCEPTIBILITIES TO FOLLOW Performed at [facility], [place] [address] [place], [place] [address]    Report Status PENDING  Incomplete  Blood Culture ID Panel (Reflexed)     Status: Abnormal   Collection Time: 09/24/16  6:32 AM  Result Value Ref Range Status   Enterococcus species NOT DETECTED NOT DETECTED Final   Listeria monocytogenes NOT DETECTED NOT DETECTED Final   Staphylococcus species NOT DETECTED NOT DETECTED Final    Staphylococcus aureus NOT DETECTED NOT DETECTED Final   Streptococcus species NOT DETECTED NOT DETECTED Final   Streptococcus agalactiae NOT DETECTED NOT DETECTED Final   Streptococcus pneumoniae NOT DETECTED NOT DETECTED Final   Streptococcus pyogenes NOT DETECTED NOT DETECTED Final   Acinetobacter baumannii NOT DETECTED NOT DETECTED Final   Enterobacteriaceae species DETECTED (A) NOT DETECTED Final    Comment: Enterobacteriaceae represent a large family of gram-negative bacteria, not a single organism. CRITICAL RESULT CALLED TO, READ BACK BY AND VERIFIED WITH: HANK ZOMPA ON 09/25/16 AT 0944 QSD    Enterobacter cloacae complex NOT DETECTED NOT DETECTED Final   [name] DETECTED (A) NOT DETECTED Final    Comment: CRITICAL RESULT CALLED TO, READ BACK BY AND VERIFIED WITH: HANK ZOMPA ON 09/25/16 AT 0944 QSD    Klebsiella oxytoca NOT DETECTED NOT DETECTED Final   Klebsiella pneumoniae NOT DETECTED NOT DETECTED Final   Proteus species NOT DETECTED NOT DETECTED Final   Serratia marcescens NOT DETECTED NOT DETECTED Final   Carbapenem resistance NOT DETECTED NOT DETECTED Final   Haemophilus influenzae NOT DETECTED NOT DETECTED Final   Neisseria meningitidis NOT DETECTED NOT DETECTED Final   Pseudomonas aeruginosa NOT DETECTED NOT DETECTED Final   Candida albicans NOT DETECTED NOT DETECTED Final   Candida glabrata NOT DETECTED NOT DETECTED Final   Candida krusei NOT DETECTED NOT DETECTED Final   Candida parapsilosis NOT DETECTED NOT DETECTED Final   Candida tropicalis NOT DETECTED NOT DETECTED Final  MRSA PCR Screening     Status: None   Collection Time: 09/24/16  9:00 PM  Result Value Ref Range Status   MRSA by PCR NEGATIVE NEGATIVE Final    Comment:        The GeneXpert MRSA Assay (FDA approved for NASAL specimens only), is one component of a comprehensive MRSA colonization surveillance program. It is not intended to diagnose MRSA infection nor to guide or monitor treatment  for MRSA infections.     Coagulation Studies:  Recent Labs  09/25/16 0452  LABPROT 14.9  INR 1.16    Urinalysis: No results for input(s): COLORURINE, LABSPEC, PHURINE, GLUCOSEU, HGBUR, BILIRUBINUR, KETONESUR, PROTEINUR, UROBILINOGEN, NITRITE, LEUKOCYTESUR in the last 72 hours.  Invalid input(s): APPERANCEUR    Imaging: US Abdomen Limited Ruq  Result Date: 09/24/2016 CLINICAL DATA:  83 year old male with hepatitis.  Initial encounter. EXAM: ULTRASOUND ABDOMEN LIMITED RIGHT UPPER QUADRANT COMPARISON:  03/16/2013 CT. FINDINGS: Gallbladder: Multiple mobile gallstones. Gallbladder wall thickening measuring up to 5.5 mm. Tiny amount of pericholecystic fluid. Patient was not tender over this region during scanning per  ultrasound technologist. Common bile duct: Diameter: 4.2 mm. Liver: No dominant mass. Portal triads slightly echogenic. Tiny liver calcification. Echogenic right kidney. IMPRESSION: Gallstones. Gallbladder wall thickening and tiny amount of pericholecystic fluid may be related to hepatitis as patient was not tender over this region during scanning per ultrasound technologist. Cholecystitis could not be excluded in proper clinical setting. Electronically Signed   By: Genia Del M.D.   On: 09/24/2016 14:04     Medications:   . meropenem     . atorvastatin  20 mg Oral q1800  . insulin aspart  0-5 Units Subcutaneous QHS  . insulin aspart  0-9 Units Subcutaneous TID WC  . ipratropium-albuterol  3 mL Nebulization Q6H  . lamoTRIgine  100 mg Oral QHS  . levETIRAcetam  500 mg Oral BID  . midodrine  5 mg Oral TID WC  . pantoprazole sodium  40 mg Oral Daily  . sevelamer carbonate  0.8 g Oral TID WC   acetaminophen **OR** acetaminophen, ondansetron **OR** ondansetron (ZOFRAN) IV, senna-docusate  Assessment/ Plan:  Michael Acosta is a 83 y.o. black male with end-stage renal disease on hemodialysis, hypertension, diabetes type 2, insulin-dependent, seizure disorder,  peripheral neuropathy, vascular dementia  MWF [facility] [place] Left arm AVF  1. End Stage Renal Disease: MWF.  Continue MWF schedule.   2. Sepsis: Michael Acosta in blood cultures. Benign abdominal exam - meropenem. Appreciate ID input.   3. Anemia of chronic kidney disease: hemoglobin 8.9 - epo with next HD treatment.  Mircera as outpatient.  .   4. Secondary Hyperparathyroidism:   - sevelamer    LOS: 2 Zynasia Burklow 6/14/201811:15 AM

## 2016-09-26 NOTE — NC FL2 (Signed)
[place] LEVEL OF CARE SCREENING TOOL     IDENTIFICATION  Patient Name: Michael Acosta Birthdate: February 02, 1930 Sex: male Admission Date (Current Location): 09/24/2016  [place] and [place] Number:  Engineering geologist and Address:  [facility], [address] [place], [place] [address]      Provider Number: [id number]  Attending Physician Name and Address:  Max Sane, MD  Relative Name and Phone Number:  Riddick,Valerie Daughter [phone] or Tobey, Schmelzle [phone]     Current Level of Care: Hospital Recommended Level of Care: [place] Prior Approval Number:    Date Approved/Denied:   PASRR Number: [id number]  Discharge Plan: SNF    Current Diagnoses: Patient Active Problem List   Diagnosis Date Noted  . Weakness generalized   . Cholecystitis 09/25/2016  . Elevated troponin 09/25/2016  . ESRD on hemodialysis ([place]) 09/25/2016  . Dementia 09/25/2016  . PVD (peripheral vascular disease) ([place]) 09/25/2016  . IDDM (insulin dependent diabetes mellitus) ([place]) 09/25/2016  . Bacteremia 09/25/2016  . DNR (do not resuscitate) discussion 09/25/2016  . Palliative care by specialist 09/25/2016  . Sepsis ([place])   . Septic shock ([place]) 09/24/2016  . CAP (community acquired pneumonia) 04/13/2016    Orientation RESPIRATION BLADDER Height & Weight     Self, Place  Normal Continent Weight: 177 lb 14.6 oz (80.7 kg) Height:  5\' 6"  (167.6 cm)  BEHAVIORAL SYMPTOMS/MOOD NEUROLOGICAL BOWEL NUTRITION STATUS      Continent Diet Clear Liquid  AMBULATORY STATUS COMMUNICATION OF NEEDS Skin   Limited Assist Verbally Normal                       Personal Care Assistance Level of Assistance  Bathing, Feeding, Dressing Bathing Assistance: Limited assistance Feeding assistance: Limited assistance Dressing Assistance: Limited assistance     Functional Limitations Info  Sight, Hearing, Speech Sight Info:  Adequate Hearing Info: Adequate Speech Info: Adequate    SPECIAL CARE FACTORS FREQUENCY  PT (By licensed PT)     PT Frequency: 5x a week              Contractures Contractures Info: Not present    Additional Factors Info  Code Status, Allergies, Insulin Sliding Scale Code Status Info: Partial Code Allergies Info: ASPIRIN, BC POWDER ASPIRIN-SALICYLAMIDE-CAFFEINE, PENICILLINS   Insulin Sliding Scale Info: insulin aspart (novoLOG) injection 0-9 Units 3x a day with meals       Current Medications (09/26/2016):  This is the current hospital active medication list Current Facility-Administered Medications  Medication Dose Route Frequency Provider Last Rate Last Dose  . acetaminophen (TYLENOL) tablet 650 mg  650 mg Oral Q6H PRN Saundra Shelling, MD       Or  . acetaminophen (TYLENOL) suppository 650 mg  650 mg Rectal Q6H PRN Pyreddy, Pavan, MD      . atorvastatin (LIPITOR) tablet 20 mg  20 mg Oral q1800 Pyreddy, Reatha Harps, MD   20 mg at 09/25/16 1756  . heparin injection 5,000 Units  5,000 Units Subcutaneous Q8H Max Sane, MD   5,000 Units at 09/26/16 1447  . insulin aspart (novoLOG) injection 0-5 Units  0-5 Units Subcutaneous QHS Flora Lipps, MD   2 Units at 09/25/16 2223  . insulin aspart (novoLOG) injection 0-9 Units  0-9 Units Subcutaneous TID WC Flora Lipps, MD   2 Units at 09/26/16 1200  . insulin detemir (LEVEMIR) injection 5 Units  5 Units Subcutaneous Daily Max Sane, MD   5  Units at 09/26/16 1448  . ipratropium-albuterol (DUONEB) 0.5-2.5 (3) MG/3ML nebulizer solution 3 mL  3 mL Nebulization Q6H Laverle Hobby, MD   3 mL at 09/26/16 1359  . lamoTRIgine (LAMICTAL) tablet 100 mg  100 mg Oral QHS Saundra Shelling, MD   100 mg at 09/25/16 2223  . levETIRAcetam (KEPPRA) 100 MG/ML solution 500 mg  500 mg Oral BID Max Sane, MD   500 mg at 09/26/16 0835  . meropenem (MERREM) IVPB SOLR 500 mg  500 mg Intravenous Q24H Merilyn Baba, [place]   Stopped at 09/26/16 1232  .  midodrine (PROAMATINE) tablet 5 mg  5 mg Oral TID WC Pyreddy, Reatha Harps, MD   5 mg at 09/26/16 1202  . ondansetron (ZOFRAN) tablet 4 mg  4 mg Oral Q6H PRN Saundra Shelling, MD       Or  . ondansetron (ZOFRAN) injection 4 mg  4 mg Intravenous Q6H PRN Pyreddy, Pavan, MD      . pantoprazole sodium (PROTONIX) 40 mg/20 mL oral suspension 40 mg  40 mg Oral Daily Max Sane, MD   40 mg at 09/26/16 1201  . senna-docusate (Senokot-S) tablet 1 tablet  1 tablet Oral QHS PRN Pyreddy, Reatha Harps, MD      . sevelamer carbonate (RENVELA) packet 0.8 g  0.8 g Oral TID WC Pyreddy, Pavan, MD   0.8 g at 09/26/16 1242     Discharge Medications: Please see discharge summary for a list of discharge medications.  Relevant Imaging Results:  Relevant Lab Results:   Additional Information SSN [id number]  Ross Ludwig, [place]

## 2016-09-26 NOTE — Progress Notes (Signed)
Patient ID: Michael Acosta, male   DOB: October 11, 1929, 83 y.o.   MRN: [id number]  This NP visited patient at the bedside as a follow up to  yesterday's [name].  Family (wife and daughter) are expressing dissatisfaction with current care facility.  They are requesting assistance from SW to  access a different  Facility.  They wonder about rehab potential.       -will write for PT evaluation       -will write for SW consult Family currently are paying out of pocket for long term care.  Discussed with patient and family  the importance of continued conversation with each other and their  medical providers regarding overall plan of care and treatment options,  ensuring decisions are within the context of the patients values and GOCs.  I shared my concern for high risk for decompensation 2/2 to multiple co-morbidities    Questions and concerns addressed  Discussed with Dr Manuella Ghazi    Time in   1045        Time out    1105   Total time spent on the unit was 20 min  Greater than 50% of the time was spent in counseling and coordination of care  Wadie Lessen NP  Palliative Medicine Team Team Phone # [phone] Pager [phone]

## 2016-09-26 NOTE — Progress Notes (Signed)
Patient Name: Michael Acosta Date of Encounter: 09/26/2016  Primary Cardiologist: [facility] Problem List     Principal Problem:   Septic shock [facility] Active Problems:   CAP (community acquired pneumonia)   Cholecystitis   Elevated troponin   ESRD on hemodialysis ([place])   Dementia   IDDM (insulin dependent diabetes mellitus) ([place])   Bacteremia   PVD (peripheral vascular disease) ([place])   DNR (do not resuscitate) discussion   Palliative care by specialist   Sepsis ([place])     Subjective   No acute overnight events. Troponin now down trending from a peak of 1.45. On heparin gtt. Echo showed preserved LVSF with normal wall motion. No obvious valvular vegetation. Renal function improving. ID on board given E coli bacteremia. Felt to be a poor surgical candidate by general surgery.   Inpatient Medications    Scheduled Meds: . atorvastatin  20 mg Oral q1800  . insulin aspart  0-5 Units Subcutaneous QHS  . insulin aspart  0-9 Units Subcutaneous TID WC  . ipratropium-albuterol  3 mL Nebulization Q6H  . lamoTRIgine  100 mg Oral QHS  . levETIRAcetam  500 mg Oral BID  . midodrine  5 mg Oral TID WC  . pantoprazole sodium  40 mg Oral Daily  . sevelamer carbonate  0.8 g Oral TID WC   Continuous Infusions: . heparin 1,050 Units/hr (09/26/16 0323)  . meropenem     PRN Meds: acetaminophen **OR** acetaminophen, ondansetron **OR** ondansetron (ZOFRAN) IV, senna-docusate   Vital Signs    Vitals:   09/25/16 1629 09/25/16 2006 09/25/16 2014 09/26/16 0318  BP:  (!) 91/45  (!) 100/44  Pulse:  88  99  Resp:  16  18  Temp:  98.6 F (37 C)  98.6 F (37 C)  TempSrc:  Oral  Oral  SpO2: 97% 93% 95% 92%  Weight:      Height:        Intake/Output Summary (Last 24 hours) at 09/26/16 0713 Last data filed at 09/26/16 0500  Gross per 24 hour  Intake            15.75 ml  Output             -380 ml  Net           395.75 ml   Filed Weights   09/24/16 0449 09/25/16 0920 09/25/16  1230  Weight: 168 lb (76.2 kg) 177 lb 14.6 oz (80.7 kg) 177 lb 14.6 oz (80.7 kg)    Physical Exam    GEN: Well nourished, well developed, in no acute distress.  HEENT: Grossly normal.  Neck: Supple, JVD elevated ~ 12 cm, no carotid bruits, or masses. Cardiac: RRR, II/VI systolic mumur, no rubs, or gallops. No clubbing, cyanosis, edema.  Radials/DP/PT 2+ and equal bilaterally.  Respiratory:  Bibasilar crackles. GI: Soft, nontender, nondistended, BS + x 4. MS: No deformity or atrophy. Skin: warm and dry, no rash. Neuro:  Strength and sensation are intact. Psych: Alert.  Normal affect.  Labs    CBC  Recent Labs  09/24/16 0638 09/25/16 0452 09/26/16 0207  WBC 21.1* 14.8* 8.9  NEUTROABS 19.9*  --   --   HGB 11.0* 11.1* 8.9*  HCT 33.8* 34.3* 27.3*  MCV 93.1 95.7 94.4  PLT 196 193 725   Basic Metabolic Panel  Recent Labs  09/25/16 0452 09/25/16 0959 09/26/16 0207  NA 140  --  138  K 5.0  --  4.0  CL 100*  --  98*  CO2 27  --  31  GLUCOSE 191*  --  247*  BUN 70*  --  44*  CREATININE 6.96*  --  4.75*  CALCIUM 8.7*  --  8.2*  MG  --  1.8  --    Liver Function Tests  Recent Labs  09/25/16 0452 09/26/16 0207  AST 139* 79*  ALT 156* 95*  ALKPHOS 212* 185*  BILITOT 1.6* 0.8  PROT 7.8 6.6  ALBUMIN 3.4* 2.9*    Recent Labs  09/25/16 0452 09/26/16 0207  LIPASE 1,189* 1,444*  AMYLASE 1,645* 1,312*   Cardiac Enzymes  Recent Labs  09/25/16 0959 09/25/16 1808 09/25/16 2323  TROPONINI 1.21* 0.99* 0.79*   BNP Invalid input(s): POCBNP D-Dimer No results for input(s): DDIMER in the last 72 hours. Hemoglobin A1C No results for input(s): HGBA1C in the last 72 hours. Fasting Lipid Panel  Recent Labs  09/25/16 0452  TRIG 97   Thyroid Function Tests No results for input(s): TSH, T4TOTAL, T3FREE, THYROIDAB in the last 72 hours.  Invalid input(s): FREET3  Telemetry    NSR, 70s bpm, occasional PACs and PVCs - Personally Reviewed  ECG    n/a -  Personally Reviewed  Radiology    US Abdomen Limited Ruq  Result Date: 09/24/2016 CLINICAL DATA:  83 year old male with hepatitis.  Initial encounter. EXAM: ULTRASOUND ABDOMEN LIMITED RIGHT UPPER QUADRANT COMPARISON:  03/16/2013 CT. FINDINGS: Gallbladder: Multiple mobile gallstones. Gallbladder wall thickening measuring up to 5.5 mm. Tiny amount of pericholecystic fluid. Patient was not tender over this region during scanning per ultrasound technologist. Common bile duct: Diameter: 4.2 mm. Liver: No dominant mass. Portal triads slightly echogenic. Tiny liver calcification. Echogenic right kidney. IMPRESSION: Gallstones. Gallbladder wall thickening and tiny amount of pericholecystic fluid may be related to hepatitis as patient was not tender over this region during scanning per ultrasound technologist. Cholecystitis could not be excluded in proper clinical setting. Electronically Signed   By: Genia Del M.D.   On: 09/24/2016 14:04    Cardiac Studies   TTE 09/25/2016: Study Conclusions  - Technically difficult study due to chest wall and/or lung   interference. - Left ventricle: The cavity size was normal. Wall thickness was   increased in a pattern of mild LVH. There was moderate focal   basal hypertrophy. Systolic function was normal. The estimated   ejection fraction was in the range of 60% to 65%. Doppler   parameters are consistent with abnormal left ventricular   relaxation (grade 1 diastolic dysfunction). - Aortic valve: Probably trileaflet; moderately thickened, mildly   calcified leaflets. Sclerosis without stenosis. There was no   significant regurgitation. - Mitral valve: Calcified annulus. Mildly thickened leaflets .   There was trivial regurgitation. - Left atrium: The atrium was mildly dilated. - Right ventricle: The cavity size was normal. Systolic function   was normal. - There is no definite evidence of endocarditis, though native   valvular disease is present.  Evaluation of the right-sided   cardiac valves is also limited due to poor accoustic windows.  Patient Profile     83 y.o. male with history of ESRD on HD (MWF), dementia, IDDM with diabetic neuropathy and prior ulcerations, PVD s/p left popliteal (mid) to proximal peronealbypass with cadaveric vein, Apr 23, 2012 - Angioplasty of proximal bypass and of the left popliteal artery. June 17, 2010: Right BK popliteal to peroneal artery bypass with RSVG, prior CVA, HTN, HLD, frequent falls, anemia of chronic disease,  prostate cancer, and mood disorder who presented to [facility] on 6/12 with fever with Tmax 104 and emesis. He was found to have acute respiratory failure with hypoxia requiring NRB 2/2 LLL PNA as well as septic shock 2/2 the above along with acute cholecystitis and E coli bacteremia. Troponin peaked at 1.45. Echo with normal LVSF and wall motion without obvious evidence of valvular vegetation.   Assessment & Plan    1. Elevated troponin: -Denies chest pain (has dementia) -Troponin peak to 1.45 on 6/13, now down tredning -Discontinue heparin gtt at this time given preserved EF on echo and down trending hgb (possibly dilutional) -TTE with preserved LVSF and normal wall motion -Possibly supply demand ischemia in the setting of #2 as well as ESRD vs NSTEMI in the setting of #2 -Not on beta blocker at this time given recent hypotension, start when able -Would need to discuss further ischemia evaluation with patient and family after his acute illness is improved, consider outpatient Myoview to evaluate for high-risk ischemia -No indication for urgent LHC at this time given the below as well as his underlying dementia -ASA allergy noted   2. Septic shock/bacteremia/CAP/acute cholecystitis: -Secondary to the above (#2) -BP stable -Off Dopamine -On meropenem and vancomycin per IM/PCCM -E coli bacteremia, ID on board -Maintain MAP > 65 mmHg -No obvious valvular vegetations on TTE, if indicated  may need TEE to further evaluate valves given his bacteremia   3. ESRD on HD: -Volume management per HD -Per Renal  4. PVD: -Followed by [facility]  5. IDDM: -Per IM  6. Dementia: -Per IM  Signed, Christell Faith, PA-C [place] Pager: [phone] 09/26/2016, 7:13 AM  Attending Note Patient seen and examined, agree with detailed note above,  Patient presentation and plan discussed on rounds.   Sitting up in bed eating breakfast, eating on his own, having moderate difficulty getting food into his mouth Denies any new issues, feels weak  Off pressors, blood pressure stable though still low Escherichia coli bacteremia on blood cultures  Covered with broad-spectrum antibiotics Troponin trending downward Nonsurgical candidate for his gallbladder  On physical exam, no patches stress, heart sounds regular with no murmurs appreciated, lungs with rales at the bases, abdomen soft nontender, no significant lower extremity edema  Lab work reviewed showing amylase lipase 1314 100 respectively, creatinine 4.75, troponin trending down 0.79  ----Escherichia coli bacteremia/sepsis Blood pressure stable, on broad-spectrum antibiotics Possible gallstone pancreatitis, markedly elevated amylase lipase  --- Elevated troponin Normal ejection fraction on echo Secondary to demand ischemia, hypertension/sepsis   Greater than 50% was spent in counseling and coordination of care with patient Total encounter time 25 minutes or more   Signed: Esmond Plants  M.D., Ph.D. [facility]

## 2016-09-26 NOTE — Progress Notes (Signed)
New referral for Palliative services to follow at [facility] received from Palliative medicine NP Wadie Lessen. CSW Evette Cristal made aware. Patient information faxed to referral. Thank you. Flo Shanks RN, BSN, [facility] [place], hospital liaison [phone] c

## 2016-09-26 NOTE — Care Management (Signed)
Chronic hemodialysis patient. Michael Acosta with [facility] is aware of admission.  Palliative is involved. There is discussion regarding whether it is thought patient has rehab potential and physical therapy consult is pending.  Family may wish to see if patient can obtain a bed at [facility] as it is closer to wife's home.  Patient has been at  [facility] under long term care for the past 4 years. CSW is involved

## 2016-09-26 NOTE — Clinical Social Work Note (Signed)
CSW contacted patient's wife Michael Acosta [phone] to discuss SNF placement.  Patient is from [place] and has been living there for about 4 years.  CSW was informed that patient's family would like patient to move to a different SNF preferably [facility] [place] due to patient's wife living in [place].  Patient's wife was informed by this CSW that [facility] does short term rehab, but does not always have long term care beds available.  [facility] contacted [facility] and they are requesting therapy notes to be faxed to SNF once PT has worked with patient.  CSW was given permission to begin bed search by patient's wife.  CSW to continue to follow patient's progress throughout discharge planning.  Jones Broom. [place], MSW, [place]  09/26/2016 5:29 PM

## 2016-09-27 LAB — BASIC METABOLIC PANEL
ANION GAP: 11 (ref 5–15)
BUN: 61 mg/dL — AB (ref 6–20)
CO2: 30 mmol/L (ref 22–32)
Calcium: 8.3 mg/dL — ABNORMAL LOW (ref 8.9–10.3)
Chloride: 98 mmol/L — ABNORMAL LOW (ref 101–111)
Creatinine, Ser: 6.54 mg/dL — ABNORMAL HIGH (ref 0.61–1.24)
GFR, EST AFRICAN AMERICAN: 8 mL/min — AB (ref >60)
GFR, EST NON AFRICAN AMERICAN: 7 mL/min — AB (ref >60)
Glucose, Bld: 189 mg/dL — ABNORMAL HIGH (ref 65–99)
POTASSIUM: 4.2 mmol/L (ref 3.5–5.1)
SODIUM: 139 mmol/L (ref 135–145)

## 2016-09-27 LAB — CBC
HCT: 26.7 % — ABNORMAL LOW (ref 40.0–52.0)
HEMOGLOBIN: 9 g/dL — AB (ref 13.0–18.0)
MCH: 31.7 pg (ref 26.0–34.0)
MCHC: 33.7 g/dL (ref 32.0–36.0)
MCV: 94 fL (ref 80.0–100.0)
PLATELETS: 166 10*3/uL (ref 150–440)
RBC: 2.84 MIL/uL — AB (ref 4.40–5.90)
RDW: 15.7 % — ABNORMAL HIGH (ref 11.5–14.5)
WBC: 6.9 10*3/uL (ref 3.8–10.6)

## 2016-09-27 LAB — CULTURE, BLOOD (ROUTINE X 2): SPECIAL REQUESTS: ADEQUATE

## 2016-09-27 LAB — GLUCOSE, CAPILLARY
GLUCOSE-CAPILLARY: 152 mg/dL — AB (ref 65–99)
GLUCOSE-CAPILLARY: 165 mg/dL — AB (ref 65–99)
Glucose-Capillary: 238 mg/dL — ABNORMAL HIGH (ref 65–99)

## 2016-09-27 MED ORDER — EPOETIN ALFA 10000 UNIT/ML IJ SOLN
10000.0000 [IU] | INTRAMUSCULAR | Status: DC
  Administered 2016-09-27: 10000 [IU] via INTRAVENOUS

## 2016-09-27 MED ORDER — IPRATROPIUM-ALBUTEROL 0.5-2.5 (3) MG/3ML IN SOLN
3.0000 mL | Freq: Two times a day (BID) | RESPIRATORY_TRACT | Status: DC
  Administered 2016-09-28 (×2): 3 mL via RESPIRATORY_TRACT
  Filled 2016-09-27 (×2): qty 3

## 2016-09-27 MED ORDER — CEFAZOLIN SODIUM-DEXTROSE 1-4 GM/50ML-% IV SOLN
1.0000 g | INTRAVENOUS | Status: DC
  Administered 2016-09-27 – 2016-09-28 (×2): 1 g via INTRAVENOUS
  Filled 2016-09-27 (×3): qty 50

## 2016-09-27 NOTE — Progress Notes (Signed)
This note also relates to the following rows which could not be included: Pulse Rate - Cannot attach notes to unvalidated device data Resp - Cannot attach notes to unvalidated device data BP - Cannot attach notes to unvalidated device data SpO2 - Cannot attach notes to unvalidated device data  HD STARTED  

## 2016-09-27 NOTE — Progress Notes (Signed)
CC: Sepsis Subjective: Patient reports doing very well. He denies any abdominal pain. He is eating well. Currently on a heparin drip secondary to elevated troponin. Receiving dialysis per nephrology.  Objective: Vital signs in last 24 hours: Temp:  [97.7 F (36.5 C)-98.3 F (36.8 C)] 98.1 F (36.7 C) (06/15 0625) Pulse Rate:  [75-84] 75 (06/15 0625) Resp:  [20] 20 (06/14 2002) BP: (110-119)/(52-63) 119/58 (06/15 0625) SpO2:  [90 %-98 %] 93 % (06/15 0832) Weight:  [88 kg (194 lb)] 88 kg (194 lb) (06/15 0619) Last BM Date: 09/27/16  Intake/Output from previous day: 06/14 0701 - 06/15 0700 In: 240 [P.O.:240] Out: 451 [Urine:450; Stool:1] Intake/Output this shift: Total I/O In: 120 [P.O.:120] Out: -   Physical exam:  Gen.: No acute distress Chest: Clear to auscultation Heart: Regular rhythm Abdomen: Soft, nontender, nondistended  Lab Results: CBC   Recent Labs  09/26/16 0207 09/27/16 0450  WBC 8.9 6.9  HGB 8.9* 9.0*  HCT 27.3* 26.7*  PLT 172 166   BMET  Recent Labs  09/26/16 0207 09/27/16 0450  NA 138 139  K 4.0 4.2  CL 98* 98*  CO2 31 30  GLUCOSE 247* 189*  BUN 44* 61*  CREATININE 4.75* 6.54*  CALCIUM 8.2* 8.3*   PT/INR  Recent Labs  09/25/16 0452  LABPROT 14.9  INR 1.16   ABG No results for input(s): PHART, HCO3 in the last 72 hours.  Invalid input(s): PCO2, PO2  Studies/Results: No results found.  Anti-infectives: Anti-infectives    Start     Dose/Rate Route Frequency Ordered Stop   09/26/16 1000  meropenem (MERREM) IVPB SOLR 500 mg     500 mg 100 mL/hr over 30 Minutes Intravenous Every 24 hours 09/25/16 1209     09/25/16 1800  meropenem (MERREM) IVPB SOLR 500 mg  Status:  Discontinued     500 mg 100 mL/hr over 30 Minutes Intravenous Every 24 hours 09/25/16 0751 09/25/16 1209   09/25/16 0900  meropenem (MERREM) 500 mg in sodium chloride 0.9 % 50 mL IVPB  Status:  Discontinued     500 mg 100 mL/hr over 30 Minutes Intravenous  Once  09/25/16 0847 09/25/16 0854   09/25/16 0900  meropenem (MERREM) IVPB SOLR 500 mg     500 mg 100 mL/hr over 30 Minutes Intravenous  Once 09/25/16 0854 09/25/16 0945   09/24/16 1800  meropenem (MERREM) 500 mg in sodium chloride 0.9 % 50 mL IVPB  Status:  Discontinued     500 mg 100 mL/hr over 30 Minutes Intravenous Every 24 hours 09/24/16 1136 09/25/16 0751   09/24/16 0630  vancomycin (VANCOCIN) 1,750 mg in sodium chloride 0.9 % 500 mL IVPB     1,750 mg 250 mL/hr over 120 Minutes Intravenous  Once 09/24/16 0615 09/24/16 1328   09/24/16 0623  vancomycin (VANCOCIN) IVPB 750 mg/150 ml premix  Status:  Discontinued     750 mg 150 mL/hr over 60 Minutes Intravenous Every Dialysis 09/24/16 0623 09/25/16 1017   09/24/16 0615  aztreonam (AZACTAM) 2 g in dextrose 5 % 50 mL IVPB     2 g 100 mL/hr over 30 Minutes Intravenous  Once 09/24/16 0605 09/24/16 0824   09/24/16 0615  vancomycin (VANCOCIN) IVPB 1000 mg/200 mL premix  Status:  Discontinued     1,000 mg 200 mL/hr over 60 Minutes Intravenous  Once 09/24/16 7517 09/24/16 0615      Assessment/Plan:  83 year old male with a multifactorial sepsis. Concern for gallstone pancreatitis on admission. Patient  is completely symptom free from an abdominal standpoint. No plans for any surgical intervention. Please call again if general surgery be of any further assistance with this patient.  Carvel Huskins T. Adonis Huguenin, MD, [facility] General Surgeon [facility]  Day [facility] [phone] Night [facility] [phone] 09/27/2016

## 2016-09-27 NOTE — Progress Notes (Signed)
Patient will be having dialysis today, most likely within the next hour according to Dr. Juleen China.  Patient resting quietly at this time.

## 2016-09-27 NOTE — Progress Notes (Signed)
PT Cancellation Note  Patient Details Name: ANWAR SAKATA MRN: [id number] DOB: Apr 24, 1929   Cancelled Treatment:    Reason Eval/Treat Not Completed: Other (comment): Order received and chart reviewed. Evaluation attempted this AM, but pt not available due to dialysis treatment. Will re-attempt evaluation tomorrow.   Donaciano Eva, PT, SPT  [place] 09/27/2016, 10:11 AM

## 2016-09-27 NOTE — Progress Notes (Signed)
Hd completed 

## 2016-09-27 NOTE — Progress Notes (Signed)
Patient refused for me to removed depends while lying in bed.

## 2016-09-27 NOTE — Progress Notes (Signed)
Post dialysis assessment 

## 2016-09-27 NOTE — Clinical Social Work Note (Signed)
Clinical Social Work Assessment  Patient Details  Name: Michael Acosta MRN: [id number] Date of Birth: 09/30/1929  Date of referral:  09/26/16               Reason for consult:  Facility Placement                Permission sought to share information with:  Family Supports Permission granted to share information::  Yes, Verbal Permission Granted  Name::     Michael Acosta,Michael Acosta Daughter [phone] or Michael Acosta, Michael Acosta [phone]   Agency::  SNF admissions  Relationship::     Contact Information:     Housing/Transportation Living arrangements for the past 2 months:  [place] of Information:  Spouse Patient Interpreter Needed:  None Criminal Activity/Legal Involvement Pertinent to Current Situation/Hospitalization:  No - Comment as needed Significant Relationships:    Lives with:  Adult Children, Spouse Do you feel safe going back to the place where you live?  No Need for family participation in patient care:  Yes (Comment)  Care giving concerns:  Patient has been at SNF for about 4 years, and they have been pleased with care, however they would like a location closer to [place], where wife lives.   Social Worker assessment / plan:  Patient is an 83 year old male who is married and has been a long term care resident at [facility].  Patient's family expressed that they have been pleased with the care at SNF, however they would like him closer to his wife.  Patient has some dementia, CSW completed assessment by speaking to patient's wife.  Patient's wife was explained role of CSW and process for finding a different placement for patient.  CSW explained to patient's wife how insurance will pay for stay.  Patient's wife gave CSW permission to begin bed search in [place].  Employment status:  Retired Forensic scientist:  Medicare PT Recommendations:  [place] / Referral to community resources:  [place]  Patient/Family's Response to care:  Patient in agreement to going to SNF for short term rehab.  Patient/Family's Understanding of and Emotional Response to Diagnosis, Current Treatment, and Prognosis:  Patient's family are looking forward to having patient closer to where they live.  Emotional Assessment Appearance:  Appears stated age Attitude/Demeanor/Rapport:    Affect (typically observed):  Appropriate, Calm Orientation:  Oriented to Place, Oriented to  Time, Oriented to Situation, Oriented to Self Alcohol / Substance use:  Not Applicable Psych involvement (Current and /or in the community):  No (Comment)  Discharge Needs  Concerns to be addressed:  Lack of Support Readmission within the last 30 days:  No Current discharge risk:  Lack of support system Barriers to Discharge:  Continued Medical Work up   Anell Barr 09/27/2016, 5:16 PM

## 2016-09-27 NOTE — Progress Notes (Signed)
[facility] [place] NAME: Michael Acosta    MR#:  [id number]  DATE OF BIRTH:  February 25, 1930  SUBJECTIVE:  No new complaints, Doing better REVIEW OF SYSTEMS:   Review of Systems  Constitutional: Positive for malaise/fatigue. Negative for chills, fever and weight loss.  HENT: Negative for nosebleeds and sore throat.   Eyes: Negative for blurred vision.  Respiratory: Negative for cough, shortness of breath and wheezing.   Cardiovascular: Negative for chest pain, orthopnea, leg swelling and PND.  Gastrointestinal: Negative for abdominal pain, constipation, diarrhea, heartburn, nausea and vomiting.  Genitourinary: Negative for dysuria and urgency.  Musculoskeletal: Negative for back pain.  Skin: Negative for rash.  Neurological: Positive for weakness. Negative for dizziness, speech change, focal weakness and headaches.  Endo/Heme/Allergies: Does not bruise/bleed easily.  Psychiatric/Behavioral: Negative for depression.   Tolerating Diet: Yes Tolerating PT: yes  DRUG ALLERGIES:   Allergies  Allergen Reactions  . Aspirin   . Bc Powder [Aspirin-Salicylamide-Caffeine]   . Penicillins Other (See Comments)    Has patient had a PCN reaction causing immediate rash, facial/tongue/throat swelling, SOB or lightheadedness with hypotension: Unknown Has patient had a PCN reaction causing severe rash involving mucus membranes or skin necrosis: Unknown Has patient had a PCN reaction that required hospitalization: Unknown Has patient had a PCN reaction occurring within the last 10 years: Unknown If all of the above answers are "NO", then may proceed with Cephalosporin use.  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    VITALS:  Blood pressure (!) 119/58, pulse 75, temperature 98.1 F (36.7 C), temperature source Oral, resp. rate 20, height 5\' 6"  (1.676 m), weight 88 kg (194 lb), SpO2 93 %. PHYSICAL EXAMINATION:  Physical Exam  GENERAL:  83 y.o.-year-old patient lying in  the bed with no acute distress.  EYES: Pupils equal, round, reactive to light and accommodation. No scleral icterus. Extraocular muscles intact.  HEENT: Head atraumatic, normocephalic. Oropharynx and nasopharynx clear.  NECK:  Supple, no jugular venous distention. No thyroid enlargement, no tenderness.  LUNGS: Normal breath sounds bilaterally, no wheezing, rales, rhonchi. No use of accessory muscles of respiration.  CARDIOVASCULAR: S1, S2 normal. No murmurs, rubs, or gallops.  ABDOMEN: Soft, nontender, nondistended. Bowel sounds present. No organomegaly or mass.  EXTREMITIES: No cyanosis, clubbing or edema b/l.    NEUROLOGIC: Cranial nerves II through XII are intact. No focal Motor or sensory deficits b/l.   PSYCHIATRIC:  patient is sleepy SKIN: No obvious rash, lesion, or ulcer.  LABORATORY PANEL:  CBC  Recent Labs Lab 09/27/16 0450  WBC 6.9  HGB 9.0*  HCT 26.7*  PLT 166    Chemistries   Recent Labs Lab 09/25/16 0959 09/26/16 0207 09/27/16 0450  NA  --  138 139  K  --  4.0 4.2  CL  --  98* 98*  CO2  --  31 30  GLUCOSE  --  247* 189*  BUN  --  44* 61*  CREATININE  --  4.75* 6.54*  CALCIUM  --  8.2* 8.3*  MG 1.8  --   --   AST  --  79*  --   ALT  --  95*  --   ALKPHOS  --  185*  --   BILITOT  --  0.8  --    Cardiac Enzymes  Recent Labs Lab 09/25/16 2323  TROPONINI 0.79*   RADIOLOGY:  No results found. ASSESSMENT AND PLAN:  Michael Acosta  is a 83 y.o. male  with a known history of End-stage renal disease on dialysis, dementia, diabetes mellitus, GERD, CVA, hypertension, neuropathy is a resident of [facility]. Patient was transferred from the facility for vomiting and fever of 10 81F. When patient was evaluated by the EMS his oxygen saturation was 72% on room air and he was put on nonrebreather mask at 100% and his oxygen saturation improved 100% on 10 L by nonrebreather mask  1. Escherichia coli Sepsis multifactorial pneumonia/gallbladder  disease/cholecystitis - Change to IV Ancef and may need 14 days course of PO keflex. - Blood cultures growing Escherichia coli from 6/12   2. Acute hypoxic respiratory failure secondary to lower lobe pneumonia - on room air now  3. Hypotension/septic shock Now resolved  4. Acute pancreatitis/elevated transaminases: improving except lipase, recheck in am -Suspected due to gallstone pancreatitis given the abnormal ultrasound of the gallbladder -Patient not tender in the right upper quadrant however ultrasound shows evidence of possible acute cholecystitis - not a surgical candidate, if anything may be IR guided drainage per surgery -Consider CT abd/pelvis if symptoms/sign worsen.  5. End stage renal disease on hemodialysis - HD per nephro  6. Leukocytosis due to #1 and 4 - improving with abx  7.  Elevated troponin:  troponin now down trending from a peak of 1.45. On heparin gtt. Echo showed preserved LVSF with normal wall motion.  Palliative care c/s -likely outpatient palliative care follow-up at [place] discussed with Care Management/Social Worker. Management plans discussed with the patient, nursing, dr Juleen China, ID and they are in agreement.    CODE STATUS: Partial  DVT Prophylaxis: Heparin  TOTAL CRITICAL TIME TAKING CARE OF THIS PATIENT: 30 minutes.  >50% time spent on counselling and coordination of care  POSSIBLE D/C IN 1-2 DAYS, DEPENDING ON CLINICAL CONDITION.  Note: This dictation was prepared with Dragon dictation along with smaller phrase technology. Any transcriptional errors that result from this process are unintentional.  Max Sane M.D on 09/27/2016 at 10:36 AM  Between 7am to 6pm - Pager - [phone]  After 6pm go to [email or web address] - password [email or web address] [place] [facility]  Office  [phone]  CC: Primary care physician; Marden Noble, MD

## 2016-09-27 NOTE — Progress Notes (Signed)
PRE DIALYSIS ASSESSMENT 

## 2016-09-27 NOTE — Care Management Important Message (Signed)
Important Message  Patient Details  Name: Michael Acosta MRN: [id number] Date of Birth: [birth year]   Medicare Important Message Given:  Yes Signed IM notice given   Katrina Stack, RN 09/27/2016, 4:24 PM

## 2016-09-27 NOTE — Clinical Social Work Note (Signed)
CSW presented bed offers to patient's wife and she chose [facility] [place].  CSW contacted [facility] and can accept patient once he is medically ready for discharge and orders have been received.  Jones Broom. [place], MSW, [place]  09/27/2016 5:28 PM

## 2016-09-27 NOTE — Progress Notes (Signed)
[facility]  ROUNDING NOTE   Subjective:   Seen and examined on hemodialysis.     HEMODIALYSIS FLOWSHEET:  Blood Flow Rate (mL/min): 400 mL/min Arterial Pressure (mmHg): -170 mmHg Venous Pressure (mmHg): 180 mmHg Transmembrane Pressure (mmHg): 50 mmHg Ultrafiltration Rate (mL/min): 330 mL/min Dialysate Flow Rate (mL/min): 600 ml/min Conductivity: Machine : 15.4 Conductivity: Machine : 15.4 Dialysis Fluid Bolus: Normal Saline Bolus Amount (mL): 250 mL Dialysate Change: Other (comment) (3K)   Objective:  Vital signs in last 24 hours:  Temp:  [97.7 F (36.5 C)-98.4 F (36.9 C)] 98.4 F (36.9 C) (06/15 1115) Pulse Rate:  [74-81] 80 (06/15 1230) Resp:  [16-20] 16 (06/15 1230) BP: (119-142)/(52-111) 125/111 (06/15 1230) SpO2:  [93 %-98 %] 96 % (06/15 1230) Weight:  [68.3 kg (150 lb 9.2 oz)-88 kg (194 lb)] 68.3 kg (150 lb 9.2 oz) (06/15 1115)  Weight change: 7.298 kg (16 lb 1.4 oz) Filed Weights   09/25/16 1230 09/27/16 0619 09/27/16 1115  Weight: 80.7 kg (177 lb 14.6 oz) 88 kg (194 lb) 68.3 kg (150 lb 9.2 oz)    Intake/Output: I/O last 3 completed shifts: In: 240 [P.O.:240] Out: 451 [Urine:450; Stool:1]   Intake/Output this shift:  Total I/O In: 120 [P.O.:120] Out: -   Physical Exam: General: Ill appearing  Head: nonrebreather  Eyes: Anicteric, PERRL  Neck: Supple, trachea midline  Lungs:  Clear to auscultation  Heart: Regular rate and rhythm  Abdomen:  Soft, nontender  Extremities: no peripheral edema.  Neurologic: Nonfocal, moving all four extremities  Skin: No lesions  Access: Left AVF    Basic Metabolic Panel:  Recent Labs Lab 09/24/16 0638 09/25/16 0452 09/25/16 0959 09/26/16 0207 09/27/16 0450  NA 142 140  --  138 139  K 4.2 5.0  --  4.0 4.2  CL 95* 100*  --  98* 98*  CO2 31 27  --  31 30  GLUCOSE 206* 191*  --  247* 189*  BUN 48* 70*  --  44* 61*  CREATININE 5.73* 6.96*  --  4.75* 6.54*  CALCIUM 9.4 8.7*  --  8.2* 8.3*   MG  --   --  1.8  --   --     Liver Function Tests:  Recent Labs Lab 09/24/16 0638 09/25/16 0452 09/26/16 0207  AST 243* 139* 79*  ALT 198* 156* 95*  ALKPHOS 258* 212* 185*  BILITOT 2.5* 1.6* 0.8  PROT 8.3* 7.8 6.6  ALBUMIN 3.7 3.4* 2.9*    Recent Labs Lab 09/24/16 0638 09/25/16 0452 09/26/16 0207  LIPASE 2,491* 1,189* 1,444*  AMYLASE  --  1,645* 1,312*   No results for input(s): AMMONIA in the last 168 hours.  CBC:  Recent Labs Lab 09/24/16 0638 09/25/16 0452 09/26/16 0207 09/27/16 0450  WBC 21.1* 14.8* 8.9 6.9  NEUTROABS 19.9*  --   --   --   HGB 11.0* 11.1* 8.9* 9.0*  HCT 33.8* 34.3* 27.3* 26.7*  MCV 93.1 95.7 94.4 94.0  PLT 196 193 172 166    Cardiac Enzymes:  Recent Labs Lab 09/24/16 2259 09/25/16 0452 09/25/16 0959 09/25/16 1808 09/25/16 2323  TROPONINI 0.77* 1.45* 1.21* 0.99* 0.79*    BNP: Invalid input(s): POCBNP  CBG:  Recent Labs Lab 09/26/16 0753 09/26/16 1157 09/26/16 1607 09/26/16 2124 09/27/16 0728  GLUCAP 183* 208* 156* 196* 152*    Microbiology: Results for orders placed or performed during the hospital encounter of 09/24/16  Blood Culture (routine x 2)  Status: None (Preliminary result)   Collection Time: 09/24/16  6:20 AM  Result Value Ref Range Status   Specimen Description BLOOD ARTERIAL DRAW FROM R ARM  Final   Special Requests BOTTLES DRAWN AEROBIC AND ANAEROBIC BCAV  Final   Culture NO GROWTH 3 DAYS  Final   Report Status PENDING  Incomplete  Blood Culture (routine x 2)     Status: Abnormal   Collection Time: 09/24/16  6:32 AM  Result Value Ref Range Status   Specimen Description BLOOD R HAND  Final   Special Requests   Final    BOTTLES DRAWN AEROBIC AND ANAEROBIC Blood Culture adequate volume   Culture  Setup Time   Final    GRAM NEGATIVE RODS AEROBIC BOTTLE ONLY CRITICAL RESULT CALLED TO, READ BACK BY AND VERIFIED WITH: HANK ZOMPA ON 09/25/16 AT 3716 QSD Performed at [facility], [address], [place], [place] [address]    Culture ESCHERICHIA COLI (A)  Final   Report Status 09/27/2016 FINAL  Final   Organism ID, Bacteria ESCHERICHIA COLI  Final      Susceptibility   Escherichia coli - MIC*    AMPICILLIN 8 SENSITIVE Sensitive     CEFAZOLIN <=4 SENSITIVE Sensitive     CEFEPIME <=1 SENSITIVE Sensitive     CEFTAZIDIME <=1 SENSITIVE Sensitive     CEFTRIAXONE <=1 SENSITIVE Sensitive     CIPROFLOXACIN >=4 RESISTANT Resistant     GENTAMICIN <=1 SENSITIVE Sensitive     IMIPENEM <=0.25 SENSITIVE Sensitive     TRIMETH/SULFA <=20 SENSITIVE Sensitive     AMPICILLIN/SULBACTAM 4 SENSITIVE Sensitive     PIP/TAZO <=4 SENSITIVE Sensitive     Extended ESBL NEGATIVE Sensitive     * ESCHERICHIA COLI  Blood Culture ID Panel (Reflexed)     Status: Abnormal   Collection Time: 09/24/16  6:32 AM  Result Value Ref Range Status   Enterococcus species NOT DETECTED NOT DETECTED Final   Listeria monocytogenes NOT DETECTED NOT DETECTED Final   Staphylococcus species NOT DETECTED NOT DETECTED Final   Staphylococcus aureus NOT DETECTED NOT DETECTED Final   Streptococcus species NOT DETECTED NOT DETECTED Final   Streptococcus agalactiae NOT DETECTED NOT DETECTED Final   Streptococcus pneumoniae NOT DETECTED NOT DETECTED Final   Streptococcus pyogenes NOT DETECTED NOT DETECTED Final   Acinetobacter baumannii NOT DETECTED NOT DETECTED Final   Enterobacteriaceae species DETECTED (A) NOT DETECTED Final    Comment: Enterobacteriaceae represent a large family of gram-negative bacteria, not a single organism. CRITICAL RESULT CALLED TO, READ BACK BY AND VERIFIED WITH: HANK ZOMPA ON 09/25/16 AT 0944 QSD    Enterobacter cloacae complex NOT DETECTED NOT DETECTED Final   Escherichia coli DETECTED (A) NOT DETECTED Final    Comment: CRITICAL RESULT CALLED TO, READ BACK BY AND VERIFIED WITH: HANK ZOMPA ON 09/25/16 AT 0944 QSD    Klebsiella oxytoca NOT DETECTED NOT DETECTED Final   Klebsiella pneumoniae NOT  DETECTED NOT DETECTED Final   Proteus species NOT DETECTED NOT DETECTED Final   Serratia marcescens NOT DETECTED NOT DETECTED Final   Carbapenem resistance NOT DETECTED NOT DETECTED Final   Haemophilus influenzae NOT DETECTED NOT DETECTED Final   Neisseria meningitidis NOT DETECTED NOT DETECTED Final   Pseudomonas aeruginosa NOT DETECTED NOT DETECTED Final   Candida albicans NOT DETECTED NOT DETECTED Final   Candida glabrata NOT DETECTED NOT DETECTED Final   Candida krusei NOT DETECTED NOT DETECTED Final   Candida parapsilosis NOT DETECTED NOT DETECTED  Final   Candida tropicalis NOT DETECTED NOT DETECTED Final  MRSA PCR Screening     Status: None   Collection Time: 09/24/16  9:00 PM  Result Value Ref Range Status   MRSA by PCR NEGATIVE NEGATIVE Final    Comment:        The GeneXpert MRSA Assay (FDA approved for NASAL specimens only), is one component of a comprehensive MRSA colonization surveillance program. It is not intended to diagnose MRSA infection nor to guide or monitor treatment for MRSA infections.     Coagulation Studies:  Recent Labs  09/25/16 0452  LABPROT 14.9  INR 1.16    Urinalysis: No results for input(s): COLORURINE, LABSPEC, PHURINE, GLUCOSEU, HGBUR, BILIRUBINUR, KETONESUR, PROTEINUR, UROBILINOGEN, NITRITE, LEUKOCYTESUR in the last 72 hours.  Invalid input(s): APPERANCEUR    Imaging: No results found.   Medications:   . meropenem Stopped (09/26/16 1232)   . atorvastatin  20 mg Oral q1800  . epoetin (EPOGEN/PROCRIT) injection  10,000 Units Intravenous Q M,W,F-HD  . heparin subcutaneous  5,000 Units Subcutaneous Q8H  . insulin aspart  0-5 Units Subcutaneous QHS  . insulin aspart  0-9 Units Subcutaneous TID WC  . insulin detemir  5 Units Subcutaneous Daily  . ipratropium-albuterol  3 mL Nebulization Q6H  . lamoTRIgine  100 mg Oral QHS  . levETIRAcetam  500 mg Oral BID  . midodrine  5 mg Oral TID WC  . pantoprazole sodium  40 mg Oral Daily   . sevelamer carbonate  0.8 g Oral TID WC   acetaminophen **OR** acetaminophen, ondansetron **OR** ondansetron (ZOFRAN) IV, senna-docusate  Assessment/ Plan:  [name] is a 83 y.o. black male with end-stage renal disease on hemodialysis, hypertension, diabetes type 2, insulin-dependent, seizure disorder, peripheral neuropathy, vascular dementia  MWF [facility] [place] Left arm AVF  1. End Stage Renal Disease: MWF. Seen and examined on hemodialysis.   Continue MWF schedule.   2. Sepsis: E. Coli in blood cultures. Benign abdominal exam. Afebrile.  - Appreciate ID input.   3. Anemia of chronic kidney disease: hemoglobin 9 - epo with next HD treatment.  Mircera as outpatient.  .   4. Secondary Hyperparathyroidism:   - sevelamer    LOS: O[place], Coco 6/15/201812:49 PM

## 2016-09-27 NOTE — Progress Notes (Signed)
[facility] INFECTIOUS DISEASE PROGRESS NOTE Date of Admission:  09/24/2016     ID: Michael Acosta is a 83 y.o. male with sepsis Principal Problem:   Septic shock ([facility]) Active Problems:   CAP (community acquired pneumonia)   Cholecystitis   Elevated troponin   ESRD on hemodialysis (HCC)   Dementia   PVD (peripheral vascular disease) (HCC)   IDDM (insulin dependent diabetes mellitus) ([place])   Bacteremia   DNR (do not resuscitate) discussion   Palliative care by specialist   Sepsis ([place])   Weakness generalized   Subjective: Out of unit, no fevers, denies abd pain   ROS  Eleven systems are reviewed and negative except per hpi  Medications:  Antibiotics Given (last 72 hours)    Date/Time Action Medication Dose Rate   09/25/16 0915 New Bag/Given   meropenem (MERREM) IVPB SOLR 500 mg 500 mg 100 mL/hr   09/26/16 1202 New Bag/Given   meropenem (MERREM) IVPB SOLR 500 mg 500 mg 100 mL/hr   09/27/16 1611 New Bag/Given   ceFAZolin (ANCEF) IVPB 1 g/50 mL premix 1 g 100 mL/hr     . atorvastatin  20 mg Oral q1800  . epoetin (EPOGEN/PROCRIT) injection  10,000 Units Intravenous Q M,W,F-HD  . heparin subcutaneous  5,000 Units Subcutaneous Q8H  . insulin aspart  0-5 Units Subcutaneous QHS  . insulin aspart  0-9 Units Subcutaneous TID WC  . insulin detemir  5 Units Subcutaneous Daily  . ipratropium-albuterol  3 mL Nebulization Q6H  . lamoTRIgine  100 mg Oral QHS  . levETIRAcetam  500 mg Oral BID  . midodrine  5 mg Oral TID WC  . pantoprazole sodium  40 mg Oral Daily  . sevelamer carbonate  0.8 g Oral TID WC    Objective: Vital signs in last 24 hours: Temp:  [94.6 F (34.8 C)-98.4 F (36.9 C)] 98 F (36.7 C) (06/15 1624) Pulse Rate:  [74-90] 90 (06/15 1624) Resp:  [16-22] 21 (06/15 1420) BP: (107-142)/(52-111) 121/52 (06/15 1624) SpO2:  [93 %-98 %] 98 % (06/15 1624) Weight:  [68 kg (149 lb 14.6 oz)-88 kg (194 lb)] 68 kg (149 lb 14.6 oz) (06/15 1415) Constitutional: He is  oriented to person, place, and time. thin HENT: anicteric Mouth/Throat: Oropharynx is clear and moist. No oropharyngeal exudate.  Cardiovascular: Normal rate, regular rhythm and normal heart sounds.  Pulmonary/Chest: Effort normal and breath sounds normal. No respiratory distress. He has no wheezes.  Abdominal: Soft. Bowel sounds are normal. He exhibits no distension. There is no tenderness.  Lymphadenopathy: He has no cervical adenopathy.  Neurological: He is alert and oriented to person, place, and time.  Skin: Skin is warm and dry. No rash noted. No erythema.  EXT HD AVF LUE WNL Psychiatric: He has a normal mood and affect. His behavior is normal.   Lab Results  Recent Labs  09/26/16 0207 09/27/16 0450  WBC 8.9 6.9  HGB 8.9* 9.0*  HCT 27.3* 26.7*  NA 138 139  K 4.0 4.2  CL 98* 98*  CO2 31 30  BUN 44* 61*  CREATININE 4.75* 6.54*    Microbiology:  Studies/Results: No results found.  Assessment/Plan: Michael Acosta is a 83 y.o. male with ESRD admitted with NV temp 104 and elevated lfts and lipase. USS shows gallstones.  He likely has gallstone pancreatitis as source of his E coli bacteremia.  Seen by surgery but is a poor surgical candidate.  He is having no further abd pain, NV and is  afebrile. LFTs improving,   Recommendations Change to ancef while inpatient and then can dc on oral keflex for a 14 day total abx course. Monitor lfts and exam.  Consider CT abd if worsens.  Thank you very much for the consult. Will follow with you.  [place], Ocie Tino P   09/27/2016, 5:10 PM

## 2016-09-27 NOTE — Progress Notes (Signed)
Blood cultures positive.  Dr. Manuella Ghazi aware.  Infectious disease already following.

## 2016-09-28 LAB — COMPREHENSIVE METABOLIC PANEL
ALK PHOS: 192 U/L — AB (ref 38–126)
ALT: 54 U/L (ref 17–63)
ANION GAP: 14 (ref 5–15)
AST: 50 U/L — ABNORMAL HIGH (ref 15–41)
Albumin: 3.2 g/dL — ABNORMAL LOW (ref 3.5–5.0)
BILIRUBIN TOTAL: 0.9 mg/dL (ref 0.3–1.2)
BUN: 40 mg/dL — ABNORMAL HIGH (ref 6–20)
CALCIUM: 8.5 mg/dL — AB (ref 8.9–10.3)
CO2: 29 mmol/L (ref 22–32)
CREATININE: 4.8 mg/dL — AB (ref 0.61–1.24)
Chloride: 96 mmol/L — ABNORMAL LOW (ref 101–111)
GFR, EST AFRICAN AMERICAN: 12 mL/min — AB (ref >60)
GFR, EST NON AFRICAN AMERICAN: 10 mL/min — AB (ref >60)
Glucose, Bld: 192 mg/dL — ABNORMAL HIGH (ref 65–99)
Potassium: 3.8 mmol/L (ref 3.5–5.1)
SODIUM: 139 mmol/L (ref 135–145)
TOTAL PROTEIN: 7.5 g/dL (ref 6.5–8.1)

## 2016-09-28 LAB — GLUCOSE, CAPILLARY
GLUCOSE-CAPILLARY: 246 mg/dL — AB (ref 65–99)
GLUCOSE-CAPILLARY: 116 mg/dL — AB (ref 65–99)
Glucose-Capillary: 127 mg/dL — ABNORMAL HIGH (ref 65–99)
Glucose-Capillary: 253 mg/dL — ABNORMAL HIGH (ref 65–99)

## 2016-09-28 LAB — LIPASE, BLOOD: Lipase: 577 U/L — ABNORMAL HIGH (ref 11–51)

## 2016-09-28 LAB — CBC
HCT: 30.3 % — ABNORMAL LOW (ref 40.0–52.0)
HEMOGLOBIN: 10 g/dL — AB (ref 13.0–18.0)
MCH: 30.3 pg (ref 26.0–34.0)
MCHC: 32.9 g/dL (ref 32.0–36.0)
MCV: 92 fL (ref 80.0–100.0)
PLATELETS: 211 10*3/uL (ref 150–440)
RBC: 3.3 MIL/uL — AB (ref 4.40–5.90)
RDW: 15.3 % — ABNORMAL HIGH (ref 11.5–14.5)
WBC: 8.1 10*3/uL (ref 3.8–10.6)

## 2016-09-28 MED ORDER — SODIUM CHLORIDE 0.9% FLUSH
3.0000 mL | Freq: Two times a day (BID) | INTRAVENOUS | Status: DC
  Administered 2016-09-28: 3 mL via INTRAVENOUS

## 2016-09-28 NOTE — Plan of Care (Signed)
Problem: SLP Dysphagia Goals Goal: Misc Dysphagia Goal Pt will safely tolerate po diet of least restrictive consistency w/ no overt s/s of aspiration noted by Staff/pt/family x3 sessions.    

## 2016-09-28 NOTE — Progress Notes (Signed)
[facility] [place] NAME: Michael Acosta    MR#:  [id number]  DATE OF BIRTH:  [birth year]  SUBJECTIVE:   Patient here due to sepsis from Escherichia coli bacteremia which is thought to be biliary in nature. No abdominal pain, nausea vomiting. No acute events overnight. No complaints presently.  REVIEW OF SYSTEMS:    Review of Systems  Constitutional: Negative for chills and fever.  HENT: Negative for congestion and tinnitus.   Eyes: Negative for blurred vision and double vision.  Respiratory: Negative for cough, shortness of breath and wheezing.   Cardiovascular: Negative for chest pain, orthopnea and PND.  Gastrointestinal: Negative for abdominal pain, diarrhea, nausea and vomiting.  Genitourinary: Negative for dysuria and hematuria.  Neurological: Negative for dizziness, sensory change and focal weakness.  All other systems reviewed and are negative.   Nutrition: Renal/Carb modified Tolerating Diet: yes Tolerating PT: Eval noted   DRUG ALLERGIES:   Allergies  Allergen Reactions  . Aspirin   . Bc Powder [Aspirin-Salicylamide-Caffeine]   . Penicillins Other (See Comments)    Has patient had a PCN reaction causing immediate rash, facial/tongue/throat swelling, SOB or lightheadedness with hypotension: Unknown Has patient had a PCN reaction causing severe rash involving mucus membranes or skin necrosis: Unknown Has patient had a PCN reaction that required hospitalization: Unknown Has patient had a PCN reaction occurring within the last 10 years: Unknown If all of the above answers are "NO", then may proceed with Cephalosporin use.  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    VITALS:  Blood pressure (!) 127/49, pulse (!) 101, temperature 98.3 F (36.8 C), temperature source Oral, resp. rate 18, height 5\' 6"  (1.676 m), weight 67.8 kg (149 lb 6.4 oz), SpO2 97 %.  PHYSICAL EXAMINATION:   Physical Exam  GENERAL:  83 y.o.-year-old patient sitting up in  chair eating breakfast.  EYES: Pupils equal, round, reactive to light and accommodation. No scleral icterus. Extraocular muscles intact.  HEENT: Head atraumatic, normocephalic. Oropharynx and nasopharynx clear.  NECK:  Supple, no jugular venous distention. No thyroid enlargement, no tenderness.  LUNGS: Normal breath sounds bilaterally, no wheezing, rales, rhonchi. No use of accessory muscles of respiration.  CARDIOVASCULAR: S1, S2 normal. No murmurs, rubs, or gallops.  ABDOMEN: Soft, nontender, nondistended. Bowel sounds present. No organomegaly or mass.  EXTREMITIES: No cyanosis, clubbing or edema b/l.    NEUROLOGIC: Cranial nerves II through XII are intact. No focal Motor or sensory deficits b/l.   PSYCHIATRIC: The patient is alert and oriented x 3.  SKIN: No obvious rash, lesion, or ulcer.  Left upper extremity AV fistula with good bruit and thrill.    LABORATORY PANEL:   CBC  Recent Labs Lab 09/28/16 0937  WBC 8.1  HGB 10.0*  HCT 30.3*  PLT 211   ------------------------------------------------------------------------------------------------------------------  Chemistries   Recent Labs Lab 09/25/16 0959  09/28/16 0937  NA  --   < > 139  K  --   < > 3.8  CL  --   < > 96*  CO2  --   < > 29  GLUCOSE  --   < > 192*  BUN  --   < > 40*  CREATININE  --   < > 4.80*  CALCIUM  --   < > 8.5*  MG 1.8  --   --   AST  --   < > 50*  ALT  --   < > 54  ALKPHOS  --   < >  192*  BILITOT  --   < > 0.9  < > = values in this interval not displayed. ------------------------------------------------------------------------------------------------------------------  Cardiac Enzymes  Recent Labs Lab 09/25/16 2323  TROPONINI 0.79*   ------------------------------------------------------------------------------------------------------------------  RADIOLOGY:  No results found.   ASSESSMENT AND PLAN:   83 year old male with past medical history of end-stage renal disease on  hemodialysis, history of previous CVA, seizures, peripheral vascular disease, peripheral neuropathy, diabetes, dementia, depression, anemia of chronic disease who presented to the hospital due to shortness of breath and hypoxia  1. Sepsis-patient presented with a fever, tachycardia and suspected pneumonia given his hypoxia. -Patient's blood cultures although her positive for Escherichia coli. Source of the sepsis is thought to be biliary in nature given patient's ultrasound findings. -Patient although denies any abdominal pain nausea vomiting and is tolerating by mouth well. -Patient's LFTs and lipase is improved. He is currently afebrile. The patient was on IV meropenem now now down to IV Ancef and as per infectious disease can be switched over to oral Keflex for total of 2 weeks of treatment upon discharge.  2. Abnormal LFTs-suspected to be secondary to gallstone pancreatitis. Patient's lipase and LFTs were elevated. -Patient although denies any abdominal pain, nausea vomiting. Lipase has trended down. LFTs improving. -Continue supportive care for now.  3. Diabetes type 2 without complication-continue Levemir,  sliding scale insulin. Blood sugar stable.  4. Secondary hyperparathyroidism - cont. Renvela  5. Hx of Seizures - cont. Keppra, Lamictal.  6. Hyperlipidemia - cont. Atorvastatin  7. GERD - cont. Protonix.   8. ESRD on HD - nephrology consulted and cont. Dialysis on MWF.    All the records are reviewed and case discussed with Care Management/Social Worker. Management plans discussed with the patient, family and they are in agreement.  CODE STATUS: Limited Code  DVT Prophylaxis: Hep. SQ  TOTAL TIME TAKING CARE OF THIS PATIENT: 30 minutes.   POSSIBLE D/C IN 1-2 DAYS, DEPENDING ON CLINICAL CONDITION.   Henreitta Leber M.D on 09/28/2016 at 3:41 PM  Between 7am to 6pm - Pager - [phone]  After 6pm go to [email or web address] - Proofreader  [facility]  Office  [phone]  CC: Primary care physician; Marden Noble, MD

## 2016-09-28 NOTE — Evaluation (Signed)
Physical Therapy Evaluation Patient Details Name: Michael [facility] MRN: [id number] DOB: 07/22/1929 Today's Date: 09/28/2016   History of Present Illness  83 y/o male admitted to hospital for septic shock and acute hypoxic respiratory failure on 09/24/16. Pt is currently a resident at [facility]. He was transferred to the hospital due to 104deg fever, vomiting, SAO2 72%, and BP 74/70. Imaging showed gall stones and pancreatitis. No surgery was performed, however pt did receive dialysis. PMH includes falls, CAP, dementia, PVD, IDDM, e coli bacterium, weakness, end-stage renal disease, TIA, neuropathy, HTN, depression, and seizures.    Clinical Impression  Pt is a pleasant 83 year old admitted for septic shock and acute hypoxic respiratory failure. Pt performs bed mobility with min assist, and transfers/ambulates with +2 mod assist for safety. Pt able to ambulate from EOB to recliner with +2 mod assist and verbal cueing to keep an upright versus crouched posture. Pt demonstrates deficits in strength (UE and LE), ROM and functional mobility. Pt oriented to person and situation, but not to place (pt states he was in [facility] hospital this session). Would benefit from skilled PT to address above deficits and promote optimal return to PLOF. This session limited due to pt's lethargy and difficulty staying aroused, however pt is motivated to participate in therapy. PT recommends dc to SNF and pt has been accepted into [facility], so he is planning to transfer out of [place] where he has resided for 45yrs.    Follow Up Recommendations SNF ([facility] - pt accepted)    Equipment Recommendations  None recommended by PT    Recommendations for Other Services       Precautions / Restrictions Precautions Precautions: Fall Restrictions Weight Bearing Restrictions: No      Mobility  Bed Mobility Overal bed mobility: Needs Assistance Bed Mobility: Supine to Sit     Supine to sit: Min  assist     General bed mobility comments: Pt had good strategy for bed mobility, but required min assist to get his shoulders off the bed to come to upright position. No dizziness noted at EOB. Pt unable to "scoot" out to get his feet flat without mod-max assist.  Transfers Overall transfer level: Needs assistance Equipment used: Rolling walker (2 wheeled) Transfers: Sit to/from Stand Sit to Stand: +2 safety/equipment;Mod assist         General transfer comment: Pt in crouched/mini squat position when rising to standing position. Verbal cueing to stand up fully and lock out his legs. +2 assist for safety given pt's weakness standing and heavy reliance on UE's.   Ambulation/Gait Ambulation/Gait assistance: Mod assist;+2 safety/equipment Ambulation Distance (Feet): 2 Feet (EOB to recliner) Assistive device: Rolling walker (2 wheeled) Gait Pattern/deviations: Step-to pattern;Shuffle;Trunk flexed;Wide base of support     General Gait Details: Pt able to straighten his LE's with verbal cueing, but his trunk remained flexed when ambulating. Pt had good sequencing with RW and was able to get to recliner with +2 mod assist and verbal cueing for encouragement and hand placement on recliner.  Stairs            Wheelchair Mobility    Modified Rankin (Stroke Patients Only)       Balance Overall balance assessment: History of Falls;Needs assistance     Sitting balance - Comments: Pt sat EOB for 74min with both UE's in his lap with no LOB noted.        Standing balance comment: Pt relied heavily on his B UE's  for support when standing with wide base of support. No LOB noted.                             Pertinent Vitals/Pain Pain Assessment: No/denies pain    Home Living Family/patient expects to be discharged to:: Skilled nursing facility                 Additional Comments: Family wants pt to move to SNF closer to [place] (where his wife lives). He has been  accepted into [facility] and is planning to dc there.    Prior Function Level of Independence: Needs assistance   Gait / Transfers Assistance Needed: Pt states he needed "a little help" for bed mobility, transfers, and ambulation.     Comments: Pt used a manual w/c in the SNF, which he states he was "fair" at propelling himself. Pt otherwise ambulated with a RW and min assist.     Hand Dominance        Extremity/Trunk Assessment   Upper Extremity Assessment Upper Extremity Assessment: Generalized weakness (grossly 4+/5 B for elbow flex/ext, 4/5 for grip B)    Lower Extremity Assessment Lower Extremity Assessment: Generalized weakness (grossly 4+/5 B for DF/PF, knee flex/ext, and hip flex)       Communication   Communication: Other (comment) (Hard to understand pt's speech at times)  Cognition Arousal/Alertness: Lethargic Behavior During Therapy: Flat affect Overall Cognitive Status: History of cognitive impairments - at baseline                                 General Comments: Difficulty following commands consistently, however pt motivated to participate in therapy. Pt hard to keep aroused this session and pt continued to complain of "gunk" in his eyes, which prevented him from keeping his eyes open. Pt repeatedly asked for a wash clothe and wiped his R eye several times this visit.      General Comments      Exercises Other Exercises Other Exercises: Supine ther-ex x10 B included: ankle pumps, SLR's (2x10), hip abd, shoulder flex, and elbow flex.    Assessment/Plan    PT Assessment Patient needs continued PT services  PT Problem List Decreased strength;Decreased range of motion;Decreased activity tolerance;Decreased balance;Decreased cognition       PT Treatment Interventions Gait training;Functional mobility training;Therapeutic activities;Therapeutic exercise;Balance training;Patient/family education;Wheelchair mobility training    PT Goals  (Current goals can be found in the Care Plan section)  Acute Rehab PT Goals Patient Stated Goal: to get stronger PT Goal Formulation: With patient Time For Goal Achievement: 10/12/16 Potential to Achieve Goals: Good    Frequency Min 2X/week   Barriers to discharge        Co-evaluation               AM-PAC PT "6 Clicks" Daily Activity  Outcome Measure Difficulty turning over in bed (including adjusting bedclothes, sheets and blankets)?: A Lot Difficulty moving from lying on back to sitting on the side of the bed? : Total Difficulty sitting down on and standing up from a chair with arms (e.g., wheelchair, bedside commode, etc,.)?: Total Help needed moving to and from a bed to chair (including a wheelchair)?: Total Help needed walking in hospital room?: A Lot Help needed climbing 3-5 steps with a railing? : Total 6 Click Score: 8    End of Session Equipment  Utilized During Treatment: Gait belt Activity Tolerance: Patient limited by lethargy Patient left: in chair;with call bell/phone within reach;with chair alarm set Nurse Communication: Mobility status PT Visit Diagnosis: Other abnormalities of gait and mobility (R26.89);Muscle weakness (generalized) (M62.81);History of falling (Z91.81)    Time: 5170-0174 PT Time Calculation (min) (ACUTE ONLY): 27 min   Charges:         PT G Codes:        Donaciano Eva, PT, SPT  Marni Griffon 09/28/2016, 12:56 PM

## 2016-09-28 NOTE — Progress Notes (Signed)
[facility]  ROUNDING NOTE   Subjective:   Hemodialysis treatment yesterday. Tolerated treatment well. UF of 0.5Liter  Eating breakfast, sitting in chair this morning. No complaints  Objective:  Vital signs in last 24 hours:  Temp:  [94.6 F (34.8 C)-98.6 F (37 C)] 98.1 F (36.7 C) (06/16 0550) Pulse Rate:  [74-90] 83 (06/16 0550) Resp:  [16-22] 16 (06/16 0550) BP: (107-142)/(52-111) 123/63 (06/16 0550) SpO2:  [92 %-98 %] 97 % (06/16 0550) Weight:  [67.8 kg (149 lb 6.4 oz)-68.3 kg (150 lb 9.2 oz)] 67.8 kg (149 lb 6.4 oz) (06/16 0550)  Weight change: -19.698 kg (-43 lb 6.8 oz) Filed Weights   09/27/16 1115 09/27/16 1415 09/28/16 0550  Weight: 68.3 kg (150 lb 9.2 oz) 68 kg (149 lb 14.6 oz) 67.8 kg (149 lb 6.4 oz)    Intake/Output: I/O last 3 completed shifts: In: 280 [P.O.:230; IV Piggyback:50] Out: 500 [Other:500]   Intake/Output this shift:  Total I/O In: 240 [P.O.:240] Out: 0   Physical Exam: General: Ill appearing  Head: nonrebreather  Eyes: Anicteric, PERRL  Neck: Supple, trachea midline  Lungs:  Clear to auscultation  Heart: Regular rate and rhythm  Abdomen:  Soft, nontender, obese  Extremities: no peripheral edema.  Neurologic: Alert to self only  Skin: No lesions  Access: Left AVF    Basic Metabolic Panel:  Recent Labs Lab 09/24/16 0638 09/25/16 0452 09/25/16 0959 09/26/16 0207 09/27/16 0450 09/28/16 0937  NA 142 140  --  138 139 139  K 4.2 5.0  --  4.0 4.2 3.8  CL 95* 100*  --  98* 98* 96*  CO2 31 27  --  31 30 29   GLUCOSE 206* 191*  --  247* 189* 192*  BUN 48* 70*  --  44* 61* 40*  CREATININE 5.73* 6.96*  --  4.75* 6.54* 4.80*  CALCIUM 9.4 8.7*  --  8.2* 8.3* 8.5*  MG  --   --  1.8  --   --   --     Liver Function Tests:  Recent Labs Lab 09/24/16 0638 09/25/16 0452 09/26/16 0207 09/28/16 0937  AST 243* 139* 79* 50*  ALT 198* 156* 95* 54  ALKPHOS 258* 212* 185* 192*  BILITOT 2.5* 1.6* 0.8 0.9  PROT 8.3* 7.8 6.6  7.5  ALBUMIN 3.7 3.4* 2.9* 3.2*    Recent Labs Lab 09/24/16 0638 09/25/16 0452 09/26/16 0207  LIPASE 2,491* 1,189* 1,444*  AMYLASE  --  1,645* 1,312*   No results for input(s): AMMONIA in the last 168 hours.  CBC:  Recent Labs Lab 09/24/16 0638 09/25/16 0452 09/26/16 0207 09/27/16 0450 09/28/16 0937  WBC 21.1* 14.8* 8.9 6.9 8.1  NEUTROABS 19.9*  --   --   --   --   HGB 11.0* 11.1* 8.9* 9.0* 10.0*  HCT 33.8* 34.3* 27.3* 26.7* 30.3*  MCV 93.1 95.7 94.4 94.0 92.0  PLT 196 193 172 166 211    Cardiac Enzymes:  Recent Labs Lab 09/24/16 2259 09/25/16 0452 09/25/16 0959 09/25/16 1808 09/25/16 2323  TROPONINI 0.77* 1.45* 1.21* 0.99* 0.79*    BNP: Invalid input(s): POCBNP  CBG:  Recent Labs Lab 09/26/16 2124 09/27/16 0728 09/27/16 1621 09/27/16 2123 09/28/16 0740  GLUCAP 196* 152* 165* 58* 32*    Microbiology: Results for orders placed or performed during the hospital encounter of 09/24/16  Blood Culture (routine x 2)     Status: None (Preliminary result)   Collection Time: 09/24/16  6:20 AM  Result Value Ref Range Status   Specimen Description BLOOD ARTERIAL DRAW FROM R ARM  Final   Special Requests BOTTLES DRAWN AEROBIC AND ANAEROBIC BCAV  Final   Culture NO GROWTH 3 DAYS  Final   Report Status PENDING  Incomplete  Blood Culture (routine x 2)     Status: Abnormal   Collection Time: 09/24/16  6:32 AM  Result Value Ref Range Status   Specimen Description BLOOD R HAND  Final   Special Requests   Final    BOTTLES DRAWN AEROBIC AND ANAEROBIC Blood Culture adequate volume   Culture  Setup Time   Final    GRAM NEGATIVE RODS AEROBIC BOTTLE ONLY CRITICAL RESULT CALLED TO, READ BACK BY AND VERIFIED WITH: HANK ZOMPA ON 09/25/16 AT 7829 QSD Performed at [facility], [place] [address] [place], [place] [address]    Culture ESCHERICHIA COLI (A)  Final   Report Status 09/27/2016 FINAL  Final   Organism ID, Bacteria ESCHERICHIA COLI  Final       Susceptibility   Escherichia coli - MIC*    AMPICILLIN 8 SENSITIVE Sensitive     CEFAZOLIN <=4 SENSITIVE Sensitive     CEFEPIME <=1 SENSITIVE Sensitive     CEFTAZIDIME <=1 SENSITIVE Sensitive     CEFTRIAXONE <=1 SENSITIVE Sensitive     CIPROFLOXACIN >=4 RESISTANT Resistant     GENTAMICIN <=1 SENSITIVE Sensitive     IMIPENEM <=0.25 SENSITIVE Sensitive     TRIMETH/SULFA <=20 SENSITIVE Sensitive     AMPICILLIN/SULBACTAM 4 SENSITIVE Sensitive     PIP/TAZO <=4 SENSITIVE Sensitive     Extended ESBL NEGATIVE Sensitive     * ESCHERICHIA COLI  Blood Culture ID Panel (Reflexed)     Status: Abnormal   Collection Time: 09/24/16  6:32 AM  Result Value Ref Range Status   Enterococcus species NOT DETECTED NOT DETECTED Final   Listeria monocytogenes NOT DETECTED NOT DETECTED Final   Staphylococcus species NOT DETECTED NOT DETECTED Final   Staphylococcus aureus NOT DETECTED NOT DETECTED Final   Streptococcus species NOT DETECTED NOT DETECTED Final   Streptococcus agalactiae NOT DETECTED NOT DETECTED Final   Streptococcus pneumoniae NOT DETECTED NOT DETECTED Final   Streptococcus pyogenes NOT DETECTED NOT DETECTED Final   Acinetobacter baumannii NOT DETECTED NOT DETECTED Final   Enterobacteriaceae species DETECTED (A) NOT DETECTED Final    Comment: Enterobacteriaceae represent a large family of gram-negative bacteria, not a single organism. CRITICAL RESULT CALLED TO, READ BACK BY AND VERIFIED WITH: HANK ZOMPA ON 09/25/16 AT 0944 QSD    Enterobacter cloacae complex NOT DETECTED NOT DETECTED Final   Escherichia coli DETECTED (A) NOT DETECTED Final    Comment: CRITICAL RESULT CALLED TO, READ BACK BY AND VERIFIED WITH: HANK ZOMPA ON 09/25/16 AT 0944 QSD    Klebsiella oxytoca NOT DETECTED NOT DETECTED Final   Klebsiella pneumoniae NOT DETECTED NOT DETECTED Final   Proteus species NOT DETECTED NOT DETECTED Final   Serratia marcescens NOT DETECTED NOT DETECTED Final   Carbapenem resistance NOT DETECTED  NOT DETECTED Final   Haemophilus influenzae NOT DETECTED NOT DETECTED Final   Neisseria meningitidis NOT DETECTED NOT DETECTED Final   Pseudomonas aeruginosa NOT DETECTED NOT DETECTED Final   Candida albicans NOT DETECTED NOT DETECTED Final   Candida glabrata NOT DETECTED NOT DETECTED Final   Candida krusei NOT DETECTED NOT DETECTED Final   Candida parapsilosis NOT DETECTED NOT DETECTED Final   Candida tropicalis NOT DETECTED NOT DETECTED Final  MRSA PCR  Screening     Status: None   Collection Time: 09/24/16  9:00 PM  Result Value Ref Range Status   MRSA by PCR NEGATIVE NEGATIVE Final    Comment:        The GeneXpert MRSA Assay (FDA approved for NASAL specimens only), is one component of a comprehensive MRSA colonization surveillance program. It is not intended to diagnose MRSA infection nor to guide or monitor treatment for MRSA infections.     Coagulation Studies: No results for input(s): LABPROT, INR in the last 72 hours.  Urinalysis: No results for input(s): COLORURINE, LABSPEC, PHURINE, GLUCOSEU, HGBUR, BILIRUBINUR, KETONESUR, PROTEINUR, UROBILINOGEN, NITRITE, LEUKOCYTESUR in the last 72 hours.  Invalid input(s): APPERANCEUR    Imaging: No results found.   Medications:   . ceFAZolin Stopped (09/27/16 1641)   . atorvastatin  20 mg Oral q1800  . epoetin (EPOGEN/PROCRIT) injection  10,000 Units Intravenous Q M,W,F-HD  . heparin subcutaneous  5,000 Units Subcutaneous Q8H  . insulin aspart  0-5 Units Subcutaneous QHS  . insulin aspart  0-9 Units Subcutaneous TID WC  . insulin detemir  5 Units Subcutaneous Daily  . ipratropium-albuterol  3 mL Nebulization BID  . lamoTRIgine  100 mg Oral QHS  . levETIRAcetam  500 mg Oral BID  . midodrine  5 mg Oral TID WC  . pantoprazole sodium  40 mg Oral Daily  . sevelamer carbonate  0.8 g Oral TID WC   acetaminophen **OR** acetaminophen, ondansetron **OR** ondansetron (ZOFRAN) IV, senna-docusate  Assessment/ Plan:  Mr.  Michael [facility] is a 83 y.o. black male with end-stage renal disease on hemodialysis, hypertension, diabetes type 2, insulin-dependent, seizure disorder, peripheral neuropathy, vascular dementia  MWF [facility] [place] Left arm AVF  1. End Stage Renal Disease: MWF. Tolerated hemodialysis yesterday.   Continue MWF schedule.   2. Sepsis: E. Coli in blood cultures. Benign abdominal exam. Afebrile.  - Appreciate ID and surgery input. Cephalexin as outpatient  3. Anemia of chronic kidney disease: hemoglobin 10 - epo with HD treatment.  Mircera as outpatient.  .   4. Secondary Hyperparathyroidism:   - sevelamer    LOS: 4 Saysha Menta 6/16/201810:50 AM

## 2016-09-28 NOTE — Evaluation (Signed)
Clinical/Bedside Swallow Evaluation Patient Details  Name: Michael Acosta MRN: [id number] Date of Birth: 1930-03-27  Today's Date: 09/28/2016 Time: SLP Start Time (ACUTE ONLY): 3710 SLP Stop Time (ACUTE ONLY): 1645 SLP Time Calculation (min) (ACUTE ONLY): 60 min  Past Medical History:  Past Medical History:  Diagnosis Date  . Anemia of chronic disease   . Chronic pain disorder   . Dementia   . Depression   . ESRD on hemodialysis ([facility])    a. MWF  . Falls frequently   . GERD (gastroesophageal reflux disease)   . Hypertension   . Insulin dependent diabetes mellitus with complications ([facility])   . Mood disorder ([facility])   . Peripheral neuropathy    a. diabetic neuropathy  . Peripheral vascular disease ([facility])    a. s/p prior toe amputation; b. [facility] by [facility]  . Prostate cancer (Michael [facility])   . Seizures ([facility])    EPILEPSY  . Stroke [facility])    TIA  . Weakness generalized    Past Surgical History:  Past Surgical History:  Procedure Laterality Date  . COLON SURGERY     RESECTION  . INSERTION OF DIALYSIS CATHETER     SHUNT  . LEG SURGERY Left    MULTIPLE VEIN SURGERIES  . TOTAL HIP ARTHROPLASTY Left    HPI:  Pt is a 83 y.o. male with a known history of multiple medical issues including GERD, End-stage renal disease on dialysis, Dementia, diabetes mellitus, GERD, CVA, hypertension, neuropathy is a resident of [facility]. Patient was transferred from the facility for vomiting and fever of 10 60F. When patient was evaluated by the EMS his oxygen saturation was 72% on room air and he was put on nonrebreather mask at 100% and his oxygen saturation improved 100% on 10 L by nonrebreather mask. Patient receives dialysis. NSG is reporting overt coughing w/ thin liquids and possible pocketing w/ solids. Pt is alert/awake and able to answer basic questions re: self but does have a baseline of Dementia; noted slow motor responses overall.    Assessment / Plan / Recommendation Clinical Impression  Pt  appears to present w/ oropharyngeal phase dysphagia w/ increased risk for aspiration moreso w/ thin liquids. Pt also exhibited oral phase mastication deficits w/ increased textured foods. Pt consumed trials of thin liquids initially via Cup w/ lengthy time bringing cup to mouth and increased oral motor movements in preparation to accept the trials; he specifically asked for a straw to "make it easier". Overt coughing noted during trials w/ thin liquids as NSG has noted d/t the reduced awareness and coordination/control during intake of boluses.  When given trials of Nectar liquids, though still exhibiting the oral phase deficits, it was improved from the presenatation w/ trials of thin liquids in which pt appeared more timid w/ less coordination/control when taking the trials. He fed himself easily using the straw w/ the Nectar drink and even stated he "did better" when he drank Ensure. Pt exhibited increased oral phase time for mastication and almost a munching/smacking behavior as he masticated the increased textured trials - pt is missing lower molars. Foods broken down and moistened well were successful for him to Michael in a timely manner. Suspect pt's presenation could be directly related to Cognitive decline; he does have a baseline of Dementia. In addition, pt exhibited min s/s of Reflux behavior and could benefit from a PPI; Reflux precautions. NSG updated. Precautions posted. Diet modified.  SLP Visit Diagnosis: Dysphagia, oropharyngeal phase (R13.12);Dysphagia, pharyngoesophageal phase (R13.14)  Aspiration Risk  Mild aspiration risk (both oropharyngeal and Esophageal phases)    Diet Recommendation  Dysphagia level 2(minced, moistened well); Nectar consistency liquids. Aspiration precautions; REFLUX precautions  Medication Administration: Crushed with puree (whole if able w/ NSG)    Other  Recommendations Recommended Consults: Consider GI evaluation (for management of GERD; Dietician f/u) Oral  Care Recommendations: Oral care BID;Staff/trained caregiver to provide oral care;Patient independent with oral care Other Recommendations: Order thickener from pharmacy;Prohibited food (jello, ice cream, thin soups);Remove water pitcher;Have oral suction available   Follow up Recommendations  (TBD)      Frequency and Duration min 3x week  2 weeks       Prognosis Prognosis for Safe Diet Advancement: Fair Barriers to Reach Goals: Cognitive deficits      Swallow Study   General Date of Onset: 09/24/16 HPI: Pt is a 83 y.o. male with a known history of multiple medical issues including GERD, End-stage renal disease on dialysis, Dementia, diabetes mellitus, GERD, CVA, hypertension, neuropathy is a resident of  [facility]. Patient was transferred from the facility for vomiting and fever of 10 19F. When patient was evaluated by the EMS his oxygen saturation was 72% on room air and he was put on nonrebreather mask at 100% and his oxygen saturation improved 100% on 10 L by nonrebreather mask. Patient receives dialysis. NSG is reporting overt coughing w/ thin liquids and possible pocketing w/ solids. Pt is alert/awake and able to answer basic questions re: self but does have a baseline of Dementia; noted slow motor responses overall.  Type of Study: Bedside Swallow Evaluation Previous Swallow Assessment: none indicated Diet Prior to this Study: Regular;Thin liquids Temperature Spikes Noted: No (wbc 8.1) Respiratory Status: Room air History of Recent Intubation: No Behavior/Cognition: Alert;Cooperative;Pleasant mood;Confused;Distractible;Requires cueing (min) Oral Cavity Assessment: Within Functional Limits Oral Care Completed by SLP: Recent completion by staff Oral Cavity - Dentition: Dentures, top (few lower front dentition) Vision: Functional for self-feeding Self-Feeding Abilities: Able to feed self;Needs assist;Needs set up Patient Positioning: Upright in bed Baseline Vocal Quality:  Normal;Low vocal intensity Volitional Cough: Strong Volitional Swallow: Able to elicit    Oral/Motor/Sensory Function Overall Oral Motor/Sensory Function: Within functional limits (but noted increased oral movements)   Ice Chips Ice chips: Within functional limits Presentation: Spoon (fed; 3 trials)   Thin Liquid Thin Liquid: Impaired Presentation: Cup;Self Fed;Straw (1 trial via cup; 4 trials via straw) Oral Phase Impairments: Poor awareness of bolus;Reduced lingual movement/coordination Oral Phase Functional Implications: Prolonged oral transit (increased time taking trials into mouth) Pharyngeal  Phase Impairments: Cough - Immediate (x1/4 trials via straw) Other Comments: poor coordination and lengthy time when pt attempted to drink from cup; increased oral movements w/ decreased awareness of bolus to mouth    Nectar Thick Nectar Thick Liquid: Impaired (oral phase only) Presentation: Self Fed;Straw (~3 ozs total) Oral Phase Impairments: Reduced lingual movement/coordination;Poor awareness of bolus (sipping from straw as w/ thin liquids) Oral phase functional implications: Prolonged oral transit Pharyngeal Phase Impairments:  (none) Other Comments: though still exhibiting the oral phase deficits, it was improved from the trials of thin liquids in which pt appeared more timid taking the trials   Honey Thick Honey Thick Liquid: Not tested   Puree Puree: Within functional limits Presentation: Spoon;Self Fed (assisted) Other Comments: adequate oral phase coordination   Solid   GO   Solid: Impaired Presentation: Self Fed (assisted; 6 trials) Oral Phase Impairments: Impaired mastication;Poor awareness of bolus;Reduced lingual movement/coordination Oral Phase Functional Implications: Impaired  mastication;Prolonged oral transit Pharyngeal Phase Impairments:  (none) Other Comments: needed bolus broken down          Orinda Kenner, MS, CCC-SLP Watson,Katherine 09/28/2016,4:45  PM

## 2016-09-29 LAB — COMPREHENSIVE METABOLIC PANEL
ALT: 29 U/L (ref 17–63)
ANION GAP: 10 (ref 5–15)
AST: 38 U/L (ref 15–41)
Albumin: 2.8 g/dL — ABNORMAL LOW (ref 3.5–5.0)
Alkaline Phosphatase: 178 U/L — ABNORMAL HIGH (ref 38–126)
BUN: 53 mg/dL — ABNORMAL HIGH (ref 6–20)
CALCIUM: 8.3 mg/dL — AB (ref 8.9–10.3)
CHLORIDE: 98 mmol/L — AB (ref 101–111)
CO2: 31 mmol/L (ref 22–32)
CREATININE: 5.91 mg/dL — AB (ref 0.61–1.24)
GFR, EST AFRICAN AMERICAN: 9 mL/min — AB (ref >60)
GFR, EST NON AFRICAN AMERICAN: 8 mL/min — AB (ref >60)
Glucose, Bld: 178 mg/dL — ABNORMAL HIGH (ref 65–99)
Potassium: 4.1 mmol/L (ref 3.5–5.1)
Sodium: 139 mmol/L (ref 135–145)
Total Bilirubin: 0.6 mg/dL (ref 0.3–1.2)
Total Protein: 6.9 g/dL (ref 6.5–8.1)

## 2016-09-29 LAB — CULTURE, BLOOD (ROUTINE X 2): CULTURE: NO GROWTH

## 2016-09-29 LAB — LIPASE, BLOOD: LIPASE: 559 U/L — AB (ref 11–51)

## 2016-09-29 LAB — GLUCOSE, CAPILLARY
GLUCOSE-CAPILLARY: 230 mg/dL — AB (ref 65–99)
Glucose-Capillary: 270 mg/dL — ABNORMAL HIGH (ref 65–99)

## 2016-09-29 MED ORDER — CEPHALEXIN 500 MG PO CAPS: 500.0000 mg | ORAL_CAPSULE | Freq: Four times a day (QID) | ORAL | 0 refills | Status: DC

## 2016-09-29 MED ORDER — HYDROCODONE-ACETAMINOPHEN 5-325 MG PO TABS: 1.0000 | ORAL_TABLET | Freq: Four times a day (QID) | ORAL | 0 refills | Status: DC | PRN

## 2016-09-29 MED ORDER — CEPHALEXIN 500 MG PO CAPS: 500.0000 mg | ORAL_CAPSULE | Freq: Two times a day (BID) | ORAL | 0 refills | Status: AC

## 2016-09-29 NOTE — Progress Notes (Signed)
Pt will be discharged to [facility] via EMS. Report called to Bel[facility] at [facility], EMS notified of transport at 1449. Discharge instructions placed in packet. I will continue to assess.

## 2016-09-29 NOTE — Progress Notes (Signed)
[facility]  ROUNDING NOTE   Subjective:   Sitting in chair. Eating breakfast with no complaints. Thickened liquids.   Objective:  Vital signs in last 24 hours:  Temp:  [97.5 F (36.4 C)-98.3 F (36.8 C)] 98.3 F (36.8 C) (06/17 0409) Pulse Rate:  [75-101] 75 (06/17 0409) Resp:  [18] 18 (06/17 0409) BP: (118-127)/(49-65) 120/65 (06/17 0409) SpO2:  [95 %-98 %] 98 % (06/17 0409)  Weight change:  Filed Weights   09/27/16 1115 09/27/16 1415 09/28/16 0550  Weight: 68.3 kg (150 lb 9.2 oz) 68 kg (149 lb 14.6 oz) 67.8 kg (149 lb 6.4 oz)    Intake/Output: I/O last 3 completed shifts: In: 480 [P.O.:480] Out: 0    Intake/Output this shift:  No intake/output data recorded.  Physical Exam: General: NAD  Head: nonrebreather  Eyes: Anicteric, PERRL  Neck: Supple, trachea midline  Lungs:  Clear to auscultation  Heart: Regular rate and rhythm  Abdomen:  Soft, nontender, obese  Extremities: no peripheral edema.  Neurologic: Alert to self only  Skin: No lesions  Access: Left AVF    Basic Metabolic Panel:  Recent Labs Lab 09/25/16 0452 09/25/16 0959 09/26/16 0207 09/27/16 0450 09/28/16 0937 09/29/16 0535  NA 140  --  138 139 139 139  K 5.0  --  4.0 4.2 3.8 4.1  CL 100*  --  98* 98* 96* 98*  CO2 27  --  31 30 29 31   GLUCOSE 191*  --  247* 189* 192* 178*  BUN 70*  --  44* 61* 40* 53*  CREATININE 6.96*  --  4.75* 6.54* 4.80* 5.91*  CALCIUM 8.7*  --  8.2* 8.3* 8.5* 8.3*  MG  --  1.8  --   --   --   --     Liver Function Tests:  Recent Labs Lab 09/24/16 0638 09/25/16 0452 09/26/16 0207 09/28/16 0937 09/29/16 0535  AST 243* 139* 79* 50* 38  ALT 198* 156* 95* 54 29  ALKPHOS 258* 212* 185* 192* 178*  BILITOT 2.5* 1.6* 0.8 0.9 0.6  PROT 8.3* 7.8 6.6 7.5 6.9  ALBUMIN 3.7 3.4* 2.9* 3.2* 2.8*    Recent Labs Lab 09/24/16 0638 09/25/16 0452 09/26/16 0207 09/28/16 0937 09/29/16 0535  LIPASE 2,491* 1,189* 1,444* 577* 559*  AMYLASE  --  1,645*  1,312*  --   --    No results for input(s): AMMONIA in the last 168 hours.  CBC:  Recent Labs Lab 09/24/16 0638 09/25/16 0452 09/26/16 0207 09/27/16 0450 09/28/16 0937  WBC 21.1* 14.8* 8.9 6.9 8.1  NEUTROABS 19.9*  --   --   --   --   HGB 11.0* 11.1* 8.9* 9.0* 10.0*  HCT 33.8* 34.3* 27.3* 26.7* 30.3*  MCV 93.1 95.7 94.4 94.0 92.0  PLT 196 193 172 166 211    Cardiac Enzymes:  Recent Labs Lab 09/24/16 2259 09/25/16 0452 09/25/16 0959 09/25/16 1808 09/25/16 2323  TROPONINI 0.77* 1.45* 1.21* 0.99* 0.79*    BNP: Invalid input(s): POCBNP  CBG:  Recent Labs Lab 09/28/16 0740 09/28/16 1131 09/28/16 1641 09/28/16 2059 09/29/16 0751  GLUCAP 127* 246* 116* 253* 230*    Microbiology: Results for orders placed or performed during the hospital encounter of 09/24/16  Blood Culture (routine x 2)     Status: None (Preliminary result)   Collection Time: 09/24/16  6:20 AM  Result Value Ref Range Status   Specimen Description BLOOD ARTERIAL DRAW FROM R ARM  Final   Special Requests  BOTTLES DRAWN AEROBIC AND ANAEROBIC BCAV  Final   Culture NO GROWTH 3 DAYS  Final   Report Status PENDING  Incomplete  Blood Culture (routine x 2)     Status: Abnormal   Collection Time: 09/24/16  6:32 AM  Result Value Ref Range Status   Specimen Description BLOOD R HAND  Final   Special Requests   Final    BOTTLES DRAWN AEROBIC AND ANAEROBIC Blood Culture adequate volume   Culture  Setup Time   Final    GRAM NEGATIVE RODS AEROBIC BOTTLE ONLY CRITICAL RESULT CALLED TO, READ BACK BY AND VERIFIED WITH: HANK ZOMPA ON 09/25/16 AT 5625 QSD Performed at [facility], [address], [place], [place] [address]    Culture ESCHERICHIA COLI (A)  Final   Report Status 09/27/2016 FINAL  Final   Organism ID, Bacteria ESCHERICHIA COLI  Final      Susceptibility   Escherichia coli - MIC*    AMPICILLIN 8 SENSITIVE Sensitive     CEFAZOLIN <=4 SENSITIVE Sensitive     CEFEPIME <=1 SENSITIVE  Sensitive     CEFTAZIDIME <=1 SENSITIVE Sensitive     CEFTRIAXONE <=1 SENSITIVE Sensitive     CIPROFLOXACIN >=4 RESISTANT Resistant     GENTAMICIN <=1 SENSITIVE Sensitive     IMIPENEM <=0.25 SENSITIVE Sensitive     TRIMETH/SULFA <=20 SENSITIVE Sensitive     AMPICILLIN/SULBACTAM 4 SENSITIVE Sensitive     PIP/TAZO <=4 SENSITIVE Sensitive     Extended ESBL NEGATIVE Sensitive     * ESCHERICHIA COLI  Blood Culture ID Panel (Reflexed)     Status: Abnormal   Collection Time: 09/24/16  6:32 AM  Result Value Ref Range Status   Enterococcus species NOT DETECTED NOT DETECTED Final   Listeria monocytogenes NOT DETECTED NOT DETECTED Final   Staphylococcus species NOT DETECTED NOT DETECTED Final   Staphylococcus aureus NOT DETECTED NOT DETECTED Final   Streptococcus species NOT DETECTED NOT DETECTED Final   Streptococcus agalactiae NOT DETECTED NOT DETECTED Final   Streptococcus pneumoniae NOT DETECTED NOT DETECTED Final   Streptococcus pyogenes NOT DETECTED NOT DETECTED Final   Acinetobacter baumannii NOT DETECTED NOT DETECTED Final   Enterobacteriaceae species DETECTED (A) NOT DETECTED Final    Comment: Enterobacteriaceae represent a large family of gram-negative bacteria, not a single organism. CRITICAL RESULT CALLED TO, READ BACK BY AND VERIFIED WITH: HANK ZOMPA ON 09/25/16 AT 0944 QSD    Enterobacter cloacae complex NOT DETECTED NOT DETECTED Final   Escherichia coli DETECTED (A) NOT DETECTED Final    Comment: CRITICAL RESULT CALLED TO, READ BACK BY AND VERIFIED WITH: HANK ZOMPA ON 09/25/16 AT 0944 QSD    Klebsiella oxytoca NOT DETECTED NOT DETECTED Final   Klebsiella pneumoniae NOT DETECTED NOT DETECTED Final   Proteus species NOT DETECTED NOT DETECTED Final   Serratia marcescens NOT DETECTED NOT DETECTED Final   Carbapenem resistance NOT DETECTED NOT DETECTED Final   Haemophilus influenzae NOT DETECTED NOT DETECTED Final   Neisseria meningitidis NOT DETECTED NOT DETECTED Final    Pseudomonas aeruginosa NOT DETECTED NOT DETECTED Final   Candida albicans NOT DETECTED NOT DETECTED Final   Candida glabrata NOT DETECTED NOT DETECTED Final   Candida krusei NOT DETECTED NOT DETECTED Final   Candida parapsilosis NOT DETECTED NOT DETECTED Final   Candida tropicalis NOT DETECTED NOT DETECTED Final  MRSA PCR Screening     Status: None   Collection Time: 09/24/16  9:00 PM  Result Value Ref Range Status  MRSA by PCR NEGATIVE NEGATIVE Final    Comment:        The GeneXpert MRSA Assay (FDA approved for NASAL specimens only), is one component of a comprehensive MRSA colonization surveillance program. It is not intended to diagnose MRSA infection nor to guide or monitor treatment for MRSA infections.     Coagulation Studies: No results for input(s): LABPROT, INR in the last 72 hours.  Urinalysis: No results for input(s): COLORURINE, LABSPEC, PHURINE, GLUCOSEU, HGBUR, BILIRUBINUR, KETONESUR, PROTEINUR, UROBILINOGEN, NITRITE, LEUKOCYTESUR in the last 72 hours.  Invalid input(s): APPERANCEUR    Imaging: No results found.   Medications:   . ceFAZolin Stopped (09/28/16 1703)   . atorvastatin  20 mg Oral q1800  . epoetin (EPOGEN/PROCRIT) injection  10,000 Units Intravenous Q M,W,F-HD  . heparin subcutaneous  5,000 Units Subcutaneous Q8H  . insulin aspart  0-5 Units Subcutaneous QHS  . insulin aspart  0-9 Units Subcutaneous TID WC  . insulin detemir  5 Units Subcutaneous Daily  . lamoTRIgine  100 mg Oral QHS  . levETIRAcetam  500 mg Oral BID  . midodrine  5 mg Oral TID WC  . pantoprazole sodium  40 mg Oral Daily  . sevelamer carbonate  0.8 g Oral TID WC  . sodium chloride flush  3 mL Intravenous Q12H   acetaminophen **OR** acetaminophen, ondansetron **OR** ondansetron (ZOFRAN) IV, senna-docusate  Assessment/ Plan:  [name] is a 83 y.o. black male with end-stage renal disease on hemodialysis, hypertension, diabetes type 2, insulin-dependent,  seizure disorder, peripheral neuropathy, vascular dementia  MWF [facility] [place] Left arm AVF  1. End Stage Renal Disease: MWF.   Continue MWF schedule.   2. Sepsis: E. Coli in blood cultures. Benign abdominal exam. Afebrile.  - Appreciate ID and surgery input. Cefazolin IV, transition to Cephalexin as outpatient  3. Anemia of chronic kidney disease: hemoglobin 10 - epo with HD treatment.  Mircera as outpatient.     4. Secondary Hyperparathyroidism:   - sevelamer with meals.   5. Hypotension: - midodrine.    LOS: [place], Malyn Aytes 6/17/20189:56 AM

## 2016-09-29 NOTE — Clinical Social Work Note (Signed)
CSW has attempted to contact the patient's wife and daughter multiple times to discuss transport for the patient to [facility]. Neither has answered and neither has the ability for the CSW to leave voice mail. CSW has delivered packet. The facility is aware of discharge. CSW will continue to follow pending additional discharge needs.  Santiago Bumpers, MSW, Latanya Presser [phone]

## 2016-09-29 NOTE — Plan of Care (Signed)
Problem: Safety: Goal: Ability to remain free from injury will improve Outcome: Progressing Pt is a high fall risk, exit alarm activated and patient is assisted with activity in his room .

## 2016-09-29 NOTE — Discharge Summary (Signed)
Michael Acosta at [facility] NAME: Michael Acosta    MR#:  [id number]  DATE OF BIRTH:  08-Oct-1929  DATE OF ADMISSION:  09/24/2016 ADMITTING PHYSICIAN: Saundra Shelling, MD  DATE OF DISCHARGE: 09/29/2016  PRIMARY CARE PHYSICIAN: Marden Noble, MD    ADMISSION DIAGNOSIS:  HCAP (healthcare-associated pneumonia) [J18.9] Sepsis, due to unspecified organism ([place]) [A41.9] Fever, unspecified fever cause [R50.9] Hypotension, unspecified hypotension type [I95.9]  DISCHARGE DIAGNOSIS:  Principal Problem:   Septic shock (Michael Acosta) Active Problems:   CAP (community acquired pneumonia)   Cholecystitis   Elevated troponin   ESRD on hemodialysis ([place])   Dementia   PVD (peripheral vascular disease) ([place])   IDDM (insulin dependent diabetes mellitus) ([place])   Bacteremia   DNR (do not resuscitate) discussion   Palliative care by specialist   Sepsis ([place])   Weakness generalized   SECONDARY DIAGNOSIS:   Past Medical History:  Diagnosis Date  . Anemia of chronic disease   . Chronic pain disorder   . Dementia   . Depression   . ESRD on hemodialysis ([place])    a. MWF  . Falls frequently   . GERD (gastroesophageal reflux disease)   . Hypertension   . Insulin dependent diabetes mellitus with complications ([place])   . Mood disorder ([place])   . Peripheral neuropathy    a. diabetic neuropathy  . Peripheral vascular disease ([place])    a. s/p prior toe amputation; b. followed by [facility]  . Prostate cancer ([place])   . Seizures ([place])    EPILEPSY  . Stroke [facility])    TIA  . Weakness generalized     HOSPITAL COURSE:   83 year old male with past medical history of end-stage renal disease on hemodialysis, history of previous CVA, seizures, peripheral vascular disease, peripheral neuropathy, diabetes, dementia, depression, anemia of chronic disease who presented to the hospital due to shortness of breath and hypoxia  1. Sepsis-patient presented with a fever, tachycardia and  suspected pneumonia given his hypoxia. -Patient's blood cultures although were positive for Escherichia coli. Source of the sepsis is thought to be biliary in nature now given patient's ultrasound findings of gall bladder wall thickening and pericholecystic fluid.   -Patient denied any abdominal pain nausea vomiting and is tolerating PO well. -Patient's LFTs and lipase have improved. He is currently afebrile. The patient was on IV meropenem initially and then narrowed down to IV Ancef and as per infectious disease he can now be discharged on Oral Keflex for 14 days.  - he is afebrile and hemodynamically stable.    2. Abnormal LFTs-suspected to be secondary to gallstone pancreatitis. Patient's lipase and LFTs were elevated but has much improved since admission.  He is clinically asymptomatic with no Abdominal pain, N/V and tolerating PO well.  - follow LFT's as outpatient in next 2-3 days.   3. Diabetes type 2 without complication- BS have remained stable and pt. Will cont. His Levemir, Tradjenta, Novolog with meals.    4. Secondary hyperparathyroidism - he will cont. Renvela  5. Hx of Seizures -  He will cont. Keppra, Lamictal. No acute seizures while in the hospital.   6. Hyperlipidemia - he will cont. Atorvastatin  7. GERD - he will cont. His Omeprazole.    8. ESRD on HD - nephrology was consulted and he was maintained on his HD on MWF and he will cont. That upon discharged.    DISCHARGE CONDITIONS:   Stable.   CONSULTS OBTAINED:  Treatment Team:  Lavonia Dana, MD Leonel Ramsay, MD  DRUG ALLERGIES:   Allergies  Allergen Reactions  . Aspirin   . Bc Powder [Aspirin-Salicylamide-Caffeine]   . Penicillins Other (See Comments)    Has patient had a PCN reaction causing immediate rash, facial/tongue/throat swelling, SOB or lightheadedness with hypotension: Unknown Has patient had a PCN reaction causing severe rash involving mucus membranes or skin necrosis:  Unknown Has patient had a PCN reaction that required hospitalization: Unknown Has patient had a PCN reaction occurring within the last 10 years: Unknown If all of the above answers are "NO", then may proceed with Cephalosporin use.  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    DISCHARGE MEDICATIONS:   Allergies as of 09/29/2016      Reactions   Aspirin    Bc Powder [aspirin-salicylamide-caffeine]    Penicillins Other (See Comments)   Has patient had a PCN reaction causing immediate rash, facial/tongue/throat swelling, SOB or lightheadedness with hypotension: Unknown Has patient had a PCN reaction causing severe rash involving mucus membranes or skin necrosis: Unknown Has patient had a PCN reaction that required hospitalization: Unknown Has patient had a PCN reaction occurring within the last 10 years: Unknown If all of the above answers are "NO", then may proceed with Cephalosporin use. 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      Medication List    STOP taking these medications   BASAGLAR KWIKPEN 100 UNIT/ML Sopn     TAKE these medications   atorvastatin 20 MG tablet Commonly known as:  LIPITOR Take 20 mg by mouth every evening.   cephALEXin 500 MG capsule Commonly known as:  KEFLEX Take 1 capsule (500 mg total) by mouth 2 (two) times daily.   docusate sodium 100 MG capsule Commonly known as:  COLACE Take 100 mg by mouth 2 (two) times daily as needed for mild constipation.   gabapentin 100 MG capsule Commonly known as:  NEURONTIN Take 100 mg by mouth at bedtime.   guaifenesin 100 MG/5ML syrup Commonly known as:  ROBITUSSIN Take 200 mg by mouth every 6 (six) hours as needed for cough.   HYDROcodone-acetaminophen 5-325 MG tablet Commonly known as:  NORCO/VICODIN Take 1 tablet by mouth every 6 (six) hours as needed for moderate pain or severe pain.   insulin aspart 100 UNIT/ML injection Commonly known as:  novoLOG Inject 0-10 Units into the skin 4 (four) times daily -  before meals and at bedtime. Per sliding  scale   lamoTRIgine 100 MG tablet Commonly known as:  LAMICTAL Take 50-100 mg by mouth 2 (two) times daily. 50 mg by mouth every morning and 100 mg by mouth at bedtime   LEVEMIR FLEXPEN 100 UNIT/ML Pen Generic drug:  Insulin Detemir Inject 15 Units into the skin at bedtime.   levETIRAcetam 500 MG tablet Commonly known as:  KEPPRA Take 500 mg by mouth daily.   linagliptin 5 MG Tabs tablet Commonly known as:  TRADJENTA Take 5 mg by mouth every evening.   metoCLOPramide 5 MG tablet Commonly known as:  REGLAN Take 5 mg by mouth 3 (three) times daily.   midodrine 5 MG tablet Commonly known as:  PROAMATINE Take 1 tablet (5 mg total) by mouth 3 (three) times daily with meals.   omeprazole 20 MG capsule Commonly known as:  PRILOSEC Take 20 mg by mouth daily.   ondansetron 4 MG tablet Commonly known as:  ZOFRAN Take 4 mg by mouth every 8 (eight) hours as needed for nausea or vomiting.   PHENERGAN 25 MG/ML injection Generic  drug:  promethazine Inject 25 mg into the muscle every 6 (six) hours as needed for nausea or vomiting.   sevelamer carbonate 800 MG tablet Commonly known as:  RENVELA Take 800 mg by mouth 3 (three) times daily.         DISCHARGE INSTRUCTIONS:   DIET:  Diabetic diet and Renal diet Dysphagia II with Nectar thick liquids and aspiration precautions.    DISCHARGE CONDITION:  Stable  ACTIVITY:  Activity as tolerated  OXYGEN:  Home Oxygen: No.   Oxygen Delivery: room air  DISCHARGE LOCATION:  nursing home   If you experience worsening of your admission symptoms, develop shortness of breath, life threatening emergency, suicidal or homicidal thoughts you must seek medical attention immediately by calling 911 or calling your MD immediately  if symptoms less severe.  You Must read complete instructions/literature along with all the possible adverse reactions/side effects for all the Medicines you take and that have been prescribed to you. Take any  new Medicines after you have completely understood and accpet all the possible adverse reactions/side effects.   Please note  You were cared for by a hospitalist during your hospital stay. If you have any questions about your discharge medications or the care you received while you were in the hospital after you are discharged, you can call the unit and asked to speak with the hospitalist on call if the hospitalist that took care of you is not available. Once you are discharged, your primary care physician will handle any further medical issues. Please note that NO REFILLS for any discharge medications will be authorized once you are discharged, as it is imperative that you return to your primary care physician (or establish a relationship with a primary care physician if you do not have one) for your aftercare needs so that they can reassess your need for medications and monitor your lab values.     Today   No abdominal pain, N/V.  LFT's, Lipase improved.  Afebrile, hemodynamically stable.   VITAL SIGNS:  Blood pressure 120/65, pulse 75, temperature 98.3 F (36.8 C), temperature source Oral, resp. rate 18, height 5\' 6"  (1.676 m), weight 67.8 kg (149 lb 6.4 oz), SpO2 98 %.  I/O:   Intake/Output Summary (Last 24 hours) at 09/29/16 1112 Last data filed at 09/29/16 0930  Gross per 24 hour  Intake              360 ml  Output                0 ml  Net              360 ml    PHYSICAL EXAMINATION:   Physical Exam  GENERAL:  83 y.o.-year-old patient sitting up in chair eating breakfast.  EYES: Pupils equal, round, reactive to light and accommodation. No scleral icterus. Extraocular muscles intact.  HEENT: Head atraumatic, normocephalic. Oropharynx and nasopharynx clear.  NECK:  Supple, no jugular venous distention. No thyroid enlargement, no tenderness.  LUNGS: Normal breath sounds bilaterally, no wheezing, rales, rhonchi. No use of accessory muscles of respiration.  CARDIOVASCULAR: S1,  S2 normal. No murmurs, rubs, or gallops.  ABDOMEN: Soft, nontender, nondistended. Bowel sounds present. No organomegaly or mass.  EXTREMITIES: No cyanosis, clubbing or edema b/l.    NEUROLOGIC: Cranial nerves II through XII are intact. No focal Motor or sensory deficits b/l.   PSYCHIATRIC: The patient is alert and oriented x 3.  SKIN: No obvious rash, lesion, or ulcer.  Left upper extremity AV fistula with good bruit and thrill.   DATA REVIEW:   CBC  Recent Labs Lab 09/28/16 0937  WBC 8.1  HGB 10.0*  HCT 30.3*  PLT 211    Chemistries   Recent Labs Lab 09/25/16 0959  09/29/16 0535  NA  --   < > 139  K  --   < > 4.1  CL  --   < > 98*  CO2  --   < > 31  GLUCOSE  --   < > 178*  BUN  --   < > 53*  CREATININE  --   < > 5.91*  CALCIUM  --   < > 8.3*  MG 1.8  --   --   AST  --   < > 38  ALT  --   < > 29  ALKPHOS  --   < > 178*  BILITOT  --   < > 0.6  < > = values in this interval not displayed.  Cardiac Enzymes  Recent Labs Lab 09/25/16 2323  TROPONINI 0.79*    Microbiology Results  Results for orders placed or performed during the hospital encounter of 09/24/16  Blood Culture (routine x 2)     Status: None (Preliminary result)   Collection Time: 09/24/16  6:20 AM  Result Value Ref Range Status   Specimen Description BLOOD ARTERIAL DRAW FROM R ARM  Final   Special Requests BOTTLES DRAWN AEROBIC AND ANAEROBIC BCAV  Final   Culture NO GROWTH 3 DAYS  Final   Report Status PENDING  Incomplete  Blood Culture (routine x 2)     Status: Abnormal   Collection Time: 09/24/16  6:32 AM  Result Value Ref Range Status   Specimen Description BLOOD R HAND  Final   Special Requests   Final    BOTTLES DRAWN AEROBIC AND ANAEROBIC Blood Culture adequate volume   Culture  Setup Time   Final    GRAM NEGATIVE RODS AEROBIC BOTTLE ONLY CRITICAL RESULT CALLED TO, READ BACK BY AND VERIFIED WITH: HANK ZOMPA ON 09/25/16 AT 2355 QSD Performed at [facility], [place] [address] [place], [place] [address]    Culture ESCHERICHIA COLI (A)  Final   Report Status 09/27/2016 FINAL  Final   Organism ID, Bacteria ESCHERICHIA COLI  Final      Susceptibility   Escherichia coli - MIC*    AMPICILLIN 8 SENSITIVE Sensitive     CEFAZOLIN <=4 SENSITIVE Sensitive     CEFEPIME <=1 SENSITIVE Sensitive     CEFTAZIDIME <=1 SENSITIVE Sensitive     CEFTRIAXONE <=1 SENSITIVE Sensitive     CIPROFLOXACIN >=4 RESISTANT Resistant     GENTAMICIN <=1 SENSITIVE Sensitive     IMIPENEM <=0.25 SENSITIVE Sensitive     TRIMETH/SULFA <=20 SENSITIVE Sensitive     AMPICILLIN/SULBACTAM 4 SENSITIVE Sensitive     PIP/TAZO <=4 SENSITIVE Sensitive     Extended ESBL NEGATIVE Sensitive     * ESCHERICHIA COLI  Blood Culture ID Panel (Reflexed)     Status: Abnormal   Collection Time: 09/24/16  6:32 AM  Result Value Ref Range Status   Enterococcus species NOT DETECTED NOT DETECTED Final   Listeria monocytogenes NOT DETECTED NOT DETECTED Final   Staphylococcus species NOT DETECTED NOT DETECTED Final   Staphylococcus aureus NOT DETECTED NOT DETECTED Final   Streptococcus species NOT DETECTED NOT DETECTED Final   Streptococcus agalactiae NOT DETECTED NOT DETECTED Final   Streptococcus  pneumoniae NOT DETECTED NOT DETECTED Final   Streptococcus pyogenes NOT DETECTED NOT DETECTED Final   Acinetobacter baumannii NOT DETECTED NOT DETECTED Final   Enterobacteriaceae species DETECTED (A) NOT DETECTED Final    Comment: Enterobacteriaceae represent a large family of gram-negative bacteria, not a single organism. CRITICAL RESULT CALLED TO, READ BACK BY AND VERIFIED WITH: HANK ZOMPA ON 09/25/16 AT 0944 QSD    Enterobacter cloacae complex NOT DETECTED NOT DETECTED Final   Escherichia coli DETECTED (A) NOT DETECTED Final    Comment: CRITICAL RESULT CALLED TO, READ BACK BY AND VERIFIED WITH: HANK ZOMPA ON 09/25/16 AT 0944 QSD    Klebsiella oxytoca NOT DETECTED NOT DETECTED Final   Klebsiella pneumoniae NOT DETECTED  NOT DETECTED Final   Proteus species NOT DETECTED NOT DETECTED Final   Serratia marcescens NOT DETECTED NOT DETECTED Final   Carbapenem resistance NOT DETECTED NOT DETECTED Final   Haemophilus influenzae NOT DETECTED NOT DETECTED Final   Neisseria meningitidis NOT DETECTED NOT DETECTED Final   Pseudomonas aeruginosa NOT DETECTED NOT DETECTED Final   Candida albicans NOT DETECTED NOT DETECTED Final   Candida glabrata NOT DETECTED NOT DETECTED Final   Candida krusei NOT DETECTED NOT DETECTED Final   Candida parapsilosis NOT DETECTED NOT DETECTED Final   Candida tropicalis NOT DETECTED NOT DETECTED Final  MRSA PCR Screening     Status: None   Collection Time: 09/24/16  9:00 PM  Result Value Ref Range Status   MRSA by PCR NEGATIVE NEGATIVE Final    Comment:        The GeneXpert MRSA Assay (FDA approved for NASAL specimens only), is one component of a comprehensive MRSA colonization surveillance program. It is not intended to diagnose MRSA infection nor to guide or monitor treatment for MRSA infections.     RADIOLOGY:  No results found.    Management plans discussed with the patient, family and they are in agreement.  CODE STATUS:     Code Status Orders        Start     Ordered   09/25/16 1426  Limited resuscitation (code)  Continuous    Question Answer Comment  In the event of cardiac or respiratory ARREST: Initiate Code Blue, Call Rapid Response Yes   In the event of cardiac or respiratory ARREST: Perform CPR No   In the event of cardiac or respiratory ARREST: Perform Intubation/Mechanical Ventilation No   In the event of cardiac or respiratory ARREST: Use NIPPV/BiPAp only if indicated Yes   In the event of cardiac or respiratory ARREST: Administer ACLS medications if indicated Yes   In the event of cardiac or respiratory ARREST: Perform Defibrillation or Cardioversion if indicated No      09/25/16 1425     TOTAL TIME TAKING CARE OF THIS PATIENT: 40 minutes.     Henreitta Leber M.D on 09/29/2016 at 11:12 AM  Between 7am to 6pm - Pager - [phone]  After 6pm go to [email or web address] - Proofreader  [facility]  Office  [phone]  CC: Primary care physician; Marden Noble, MD

## 2016-10-04 ENCOUNTER — Encounter: Payer: Self-pay | Admitting: Infectious Diseases

## 2016-10-04 DIAGNOSIS — K851 Biliary acute pancreatitis without necrosis or infection: Secondary | ICD-10-CM | POA: Insufficient documentation

## 2016-11-05 ENCOUNTER — Encounter (INDEPENDENT_AMBULATORY_CARE_PROVIDER_SITE_OTHER): Payer: Self-pay | Admitting: Vascular Surgery

## 2016-11-05 ENCOUNTER — Ambulatory Visit (INDEPENDENT_AMBULATORY_CARE_PROVIDER_SITE_OTHER): Payer: Medicare Other | Admitting: Vascular Surgery

## 2016-11-05 ENCOUNTER — Other Ambulatory Visit (INDEPENDENT_AMBULATORY_CARE_PROVIDER_SITE_OTHER): Payer: Self-pay

## 2016-11-05 ENCOUNTER — Encounter (INDEPENDENT_AMBULATORY_CARE_PROVIDER_SITE_OTHER): Payer: Self-pay

## 2016-11-05 VITALS — BP 146/84 | HR 62 | Resp 16 | Ht 66.5 in | Wt 145.0 lb

## 2016-11-05 DIAGNOSIS — IMO0001 Reserved for inherently not codable concepts without codable children: Secondary | ICD-10-CM

## 2016-11-05 DIAGNOSIS — N186 End stage renal disease: Secondary | ICD-10-CM

## 2016-11-05 DIAGNOSIS — E119 Type 2 diabetes mellitus without complications: Secondary | ICD-10-CM | POA: Diagnosis not present

## 2016-11-05 DIAGNOSIS — F0390 Unspecified dementia without behavioral disturbance: Secondary | ICD-10-CM | POA: Diagnosis not present

## 2016-11-05 DIAGNOSIS — Z794 Long term (current) use of insulin: Secondary | ICD-10-CM | POA: Diagnosis not present

## 2016-11-05 DIAGNOSIS — I70261 Atherosclerosis of native arteries of extremities with gangrene, right leg: Secondary | ICD-10-CM | POA: Diagnosis not present

## 2016-11-05 DIAGNOSIS — Z992 Dependence on renal dialysis: Secondary | ICD-10-CM

## 2016-11-05 DIAGNOSIS — I70269 Atherosclerosis of native arteries of extremities with gangrene, unspecified extremity: Secondary | ICD-10-CM | POA: Insufficient documentation

## 2016-11-05 NOTE — Patient Instructions (Signed)

## 2016-11-05 NOTE — Assessment & Plan Note (Signed)
This represents a critical limb threatening situation. I had a long discussion today with the patient and his wife. I am highly concerned about his toe and overall and concern for major limb loss. With diabetes, renal failure, dementia is at very high risk for major amputation. I have recommended we go ahead and proceed with angiography. He is revascularization procedures although these have been years ago. Pending the findings of angiogram, revascularization is reasonable. Open surgical therapy could be considered, but he would not be a great candidate given his age and comorbidities. This will be scheduled for the very near future at his convenience. Risks and benefits are discussed. He and his wife understand this is clearly a critical and limb threatening situation.

## 2016-11-05 NOTE — Progress Notes (Signed)
Patient ID: Michael Acosta, male   DOB: 1929/07/28, 83 y.o.   MRN: [id number]  Chief Complaint  Patient presents with  . New Evaluation    HPI Michael Acosta is a 83 y.o. male.  I am asked to see the patient by Dr. Cleda Mccreedy for evaluation of gangrenous toe.  The patient reports Several weeks of dark discoloration to the tip of the right second toe. Dr. Cleda Mccreedy has debridement of the gangrenous area but there is still a significant open wound on the second toe. He has already lost the tip of his right first toe. He has had multiple previous vascular procedures to the lower extremities but none in the last several years. He has a litany of atherosclerotic risk factors and is on dialysis. He says there is pain in the foot and the toe. The pain is described as a shooting and stabbing pain that goes up the leg. He denies any fever or chills. There was no trauma, injury, or inciting event that started the symptoms.   Past Medical History:  Diagnosis Date  . Anemia of chronic disease   . Chronic pain disorder   . Dementia   . Depression   . ESRD on hemodialysis ([place])    a. MWF  . Falls frequently   . GERD (gastroesophageal reflux disease)   . Hypertension   . Insulin dependent diabetes mellitus with complications ([place])   . Mood disorder ([place])   . Peripheral neuropathy    a. diabetic neuropathy  . Peripheral vascular disease ([place])    a. s/p prior toe amputation; b. followed by [facility]  . Prostate cancer ([place])   . Seizures ([place])    EPILEPSY  . Stroke [facility])    TIA  . Weakness generalized     Past Surgical History:  Procedure Laterality Date  . COLON SURGERY     RESECTION  . INSERTION OF DIALYSIS CATHETER     SHUNT  . LEG SURGERY Left    MULTIPLE VEIN SURGERIES  . TOTAL HIP ARTHROPLASTY Left     Family History  Problem Relation Age of Onset  . CAD Neg Hx   . Diabetes Neg Hx   No bleeding disorders, clotting disorders, or aneurysms  Social History Social History  Substance  Use Topics  . Smoking status: Never Smoker  . Smokeless tobacco: Never Used  . Alcohol use No  No IVDU  Allergies  Allergen Reactions  . Aspirin   . Bc Powder [Aspirin-Salicylamide-Caffeine]   . Penicillins Other (See Comments)    Has patient had a PCN reaction causing immediate rash, facial/tongue/throat swelling, SOB or lightheadedness with hypotension: Unknown Has patient had a PCN reaction causing severe rash involving mucus membranes or skin necrosis: Unknown Has patient had a PCN reaction that required hospitalization: Unknown Has patient had a PCN reaction occurring within the last 10 years: Unknown If all of the above answers are "NO", then may proceed with Cephalosporin use.  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    Current Outpatient Prescriptions  Medication Sig Dispense Refill  . atorvastatin (LIPITOR) 20 MG tablet Take 20 mg by mouth every evening.     . docusate sodium (COLACE) 100 MG capsule Take 100 mg by mouth 2 (two) times daily as needed for mild constipation.    . gabapentin (NEURONTIN) 100 MG capsule Take 100 mg by mouth at bedtime.    [name] HYDROcodone-acetaminophen (NORCO/VICODIN) 5-325 MG tablet Take 1 tablet by mouth every 6 (six) hours as needed for  moderate pain or severe pain. 15 tablet 0  . insulin aspart (NOVOLOG) 100 UNIT/ML injection Inject 0-10 Units into the skin 4 (four) times daily -  before meals and at bedtime. Per sliding scale    . Insulin Detemir (LEVEMIR FLEXPEN) 100 UNIT/ML Pen Inject 15 Units into the skin at bedtime.     . lamoTRIgine (LAMICTAL) 100 MG tablet Take 50-100 mg by mouth 2 (two) times daily. 50 mg by mouth every morning and 100 mg by mouth at bedtime    . levETIRAcetam (KEPPRA) 500 MG tablet Take 500 mg by mouth daily.    [name] linagliptin (TRADJENTA) 5 MG TABS tablet Take 5 mg by mouth every evening.    . metoCLOPramide (REGLAN) 5 MG tablet Take 5 mg by mouth 3 (three) times daily.    . midodrine (PROAMATINE) 5 MG tablet Take 1 tablet (5 mg total) by mouth  3 (three) times daily with meals. 90 tablet 3  . omeprazole (PRILOSEC) 20 MG capsule Take 20 mg by mouth daily.     . ondansetron (ZOFRAN) 4 MG tablet Take 4 mg by mouth every 8 (eight) hours as needed for nausea or vomiting.     . sevelamer carbonate (RENVELA) 800 MG tablet Take 800 mg by mouth 3 (three) times daily.     [name] sulfamethoxazole-trimethoprim (BACTRIM DS,SEPTRA DS) 800-160 MG tablet Take by mouth.    . guaifenesin (ROBITUSSIN) 100 MG/5ML syrup Take 200 mg by mouth every 6 (six) hours as needed for cough.     . promethazine (PHENERGAN) 25 MG/ML injection Inject 25 mg into the muscle every 6 (six) hours as needed for nausea or vomiting.     No current facility-administered medications for this visit.       REVIEW OF SYSTEMS (Negative unless checked)  Constitutional: [] Weight loss  [] Fever  [] Chills Cardiac: [] Chest pain   [] Chest pressure   [] Palpitations   [] Shortness of breath when laying flat   [] Shortness of breath at rest   [] Shortness of breath with exertion. Vascular:  [] Pain in legs with walking   [] Pain in legs at rest   [] Pain in legs when laying flat   [] Claudication   [] Pain in feet when walking  [x] Pain in feet at rest  [] Pain in feet when laying flat   [] History of DVT   [] Phlebitis   [] Swelling in legs   [] Varicose veins   [x] Non-healing ulcers Pulmonary:   [] Uses home oxygen   [] Productive cough   [] Hemoptysis   [] Wheeze  [] COPD   [] Asthma Neurologic:  [] Dizziness  [] Blackouts   [x] Seizures   [x] History of stroke   [x] History of TIA  [] Aphasia   [] Temporary blindness   [] Dysphagia   [] Weakness or numbness in arms   [] Weakness or numbness in legs Musculoskeletal:  [] Arthritis   [] Joint swelling   [] Joint pain   [] Low back pain Hematologic:  [] Easy bruising  [] Easy bleeding   [] Hypercoagulable state   [x] Anemic  [] Hepatitis Gastrointestinal:  [] Blood in stool   [] Vomiting blood  [x] Gastroesophageal reflux/heartburn   [] Abdominal pain Genitourinary:  [x] Chronic kidney  disease   [] Difficult urination  [] Frequent urination  [] Burning with urination   [] Hematuria Skin:  [] Rashes   [x] Ulcers   [x] Wounds Psychological:  [] History of anxiety   []  History of major depression.    Physical Exam BP (!) 146/84   Pulse 62   Resp 16   Ht 5' 6.5" (1.689 m)   Wt 145 lb (65.8 kg)   BMI 23.05 kg/m  Gen:  WD/WN, NAD Head: [place]/AT, No temporalis wasting.  Ear/Nose/Throat: Hearing grossly intact, nares w/o erythema or drainage, oropharynx w/o Erythema/Exudate Eyes: Conjunctiva clear, sclera non-icteric  Neck: trachea midline.  No JVD.  Pulmonary:  Good air movement, respirations not labored, no use of accessory muscles Cardiac: Irregular Vascular: Good thrill is present in his access. Vessel Right Left  Radial Palpable Palpable                      Popliteal Not Palpable Not Palpable  PT Trace Palpable 1+ Palpable  DP 1+ Palpable 1+ Palpable   Gastrointestinal: soft, non-tender/non-distended.  Musculoskeletal: Diffusely weak. Uses a wheelchair. Tip of the right second toe with circular 1-2 cm ulceration with pale tissue within.  Neurologic: Sensation grossly intact in extremities.  Symmetrical.  Speech is fluent. Motor exam as listed above. Psychiatric: Judgment intact, Mood & affect appropriate for pt's clinical situation. Dermatologic: open wound on the right second toe as described above.  Radiology No results found.  Labs Recent Results (from the past 2160 hour(s))  Lactic acid, plasma     Status: Abnormal   Collection Time: 09/24/16  5:05 AM  Result Value Ref Range   Lactic Acid, Venous 3.2 (HH) 0.5 - 1.9 mmol/L    Comment: CRITICAL RESULT CALLED TO, READ BACK BY AND VERIFIED WITH KALA KEEN ON 09/24/16 AT 0600 QSD   Blood gas, arterial ([facility], [facility], [facility])     Status: Abnormal   Collection Time: 09/24/16  5:05 AM  Result Value Ref Range   FIO2 1.00    Delivery systems NON-REBREATHER OXYGEN MASK    pH, Arterial 7.48 (H) 7.350 - 7.450   pCO2  arterial 41 32.0 - 48.0 mmHg   pO2, Arterial 81 (L) 83.0 - 108.0 mmHg   Bicarbonate 30.5 (H) 20.0 - 28.0 mmol/L   Acid-Base Excess 6.4 (H) 0.0 - 2.0 mmol/L   O2 Saturation 96.7 %   Patient temperature 37.0    Collection site REVIEWED BY    Sample type ARTERIAL DRAW    Allens test (pass/fail) PASS PASS  Blood Culture (routine x 2)     Status: None   Collection Time: 09/24/16  6:20 AM  Result Value Ref Range   Specimen Description BLOOD ARTERIAL DRAW FROM R ARM    Special Requests BOTTLES DRAWN AEROBIC AND ANAEROBIC BCAV    Culture NO GROWTH 5 DAYS    Report Status 09/29/2016 FINAL   Blood Culture (routine x 2)     Status: Abnormal   Collection Time: 09/24/16  6:32 AM  Result Value Ref Range   Specimen Description BLOOD R HAND    Special Requests      BOTTLES DRAWN AEROBIC AND ANAEROBIC Blood Culture adequate volume   Culture  Setup Time      GRAM NEGATIVE RODS AEROBIC BOTTLE ONLY CRITICAL RESULT CALLED TO, READ BACK BY AND VERIFIED WITH: HANK ZOMPA ON 09/25/16 AT 0626 QSD Performed at [facility], [address], [place], [place] [address]    Culture ESCHERICHIA COLI (A)    Report Status 09/27/2016 FINAL    Organism ID, Bacteria ESCHERICHIA COLI       Susceptibility   Escherichia coli - MIC*    AMPICILLIN 8 SENSITIVE Sensitive     CEFAZOLIN <=4 SENSITIVE Sensitive     CEFEPIME <=1 SENSITIVE Sensitive     CEFTAZIDIME <=1 SENSITIVE Sensitive     CEFTRIAXONE <=1 SENSITIVE Sensitive     CIPROFLOXACIN >=4  RESISTANT Resistant     GENTAMICIN <=1 SENSITIVE Sensitive     IMIPENEM <=0.25 SENSITIVE Sensitive     TRIMETH/SULFA <=20 SENSITIVE Sensitive     AMPICILLIN/SULBACTAM 4 SENSITIVE Sensitive     PIP/TAZO <=4 SENSITIVE Sensitive     Extended ESBL NEGATIVE Sensitive     * ESCHERICHIA COLI  Blood Culture ID Panel (Reflexed)     Status: Abnormal   Collection Time: 09/24/16  6:32 AM  Result Value Ref Range   Enterococcus species NOT DETECTED NOT DETECTED   Listeria  monocytogenes NOT DETECTED NOT DETECTED   Staphylococcus species NOT DETECTED NOT DETECTED   Staphylococcus aureus NOT DETECTED NOT DETECTED   Streptococcus species NOT DETECTED NOT DETECTED   Streptococcus agalactiae NOT DETECTED NOT DETECTED   Streptococcus pneumoniae NOT DETECTED NOT DETECTED   Streptococcus pyogenes NOT DETECTED NOT DETECTED   Acinetobacter baumannii NOT DETECTED NOT DETECTED   Enterobacteriaceae species DETECTED (A) NOT DETECTED    Comment: Enterobacteriaceae represent a large family of gram-negative bacteria, not a single organism. CRITICAL RESULT CALLED TO, READ BACK BY AND VERIFIED WITH: HANK ZOMPA ON 09/25/16 AT 0944 QSD    Enterobacter cloacae complex NOT DETECTED NOT DETECTED   Escherichia coli DETECTED (A) NOT DETECTED    Comment: CRITICAL RESULT CALLED TO, READ BACK BY AND VERIFIED WITH: HANK ZOMPA ON 09/25/16 AT 0944 QSD    Klebsiella oxytoca NOT DETECTED NOT DETECTED   Klebsiella pneumoniae NOT DETECTED NOT DETECTED   Proteus species NOT DETECTED NOT DETECTED   Serratia marcescens NOT DETECTED NOT DETECTED   Carbapenem resistance NOT DETECTED NOT DETECTED   Haemophilus influenzae NOT DETECTED NOT DETECTED   Neisseria meningitidis NOT DETECTED NOT DETECTED   Pseudomonas aeruginosa NOT DETECTED NOT DETECTED   Candida albicans NOT DETECTED NOT DETECTED   Candida glabrata NOT DETECTED NOT DETECTED   Candida krusei NOT DETECTED NOT DETECTED   Candida parapsilosis NOT DETECTED NOT DETECTED   Candida tropicalis NOT DETECTED NOT DETECTED  Comprehensive metabolic panel     Status: Abnormal   Collection Time: 09/24/16  6:38 AM  Result Value Ref Range   Sodium 142 135 - 145 mmol/L   Potassium 4.2 3.5 - 5.1 mmol/L   Chloride 95 (L) 101 - 111 mmol/L   CO2 31 22 - 32 mmol/L   Glucose, Bld 206 (H) 65 - 99 mg/dL   BUN 48 (H) 6 - 20 mg/dL   Creatinine, Ser 5.73 (H) 0.61 - 1.24 mg/dL   Calcium 9.4 8.9 - 10.3 mg/dL   Total Protein 8.3 (H) 6.5 - 8.1 g/dL   Albumin  3.7 3.5 - 5.0 g/dL   AST 243 (H) 15 - 41 U/L   ALT 198 (H) 17 - 63 U/L   Alkaline Phosphatase 258 (H) 38 - 126 U/L   Total Bilirubin 2.5 (H) 0.3 - 1.2 mg/dL   GFR calc non Af Amer 8 (L) >60 mL/min   GFR calc Af Amer 9 (L) >60 mL/min    Comment: (NOTE) The eGFR has been calculated using the CKD EPI equation. This calculation has not been validated in all clinical situations. eGFR's persistently <60 mL/min signify possible Chronic Kidney Disease.    Anion gap 16 (H) 5 - 15  CBC WITH DIFFERENTIAL     Status: Abnormal   Collection Time: 09/24/16  6:38 AM  Result Value Ref Range   WBC 21.1 (H) 3.8 - 10.6 K/uL   RBC 3.64 (L) 4.40 - 5.90 MIL/uL   Hemoglobin 11.0 (  L) 13.0 - 18.0 g/dL   HCT 33.8 (L) 40.0 - 52.0 %   MCV 93.1 80.0 - 100.0 fL   MCH 30.3 26.0 - 34.0 pg   MCHC 32.6 32.0 - 36.0 g/dL   RDW 16.0 (H) 11.5 - 14.5 %   Platelets 196 150 - 440 K/uL   Neutrophils Relative % 94 %   Lymphocytes Relative 2 %   Monocytes Relative 4 %   Eosinophils Relative 0 %   Basophils Relative 0 %   Band Neutrophils 0 %   Metamyelocytes Relative 0 %   Myelocytes 0 %   Promyelocytes Absolute 0 %   Blasts 0 %   nRBC 0 0 /100 WBC   Other 0 %   Neutro Abs 19.9 (H) 1.4 - 6.5 K/uL   Lymphs Abs 0.4 (L) 1.0 - 3.6 K/uL   Monocytes Absolute 0.8 0.2 - 1.0 K/uL   Eosinophils Absolute 0.0 0 - 0.7 K/uL   Basophils Absolute 0.0 0 - 0.1 K/uL  Lipase, blood     Status: Abnormal   Collection Time: 09/24/16  6:38 AM  Result Value Ref Range   Lipase 2,491 (H) 11 - 51 U/L    Comment: RESULT CONFIRMED BY MANUAL DILUTION ALV  Troponin I     Status: Abnormal   Collection Time: 09/24/16  6:38 AM  Result Value Ref Range   Troponin I 0.07 (HH) <0.03 ng/mL    Comment: CRITICAL RESULT CALLED TO, READ BACK BY AND VERIFIED WITH SAMANTHA HAMILTON AT 3335 ON 09/24/16 ALV   Hepatitis panel, acute     Status: None   Collection Time: 09/24/16  6:38 AM  Result Value Ref Range   Hepatitis B Surface Ag Negative Negative     HCV Ab <0.1 0.0 - 0.9 s/co ratio    Comment: (NOTE)                                  Negative:     < 0.8                             Indeterminate: 0.8 - 0.9                                  Positive:     > 0.9 The CDC recommends that a positive HCV antibody result be followed up with a HCV Nucleic Acid Amplification test (456256). Performed At: [facility] [place], [place] [id number] Lindon Romp MD [id number]    Hep A IgM Negative Negative   Hep B C IgM Negative Negative  Glucose, capillary     Status: Abnormal   Collection Time: 09/24/16  8:58 AM  Result Value Ref Range   Glucose-Capillary 207 (H) 65 - 99 mg/dL  Lactic acid, plasma     Status: Abnormal   Collection Time: 09/24/16  9:38 AM  Result Value Ref Range   Lactic Acid, Venous 2.1 (HH) 0.5 - 1.9 mmol/L    Comment: CRITICAL RESULT CALLED TO, READ BACK BY AND VERIFIED WITH MYRA FLOWERS AT 1023 ON 09/24/16 ALV   Lactic acid, plasma     Status: None   Collection Time: 09/24/16 11:10 AM  Result Value Ref Range   Lactic Acid, Venous 1.7 0.5 - 1.9 mmol/L  Troponin I  Status: Abnormal   Collection Time: 09/24/16 11:10 AM  Result Value Ref Range   Troponin I 0.18 (HH) <0.03 ng/mL    Comment: CRITICAL VALUE NOTED. VALUE IS CONSISTENT WITH PREVIOUSLY REPORTED/CALLED VALUE ALV  Glucose, capillary     Status: Abnormal   Collection Time: 09/24/16 12:23 PM  Result Value Ref Range   Glucose-Capillary 219 (H) 65 - 99 mg/dL  Glucose, capillary     Status: Abnormal   Collection Time: 09/24/16  4:28 PM  Result Value Ref Range   Glucose-Capillary 206 (H) 65 - 99 mg/dL  Troponin I     Status: Abnormal   Collection Time: 09/24/16  4:36 PM  Result Value Ref Range   Troponin I 0.43 (HH) <0.03 ng/mL    Comment: CRITICAL VALUE NOTED. VALUE IS CONSISTENT WITH PREVIOUSLY REPORTED/CALLED VALUE JJB  MRSA PCR Screening     Status: None   Collection Time: 09/24/16  9:00 PM  Result Value Ref Range   MRSA  by PCR NEGATIVE NEGATIVE    Comment:        The GeneXpert MRSA Assay (FDA approved for NASAL specimens only), is one component of a comprehensive MRSA colonization surveillance program. It is not intended to diagnose MRSA infection nor to guide or monitor treatment for MRSA infections.   Glucose, capillary     Status: Abnormal   Collection Time: 09/24/16 10:11 PM  Result Value Ref Range   Glucose-Capillary 189 (H) 65 - 99 mg/dL  Troponin I     Status: Abnormal   Collection Time: 09/24/16 10:59 PM  Result Value Ref Range   Troponin I 0.77 (HH) <0.03 ng/mL    Comment: CRITICAL VALUE NOTED. VALUE CONSISTENT WITH PREVIOUSLY REPORTED/CALLED VALUE...MSS CORRECTED ON 06/13 AT 0630: PREVIOUSLY REPORTED AS 0.77 CRITICAL RESULT CALLED TO, READ BACK BY AND VERIFIED WITH.MSS   CBC     Status: Abnormal   Collection Time: 09/25/16  4:52 AM  Result Value Ref Range   WBC 14.8 (H) 3.8 - 10.6 K/uL   RBC 3.58 (L) 4.40 - 5.90 MIL/uL   Hemoglobin 11.1 (L) 13.0 - 18.0 g/dL   HCT 34.3 (L) 40.0 - 52.0 %   MCV 95.7 80.0 - 100.0 fL   MCH 31.0 26.0 - 34.0 pg   MCHC 32.3 32.0 - 36.0 g/dL   RDW 15.8 (H) 11.5 - 14.5 %   Platelets 193 150 - 440 K/uL  Comprehensive metabolic panel     Status: Abnormal   Collection Time: 09/25/16  4:52 AM  Result Value Ref Range   Sodium 140 135 - 145 mmol/L   Potassium 5.0 3.5 - 5.1 mmol/L   Chloride 100 (L) 101 - 111 mmol/L   CO2 27 22 - 32 mmol/L   Glucose, Bld 191 (H) 65 - 99 mg/dL   BUN 70 (H) 6 - 20 mg/dL   Creatinine, Ser 6.96 (H) 0.61 - 1.24 mg/dL   Calcium 8.7 (L) 8.9 - 10.3 mg/dL   Total Protein 7.8 6.5 - 8.1 g/dL   Albumin 3.4 (L) 3.5 - 5.0 g/dL   AST 139 (H) 15 - 41 U/L   ALT 156 (H) 17 - 63 U/L   Alkaline Phosphatase 212 (H) 38 - 126 U/L   Total Bilirubin 1.6 (H) 0.3 - 1.2 mg/dL   GFR calc non Af Amer 6 (L) >60 mL/min   GFR calc Af Amer 7 (L) >60 mL/min    Comment: (NOTE) The eGFR has been calculated using the CKD EPI equation. This  calculation  has not been validated in all clinical situations. eGFR's persistently <60 mL/min signify possible Chronic Kidney Disease.    Anion gap 13 5 - 15  Troponin I     Status: Abnormal   Collection Time: 09/25/16  4:52 AM  Result Value Ref Range   Troponin I 1.45 (HH) <0.03 ng/mL    Comment: CRITICAL VALUE NOTED. VALUE IS CONSISTENT WITH PREVIOUSLY REPORTED/CALLED VALUE...[place]  Lipase, blood     Status: Abnormal   Collection Time: 09/25/16  4:52 AM  Result Value Ref Range   Lipase 1,189 (H) 11 - 51 U/L    Comment: RESULT CONFIRMED BY MANUAL DILUTION  Amylase     Status: Abnormal   Collection Time: 09/25/16  4:52 AM  Result Value Ref Range   Amylase 1,645 (H) 28 - 100 U/L  Triglycerides     Status: None   Collection Time: 09/25/16  4:52 AM  Result Value Ref Range   Triglycerides 97 <150 mg/dL  APTT     Status: None   Collection Time: 09/25/16  4:52 AM  Result Value Ref Range   aPTT 35 24 - 36 seconds  Protime-INR     Status: None   Collection Time: 09/25/16  4:52 AM  Result Value Ref Range   Prothrombin Time 14.9 11.4 - 15.2 seconds   INR 1.16   Hepatitis B surface antigen     Status: None   Collection Time: 09/25/16  4:52 AM  Result Value Ref Range   Hepatitis B Surface Ag Negative Negative    Comment: (NOTE) Performed At: [facility] [place], [place] [id number] Lindon Romp MD [id number]   Glucose, capillary     Status: Abnormal   Collection Time: 09/25/16  7:30 AM  Result Value Ref Range   Glucose-Capillary 174 (H) 65 - 99 mg/dL  Troponin I (q 6hr x 3)     Status: Abnormal   Collection Time: 09/25/16  9:59 AM  Result Value Ref Range   Troponin I 1.21 (HH) <0.03 ng/mL    Comment: CRITICAL VALUE NOTED. VALUE IS CONSISTENT WITH PREVIOUSLY REPORTED/CALLED VALUE ALV  Hepatitis B surface antibody     Status: None   Collection Time: 09/25/16  9:59 AM  Result Value Ref Range   Hep B S Ab Non Reactive     Comment: (NOTE)              Non  Reactive: Inconsistent with immunity,                            less than 10 mIU/mL              Reactive:     Consistent with immunity,                            greater than 9.9 mIU/mL Performed At: [facility] [place], [place] [id number] Lindon Romp MD [id number]   Hepatitis B core antibody, total     Status: None   Collection Time: 09/25/16  9:59 AM  Result Value Ref Range   Hep B Core Total Ab Negative Negative    Comment: (NOTE) Performed At: [facility] [address] [place], [place] [id number] Lindon Romp MD [id number]   Magnesium     Status: None   Collection Time: 09/25/16  9:59 AM  Result Value Ref  Range   Magnesium 1.8 1.7 - 2.4 mg/dL  Glucose, capillary     Status: Abnormal   Collection Time: 09/25/16 11:59 AM  Result Value Ref Range   Glucose-Capillary 106 (H) 65 - 99 mg/dL  ECHOCARDIOGRAM COMPLETE     Status: None   Collection Time: 09/25/16  3:23 PM  Result Value Ref Range   Weight 2,846.58 oz   Height 66 in   BP 94/62 mmHg  Glucose, capillary     Status: Abnormal   Collection Time: 09/25/16  4:49 PM  Result Value Ref Range   Glucose-Capillary 184 (H) 65 - 99 mg/dL  Troponin I (q 6hr x 3)     Status: Abnormal   Collection Time: 09/25/16  6:08 PM  Result Value Ref Range   Troponin I 0.99 (HH) <0.03 ng/mL    Comment: CRITICAL VALUE NOTED. VALUE IS CONSISTENT WITH PREVIOUSLY REPORTED/CALLED VALUE.MSS  Heparin level (unfractionated)     Status: None   Collection Time: 09/25/16  6:08 PM  Result Value Ref Range   Heparin Unfractionated 0.42 0.30 - 0.70 IU/mL    Comment:        IF HEPARIN RESULTS ARE BELOW EXPECTED VALUES, AND PATIENT DOSAGE HAS BEEN CONFIRMED, SUGGEST FOLLOW UP TESTING OF ANTITHROMBIN III LEVELS.   Glucose, capillary     Status: Abnormal   Collection Time: 09/25/16  8:50 PM  Result Value Ref Range   Glucose-Capillary 236 (H) 65 - 99 mg/dL   Comment 1 Notify RN    Comment 2 Document  in Chart   Glucose, capillary     Status: Abnormal   Collection Time: 09/25/16 10:06 PM  Result Value Ref Range   Glucose-Capillary 246 (H) 65 - 99 mg/dL   Comment 1 Notify RN    Comment 2 Document in Chart   Troponin I (q 6hr x 3)     Status: Abnormal   Collection Time: 09/25/16 11:23 PM  Result Value Ref Range   Troponin I 0.79 (HH) <0.03 ng/mL    Comment: CRITICAL VALUE NOTED. VALUE IS CONSISTENT WITH PREVIOUSLY REPORTED/CALLED VALUE BY CAF   CBC     Status: Abnormal   Collection Time: 09/26/16  2:07 AM  Result Value Ref Range   WBC 8.9 3.8 - 10.6 K/uL   RBC 2.89 (L) 4.40 - 5.90 MIL/uL   Hemoglobin 8.9 (L) 13.0 - 18.0 g/dL   HCT 27.3 (L) 40.0 - 52.0 %   MCV 94.4 80.0 - 100.0 fL   MCH 30.9 26.0 - 34.0 pg   MCHC 32.7 32.0 - 36.0 g/dL   RDW 15.5 (H) 11.5 - 14.5 %   Platelets 172 150 - 440 K/uL  Lipase, blood     Status: Abnormal   Collection Time: 09/26/16  2:07 AM  Result Value Ref Range   Lipase 1,444 (H) 11 - 51 U/L    Comment: RESULT CONFIRMED BY MANUAL DILUTION. CAF  Amylase     Status: Abnormal   Collection Time: 09/26/16  2:07 AM  Result Value Ref Range   Amylase 1,312 (H) 28 - 100 U/L  Comprehensive metabolic panel     Status: Abnormal   Collection Time: 09/26/16  2:07 AM  Result Value Ref Range   Sodium 138 135 - 145 mmol/L   Potassium 4.0 3.5 - 5.1 mmol/L   Chloride 98 (L) 101 - 111 mmol/L   CO2 31 22 - 32 mmol/L   Glucose, Bld 247 (H) 65 - 99 mg/dL   BUN 44 (  H) 6 - 20 mg/dL   Creatinine, Ser 4.75 (H) 0.61 - 1.24 mg/dL   Calcium 8.2 (L) 8.9 - 10.3 mg/dL   Total Protein 6.6 6.5 - 8.1 g/dL   Albumin 2.9 (L) 3.5 - 5.0 g/dL   AST 79 (H) 15 - 41 U/L   ALT 95 (H) 17 - 63 U/L   Alkaline Phosphatase 185 (H) 38 - 126 U/L   Total Bilirubin 0.8 0.3 - 1.2 mg/dL   GFR calc non Af Amer 10 (L) >60 mL/min   GFR calc Af Amer 12 (L) >60 mL/min    Comment: (NOTE) The eGFR has been calculated using the CKD EPI equation. This calculation has not been validated in all  clinical situations. eGFR's persistently <60 mL/min signify possible Chronic Kidney Disease.    Anion gap 9 5 - 15  Heparin level (unfractionated)     Status: Abnormal   Collection Time: 09/26/16  2:07 AM  Result Value Ref Range   Heparin Unfractionated 0.25 (L) 0.30 - 0.70 IU/mL    Comment:        IF HEPARIN RESULTS ARE BELOW EXPECTED VALUES, AND PATIENT DOSAGE HAS BEEN CONFIRMED, SUGGEST FOLLOW UP TESTING OF ANTITHROMBIN III LEVELS.   Glucose, capillary     Status: Abnormal   Collection Time: 09/26/16  7:53 AM  Result Value Ref Range   Glucose-Capillary 183 (H) 65 - 99 mg/dL  Glucose, capillary     Status: Abnormal   Collection Time: 09/26/16 11:57 AM  Result Value Ref Range   Glucose-Capillary 208 (H) 65 - 99 mg/dL  Glucose, capillary     Status: Abnormal   Collection Time: 09/26/16  4:07 PM  Result Value Ref Range   Glucose-Capillary 156 (H) 65 - 99 mg/dL  Glucose, capillary     Status: Abnormal   Collection Time: 09/26/16  9:24 PM  Result Value Ref Range   Glucose-Capillary 196 (H) 65 - 99 mg/dL   Comment 1 Notify RN    Comment 2 Document in Chart   CBC     Status: Abnormal   Collection Time: 09/27/16  4:50 AM  Result Value Ref Range   WBC 6.9 3.8 - 10.6 K/uL   RBC 2.84 (L) 4.40 - 5.90 MIL/uL   Hemoglobin 9.0 (L) 13.0 - 18.0 g/dL   HCT 26.7 (L) 40.0 - 52.0 %   MCV 94.0 80.0 - 100.0 fL   MCH 31.7 26.0 - 34.0 pg   MCHC 33.7 32.0 - 36.0 g/dL   RDW 15.7 (H) 11.5 - 14.5 %   Platelets 166 150 - 440 K/uL  Basic metabolic panel     Status: Abnormal   Collection Time: 09/27/16  4:50 AM  Result Value Ref Range   Sodium 139 135 - 145 mmol/L   Potassium 4.2 3.5 - 5.1 mmol/L   Chloride 98 (L) 101 - 111 mmol/L   CO2 30 22 - 32 mmol/L   Glucose, Bld 189 (H) 65 - 99 mg/dL   BUN 61 (H) 6 - 20 mg/dL   Creatinine, Ser 6.54 (H) 0.61 - 1.24 mg/dL   Calcium 8.3 (L) 8.9 - 10.3 mg/dL   GFR calc non Af Amer 7 (L) >60 mL/min   GFR calc Af Amer 8 (L) >60 mL/min    Comment:  (NOTE) The eGFR has been calculated using the CKD EPI equation. This calculation has not been validated in all clinical situations. eGFR's persistently <60 mL/min signify possible Chronic Kidney Disease.    Anion gap  11 5 - 15  Glucose, capillary     Status: Abnormal   Collection Time: 09/27/16  7:28 AM  Result Value Ref Range   Glucose-Capillary 152 (H) 65 - 99 mg/dL  Glucose, capillary     Status: Abnormal   Collection Time: 09/27/16  4:21 PM  Result Value Ref Range   Glucose-Capillary 165 (H) 65 - 99 mg/dL  Glucose, capillary     Status: Abnormal   Collection Time: 09/27/16  9:23 PM  Result Value Ref Range   Glucose-Capillary 238 (H) 65 - 99 mg/dL   Comment 1 Notify RN    Comment 2 Document in Chart   Glucose, capillary     Status: Abnormal   Collection Time: 09/28/16  7:40 AM  Result Value Ref Range   Glucose-Capillary 127 (H) 65 - 99 mg/dL   Comment 1 Notify RN   Comprehensive metabolic panel     Status: Abnormal   Collection Time: 09/28/16  9:37 AM  Result Value Ref Range   Sodium 139 135 - 145 mmol/L   Potassium 3.8 3.5 - 5.1 mmol/L   Chloride 96 (L) 101 - 111 mmol/L   CO2 29 22 - 32 mmol/L   Glucose, Bld 192 (H) 65 - 99 mg/dL   BUN 40 (H) 6 - 20 mg/dL   Creatinine, Ser 4.80 (H) 0.61 - 1.24 mg/dL   Calcium 8.5 (L) 8.9 - 10.3 mg/dL   Total Protein 7.5 6.5 - 8.1 g/dL   Albumin 3.2 (L) 3.5 - 5.0 g/dL   AST 50 (H) 15 - 41 U/L   ALT 54 17 - 63 U/L   Alkaline Phosphatase 192 (H) 38 - 126 U/L   Total Bilirubin 0.9 0.3 - 1.2 mg/dL   GFR calc non Af Amer 10 (L) >60 mL/min   GFR calc Af Amer 12 (L) >60 mL/min    Comment: (NOTE) The eGFR has been calculated using the CKD EPI equation. This calculation has not been validated in all clinical situations. eGFR's persistently <60 mL/min signify possible Chronic Kidney Disease.    Anion gap 14 5 - 15  CBC     Status: Abnormal   Collection Time: 09/28/16  9:37 AM  Result Value Ref Range   WBC 8.1 3.8 - 10.6 K/uL   RBC  3.30 (L) 4.40 - 5.90 MIL/uL   Hemoglobin 10.0 (L) 13.0 - 18.0 g/dL   HCT 30.3 (L) 40.0 - 52.0 %   MCV 92.0 80.0 - 100.0 fL   MCH 30.3 26.0 - 34.0 pg   MCHC 32.9 32.0 - 36.0 g/dL   RDW 15.3 (H) 11.5 - 14.5 %   Platelets 211 150 - 440 K/uL  Lipase, blood     Status: Abnormal   Collection Time: 09/28/16  9:37 AM  Result Value Ref Range   Lipase 577 (H) 11 - 51 U/L    Comment: RESULT CONFIRMED BY MANUAL DILUTION. TCH.  Glucose, capillary     Status: Abnormal   Collection Time: 09/28/16 11:31 AM  Result Value Ref Range   Glucose-Capillary 246 (H) 65 - 99 mg/dL   Comment 1 Notify RN   Glucose, capillary     Status: Abnormal   Collection Time: 09/28/16  4:41 PM  Result Value Ref Range   Glucose-Capillary 116 (H) 65 - 99 mg/dL   Comment 1 Notify RN   Glucose, capillary     Status: Abnormal   Collection Time: 09/28/16  8:59 PM  Result Value Ref Range  Glucose-Capillary 253 (H) 65 - 99 mg/dL   Comment 1 Notify RN    Comment 2 Document in Chart   Comprehensive metabolic panel     Status: Abnormal   Collection Time: 09/29/16  5:35 AM  Result Value Ref Range   Sodium 139 135 - 145 mmol/L   Potassium 4.1 3.5 - 5.1 mmol/L   Chloride 98 (L) 101 - 111 mmol/L   CO2 31 22 - 32 mmol/L   Glucose, Bld 178 (H) 65 - 99 mg/dL   BUN 53 (H) 6 - 20 mg/dL   Creatinine, Ser 5.91 (H) 0.61 - 1.24 mg/dL   Calcium 8.3 (L) 8.9 - 10.3 mg/dL   Total Protein 6.9 6.5 - 8.1 g/dL   Albumin 2.8 (L) 3.5 - 5.0 g/dL   AST 38 15 - 41 U/L   ALT 29 17 - 63 U/L   Alkaline Phosphatase 178 (H) 38 - 126 U/L   Total Bilirubin 0.6 0.3 - 1.2 mg/dL   GFR calc non Af Amer 8 (L) >60 mL/min   GFR calc Af Amer 9 (L) >60 mL/min    Comment: (NOTE) The eGFR has been calculated using the CKD EPI equation. This calculation has not been validated in all clinical situations. eGFR's persistently <60 mL/min signify possible Chronic Kidney Disease.    Anion gap 10 5 - 15  Lipase, blood     Status: Abnormal   Collection Time:  09/29/16  5:35 AM  Result Value Ref Range   Lipase 559 (H) 11 - 51 U/L    Comment: RESULT CONFIRMED BY MANUAL DILUTION/HKP  Glucose, capillary     Status: Abnormal   Collection Time: 09/29/16  7:51 AM  Result Value Ref Range   Glucose-Capillary 230 (H) 65 - 99 mg/dL  Glucose, capillary     Status: Abnormal   Collection Time: 09/29/16 11:40 AM  Result Value Ref Range   Glucose-Capillary 270 (H) 65 - 99 mg/dL    Assessment/Plan:  IDDM (insulin dependent diabetes mellitus) (HCC) blood glucose control important in reducing the progression of atherosclerotic disease. Also, involved in wound healing. On appropriate medications.   ESRD on hemodialysis [facility]) This is an independent risk factor for poor wound healing and also increases likelihood of significant peripheral arterial disease. Should continue his dialysis schedule.  Dementia Difficult situation as he may not be able to avoid pressure and other issues.  Atherosclerosis of native arteries of the extremities with gangrene [facility]) This represents a critical limb threatening situation. I had a long discussion today with the patient and his wife. I am highly concerned about his toe and overall and concern for major limb loss. With diabetes, renal failure, dementia is at very high risk for major amputation. I have recommended we go ahead and proceed with angiography. He is revascularization procedures although these have been years ago. Pending the findings of angiogram, revascularization is reasonable. Open surgical therapy could be considered, but he would not be a great candidate given his age and comorbidities. This will be scheduled for the very near future at his convenience. Risks and benefits are discussed. He and his wife understand this is clearly a critical and limb threatening situation.      Leotis Pain 11/05/2016, 9:50 AM   This note was created with Dragon medical transcription system.  Any errors from dictation are  unintentional.

## 2016-11-05 NOTE — Assessment & Plan Note (Signed)
blood glucose control important in reducing the progression of atherosclerotic disease. Also, involved in wound healing. On appropriate medications.  

## 2016-11-05 NOTE — Assessment & Plan Note (Signed)
This is an independent risk factor for poor wound healing and also increases likelihood of significant peripheral arterial disease. Should continue his dialysis schedule.

## 2016-11-05 NOTE — Assessment & Plan Note (Signed)
Difficult situation as he may not be able to avoid pressure and other issues.

## 2016-11-06 MED ORDER — CLINDAMYCIN PHOSPHATE 300 MG/50ML IV SOLN: 300.0000 mg | Freq: Once | INTRAVENOUS | Status: DC

## 2016-11-07 ENCOUNTER — Ambulatory Visit
Admission: RE | Admit: 2016-11-07 | Discharge: 2016-11-07 | Disposition: A | Discharge status: Home / Self Care | Payer: Medicare Other | Source: Ambulatory Visit | Attending: Vascular Surgery | Admitting: Vascular Surgery

## 2016-11-07 ENCOUNTER — Encounter
Admission: RE | Disposition: A | Discharge status: Home / Self Care | Payer: Self-pay | Source: Ambulatory Visit | Attending: Vascular Surgery

## 2016-11-07 DIAGNOSIS — Z9889 Other specified postprocedural states: Secondary | ICD-10-CM | POA: Diagnosis not present

## 2016-11-07 DIAGNOSIS — Z886 Allergy status to analgesic agent status: Secondary | ICD-10-CM | POA: Diagnosis not present

## 2016-11-07 DIAGNOSIS — Z89429 Acquired absence of other toe(s), unspecified side: Secondary | ICD-10-CM | POA: Diagnosis not present

## 2016-11-07 DIAGNOSIS — R569 Unspecified convulsions: Secondary | ICD-10-CM | POA: Diagnosis not present

## 2016-11-07 DIAGNOSIS — E1140 Type 2 diabetes mellitus with diabetic neuropathy, unspecified: Secondary | ICD-10-CM | POA: Diagnosis not present

## 2016-11-07 DIAGNOSIS — Z880 Allergy status to penicillin: Secondary | ICD-10-CM | POA: Diagnosis not present

## 2016-11-07 DIAGNOSIS — I70261 Atherosclerosis of native arteries of extremities with gangrene, right leg: Secondary | ICD-10-CM | POA: Insufficient documentation

## 2016-11-07 DIAGNOSIS — I120 Hypertensive chronic kidney disease with stage 5 chronic kidney disease or end stage renal disease: Secondary | ICD-10-CM | POA: Insufficient documentation

## 2016-11-07 DIAGNOSIS — Z96642 Presence of left artificial hip joint: Secondary | ICD-10-CM | POA: Diagnosis not present

## 2016-11-07 DIAGNOSIS — Z9181 History of falling: Secondary | ICD-10-CM | POA: Insufficient documentation

## 2016-11-07 DIAGNOSIS — Z794 Long term (current) use of insulin: Secondary | ICD-10-CM | POA: Insufficient documentation

## 2016-11-07 DIAGNOSIS — F0390 Unspecified dementia without behavioral disturbance: Secondary | ICD-10-CM | POA: Diagnosis not present

## 2016-11-07 DIAGNOSIS — Z992 Dependence on renal dialysis: Secondary | ICD-10-CM | POA: Insufficient documentation

## 2016-11-07 DIAGNOSIS — N186 End stage renal disease: Secondary | ICD-10-CM | POA: Diagnosis not present

## 2016-11-07 DIAGNOSIS — Z8673 Personal history of transient ischemic attack (TIA), and cerebral infarction without residual deficits: Secondary | ICD-10-CM | POA: Insufficient documentation

## 2016-11-07 DIAGNOSIS — I739 Peripheral vascular disease, unspecified: Secondary | ICD-10-CM

## 2016-11-07 DIAGNOSIS — E1122 Type 2 diabetes mellitus with diabetic chronic kidney disease: Secondary | ICD-10-CM | POA: Diagnosis not present

## 2016-11-07 DIAGNOSIS — K219 Gastro-esophageal reflux disease without esophagitis: Secondary | ICD-10-CM | POA: Diagnosis not present

## 2016-11-07 DIAGNOSIS — Z8546 Personal history of malignant neoplasm of prostate: Secondary | ICD-10-CM | POA: Insufficient documentation

## 2016-11-07 DIAGNOSIS — D631 Anemia in chronic kidney disease: Secondary | ICD-10-CM | POA: Diagnosis not present

## 2016-11-07 HISTORY — PX: LOWER EXTREMITY ANGIOGRAPHY: CATH118251

## 2016-11-07 LAB — GLUCOSE, CAPILLARY: GLUCOSE-CAPILLARY: 140 mg/dL — AB (ref 65–99)

## 2016-11-07 LAB — POTASSIUM (ARMC VASCULAR LAB ONLY): POTASSIUM (ARMC VASCULAR LAB): 4.1 (ref 3.5–5.1)

## 2016-11-07 SURGERY — LOWER EXTREMITY ANGIOGRAPHY
Anesthesia: Moderate Sedation | Laterality: Right

## 2016-11-07 MED ORDER — HEPARIN SODIUM (PORCINE) 1000 UNIT/ML IJ SOLN
INTRAMUSCULAR | Status: DC | PRN
  Administered 2016-11-07: 4000 [IU] via INTRAVENOUS

## 2016-11-07 MED ORDER — SODIUM CHLORIDE 0.9 % IV SOLN
INTRAVENOUS | Status: DC
  Administered 2016-11-07: 12:00:00 via INTRAVENOUS

## 2016-11-07 MED ORDER — FENTANYL CITRATE (PF) 100 MCG/2ML IJ SOLN
INTRAMUSCULAR | Status: AC
  Filled 2016-11-07: qty 2

## 2016-11-07 MED ORDER — IOPAMIDOL (ISOVUE-300) INJECTION 61%
INTRAVENOUS | Status: DC | PRN
  Administered 2016-11-07: 50 mL via INTRAVENOUS

## 2016-11-07 MED ORDER — MIDAZOLAM HCL 2 MG/2ML IJ SOLN
INTRAMUSCULAR | Status: DC | PRN
  Administered 2016-11-07: 2 mg via INTRAVENOUS
  Administered 2016-11-07 (×3): 1 mg via INTRAVENOUS

## 2016-11-07 MED ORDER — MIDAZOLAM HCL 5 MG/5ML IJ SOLN
INTRAMUSCULAR | Status: AC
  Filled 2016-11-07: qty 5

## 2016-11-07 MED ORDER — HEPARIN SODIUM (PORCINE) 1000 UNIT/ML IJ SOLN
INTRAMUSCULAR | Status: AC
  Filled 2016-11-07: qty 1

## 2016-11-07 MED ORDER — FENTANYL CITRATE (PF) 100 MCG/2ML IJ SOLN
INTRAMUSCULAR | Status: DC | PRN
  Administered 2016-11-07 (×3): 25 ug via INTRAVENOUS
  Administered 2016-11-07: 50 ug via INTRAVENOUS

## 2016-11-07 MED ORDER — CLINDAMYCIN PHOSPHATE 300 MG/50ML IV SOLN
INTRAVENOUS | Status: AC
  Filled 2016-11-07: qty 50

## 2016-11-07 MED ORDER — CEFAZOLIN SODIUM-DEXTROSE 1-4 GM/50ML-% IV SOLN
INTRAVENOUS | Status: AC
  Filled 2016-11-07: qty 50

## 2016-11-07 SURGICAL SUPPLY — 22 items
BALLN DORADO 5X200X135 (BALLOONS) ×3
BALLN LUTONIX 5X150X130 (BALLOONS) ×3
BALLN LUTONIX 6X150X130 (BALLOONS) ×3
BALLN ULTRVRSE 2X100X150 (BALLOONS) ×3
BALLOON DORADO 5X200X135 (BALLOONS) ×1 IMPLANT
BALLOON LUTONIX 5X150X130 (BALLOONS) ×1 IMPLANT
BALLOON LUTONIX 6X150X130 (BALLOONS) ×1 IMPLANT
BALLOON ULTRVRSE 2X100X150 (BALLOONS) ×1 IMPLANT
CATH BEACON 5 .038 100 VERT TP (CATHETERS) ×3 IMPLANT
CATH CXI SUPP ANG 4FR 135 (MICROCATHETER) ×1 IMPLANT
CATH CXI SUPP ANG 4FR 135CM (MICROCATHETER) ×3
CATH PIG 70CM (CATHETERS) ×3 IMPLANT
DEVICE PRESTO INFLATION (MISCELLANEOUS) ×3 IMPLANT
DEVICE STARCLOSE SE CLOSURE (Vascular Products) ×3 IMPLANT
GLIDEWIRE ADV .035X260CM (WIRE) ×3 IMPLANT
PACK ANGIOGRAPHY (CUSTOM PROCEDURE TRAY) ×3 IMPLANT
SHEATH ANL2 6FRX45 HC (SHEATH) ×3 IMPLANT
SHEATH BRITE TIP 5FRX11 (SHEATH) ×3 IMPLANT
SYR MEDRAD MARK V 150ML (SYRINGE) ×3 IMPLANT
TUBING CONTRAST HIGH PRESS 72 (TUBING) ×3 IMPLANT
WIRE G V18X300CM (WIRE) ×3 IMPLANT
WIRE J 3MM .035X145CM (WIRE) ×3 IMPLANT

## 2016-11-07 NOTE — H&P (Signed)
[place] [facility] History & Physical Update  The patient was interviewed and re-examined.  The patient's previous History and Physical has been reviewed and is unchanged.  There is no change in the plan of care. We plan to proceed with the scheduled procedure.  Leotis Pain, MD  11/07/2016, 12:52 PM

## 2016-11-07 NOTE — Op Note (Signed)
[place] [facility] Percutaneous Study/Intervention Procedural Note   Date of Surgery: 11/07/2016  Surgeon(s):Bodhi Stenglein   Assistants:none  Pre-operative Diagnosis: PAD with gangrene right lower extremity  Post-operative diagnosis: Same  Procedure(s) Performed: 1. Ultrasound guidance for vascular access left femoral artery 2. Catheter placement into right posterior tibial artery from left femoral approach 3. Aortogram and selective right lower extremity angiogram 4. Percutaneous transluminal angioplasty of right posterior tibial artery below the bypass with 2 mm diameter by 10 cm length angioplasty balloon 5. Percutaneous transluminal angioplasty of the right above-knee popliteal artery and distal SFA with 5 mm diameter by 15 cm length Lutonix drug-coated angioplasty balloon and 5 mm diameter high pressure angioplasty balloon  6.  Percutaneous transluminal angioplasty of the right mid to distal SFA with 6 mm diameter by 15 cm length Lutonix drug-coated angioplasty balloon 7. StarClose closure device left femoral artery  EBL: 10 cc  Contrast: 50 cc  Fluoro Time: 5.3 minutes  Moderate Conscious Sedation Time: approximately 40 minutes using 5 mg of Versed and 125 mcg of Fentanyl  Indications: Patient is a 83 y.o.male with a nonhealing ulceration on the right second toe after it was debrided for gangrene. He has an extensive history of peripheral vascular disease. The patient is brought in for angiography for further evaluation and potential treatment. Risks and benefits are discussed and informed consent is obtained  Procedure: The patient was identified and appropriate procedural time out was performed. The patient was then placed supine on the table and prepped and draped in the usual sterile fashion.Moderate conscious sedation was administered during a face to face  encounter with the patient throughout the procedure with my supervision of the RN administering medicines and monitoring the patient's vital signs, pulse oximetry, telemetry and mental status throughout from the start of the procedure until the patient was taken to the recovery room. Ultrasound was used to evaluate the left common femoral artery. It was patent . A digital ultrasound image was acquired. A Seldinger needle was used to access the left common femoral artery under direct ultrasound guidance and a permanent image was performed. A 0.035 J wire was advanced without resistance and a 5Fr sheath was placed. Pigtail catheter was placed into the aorta and an AP aortogram was performed. This demonstrated normal renal arteries and normal aorta and iliac segments without significant stenosis. I then crossed the aortic bifurcation and advanced to the right femoral head. Selective right lower extremity angiogram was then performed. This demonstrated densely calcific disease of the right common femoral artery and profunda femoris artery without significant stenosis. The right mid SFA had about a 60% stenosis. The vessel then normalized and at Hunter's canal there was about a 70-80% stenosis. The below-knee popliteal artery was then patent and there was a bypass originating from the below-knee popliteal artery which terminated in a small, diseased posterior tibial artery that had an occlusion about 8-10 cm beyond the distal bypass anastomosis. The occlusion was very short segment but the vessel was quite small. The patient was systemically heparinized and a 6 [place] [employer] sheath was then placed over the [employer] wire. I then used a Kumpe catheter and the advantage wire to navigate across the SFA and popliteal stenosis and down into the bypass from the popliteal artery to the posterior tibial artery. I then exchanged for a 0.018 wire and CXI catheter to get down into the posterior tibial artery beyond the  bypass anastomosis. I confirmed intraluminal flow below the stenosis and then  replaced the 0.018 wire and proceeded with treatment. A 2 mm diameter by 10 cm length angioplasty balloon was then inflated the posterior tibial artery from the distal bypass anastomosis to just above the ankle. This was inflated up to about 10 atm for 1 minute. Completion angiogram which was difficult due to continuous patient motion showed resolution of the previous occlusion with only about a 20% residual stenosis. I then turned my attention to the SFA and popliteal lesions. A 5 mm diameter by 15 cm length Lutonix drug-coated angioplasty balloon was inflated in the above-knee popliteal artery and distal SFA but it ruptured at nominal pressures. I then used a 5 mm diameter by 20 cm length high pressure angioplasty balloon and treated from the proximal popliteal artery up to the mid SFA inflating to 12 atm for 1 minute. The mid SFA lesion was treated with a 6 mm diameter by 15 cm length Lutonix drug-coated angioplasty balloon inflated to 10 atm for 1 minute. Completion angiogram showed about a 20% residual stenosis in the mid SFA lesion and about a 30-40% residual stenosis in the proximal popliteal stenosis at Hunter's canal, neither of which was flow-limiting. I elected to terminate the procedure. The sheath was removed and StarClose closure device was deployed in the left femoral artery with excellent hemostatic result. The patient was taken to the recovery room in stable condition having tolerated the procedure well.  Findings:  Aortogram: Normal aorta and iliac arteries without significant stenosis. No obvious renal artery stenosis identified Right Lower Extremity: Densely calcific disease of the right common femoral artery and profunda femoris artery without significant stenosis. The right mid SFA had about a 60% stenosis. The vessel then normalized and at Hunter's canal there was about a 70-80%  stenosis. The below-knee popliteal artery was then patent and there was a bypass originating from the below-knee popliteal artery which terminated in a small, diseased posterior tibial artery that had an occlusion about 8-10 cm beyond the distal bypass anastomosis. The occlusion was very short segment but the vessel was quite small.   Disposition: Patient was taken to the recovery room in stable condition having tolerated the procedure well.  Complications: None  Michael Acosta 11/07/2016 3:06 PM   This note was created with Dragon Medical transcription system. Any errors in dictation are purely unintentional.

## 2016-11-07 NOTE — Discharge Instructions (Signed)

## 2016-11-07 NOTE — H&P (Signed)
[place] [facility] History & Physical Update  The patient was interviewed and re-examined.  The patient's previous History and Physical has been reviewed and is unchanged.  There is no change in the plan of care. We plan to proceed with the scheduled procedure.  Leotis Pain, MD  11/07/2016, 11:16 AM

## 2016-11-08 ENCOUNTER — Encounter: Payer: Self-pay | Admitting: Vascular Surgery

## 2016-11-15 ENCOUNTER — Inpatient Hospital Stay: Payer: Medicare Other

## 2016-11-15 ENCOUNTER — Emergency Department: Payer: Medicare Other

## 2016-11-15 ENCOUNTER — Inpatient Hospital Stay
Admission: EM | Admit: 2016-11-15 | Discharge: 2016-11-19 | DRG: 314 | Disposition: A | Discharge status: Skilled Nursing Facility | Payer: Medicare Other | Attending: Internal Medicine | Admitting: Internal Medicine

## 2016-11-15 ENCOUNTER — Encounter: Payer: Self-pay | Admitting: Emergency Medicine

## 2016-11-15 DIAGNOSIS — K921 Melena: Secondary | ICD-10-CM

## 2016-11-15 DIAGNOSIS — E11649 Type 2 diabetes mellitus with hypoglycemia without coma: Secondary | ICD-10-CM | POA: Diagnosis present

## 2016-11-15 DIAGNOSIS — Z89429 Acquired absence of other toe(s), unspecified side: Secondary | ICD-10-CM | POA: Diagnosis not present

## 2016-11-15 DIAGNOSIS — I96 Gangrene, not elsewhere classified: Secondary | ICD-10-CM | POA: Diagnosis not present

## 2016-11-15 DIAGNOSIS — E860 Dehydration: Secondary | ICD-10-CM | POA: Diagnosis present

## 2016-11-15 DIAGNOSIS — A419 Sepsis, unspecified organism: Secondary | ICD-10-CM | POA: Diagnosis present

## 2016-11-15 DIAGNOSIS — E1142 Type 2 diabetes mellitus with diabetic polyneuropathy: Secondary | ICD-10-CM | POA: Diagnosis present

## 2016-11-15 DIAGNOSIS — D631 Anemia in chronic kidney disease: Secondary | ICD-10-CM | POA: Diagnosis present

## 2016-11-15 DIAGNOSIS — Z66 Do not resuscitate: Secondary | ICD-10-CM | POA: Diagnosis present

## 2016-11-15 DIAGNOSIS — Z794 Long term (current) use of insulin: Secondary | ICD-10-CM | POA: Diagnosis not present

## 2016-11-15 DIAGNOSIS — E1152 Type 2 diabetes mellitus with diabetic peripheral angiopathy with gangrene: Secondary | ICD-10-CM | POA: Diagnosis present

## 2016-11-15 DIAGNOSIS — I120 Hypertensive chronic kidney disease with stage 5 chronic kidney disease or end stage renal disease: Secondary | ICD-10-CM | POA: Diagnosis present

## 2016-11-15 DIAGNOSIS — G9341 Metabolic encephalopathy: Secondary | ICD-10-CM | POA: Diagnosis present

## 2016-11-15 DIAGNOSIS — R680 Hypothermia, not associated with low environmental temperature: Secondary | ICD-10-CM | POA: Diagnosis present

## 2016-11-15 DIAGNOSIS — R400 Somnolence: Secondary | ICD-10-CM

## 2016-11-15 DIAGNOSIS — G40909 Epilepsy, unspecified, not intractable, without status epilepticus: Secondary | ICD-10-CM | POA: Diagnosis present

## 2016-11-15 DIAGNOSIS — N186 End stage renal disease: Secondary | ICD-10-CM | POA: Diagnosis present

## 2016-11-15 DIAGNOSIS — K219 Gastro-esophageal reflux disease without esophagitis: Secondary | ICD-10-CM | POA: Diagnosis present

## 2016-11-15 DIAGNOSIS — I959 Hypotension, unspecified: Secondary | ICD-10-CM | POA: Diagnosis present

## 2016-11-15 DIAGNOSIS — F0150 Vascular dementia without behavioral disturbance: Secondary | ICD-10-CM | POA: Diagnosis present

## 2016-11-15 DIAGNOSIS — Z992 Dependence on renal dialysis: Secondary | ICD-10-CM

## 2016-11-15 DIAGNOSIS — F0390 Unspecified dementia without behavioral disturbance: Secondary | ICD-10-CM | POA: Diagnosis not present

## 2016-11-15 DIAGNOSIS — Z96642 Presence of left artificial hip joint: Secondary | ICD-10-CM | POA: Diagnosis present

## 2016-11-15 DIAGNOSIS — Z8673 Personal history of transient ischemic attack (TIA), and cerebral infarction without residual deficits: Secondary | ICD-10-CM

## 2016-11-15 DIAGNOSIS — E785 Hyperlipidemia, unspecified: Secondary | ICD-10-CM | POA: Diagnosis present

## 2016-11-15 DIAGNOSIS — E1122 Type 2 diabetes mellitus with diabetic chronic kidney disease: Secondary | ICD-10-CM | POA: Diagnosis present

## 2016-11-15 DIAGNOSIS — G9340 Encephalopathy, unspecified: Secondary | ICD-10-CM

## 2016-11-15 DIAGNOSIS — Z8546 Personal history of malignant neoplasm of prostate: Secondary | ICD-10-CM | POA: Diagnosis not present

## 2016-11-15 LAB — CBC WITH DIFFERENTIAL/PLATELET
BASOS PCT: 0 %
Basophils Absolute: 0 10*3/uL (ref 0–0.1)
EOS ABS: 0.1 10*3/uL (ref 0–0.7)
Eosinophils Relative: 1 %
HEMATOCRIT: 30.6 % — AB (ref 40.0–52.0)
HEMOGLOBIN: 10.2 g/dL — AB (ref 13.0–18.0)
Lymphocytes Relative: 10 %
Lymphs Abs: 0.7 10*3/uL — ABNORMAL LOW (ref 1.0–3.6)
MCH: 31.6 pg (ref 26.0–34.0)
MCHC: 33.2 g/dL (ref 32.0–36.0)
MCV: 95.2 fL (ref 80.0–100.0)
MONOS PCT: 11 %
Monocytes Absolute: 0.7 10*3/uL (ref 0.2–1.0)
NEUTROS ABS: 5.1 10*3/uL (ref 1.4–6.5)
NEUTROS PCT: 78 %
Platelets: 194 10*3/uL (ref 150–440)
RBC: 3.22 MIL/uL — AB (ref 4.40–5.90)
RDW: 15 % — ABNORMAL HIGH (ref 11.5–14.5)
WBC: 6.5 10*3/uL (ref 3.8–10.6)

## 2016-11-15 LAB — GLUCOSE, CAPILLARY
Glucose-Capillary: 109 mg/dL — ABNORMAL HIGH (ref 65–99)
Glucose-Capillary: 87 mg/dL (ref 65–99)

## 2016-11-15 LAB — TROPONIN I: Troponin I: 0.06 ng/mL (ref <0.03)

## 2016-11-15 LAB — COMPREHENSIVE METABOLIC PANEL
ALBUMIN: 3.5 g/dL (ref 3.5–5.0)
ALK PHOS: 98 U/L (ref 38–126)
ALT: 13 U/L — AB (ref 17–63)
AST: 23 U/L (ref 15–41)
Anion gap: 14 (ref 5–15)
BILIRUBIN TOTAL: 0.5 mg/dL (ref 0.3–1.2)
BUN: 58 mg/dL — AB (ref 6–20)
CALCIUM: 8.9 mg/dL (ref 8.9–10.3)
CO2: 25 mmol/L (ref 22–32)
CREATININE: 7.09 mg/dL — AB (ref 0.61–1.24)
Chloride: 100 mmol/L — ABNORMAL LOW (ref 101–111)
GFR calc Af Amer: 7 mL/min — ABNORMAL LOW (ref >60)
GFR calc non Af Amer: 6 mL/min — ABNORMAL LOW (ref >60)
GLUCOSE: 94 mg/dL (ref 65–99)
Potassium: 3.8 mmol/L (ref 3.5–5.1)
SODIUM: 139 mmol/L (ref 135–145)
TOTAL PROTEIN: 7 g/dL (ref 6.5–8.1)

## 2016-11-15 LAB — MRSA PCR SCREENING: MRSA by PCR: NEGATIVE

## 2016-11-15 LAB — LACTIC ACID, PLASMA: Lactic Acid, Venous: 0.5 mmol/L (ref 0.5–1.9)

## 2016-11-15 LAB — PROCALCITONIN: Procalcitonin: 0.2 ng/mL

## 2016-11-15 MED ORDER — VANCOMYCIN HCL IN DEXTROSE 1-5 GM/200ML-% IV SOLN
1000.0000 mg | Freq: Once | INTRAVENOUS | Status: DC
  Filled 2016-11-15: qty 200

## 2016-11-15 MED ORDER — HEPARIN SODIUM (PORCINE) 5000 UNIT/ML IJ SOLN
5000.0000 [IU] | Freq: Three times a day (TID) | INTRAMUSCULAR | Status: DC
  Administered 2016-11-15 – 2016-11-17 (×8): 5000 [IU] via SUBCUTANEOUS
  Filled 2016-11-15 (×8): qty 1

## 2016-11-15 MED ORDER — DEXTROSE 5 % IV SOLN
1.0000 g | Freq: Once | INTRAVENOUS | Status: AC
  Administered 2016-11-15: 1 g via INTRAVENOUS
  Filled 2016-11-15: qty 1

## 2016-11-15 MED ORDER — INSULIN ASPART 100 UNIT/ML ~~LOC~~ SOLN
0.0000 [IU] | SUBCUTANEOUS | Status: DC
Start: 2016-11-15 — End: 2016-11-16

## 2016-11-15 MED ORDER — CEFEPIME HCL 1 G IJ SOLR
1.0000 g | INTRAMUSCULAR | Status: DC
  Administered 2016-11-16 – 2016-11-17 (×2): 1 g via INTRAVENOUS
  Filled 2016-11-15 (×3): qty 1

## 2016-11-15 MED ORDER — SODIUM CHLORIDE 0.9 % IV SOLN
1000.0000 mg | Freq: Once | INTRAVENOUS | Status: AC
  Administered 2016-11-15: 1000 mg via INTRAVENOUS
  Filled 2016-11-15: qty 10

## 2016-11-15 MED ORDER — ONDANSETRON HCL 4 MG PO TABS: 4.0000 mg | ORAL_TABLET | Freq: Four times a day (QID) | ORAL | Status: DC | PRN

## 2016-11-15 MED ORDER — ENOXAPARIN SODIUM 30 MG/0.3ML ~~LOC~~ SOLN: 30.0000 mg | SUBCUTANEOUS | Status: DC

## 2016-11-15 MED ORDER — PHENYLEPHRINE HCL 10 MG/ML IJ SOLN
0.0000 ug/min | INTRAMUSCULAR | Status: DC
  Administered 2016-11-15: 40 ug/min via INTRAVENOUS
  Administered 2016-11-15: 100 ug/min via INTRAVENOUS
  Filled 2016-11-15 (×3): qty 1

## 2016-11-15 MED ORDER — VANCOMYCIN HCL 10 G IV SOLR
1500.0000 mg | Freq: Once | INTRAVENOUS | Status: AC
  Administered 2016-11-15: 1500 mg via INTRAVENOUS
  Filled 2016-11-15: qty 1500

## 2016-11-15 MED ORDER — SODIUM CHLORIDE 0.9 % IV SOLN
0.0000 ug/min | INTRAVENOUS | Status: DC
  Administered 2016-11-15: 20 ug/min via INTRAVENOUS
  Filled 2016-11-15 (×2): qty 1

## 2016-11-15 MED ORDER — ORAL CARE MOUTH RINSE
15.0000 mL | Freq: Two times a day (BID) | OROMUCOSAL | Status: DC
  Administered 2016-11-15 – 2016-11-17 (×2): 15 mL via OROMUCOSAL

## 2016-11-15 MED ORDER — ONDANSETRON HCL 4 MG/2ML IJ SOLN: 4.0000 mg | Freq: Four times a day (QID) | INTRAMUSCULAR | Status: DC | PRN

## 2016-11-15 MED ORDER — SODIUM CHLORIDE 0.9 % IV SOLN
500.0000 mg | INTRAVENOUS | Status: DC
  Administered 2016-11-15: 500 mg via INTRAVENOUS
  Filled 2016-11-15 (×2): qty 5

## 2016-11-15 MED ORDER — INSULIN ASPART 100 UNIT/ML ~~LOC~~ SOLN: 0.0000 [IU] | Freq: Three times a day (TID) | SUBCUTANEOUS | Status: DC

## 2016-11-15 MED ORDER — SODIUM CHLORIDE 0.9 % IV BOLUS (SEPSIS)
500.0000 mL | Freq: Once | INTRAVENOUS | Status: AC
  Administered 2016-11-15: 500 mL via INTRAVENOUS

## 2016-11-15 MED ORDER — SODIUM CHLORIDE 0.9 % IV SOLN
0.0000 ug/min | INTRAVENOUS | Status: DC
  Filled 2016-11-15: qty 4

## 2016-11-15 MED ORDER — CHLORHEXIDINE GLUCONATE 0.12 % MT SOLN
15.0000 mL | Freq: Two times a day (BID) | OROMUCOSAL | Status: DC
  Administered 2016-11-16 – 2016-11-17 (×2): 15 mL via OROMUCOSAL
  Filled 2016-11-15 (×2): qty 15

## 2016-11-15 MED ORDER — VANCOMYCIN HCL IN DEXTROSE 750-5 MG/150ML-% IV SOLN
750.0000 mg | INTRAVENOUS | Status: DC | PRN
  Filled 2016-11-15: qty 150

## 2016-11-15 MED ORDER — INSULIN ASPART 100 UNIT/ML ~~LOC~~ SOLN: 0.0000 [IU] | Freq: Every day | SUBCUTANEOUS | Status: DC

## 2016-11-15 MED ORDER — SODIUM CHLORIDE 0.9 % IV SOLN
INTRAVENOUS | Status: DC
  Administered 2016-11-15: 13:00:00 via INTRAVENOUS

## 2016-11-15 NOTE — ED Notes (Signed)
Collyn, RN attempted IV access unsuccessfully. EJ attempt unsuccessful by this RN.  Dr. Alfred Levins attempted to obtain ultrasound IV but was unable.  RT called to art stick pt to obtain 1 set of blood cultures then will start antibiotics.

## 2016-11-15 NOTE — Progress Notes (Signed)
Anticoagulation monitoring(Lovenox):  83 yo  male ordered Lovenox 30 mg Q24h  Filed Weights   11/15/16 1055 11/15/16 1215  Weight: 119 lb 7.8 oz (54.2 kg) 156 lb 8.4 oz (71 kg)   BMI    Lab Results  Component Value Date   CREATININE 7.09 (H) 11/15/2016   CREATININE 5.91 (H) 09/29/2016   CREATININE 4.80 (H) 09/28/2016   Estimated Creatinine Clearance: 7.4 mL/min (A) (by C-G formula based on SCr of 7.09 mg/dL (H)). Hemoglobin & Hematocrit     Component Value Date/Time   HGB 10.2 (L) 11/15/2016 0932   HGB 8.4 (L) 07/27/2014 1001   HCT 30.6 (L) 11/15/2016 0932   HCT 26.4 (L) 07/27/2014 1001     Per Protocol for Patient with estCrcl < 15 ml/min and BMI < 40, will transition to Heparin 5000 units SQ Q8H.  mg

## 2016-11-15 NOTE — ED Provider Notes (Signed)
[facility] Emergency Department Provider Note  ____________________________________________  Time seen: Approximately 8:52 AM  I have reviewed the triage vital signs and the nursing notes.   HISTORY  Chief Complaint unresponsive   HPI Michael Acosta is a 83 y.o. male with h/o ESRD on HD (MWF), anemia, dementia, HTN, DM, TIA, seizuredisorder who presents from his skilled nursing facility after being found unresponsive this morning. Patient last seen normal yesterday evening. Per the facility, patient is usually talkative and able to transfer on his own. This morning one of the nurses was doing rounds and patient was not responding. He was breathing and had a pulse. EMS was called. Initially hypotensive with systolics in the 87O per facility according to EMS patient's blood pressure has been normal in route. Normal blood glucose. Patient is breathing, has a pulse, will follow commands, but will not open his eyes or answer any questions at this time.  Past Medical History:  Diagnosis Date  . Anemia of chronic disease   . Chronic pain disorder   . Dementia   . Depression   . ESRD on hemodialysis ([place])    a. MWF  . Falls frequently   . GERD (gastroesophageal reflux disease)   . Hypertension   . Insulin dependent diabetes mellitus with complications ([place])   . Mood disorder ([place])   . Peripheral neuropathy    a. diabetic neuropathy  . Peripheral vascular disease ([place])    a. s/p prior toe amputation; b. followed by [facility]  . Prostate cancer ([place])   . Seizures ([place])    EPILEPSY  . Stroke [facility])    TIA  . Weakness generalized     Patient Active Problem List   Diagnosis Date Noted  . Atherosclerosis of native arteries of the extremities with gangrene ([place]) 11/05/2016  . Acute gallstone pancreatitis 10/04/2016  . Weakness generalized   . Cholecystitis 09/25/2016  . Elevated troponin 09/25/2016  . ESRD on hemodialysis ([place]) 09/25/2016  . Dementia  09/25/2016  . PVD (peripheral vascular disease) ([place]) 09/25/2016  . IDDM (insulin dependent diabetes mellitus) ([place]) 09/25/2016  . Bacteremia 09/25/2016  . DNR (do not resuscitate) discussion 09/25/2016  . Palliative care by specialist 09/25/2016  . Sepsis ([place])   . Septic shock ([place]) 09/24/2016  . CAP (community acquired pneumonia) 04/13/2016    Past Surgical History:  Procedure Laterality Date  . COLON SURGERY     RESECTION  . INSERTION OF DIALYSIS CATHETER     SHUNT  . LEG SURGERY Left    MULTIPLE VEIN SURGERIES  . LOWER EXTREMITY ANGIOGRAPHY Right 11/07/2016   Procedure: Lower Extremity Angiography;  Surgeon: Algernon Huxley, MD;  Location: [facility];  Service: Cardiovascular;  Laterality: Right;  . TOTAL HIP ARTHROPLASTY Left     Prior to Admission medications   Medication Sig Start Date End Date Taking? Authorizing Provider  atorvastatin (LIPITOR) 20 MG tablet Take 20 mg by mouth at bedtime.    Yes [provider]  docusate sodium (COLACE) 100 MG capsule Take 100 mg by mouth 2 (two) times daily as needed for mild constipation.   Yes [provider]  gabapentin (NEURONTIN) 100 MG capsule Take 100 mg by mouth at bedtime.   Yes [provider]  glimepiride (AMARYL) 1 MG tablet Take 1 mg by mouth daily. 11/13/16  Yes [provider]  HYDROcodone-acetaminophen (NORCO/VICODIN) 5-325 MG tablet Take 1 tablet by mouth every 6 (six) hours as needed for moderate pain or  severe pain. 09/29/16  Yes Sainani, Belia Heman, MD  insulin aspart (NOVOLOG) 100 UNIT/ML injection Inject 0-10 Units into the skin See admin instructions. Three times a day before meals. At bedtime on Sunday, Tuesday, Thursday, Saturday only. Dose per sliding scale.   Yes [provider]  Insulin Detemir (LEVEMIR FLEXPEN) 100 UNIT/ML Pen Inject 15 Units into the skin at bedtime.    Yes [provider]  lamoTRIgine (LAMICTAL) 100 MG tablet Take 50-100 mg by mouth 2  (two) times daily. Takes 50mg  in the AM and 100mg  at bedtime.   Yes [provider]  levETIRAcetam (KEPPRA) 500 MG tablet Take 500 mg by mouth daily.   Yes [provider]  linagliptin (TRADJENTA) 5 MG TABS tablet Take 5 mg by mouth at bedtime.    Yes [provider]  metoCLOPramide (REGLAN) 5 MG tablet Take 5 mg by mouth 3 (three) times daily before meals.    Yes [provider]  midodrine (PROAMATINE) 5 MG tablet Take 1 tablet (5 mg total) by mouth 3 (three) times daily with meals. 04/18/16  Yes Gladstone Lighter, MD  omeprazole (PRILOSEC) 20 MG capsule Take 20 mg by mouth daily.    Yes [provider]  ondansetron (ZOFRAN) 4 MG tablet Take 4 mg by mouth every 8 (eight) hours as needed for nausea or vomiting.    Yes [provider]  promethazine (PHENERGAN) 25 MG/ML injection Inject 25 mg into the muscle every 6 (six) hours as needed for nausea or vomiting. 09/23/16 11/15/17 Yes [provider]  sevelamer carbonate (RENVELA) 800 MG tablet Take 800 mg by mouth 3 (three) times daily.    Yes [provider]  Amino Acids-Protein Hydrolys (FEEDING SUPPLEMENT, PRO-STAT SUGAR FREE 64,) LIQD Take 30 mLs by mouth 2 (two) times daily.    [provider]  sulfamethoxazole-trimethoprim (BACTRIM DS,SEPTRA DS) 800-160 MG tablet Take 1 tablet by mouth 2 (two) times daily.  10/31/16   [provider]    Allergies Aspirin; Bc powder [aspirin-salicylamide-caffeine]; Caffeine; Penicillins; and Ramipril  Family History  Problem Relation Age of Onset  . CAD Neg Hx   . Diabetes Neg Hx     Social History Social History  Substance Use Topics  . Smoking status: Never Smoker  . Smokeless tobacco: Never Used  . Alcohol use No    Review of Systems + AMS  Level 5 caveat:  Portions of the history and physical were unable to be obtained due to AMS  ____________________________________________   PHYSICAL EXAM:  VITAL  SIGNS: Vitals:   11/15/16 1415 11/15/16 1420  BP: (!) 73/64 (!) 115/50  Pulse: (!) 57 (!) 57  Resp: 14 13  Temp:      Constitutional: Eyes closed, no distress HEENT:      Head: Normocephalic and atraumatic.         Eyes: Conjunctivae are normal. Sclera is non-icteric. 2 mm equal round and reactive      Mouth/Throat: Mucous membranes are dry       Neck: Supple with no signs of meningismus. Cardiovascular: Regular rate and rhythm. No murmurs, gallops, or rubs. 2+ symmetrical distal pulses are present in all extremities. No JVD. Respiratory: Normal respiratory effort. Lungs are clear to auscultation bilaterally. No wheezes, crackles, or rhonchi.  Gastrointestinal: Soft, non tender, and non distended with positive bowel sounds. No rebound or guarding. Musculoskeletal: Nontender with normal range of motion in all extremities. No edema, cyanosis, or erythema of extremities. Neurologic: Will not open  eyes, follows commands with all 4 extremities, non verbal Skin: Skin is warm, dry and intact. No rash noted.  ____________________________________________   LABS (all labs ordered are listed, but only abnormal results are displayed)  Labs Reviewed  CBC WITH DIFFERENTIAL/PLATELET - Abnormal; Notable for the following:       Result Value   RBC 3.22 (*)    Hemoglobin 10.2 (*)    HCT 30.6 (*)    RDW 15.0 (*)    Lymphs Abs 0.7 (*)    All other components within normal limits  COMPREHENSIVE METABOLIC PANEL - Abnormal; Notable for the following:    Chloride 100 (*)    BUN 58 (*)    Creatinine, Ser 7.09 (*)    ALT 13 (*)    GFR calc non Af Amer 6 (*)    GFR calc Af Amer 7 (*)    All other components within normal limits  TROPONIN I - Abnormal; Notable for the following:    Troponin I 0.06 (*)    All other components within normal limits  MRSA PCR SCREENING  CULTURE, BLOOD (ROUTINE X 2)  CULTURE, BLOOD (ROUTINE X 2)  URINE CULTURE  AEROBIC CULTURE (SUPERFICIAL SPECIMEN)  LACTIC ACID,  PLASMA  PROCALCITONIN   ____________________________________________  EKG  ED ECG REPORT I, Rudene Re, the attending physician, personally viewed and interpreted this ECG.  Normal sinus rhythm, first-degree AV block, normal QRS and QTc intervals, normal axis, no ST elevations or depressions, diffuse T-wave flattening.  No significant changes when compared to prior from August 2017  ____________________________________________  RADIOLOGY  CXR: Heart borderline prominent. Aortic atherosclerosis. No edema or consolidation.  Aortic Atherosclerosis (ICD10-I70.0).  Head CT: No acute intracranial abnormality.  Atrophy, chronic microvascular disease.  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 ____________________________________________   PROCEDURES  Procedure(s) performed: None Procedures Critical Care performed: yes  CRITICAL CARE Performed by: Rudene Re  ?  Total critical care time: 40 min  Critical care time was exclusive of separately billable procedures and treating other patients.  Critical care was necessary to treat or prevent imminent or life-threatening deterioration.  Critical care was time spent personally by me on the following activities: development of treatment plan with patient and/or surrogate as well as nursing, discussions with consultants, evaluation of patient's response to treatment, examination of patient, obtaining history from patient or surrogate, ordering and performing treatments and interventions, ordering and review of laboratory studies, ordering and review of radiographic studies, pulse oximetry and re-evaluation of patient's condition.  ____________________________________________   INITIAL IMPRESSION / ASSESSMENT AND PLAN / ED COURSE   83 y.o. male with h/o ESRD on HD (MWF), anemia, dementia, HTN, DM, TIA, seizuredisorder who presents from his skilled nursing facility after  being found unresponsive this morning. Patient currently will not open his eyes, not answering any questions, but will follow commands when asked to squeeze hand or move his toes. He is hypothermic with normal heart rate. His blood pressure is in the soft side with systolics in the 83T. I am assuming patient has not gone to dialysis today since his 9 AM in his here in the emergency room. Pupils are equal round and reactive at 2 mm. Initially thought patient could be postictal from a seizure but the fact that he is moving extremities to command but not answering questions makes this less likely at this time. We'll send him for head CT to rule out intracranial hemorrhage. We'll check basic blood work  and chest x-ray to rule out sepsis.  Clinical Course as of Nov 16 1598  Fri Nov 15, 2016  1028 Patient with hypotension. IVF and abx ordered. Head CT and CXR with no source of infection or bleed. Family at the bedside updated.  [CV]    Clinical Course User Index [CV] Rudene Re, MD    Pertinent labs & imaging results that were available during my care of the patient were reviewed by me and considered in my medical decision making (see chart for details).    ____________________________________________   FINAL CLINICAL IMPRESSION(S) / ED DIAGNOSES  Final diagnoses:  Sepsis, due to unspecified organism [facility])  Acute encephalopathy      NEW MEDICATIONS STARTED DURING THIS VISIT:  Current Discharge Medication List       Note:  This document was prepared using Dragon voice recognition software and may include unintentional dictation errors.    Alfred Levins, [place], MD 11/15/16 [phone]

## 2016-11-15 NOTE — ED Notes (Signed)
Pt being transported to CT

## 2016-11-15 NOTE — ED Triage Notes (Signed)
Pt brought in by [facility] from [facility], per EMS pt was found unresponsive this morning, unsure of time. Last seen normal was last night. On arrival pt moving leg and responsive to voice, pt non-verbal but moaning with sternal rub by MD. .

## 2016-11-15 NOTE — Consult Note (Signed)
Name: Michael Acosta MRN: [id number] DOB: Jul 29, 1929    ADMISSION DATE:  11/15/2016 CONSULTATION DATE: 11/15/2016  REFERRING MD : Dr. Margaretmary Eddy  CHIEF COMPLAINT: Unresponsive  BRIEF PATIENT DESCRIPTION:  83 year old male admitted 08/3 with acute encephalopathy and septic shock with hypotension   SIGNIFICANT EVENTS  08/3-Pt admitted to the Stepdown Unit   STUDIES:  CT Head 08/3>>No acute intracranial abnormality. Atrophy, chronic microvascular disease. 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 occipital infarct EEG>>  HISTORY OF PRESENT ILLNESS:   This is an 83 year old male with a past medical history of TIAs, seizures, prostate cancer, PVD status post toe amputation, peripheral neuropathy, mood disorder, insulin dependant diabetes mellitus, hypertension, GERD, frequent falls, ESRD on hemodialysis, depression, dementia, chronic pain disorder, and anemia of chronic kidney disease.  Patient presented to [facility] ER via EMS 08/3 from [facility] after being found unresponsive this morning, he was last seen in his normal state of health the night of 08/2. According to patient's wife on 08/2 the patient was increasingly weak and tired, however he was able to talk to her.  Upon arrival to the ER patient was slightly responsive responding  to voice with occasional moaning and able to move bilateral lower extremities.  He was also found to be profoundly hypotensive and hypothermic ruling patient in for septic shock, therefore he received 1L NS fluid bolus with slight improvement of blood pressure.  He recently had an angiogram performed by Dr. Lucky Cowboy on 07/26 due to PAD with gangrenous right 2nd toe.  Due to current symptoms he was subsequently admitted to the stepdown unit by hospitalist team for further workup and treatment PCCM and consulted  PAST MEDICAL HISTORY :   has a past medical history of Anemia of chronic disease; Chronic pain disorder; Dementia; Depression; ESRD on  hemodialysis ([place]); Falls frequently; GERD (gastroesophageal reflux disease); Hypertension; Insulin dependent diabetes mellitus with complications ([place]); Mood disorder ([place]); Peripheral neuropathy; Peripheral vascular disease ([place]); Prostate cancer ([place]); Seizures ([place]); Stroke [facility]); and Weakness generalized.  has a past surgical history that includes Total hip arthroplasty (Left); Leg Surgery (Left); Insertion of dialysis catheter; Colon surgery; and Lower Extremity Angiography (Right, 11/07/2016). Prior to Admission medications   Medication Sig Start Date End Date Taking? Authorizing Provider  atorvastatin (LIPITOR) 20 MG tablet Take 20 mg by mouth at bedtime.    Yes [provider]  docusate sodium (COLACE) 100 MG capsule Take 100 mg by mouth 2 (two) times daily as needed for mild constipation.   Yes [provider]  gabapentin (NEURONTIN) 100 MG capsule Take 100 mg by mouth at bedtime.   Yes [provider]  glimepiride (AMARYL) 1 MG tablet Take 1 mg by mouth daily. 11/13/16  Yes [provider]  HYDROcodone-acetaminophen (NORCO/VICODIN) 5-325 MG tablet Take 1 tablet by mouth every 6 (six) hours as needed for moderate pain or severe pain. 09/29/16  Yes Sainani, Belia Heman, MD  insulin aspart (NOVOLOG) 100 UNIT/ML injection Inject 0-10 Units into the skin See admin instructions. Three times a day before meals. At bedtime on Sunday, Tuesday, Thursday, Saturday only. Dose per sliding scale.   Yes [provider]  Insulin Detemir (LEVEMIR FLEXPEN) 100 UNIT/ML Pen Inject 15 Units into the skin at bedtime.    Yes [provider]  lamoTRIgine (LAMICTAL) 100 MG tablet Take 50-100 mg by mouth 2 (two) times daily. Takes 50mg  in the AM and 100mg  at bedtime.   Yes [provider]  levETIRAcetam (KEPPRA)  500 MG tablet Take 500 mg by mouth daily.   Yes [provider]  linagliptin (TRADJENTA) 5 MG TABS tablet Take 5 mg by mouth at bedtime.    Yes  [provider]  metoCLOPramide (REGLAN) 5 MG tablet Take 5 mg by mouth 3 (three) times daily before meals.    Yes [provider]  midodrine (PROAMATINE) 5 MG tablet Take 1 tablet (5 mg total) by mouth 3 (three) times daily with meals. 04/18/16  Yes Gladstone Lighter, MD  omeprazole (PRILOSEC) 20 MG capsule Take 20 mg by mouth daily.    Yes [provider]  ondansetron (ZOFRAN) 4 MG tablet Take 4 mg by mouth every 8 (eight) hours as needed for nausea or vomiting.    Yes [provider]  promethazine (PHENERGAN) 25 MG/ML injection Inject 25 mg into the muscle every 6 (six) hours as needed for nausea or vomiting. 09/23/16 11/15/17 Yes [provider]  sevelamer carbonate (RENVELA) 800 MG tablet Take 800 mg by mouth 3 (three) times daily.    Yes [provider]  Amino Acids-Protein Hydrolys (FEEDING SUPPLEMENT, PRO-STAT SUGAR FREE 64,) LIQD Take 30 mLs by mouth 2 (two) times daily.    [provider]  sulfamethoxazole-trimethoprim (BACTRIM DS,SEPTRA DS) 800-160 MG tablet Take 1 tablet by mouth 2 (two) times daily.  10/31/16   [provider]   Allergies  Allergen Reactions  . Aspirin Other (See Comments)    Bleeding   . Bc Powder [Aspirin-Salicylamide-Caffeine]     MAR did not specify a reaction  . Caffeine     MAR did not specify a reaction  . 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  . Ramipril Other (See Comments)    Imported from [facility] - reaction: unknown     FAMILY HISTORY:  family history is not on file. SOCIAL HISTORY:  reports that he has never smoked. He has never used smokeless tobacco. He reports that he does not drink alcohol or use drugs.  REVIEW OF SYSTEMS:   Unable to assess pt lethargic   SUBJECTIVE:  Pt remains lethargic   VITAL SIGNS: Temp:  [96.3 F (35.7 C)] 96.3 F (35.7 C) (08/03 0858) Pulse Rate:  [55-78] 59 (08/03 1050) Resp:  [11-30] 11 (08/03 1050) BP: (64-122)/(20-101) 87/45 (08/03 1050) SpO2:  [93 %-100 %] 100 % (08/03 1050) Weight:  [54.2 kg (119 lb 7.8 oz)] 54.2 kg (119 lb 7.8 oz) (08/03 1055)  PHYSICAL EXAMINATION: General: acutely ill appearing AA male, NAD Neuro: lethargic, follows commands, PERRL HEENT: supple, no JVD Cardiovascular: sinus brady, s1s2, no M/R/G, 1+ palpable distal pulses  Lungs: diminished throughout, even, non labored Abdomen: +BS x4, soft, non tender, non distended  Musculoskeletal: normal bulk, no edema  Skin: gangrenous 2nd toe with small amount of pus draining from wound    Recent Labs Lab 11/15/16 0932  NA 139  K 3.8  CL 100*  CO2 25  BUN 58*  CREATININE 7.09*  GLUCOSE 94    Recent Labs Lab 11/15/16 0932  HGB 10.2*  HCT 30.6*  WBC 6.5  PLT 194   Ct Head Wo Contrast  Result Date: 11/15/2016 CLINICAL DATA:  Altered level of consciousness. EXAM: CT HEAD WITHOUT CONTRAST TECHNIQUE: Contiguous axial images were obtained from the base of the skull through the vertex without intravenous contrast. COMPARISON:  04/25/2013 FINDINGS: Brain: There is atrophy and chronic small vessel disease changes. Bilateral basal ganglia and thalamic lacunar infarcts, stable. Old right occipital infarct. No acute intracranial abnormality. Specifically, no hemorrhage, hydrocephalus, mass lesion, acute infarction, or significant intracranial injury. Vascular: No hyperdense vessel or unexpected calcification. Skull: No acute calvarial abnormality. Sinuses/Orbits: Visualized paranasal  sinuses and mastoids clear. Orbital soft tissues unremarkable. Other: None IMPRESSION: No acute intracranial abnormality. Atrophy, chronic microvascular disease. 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 Electronically Signed   By: Rolm Baptise M.D.   On: 11/15/2016 09:15   Dg Chest Portable 1 View  Result Date: 11/15/2016 CLINICAL DATA:  Patient found unresponsive. Hypertension. Renal failure. EXAM: PORTABLE CHEST 1 VIEW COMPARISON:  September 24, 2016 FINDINGS: There is no edema or consolidation. Heart is borderline prominent with pulmonary vascularity within normal limits. No adenopathy. There is aortic atherosclerosis. There is a brachials stent on the left. There is degenerative change in each shoulder. IMPRESSION: Heart borderline prominent. Aortic atherosclerosis. No edema or consolidation. Aortic Atherosclerosis (ICD10-I70.0). Electronically Signed   By: Lowella Grip III M.D.   On: 11/15/2016 09:07    ASSESSMENT / PLAN: Acute encephalopathy secondary to sepsis vs. unwitnessed seizure Septic shock with hypotension likely secondary to gangrenous right 2nd toe  ESRD on hemodialysis: M-W-F Hx: Seizure Disorder, Diabetes Mellitus, and Hyperlipidemia P: Supplemental O2 to maintain O2 sats greater than 92%  Trend WBC and monitor fever curve Trend PCT and lactic acid Continue empiric antibiotics  Follow cultures Will obtain wound culture of gangrenous right 2nd toe  EEG pending Continue outpatient Keppra Maintain map greater than 60 Nephrology consulted appreciate input-hemodialysis per recommendations May need Vascular consultation Continuous telemetry monitoring CBG's q4hrs and SSI  Avoid sedating medications  Marda Stalker, [place] Pager [phone] (please enter 7 digits) PCCM Consult Pager [phone] (please enter 7 digits)   PCCM ATTENDING ATTESTATION:  I have evaluated patient with the APP [place],  reviewed database in its entirety and discussed care plan in detail. In addition, this patient was discussed on multidisciplinary rounds.   Brought to ED from SNF with altered mental status. Found to be hypotensive. Treated initially as severe sepsis. By time of arrival to the ICU, BP normal. Remains somnolent. There is no clear source of sepsis although he does have a small area of gangrene on his toe.  Important exam findings: Somnolent NAD HEENT WNL Chest clear Regular, no M NABS, soft Gangrenous second toe on R foot with some purulence  Major problems addressed by PCCM team: Acute AMS Baseline severe dementia Seizure disorder SNF resident ESRD Gangrenous toe Hypotension - unclear etiology   PLAN/REC: Monitor and ICU/SDU today. Broad-spectrum antibiotics as above. EEG ordered. DNR   Merton Border, MD PCCM service Mobile [phone] Pager [phone] 11/15/2016 1:06 PM

## 2016-11-15 NOTE — ED Notes (Signed)
Dr. Alfred Levins informed pts blood pressure is registering low, orders given for 500 mL bolus.

## 2016-11-15 NOTE — ED Notes (Signed)
IV does not pull back for blood, Pt has restricted extremity on the left side. Lab called to draw blood for pt

## 2016-11-15 NOTE — ED Notes (Signed)
Pt returned from CT. Lab at bedside attempting to draw blood.

## 2016-11-15 NOTE — Progress Notes (Signed)
Pharmacy Antibiotic Note  Michael Acosta is a 83 y.o. male with a h/o ESRD on HD admitted on 8/3/2018with sepsis.  Pharmacy has been consulted for vancomycin and cefepime dosing.  Plan: Vancomycin 1500 mg iv once then 750 mg IV qHD. Will need to follow plans for HD schedule and check a level prior to the third dialysis session for a goal of 15-25 mcg/ml.   Cefepime 1 g iv q 24 hours.  Height: 5\' 8"  (172.7 cm) Weight: 156 lb 8.4 oz (71 kg) IBW/kg (Calculated) : 68.4  Temp (24hrs), Avg:96.8 F (36 C), Min:96.3 F (35.7 C), Max:97.3 F (36.3 C)   Recent Labs Lab 11/15/16 0932 11/15/16 1145  WBC 6.5  --   CREATININE 7.09*  --   LATICACIDVEN  --  0.5    Estimated Creatinine Clearance: 7.4 mL/min (A) (by C-G formula based on SCr of 7.09 mg/dL (H)).    Allergies  Allergen Reactions  . Aspirin Other (See Comments)    Bleeding   . Bc Powder [Aspirin-Salicylamide-Caffeine]     MAR did not specify a reaction  . Caffeine     MAR did not specify a reaction  . 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  . Ramipril Other (See Comments)    Imported from [facility] - reaction: unknown    Antimicrobials this admission: 8/3 Cefepime >>  8/3 vancomycin >>   Dose adjustments this admission:   Microbiology results: 8/3 BCx: sent 8/3 UCx: sent  8/3 MRSA PCR: negative  Thank you for allowing pharmacy to be a part of this patient's care.  Ulice Dash D 11/15/2016 2:16 PM

## 2016-11-15 NOTE — Progress Notes (Signed)
[facility], [place] 11/15/16  Subjective:   Patient known to our practice from previous admissions Presents from [facility] when found unresponsive. EMS noted hypotension. Brought to ER for evaluation At present he is on pressor- phenyleprine Able to follow basic commands  Objective:  Vital signs in last 24 hours:  Temp:  [96.3 F (35.7 C)-97.3 F (36.3 C)] 97.1 F (36.2 C) (08/03 1600) Pulse Rate:  [54-78] 54 (08/03 1800) Resp:  [8-30] 12 (08/03 1800) BP: (46-142)/(16-102) 93/50 (08/03 1800) SpO2:  [93 %-100 %] 99 % (08/03 1800) Weight:  [54.2 kg (119 lb 7.8 oz)-71 kg (156 lb 8.4 oz)] 71 kg (156 lb 8.4 oz) (08/03 1215)  Weight change:  Filed Weights   11/15/16 1055 11/15/16 1215  Weight: 54.2 kg (119 lb 7.8 oz) 71 kg (156 lb 8.4 oz)    Intake/Output:    Intake/Output Summary (Last 24 hours) at 11/15/16 1909 Last data filed at 11/15/16 1700  Gross per 24 hour  Intake          2189.91 ml  Output                0 ml  Net          2189.91 ml     Physical Exam: General: Chronically ill appearing  HEENT Anicteric, pupils are round  Neck No masses  Pulm/lungs Clear b/l  CVS/Heart Bradycardic, no rub  Abdomen:  Soft, NT  Extremities: No edema  Neurologic: Arousable, able to follow commands  Skin: No acute rashes  Access: Left upper arm AVF       Basic Metabolic Panel:   Recent Labs Lab 11/15/16 0932  NA 139  K 3.8  CL 100*  CO2 25  GLUCOSE 94  BUN 58*  CREATININE 7.09*  CALCIUM 8.9     CBC:  Recent Labs Lab 11/15/16 0932  WBC 6.5  NEUTROABS 5.1  HGB 10.2*  HCT 30.6*  MCV 95.2  PLT 194     Lab Results  Component Value Date   HEPBSAG Negative 09/25/2016   HEPBSAB Non Reactive 09/25/2016   HEPBIGM Negative 09/24/2016      Microbiology:  Recent Results (from the past 240 hour(s))  MRSA PCR Screening     Status: None   Collection Time: 11/15/16 12:38 PM  Result Value Ref Range Status   MRSA by PCR  NEGATIVE NEGATIVE Final    Comment:        The GeneXpert MRSA Assay (FDA approved for NASAL specimens only), is one component of a comprehensive MRSA colonization surveillance program. It is not intended to diagnose MRSA infection nor to guide or monitor treatment for MRSA infections.     Coagulation Studies: No results for input(s): LABPROT, INR in the last 72 hours.  Urinalysis: No results for input(s): COLORURINE, LABSPEC, PHURINE, GLUCOSEU, HGBUR, BILIRUBINUR, KETONESUR, PROTEINUR, UROBILINOGEN, NITRITE, LEUKOCYTESUR in the last 72 hours.  Invalid input(s): APPERANCEUR    Imaging: Ct Head Wo Contrast  Result Date: 11/15/2016 CLINICAL DATA:  Altered level of consciousness. EXAM: CT HEAD WITHOUT CONTRAST TECHNIQUE: Contiguous axial images were obtained from the base of the skull through the vertex without intravenous contrast. COMPARISON:  04/25/2013 FINDINGS: Brain: There is atrophy and chronic small vessel disease changes. Bilateral basal ganglia and thalamic lacunar infarcts, stable. Old right occipital infarct. No acute intracranial abnormality. Specifically, no hemorrhage, hydrocephalus, mass lesion, acute infarction, or significant intracranial injury. Vascular: No hyperdense vessel or unexpected calcification. Skull: No acute calvarial abnormality.  Sinuses/Orbits: Visualized paranasal sinuses and mastoids clear. Orbital soft tissues unremarkable. Other: None IMPRESSION: No acute intracranial abnormality. Atrophy, chronic microvascular disease. 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 Electronically Signed   By: Rolm Baptise M.D.   On: 11/15/2016 09:15   Dg Chest Portable 1 View  Result Date: 11/15/2016 CLINICAL DATA:  Patient found unresponsive. Hypertension. Renal failure. EXAM: PORTABLE CHEST 1 VIEW COMPARISON:  September 24, 2016 FINDINGS: There is no edema or consolidation. Heart is borderline prominent with pulmonary vascularity  within normal limits. No adenopathy. There is aortic atherosclerosis. There is a brachials stent on the left. There is degenerative change in each shoulder. IMPRESSION: Heart borderline prominent. Aortic atherosclerosis. No edema or consolidation. Aortic Atherosclerosis (ICD10-I70.0). Electronically Signed   By: Lowella Grip III M.D.   On: 11/15/2016 09:07     Medications:   . sodium chloride 10 mL/hr at 11/15/16 1323  . [START ON 11/16/2016] ceFEPime (MAXIPIME) IV    . levETIRAcetam Stopped (11/15/16 1420)  . phenylephrine (NEO-SYNEPHRINE) Adult infusion 80 mcg/min (11/15/16 1852)  . vancomycin     . heparin subcutaneous  5,000 Units Subcutaneous Q8H  . insulin aspart  0-5 Units Subcutaneous QHS  . insulin aspart  0-9 Units Subcutaneous TID WC   ondansetron **OR** ondansetron (ZOFRAN) IV, vancomycin  Assessment/ Plan:  83 y.o. African [place] male with end-stage renal disease on hemodialysis, hypertension, diabetes type 2, insulin-dependent, seizure disorder, peripheral neuropathy, vascular dementia  MWF [facility] Left arm AVF  1. End Stage Renal Disease: MWF.   - patient currently hypotensive and critically ill - will defer dialysis to tomorrow  2. AOCKD - EPO with HD  3. SHPTH - monitor phos  4. Hypotension - work up for sepsis in progress - pressors     LOS: 0 [facility] 8/3/20187:09 PM  [facility] [place], [place]

## 2016-11-15 NOTE — H&P (Addendum)
[facility] NAME: Michael Acosta    MR#:  [id number]  DATE OF BIRTH:  02/05/1930  DATE OF ADMISSION:  11/15/2016  PRIMARY CARE PHYSICIAN: Juluis Pitch, MD   REQUESTING/REFERRING PHYSICIAN: Dr. Alfred Levins   CHIEF COMPLAINT:  Unresponsive  HISTORY OF PRESENT ILLNESS:  Michael Acosta  is a 83 y.o. male with a known history of End-stage renal disease, on hemodialysis on Monday, Wednesday and Friday, dementia, insulin dependent diabetes mellitus, history of seizures multiple other medical problems is brought into the ED for being unresponsive from [facility]. According to the wife patient was weak and tired yesterday afternoon but was talking to her. Patient is not responding today morning and CT head is negative and chest x-ray with no acute findings. Patient was found to be hypotensive and hypothermic. After giving 1 L fluid bolus he was trying to open his eyes and spontaneously moving his extremities. Blood cultures were obtained and the patient is started on broad-spectrum IV antibiotics.. Patient does not make any urine. Hospitalist team is called to admit the patient. Patient was just recently admitted to the hospital for sepsis on June 12 and got discharged on June 17. During the previous admission  the source of infection was community acquired pneumonia and cholecystitis . Wife and daughter are at bedside   PAST MEDICAL HISTORY:    Past Medical History:  Diagnosis Date  . Anemia of chronic disease   . Chronic pain disorder   . Dementia   . Depression   . ESRD on hemodialysis ([place])    a. MWF  . Falls frequently   . GERD (gastroesophageal reflux disease)   . Hypertension   . Insulin dependent diabetes mellitus with complications ([place])   . Mood disorder ([place])   . Peripheral neuropathy    a. diabetic neuropathy  . Peripheral vascular disease ([place])    a. s/p prior toe amputation; b. followed by [facility]  . Prostate cancer  ([place])   . Seizures ([place])    EPILEPSY  . Stroke [facility])    TIA  . Weakness generalized     PAST SURGICAL HISTOIRY:   Past Surgical History:  Procedure Laterality Date  . COLON SURGERY     RESECTION  . INSERTION OF DIALYSIS CATHETER     SHUNT  . LEG SURGERY Left    MULTIPLE VEIN SURGERIES  . LOWER EXTREMITY ANGIOGRAPHY Right 11/07/2016   Procedure: Lower Extremity Angiography;  Surgeon: Algernon Huxley, MD;  Location: [place] [facility] LAB;  Service: Cardiovascular;  Laterality: Right;  . TOTAL HIP ARTHROPLASTY Left     SOCIAL HISTORY:   Social History  Substance Use Topics  . Smoking status: Never Smoker  . Smokeless tobacco: Never Used  . Alcohol use No    FAMILY HISTORY:   Family History  Problem Relation Age of Onset  . CAD Neg Hx   . Diabetes Neg Hx     DRUG ALLERGIES:   Allergies  Allergen Reactions  . Aspirin Other (See Comments)    Bleeding   . Bc Powder [Aspirin-Salicylamide-Caffeine]     MAR did not specify a reaction  . Caffeine     MAR did not specify a reaction  . 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  . Ramipril Other (See Comments)    Imported from [facility] - reaction: unknown    REVIEW OF SYSTEMS:  CONSTITUTIONAL: No fever, fatigue or weakness.  EYES: No blurred or double vision.  EARS, NOSE, AND THROAT: No tinnitus or ear pain.  RESPIRATORY: No cough, shortness of breath, wheezing or hemoptysis.  CARDIOVASCULAR: No chest pain, orthopnea, edema.  GASTROINTESTINAL: No nausea,  vomiting, diarrhea or abdominal pain.  GENITOURINARY: No dysuria, hematuria.  ENDOCRINE: No polyuria, nocturia,  HEMATOLOGY: No anemia, easy bruising or bleeding SKIN: No rash or lesion. MUSCULOSKELETAL: No joint pain or arthritis.   NEUROLOGIC: No tingling, numbness, weakness.  PSYCHIATRY: No anxiety or depression.   MEDICATIONS AT HOME:   Prior to Admission medications   Medication Sig Start Date End Date Taking? Authorizing Provider  atorvastatin (LIPITOR) 20 MG tablet Take 20 mg by mouth at bedtime.    Yes [provider]  docusate sodium (COLACE) 100 MG capsule Take 100 mg by mouth 2 (two) times daily as needed for mild constipation.   Yes [provider]  gabapentin (NEURONTIN) 100 MG capsule Take 100 mg by mouth at bedtime.   Yes [provider]  glimepiride (AMARYL) 1 MG tablet Take 1 mg by mouth daily. 11/13/16  Yes [provider]  HYDROcodone-acetaminophen (NORCO/VICODIN) 5-325 MG tablet Take 1 tablet by mouth every 6 (six) hours as needed for moderate pain or severe pain. 09/29/16  Yes Sainani, Belia Heman, MD  insulin aspart (NOVOLOG) 100 UNIT/ML injection Inject 0-10 Units into the skin See admin instructions. Three times a day before meals. At bedtime on Sunday, Tuesday, Thursday, Saturday only. Dose per sliding scale.   Yes [provider]  Insulin Detemir (LEVEMIR FLEXPEN) 100 UNIT/ML Pen Inject 15 Units into the skin at bedtime.    Yes [provider]  lamoTRIgine (LAMICTAL) 100 MG tablet Take 50-100 mg by mouth 2 (two) times daily. Takes 50mg  in the AM and 100mg  at bedtime.   Yes [provider]  levETIRAcetam (KEPPRA) 500 MG tablet Take 500 mg by mouth daily.   Yes [provider]  linagliptin (TRADJENTA) 5 MG TABS tablet Take 5 mg by mouth at bedtime.    Yes [provider]  metoCLOPramide (REGLAN) 5 MG tablet Take 5 mg by mouth 3 (three) times daily before meals.    Yes [provider]   midodrine (PROAMATINE) 5 MG tablet Take 1 tablet (5 mg total) by mouth 3 (three) times daily with meals. 04/18/16  Yes [place] Lighter, MD  omeprazole (PRILOSEC) 20 MG capsule Take 20 mg by mouth daily.    Yes [provider]  ondansetron (ZOFRAN) 4 MG tablet Take 4 mg by mouth every 8 (eight) hours as needed for nausea or vomiting.    Yes [provider]  promethazine (PHENERGAN) 25 MG/ML injection Inject 25 mg into the muscle every 6 (six) hours as needed for nausea or vomiting. 09/23/16 11/15/17 Yes [provider]  sevelamer carbonate (RENVELA) 800 MG tablet Take 800 mg by mouth 3 (three) times daily.    Yes [provider]  Amino Acids-Protein Hydrolys (FEEDING SUPPLEMENT, PRO-STAT SUGAR FREE 64,) LIQD Take 30 mLs by mouth 2 (two) times daily.    [provider]  sulfamethoxazole-trimethoprim (BACTRIM DS,SEPTRA  DS) 800-160 MG tablet Take 1 tablet by mouth 2 (two) times daily.  10/31/16   [provider]      VITAL SIGNS:  Blood pressure (!) 87/45, pulse (!) 59, temperature (!) 96.3 F (35.7 C), temperature source Rectal, resp. rate 11, weight 54.2 kg (119 lb 7.8 oz), SpO2 100 %.  PHYSICAL EXAMINATION:  GENERAL:  83 y.o.-year-old patient lying in the bed with no acute distress.  EYES: Pupils Are dilated, equal, round, reactive to light and accommodation. No scleral icterus.  HEENT: Head atraumatic, normocephalic. Oropharynx and nasopharynx clear. Dry mucous membranes NECK:  Supple, no jugular venous distention. No goiter LUNGS: Normal breath sounds bilaterally, no wheezing, rales,rhonchi or crepitation. No use of accessory muscles of respiration.  CARDIOVASCULAR: S1, S2 normal. No murmurs, rubs, or gallops.  ABDOMEN: Soft, nontender, nondistended. Bowel sounds present. EXTREMITIES: No pedal edema, cyanosis, or clubbing.  NEUROLOGIC: Patient is trying to open his eyes to verbal commands, spontaneously moving extremities. Lethargic   PSYCHIATRIC: The patient is lethargic and is spontaneous and moving his extremities SKIN: No obvious rash, lesion, or ulcer.   LABORATORY PANEL:   CBC  Recent Labs Lab 11/15/16 0932  WBC 6.5  HGB 10.2*  HCT 30.6*  PLT 194   ------------------------------------------------------------------------------------------------------------------  Chemistries   Recent Labs Lab 11/15/16 0932  NA 139  K 3.8  CL 100*  CO2 25  GLUCOSE 94  BUN 58*  CREATININE 7.09*  CALCIUM 8.9  AST 23  ALT 13*  ALKPHOS 98  BILITOT 0.5   ------------------------------------------------------------------------------------------------------------------  Cardiac Enzymes  Recent Labs Lab 11/15/16 0932  TROPONINI 0.06*   ------------------------------------------------------------------------------------------------------------------  RADIOLOGY:  Ct Head Wo Contrast  Result Date: 11/15/2016 CLINICAL DATA:  Altered level of consciousness. EXAM: CT HEAD WITHOUT CONTRAST TECHNIQUE: Contiguous axial images were obtained from the base of the skull through the vertex without intravenous contrast. COMPARISON:  04/25/2013 FINDINGS: Brain: There is atrophy and chronic small vessel disease changes. Bilateral basal ganglia and thalamic lacunar infarcts, stable. Old right occipital infarct. No acute intracranial abnormality. Specifically, no hemorrhage, hydrocephalus, mass lesion, acute infarction, or significant intracranial injury. Vascular: No hyperdense vessel or unexpected calcification. Skull: No acute calvarial abnormality. Sinuses/Orbits: Visualized paranasal sinuses and mastoids clear. Orbital soft tissues unremarkable. Other: None IMPRESSION: No acute intracranial abnormality. Atrophy, chronic microvascular disease. 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 Electronically Signed   By: Rolm Baptise M.D.   On: 11/15/2016 09:15   Dg Chest Portable 1  View  Result Date: 11/15/2016 CLINICAL DATA:  Patient found unresponsive. Hypertension. Renal failure. EXAM: PORTABLE CHEST 1 VIEW COMPARISON:  September 24, 2016 FINDINGS: There is no edema or consolidation. Heart is borderline prominent with pulmonary vascularity within normal limits. No adenopathy. There is aortic atherosclerosis. There is a brachials stent on the left. There is degenerative change in each shoulder. IMPRESSION: Heart borderline prominent. Aortic atherosclerosis. No edema or consolidation. Aortic Atherosclerosis (ICD10-I70.0). Electronically Signed   By: Lowella Grip III M.D.   On: 11/15/2016 09:07    EKG:   Orders placed or performed during the hospital encounter of 11/15/16  . ED EKG  . ED EKG  . EKG 12-Lead  . EKG 12-Lead    IMPRESSION AND PLAN:   Michael Acosta  is a 83 y.o. male with a known history of End-stage renal disease, on hemodialysis on Monday, Wednesday and Friday, dementia, insulin dependent diabetes mellitus, history of seizures multiple other medical problems is brought into the ED  for being unresponsive from [facility].  #Unresponsive-could be from sepsis Patient meets septic criteria with hypotension and hypothermia Admit to intensive care unit Blood cultures 2 are ordered Patient has received 1 L fluid bolus will continue IV fluids cautiously keeping in mind that he is a dialysis patient Patient does not make any urine Chest x-rays negative CT head is negative Broad-spectrum IV antibiotics cefepime and vancomycin Consult infectious disease, intensivist  #History of seizures No recent episodes of seizures,No witnessed seizure in the past 24 hours Will get EEG Continue Keppra in the IV form. Hold Lamictal as patient is unresponsive  #Insulin-dependent diabetes mellitus Currently patient is nothing by mouth being unresponsive Provide insulin sliding scale  #End stage renal disease on hemodialysis Patient gets hemodialysis on Monday,  Wednesday and Friday, last dialysis was on Wednesday Consult nephrology  #Hyperlipidemia Hold by mouth medications as patient is unresponsive at this time  GI prophylaxis with Protonix IV DVT prophylaxis with Lovenox subcutaneous   All the records are reviewed and case discussed with ED provider. Management plans discussed with the patient, family and they are in agreement.  CODE STATUS: DNR/ Wife and daughter at the healthcare power of attorney  TOTAL CRITICAL CARE TIME TAKING CARE OF THIS PATIENT: 43minutes.   Note: This dictation was prepared with Dragon dictation along with smaller phrase technology. Any transcriptional errors that result from this process are unintentional.  Nicholes Mango M.D on 11/15/2016 at 11:17 AM  Between 7am to 6pm - Pager - [phone]  After 6pm go to [email or web address] - password [email or web address] [email or web address]  Office  [phone]  CC: Primary care physician; Juluis Pitch, MD

## 2016-11-15 NOTE — ED Notes (Signed)
Secondary IV access unable to be obtained by this RN. Spoke with Dr. Alfred Levins who will attempt to obtain ultrasound IV prior to starting antibiotics.

## 2016-11-16 LAB — GLUCOSE, CAPILLARY
GLUCOSE-CAPILLARY: 215 mg/dL — AB (ref 65–99)
GLUCOSE-CAPILLARY: 71 mg/dL (ref 65–99)
GLUCOSE-CAPILLARY: 94 mg/dL (ref 65–99)
Glucose-Capillary: 155 mg/dL — ABNORMAL HIGH (ref 65–99)
Glucose-Capillary: 164 mg/dL — ABNORMAL HIGH (ref 65–99)
Glucose-Capillary: 81 mg/dL (ref 65–99)
Glucose-Capillary: 84 mg/dL (ref 65–99)

## 2016-11-16 LAB — COMPREHENSIVE METABOLIC PANEL
ALT: 13 U/L — AB (ref 17–63)
ANION GAP: 12 (ref 5–15)
AST: 20 U/L (ref 15–41)
Albumin: 3.2 g/dL — ABNORMAL LOW (ref 3.5–5.0)
Alkaline Phosphatase: 91 U/L (ref 38–126)
BUN: 60 mg/dL — ABNORMAL HIGH (ref 6–20)
CHLORIDE: 105 mmol/L (ref 101–111)
CO2: 22 mmol/L (ref 22–32)
Calcium: 8.4 mg/dL — ABNORMAL LOW (ref 8.9–10.3)
Creatinine, Ser: 7.3 mg/dL — ABNORMAL HIGH (ref 0.61–1.24)
GFR calc non Af Amer: 6 mL/min — ABNORMAL LOW (ref >60)
GFR, EST AFRICAN AMERICAN: 7 mL/min — AB (ref >60)
Glucose, Bld: 78 mg/dL (ref 65–99)
Potassium: 4.3 mmol/L (ref 3.5–5.1)
SODIUM: 139 mmol/L (ref 135–145)
Total Bilirubin: 0.5 mg/dL (ref 0.3–1.2)
Total Protein: 6.6 g/dL (ref 6.5–8.1)

## 2016-11-16 LAB — CBC
HCT: 30.3 % — ABNORMAL LOW (ref 40.0–52.0)
Hemoglobin: 10 g/dL — ABNORMAL LOW (ref 13.0–18.0)
MCH: 31.9 pg (ref 26.0–34.0)
MCHC: 33 g/dL (ref 32.0–36.0)
MCV: 96.6 fL (ref 80.0–100.0)
PLATELETS: 187 10*3/uL (ref 150–440)
RBC: 3.14 MIL/uL — AB (ref 4.40–5.90)
RDW: 15 % — ABNORMAL HIGH (ref 11.5–14.5)
WBC: 4.6 10*3/uL (ref 3.8–10.6)

## 2016-11-16 LAB — PHOSPHORUS: Phosphorus: 4.8 mg/dL — ABNORMAL HIGH (ref 2.5–4.6)

## 2016-11-16 LAB — AMMONIA: AMMONIA: 14 umol/L (ref 9–35)

## 2016-11-16 LAB — MAGNESIUM: Magnesium: 2 mg/dL (ref 1.7–2.4)

## 2016-11-16 LAB — PROCALCITONIN: Procalcitonin: 0.14 ng/mL

## 2016-11-16 MED ORDER — VANCOMYCIN HCL IN DEXTROSE 750-5 MG/150ML-% IV SOLN
750.0000 mg | Freq: Once | INTRAVENOUS | Status: DC
  Filled 2016-11-16: qty 150

## 2016-11-16 MED ORDER — ATORVASTATIN CALCIUM 20 MG PO TABS
20.0000 mg | ORAL_TABLET | Freq: Every day | ORAL | Status: DC
  Administered 2016-11-16 – 2016-11-18 (×3): 20 mg via ORAL
  Filled 2016-11-16: qty 1
  Filled 2016-11-16: qty 2
  Filled 2016-11-16: qty 1

## 2016-11-16 MED ORDER — LAMOTRIGINE 25 MG PO TABS: 50.0000 mg | ORAL_TABLET | Freq: Two times a day (BID) | ORAL | Status: DC

## 2016-11-16 MED ORDER — INSULIN ASPART 100 UNIT/ML ~~LOC~~ SOLN
0.0000 [IU] | Freq: Three times a day (TID) | SUBCUTANEOUS | Status: DC
  Administered 2016-11-16 – 2016-11-17 (×2): 3 [IU] via SUBCUTANEOUS
  Filled 2016-11-16 (×2): qty 1

## 2016-11-16 MED ORDER — PANTOPRAZOLE SODIUM 40 MG PO TBEC
40.0000 mg | DELAYED_RELEASE_TABLET | Freq: Every day | ORAL | Status: DC
  Administered 2016-11-16 – 2016-11-17 (×2): 40 mg via ORAL
  Filled 2016-11-16 (×2): qty 1

## 2016-11-16 MED ORDER — GABAPENTIN 100 MG PO CAPS
100.0000 mg | ORAL_CAPSULE | Freq: Every day | ORAL | Status: DC
  Administered 2016-11-16 – 2016-11-18 (×3): 100 mg via ORAL
  Filled 2016-11-16 (×3): qty 1

## 2016-11-16 MED ORDER — INSULIN ASPART 100 UNIT/ML ~~LOC~~ SOLN: 0.0000 [IU] | Freq: Every day | SUBCUTANEOUS | Status: DC

## 2016-11-16 MED ORDER — LEVETIRACETAM 500 MG PO TABS
500.0000 mg | ORAL_TABLET | Freq: Every day | ORAL | Status: DC
  Administered 2016-11-16 – 2016-11-19 (×4): 500 mg via ORAL
  Filled 2016-11-16 (×4): qty 1

## 2016-11-16 MED ORDER — SEVELAMER CARBONATE 800 MG PO TABS
800.0000 mg | ORAL_TABLET | Freq: Three times a day (TID) | ORAL | Status: DC
  Administered 2016-11-16 – 2016-11-19 (×6): 800 mg via ORAL
  Filled 2016-11-16 (×6): qty 1

## 2016-11-16 MED ORDER — LAMOTRIGINE 100 MG PO TABS
100.0000 mg | ORAL_TABLET | Freq: Every day | ORAL | Status: DC
  Administered 2016-11-16 – 2016-11-18 (×3): 100 mg via ORAL
  Filled 2016-11-16 (×3): qty 1

## 2016-11-16 MED ORDER — SODIUM CHLORIDE 0.9 % IV SOLN: INTRAVENOUS | Status: DC | PRN

## 2016-11-16 MED ORDER — MIDODRINE HCL 5 MG PO TABS
5.0000 mg | ORAL_TABLET | Freq: Three times a day (TID) | ORAL | Status: DC
  Administered 2016-11-16 – 2016-11-19 (×7): 5 mg via ORAL
  Filled 2016-11-16 (×10): qty 1

## 2016-11-16 MED ORDER — DOCUSATE SODIUM 100 MG PO CAPS: 100.0000 mg | ORAL_CAPSULE | Freq: Two times a day (BID) | ORAL | Status: DC | PRN

## 2016-11-16 MED ORDER — LAMOTRIGINE 25 MG PO TABS
50.0000 mg | ORAL_TABLET | Freq: Every day | ORAL | Status: DC
  Administered 2016-11-17 – 2016-11-19 (×2): 50 mg via ORAL
  Filled 2016-11-16 (×2): qty 2

## 2016-11-16 NOTE — Progress Notes (Signed)
[facility], [place] 11/16/16  Subjective:   Patient known to our practice from previous admissions Presents from [facility] when found unresponsive. EMS noted hypotension. Brought to ER for evaluation At present he is able to follow basic commands No SOB  Objective:  Vital signs in last 24 hours:  Temp:  [96.8 F (36 C)-97.7 F (36.5 C)] 97.6 F (36.4 C) (08/04 0830) Pulse Rate:  [48-79] 69 (08/04 1000) Resp:  [0-23] 12 (08/04 1000) BP: (46-142)/(16-102) 101/55 (08/04 1000) SpO2:  [79 %-100 %] 98 % (08/04 1000) FiO2 (%):  [25 %] 25 % (08/04 0000) Weight:  [71 kg (156 lb 8.4 oz)] 71 kg (156 lb 8.4 oz) (08/03 1215)  Weight change:  Filed Weights   11/15/16 1055 11/15/16 1215  Weight: 54.2 kg (119 lb 7.8 oz) 71 kg (156 lb 8.4 oz)    Intake/Output:    Intake/Output Summary (Last 24 hours) at 11/16/16 1130 Last data filed at 11/16/16 1038  Gross per 24 hour  Intake          2358.41 ml  Output                0 ml  Net          2358.41 ml     Physical Exam: General: Chronically ill appearing  HEENT Anicteric,    Neck No masses  Pulm/lungs Clear b/l  CVS/Heart no rub  Abdomen:  Soft, NT  Extremities: No edema  Neurologic: Arousable, able to follow few basic commands  Skin: No acute rashes  Access: Left upper arm AVF       Basic Metabolic Panel:   Recent Labs Lab 11/15/16 0932 11/16/16 0048  NA 139 139  K 3.8 4.3  CL 100* 105  CO2 25 22  GLUCOSE 94 78  BUN 58* 60*  CREATININE 7.09* 7.30*  CALCIUM 8.9 8.4*  MG  --  2.0  PHOS  --  4.8*     CBC:  Recent Labs Lab 11/15/16 0932 11/16/16 0048  WBC 6.5 4.6  NEUTROABS 5.1  --   HGB 10.2* 10.0*  HCT 30.6* 30.3*  MCV 95.2 96.6  PLT 194 187      Lab Results  Component Value Date   HEPBSAG Negative 09/25/2016   HEPBSAB Non Reactive 09/25/2016   HEPBIGM Negative 09/24/2016      Microbiology:  Recent Results (from the past 240 hour(s))  Blood culture  (routine x 2)     Status: None (Preliminary result)   Collection Time: 11/15/16 11:45 AM  Result Value Ref Range Status   Specimen Description BLOOD BLOOD RIGHT WRIST  Final   Special Requests   Final    BOTTLES DRAWN AEROBIC AND ANAEROBIC Blood Culture adequate volume   Culture NO GROWTH < 24 HOURS  Final   Report Status PENDING  Incomplete  MRSA PCR Screening     Status: None   Collection Time: 11/15/16 12:38 PM  Result Value Ref Range Status   MRSA by PCR NEGATIVE NEGATIVE Final    Comment:        The GeneXpert MRSA Assay (FDA approved for NASAL specimens only), is one component of a comprehensive MRSA colonization surveillance program. It is not intended to diagnose MRSA infection nor to guide or monitor treatment for MRSA infections.     Coagulation Studies: No results for input(s): LABPROT, INR in the last 72 hours.  Urinalysis: No results for input(s): COLORURINE, LABSPEC, [place], [place], [place], BILIRUBINUR, KETONESUR,  PROTEINUR, UROBILINOGEN, NITRITE, LEUKOCYTESUR in the last 72 hours.  Invalid input(s): APPERANCEUR    Imaging: Ct Head Wo Contrast  Result Date: 11/15/2016 CLINICAL DATA:  Altered level of consciousness. EXAM: CT HEAD WITHOUT CONTRAST TECHNIQUE: Contiguous axial images were obtained from the base of the skull through the vertex without intravenous contrast. COMPARISON:  04/25/2013 FINDINGS: Brain: There is atrophy and chronic small vessel disease changes. Bilateral basal ganglia and thalamic lacunar infarcts, stable. Old right occipital infarct. No acute intracranial abnormality. Specifically, no hemorrhage, hydrocephalus, mass lesion, acute infarction, or significant intracranial injury. Vascular: No hyperdense vessel or unexpected calcification. Skull: No acute calvarial abnormality. Sinuses/Orbits: Visualized paranasal sinuses and mastoids clear. Orbital soft tissues unremarkable. Other: None IMPRESSION: No acute intracranial abnormality. Atrophy,  chronic microvascular disease. 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 Electronically Signed   By: Rolm Baptise M.D.   On: 11/15/2016 09:15   Dg Chest Portable 1 View  Result Date: 11/15/2016 CLINICAL DATA:  Patient found unresponsive. Hypertension. Renal failure. EXAM: PORTABLE CHEST 1 VIEW COMPARISON:  September 24, 2016 FINDINGS: There is no edema or consolidation. Heart is borderline prominent with pulmonary vascularity within normal limits. No adenopathy. There is aortic atherosclerosis. There is a brachials stent on the left. There is degenerative change in each shoulder. IMPRESSION: Heart borderline prominent. Aortic atherosclerosis. No edema or consolidation. Aortic Atherosclerosis (ICD10-I70.0). Electronically Signed   By: Lowella Grip III M.D.   On: 11/15/2016 09:07     Medications:   . ceFEPime (MAXIPIME) IV 1 g (11/16/16 1038)  . levETIRAcetam Stopped (11/15/16 1420)  . vancomycin    . vancomycin     . chlorhexidine  15 mL Mouth Rinse BID  . heparin subcutaneous  5,000 Units Subcutaneous Q8H  . insulin aspart  0-15 Units Subcutaneous TID WC  . insulin aspart  0-5 Units Subcutaneous QHS  . mouth rinse  15 mL Mouth Rinse BID   [DISCONTINUED] ondansetron **OR** ondansetron (ZOFRAN) IV, vancomycin  Assessment/ Plan:  83 y.o. African [place] male with end-stage renal disease on hemodialysis, hypertension, diabetes type 2, insulin-dependent, seizure disorder, peripheral neuropathy, vascular dementia  MWF [facility] Left arm AVF  1. End Stage Renal Disease: MWF.   - patient currently hypotensive and critically ill - dialysis planned for Sunday AM  2. AOCKD - EPO with HD  3. SHPTH - monitor phos  4. Hypotension - work up for sepsis in progress       LOS: 1 Alfonza Toft 8/4/201811:30 AM  [facility] [place], [place]

## 2016-11-16 NOTE — Progress Notes (Signed)
Pt. Refused 0400 CBG check, CBG charted under patient was an error that is being manually corrected by lab.

## 2016-11-16 NOTE — Progress Notes (Signed)
His blood pressure is stable off of vasopressors. I have placed order for transfer to [place]. Further management per primary team, nephrology, ID service. After transfer, PCCM will sign off. Please call if we can be of further assistance    Merton Border, MD PCCM service Mobile [phone] Pager [phone] 11/16/2016 12:28 PM

## 2016-11-16 NOTE — Progress Notes (Signed)
[place] at [place] NAME: Michael Acosta    MR#:  [id number]  DATE OF BIRTH:  [birth year]  SUBJECTIVE:  CHIEF COMPLAINT:   Chief Complaint  Patient presents with  . unresponsive   The patient is awake and alert but demented. REVIEW OF SYSTEMS:  Review of Systems  Unable to perform ROS: Dementia    DRUG ALLERGIES:   Allergies  Allergen Reactions  . Aspirin Other (See Comments)    Bleeding   . Bc Powder [Aspirin-Salicylamide-Caffeine]     MAR did not specify a reaction  . Caffeine     MAR did not specify a reaction  . 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  . Ramipril Other (See Comments)    Imported from [facility] - reaction: unknown   VITALS:  Blood pressure 126/61, pulse (!) 141, temperature 97.8 F (36.6 C), temperature source Oral, resp. rate 17, height 5\' 8"  (1.727 m), weight 156 lb 8.4 oz (71 kg), SpO2 90 %. PHYSICAL EXAMINATION:  Physical Exam  Constitutional: He is oriented to person, place, and time and well-developed, well-nourished, and in no distress.  HENT:  Head: Normocephalic.  Mouth/Throat: Oropharynx is clear and moist.  Eyes: Pupils are equal, round, and reactive to light. Conjunctivae and EOM are normal. No scleral icterus.  Neck: Normal range of motion. Neck supple. No JVD present. No tracheal deviation present.  Cardiovascular: Normal rate, regular rhythm and normal heart sounds.  Exam reveals no gallop.   No murmur heard. Pulmonary/Chest:  Effort normal and breath sounds normal. No respiratory distress. He has no wheezes. He has no rales.  Abdominal: Soft. Bowel sounds are normal. He exhibits no distension. There is no tenderness. There is no rebound.  Musculoskeletal: Normal range of motion. He exhibits no edema or tenderness.  Neurological: He is alert and oriented to person, place, and time. No cranial nerve deficit.  Skin: No rash noted. No erythema.  Psychiatric:  Demented.   LABORATORY PANEL:  Male CBC  Recent Labs Lab 11/16/16 0048  WBC 4.6  HGB 10.0*  HCT 30.3*  PLT 187   ------------------------------------------------------------------------------------------------------------------ Chemistries   Recent Labs Lab 11/16/16 0048  NA 139  K 4.3  CL 105  CO2 22  GLUCOSE 78  BUN 60*  CREATININE 7.30*  CALCIUM 8.4*  MG 2.0  AST 20  ALT 13*  ALKPHOS 91  BILITOT 0.5   RADIOLOGY:  No results found. ASSESSMENT AND PLAN:   Thierno Hun  is a 83 y.o. male with a known history of End-stage renal disease, on hemodialysis on Monday, Wednesday and Friday, dementia, insulin dependent diabetes mellitus, history of seizures multiple other medical problems is brought into the ED for being unresponsive from [facility].  #Unresponsive, acute with tapering encephalopathy secondary to sepsis and hypotension. The patient mental status improved. Hypotension improved. Continue cefepime and vancomycin, follow-up blood culture. Chest x-rays negative CT head is negative Patient has received 1 L fluid bolus, off  Vasopressors. Resume midodrine.  #History of seizures No recent episodes of seizures,No witnessed  seizure in the past 24 hours Continue Keppra and resume Lamictal.  #Insulin-dependent diabetes mellitus Continue Levemir 15 units at at bedtime and sliding scale.  #End stage renal disease on hemodialysis Patient gets hemodialysis on Monday, Wednesday and Friday, last dialysis was on  Wednesday Consult nephrology  #Hyperlipidemia, resume statin.  All the records are reviewed and case discussed with Care Management/Social Worker. Management plans discussed with the patient, family and they are in agreement.  CODE STATUS: DNR  TOTAL TIME TAKING CARE OF THIS PATIENT: 37 minutes.   More than 50% of the time was spent in counseling/coordination of care: YES  POSSIBLE D/C IN 2 DAYS, DEPENDING ON CLINICAL CONDITION.   Demetrios Loll M.D on 11/16/2016 at 1:52 PM  Between 7am to 6pm - Pager - [phone]  After 6pm go to [email or web address] - Proofreader  [facility]  Office  [phone]  CC: Primary care physician; Juluis Pitch, MD  Note: This dictation was prepared with Dragon dictation along with smaller phrase technology. Any transcriptional errors that result from this process are unintentional.

## 2016-11-16 NOTE — Progress Notes (Signed)
Pt.'s neo gtt off since 0345 (see MAR for details), VSS. Bipap QHS, pt. Tolerated at 25% since 2200. Pt. A & O x 1-2, spontaneous eye opening-following commands. HD planned for today. Report given to [facility].

## 2016-11-17 LAB — BASIC METABOLIC PANEL
ANION GAP: 11 (ref 5–15)
BUN: 81 mg/dL — ABNORMAL HIGH (ref 6–20)
CALCIUM: 8.1 mg/dL — AB (ref 8.9–10.3)
CO2: 23 mmol/L (ref 22–32)
Chloride: 104 mmol/L (ref 101–111)
Creatinine, Ser: 8.92 mg/dL — ABNORMAL HIGH (ref 0.61–1.24)
GFR calc non Af Amer: 5 mL/min — ABNORMAL LOW (ref >60)
GFR, EST AFRICAN AMERICAN: 5 mL/min — AB (ref >60)
Glucose, Bld: 116 mg/dL — ABNORMAL HIGH (ref 65–99)
POTASSIUM: 4.6 mmol/L (ref 3.5–5.1)
Sodium: 138 mmol/L (ref 135–145)

## 2016-11-17 LAB — GLUCOSE, CAPILLARY
GLUCOSE-CAPILLARY: 165 mg/dL — AB (ref 65–99)
Glucose-Capillary: 80 mg/dL (ref 65–99)
Glucose-Capillary: 65 mg/dL (ref 65–99)
Glucose-Capillary: 78 mg/dL (ref 65–99)

## 2016-11-17 LAB — HEMOGLOBIN A1C
Hgb A1c MFr Bld: 6.1 % — ABNORMAL HIGH (ref 4.8–5.6)
MEAN PLASMA GLUCOSE: 128 mg/dL

## 2016-11-17 LAB — PHOSPHORUS: Phosphorus: 5 mg/dL — ABNORMAL HIGH (ref 2.5–4.6)

## 2016-11-17 MED ORDER — VANCOMYCIN HCL IN DEXTROSE 750-5 MG/150ML-% IV SOLN
750.0000 mg | INTRAVENOUS | Status: DC
  Administered 2016-11-18: 750 mg via INTRAVENOUS
  Filled 2016-11-17 (×2): qty 150

## 2016-11-17 MED ORDER — VANCOMYCIN HCL IN DEXTROSE 750-5 MG/150ML-% IV SOLN
750.0000 mg | Freq: Once | INTRAVENOUS | Status: AC
  Administered 2016-11-17: 750 mg via INTRAVENOUS
  Filled 2016-11-17: qty 150

## 2016-11-17 NOTE — Progress Notes (Signed)
[place] at [place] NAME: Michael Acosta    MR#:  [id number]  DATE OF BIRTH:  1929/06/24  SUBJECTIVE:  CHIEF COMPLAINT:   Chief Complaint  Patient presents with  . unresponsive   The patient is awake and alert, Has no complaints. REVIEW OF SYSTEMS:  Review of Systems  Constitutional: Negative for chills, fever and malaise/fatigue.  HENT: Negative for sore throat.   Eyes: Negative for blurred vision and double vision.  Respiratory: Negative for cough, hemoptysis, shortness of breath, wheezing and stridor.   Cardiovascular: Negative for chest pain, palpitations, orthopnea and leg swelling.  Gastrointestinal: Negative for abdominal pain, blood in stool, diarrhea, melena, nausea and vomiting.  Genitourinary: Negative for dysuria, flank pain and hematuria.  Musculoskeletal: Negative for back pain and joint pain.  Neurological: Negative for dizziness, sensory change, focal weakness, seizures, loss of consciousness, weakness and headaches.  Endo/Heme/Allergies: Negative for polydipsia.  Psychiatric/Behavioral: Negative for depression. The patient is not nervous/anxious.     DRUG ALLERGIES:   Allergies  Allergen Reactions  . Aspirin Other (See Comments)    Bleeding   . Bc Powder [Aspirin-Salicylamide-Caffeine]     MAR did not specify a reaction  . Caffeine     MAR did not specify a reaction  . 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  . Ramipril Other (See Comments)   Imported from [facility] - reaction: unknown   VITALS:  Blood pressure (!) 120/59, pulse 74, temperature 98.2 F (36.8 C), temperature source Oral, resp. rate 17, height 5\' 8"  (1.727 m), weight 159 lb 6.3 oz (72.3 kg), SpO2 95 %. PHYSICAL EXAMINATION:  Physical Exam  Constitutional: He is oriented to person, place, and time and well-developed, well-nourished, and in no distress.  HENT:  Head: Normocephalic.  Mouth/Throat: Oropharynx is clear and moist.  Eyes: Pupils are equal, round, and reactive to light. Conjunctivae and EOM are normal. No scleral icterus.  Neck: Normal range of motion. Neck supple. No JVD present. No tracheal deviation present.  Cardiovascular: Normal rate, regular rhythm and normal heart sounds.  Exam reveals no gallop.   No murmur heard. Pulmonary/Chest: Effort normal and breath sounds normal. No respiratory distress. He has no wheezes. He has no rales.  Abdominal: Soft. Bowel sounds are normal. He exhibits no distension. There is no tenderness. There is no rebound.  Musculoskeletal: Normal range of motion. He exhibits no edema or tenderness.  Neurological: He is alert and oriented to person, place, and time. No cranial nerve deficit.  Skin: No rash noted. No erythema.  Psychiatric:  Demented.   LABORATORY PANEL:  Male CBC  Recent Labs Lab 11/16/16 0048  WBC 4.6  HGB 10.0*  HCT 30.3*  PLT 187   ------------------------------------------------------------------------------------------------------------------ Chemistries   Recent Labs Lab 11/16/16 0048 11/17/16 0414  NA 139 138  K 4.3 4.6  CL 105 104  CO2 22 23  GLUCOSE 78 116*  BUN 60* 81*  CREATININE 7.30* 8.92*  CALCIUM 8.4* 8.1*  MG 2.0  --   AST 20  --  ALT 13*  --   ALKPHOS 91  --   BILITOT 0.5  --    RADIOLOGY:  No results found. ASSESSMENT AND PLAN:   Michael Acosta  is a 83 y.o. male with a known history of End-stage renal disease, on hemodialysis on Monday, Wednesday and Friday,  dementia, insulin dependent diabetes mellitus, history of seizures multiple other medical problems is brought into the ED for being unresponsive from [facility].  #Unresponsive, acute metabolic encephalopathy secondary to sepsis and hypotension. The patient mental status improved. Hypotension improved. Continue cefepime and vancomycin, follow-up blood culture: Negative so far. Chest x-rays negative CT head is negative Patient has received 1 L fluid bolus, off  Vasopressors. Resumed midodrine.  #History of seizures No recent episodes of seizures,No witnessed seizure in the past 24 hours Continue Keppra and resume Lamictal.  #Insulin-dependent diabetes mellitus Continue Levemir 15 units at at bedtime and sliding scale.  #End stage renal disease on hemodialysis Patient gets hemodialysis on Monday, Wednesday and Friday, last dialysis was on Wednesday. Continue hemodialysis as scheduled in today.  #Hyperlipidemia, resumed statin.  All the records are reviewed and case discussed with Care Management/Social Worker. Management plans discussed with the patient, family and they are in agreement.  CODE STATUS: DNR  TOTAL TIME TAKING CARE OF THIS PATIENT: 32 minutes.   More than 50% of the time was spent in counseling/coordination of care: YES  POSSIBLE D/C IN 2 DAYS, DEPENDING ON CLINICAL CONDITION.   Demetrios Loll M.D on 11/17/2016 at 2:32 PM  Between 7am to 6pm - Pager - [phone]  After 6pm go to [email or web address] - Proofreader  [facility] [place]  Office  [phone]  CC: Primary care physician; Juluis Pitch, MD  Note: This dictation was prepared with Dragon dictation along with smaller phrase technology. Any transcriptional errors that result from this process are unintentional.

## 2016-11-17 NOTE — Progress Notes (Signed)
Pharmacy Antibiotic Note  Michael Acosta is a 83 y.o. male with a h/o ESRD on HD admitted on 8/3/2018with sepsis.  Pharmacy has been consulted for vancomycin and cefepime dosing.  From [facility].  Plan: Vancomycin 1500 mg iv once then 750 mg IV qHD. Will need to follow plans for HD schedule and check a level prior to the third dialysis session for a goal of 15-25 mcg/ml.   Cefepime 1 g iv q 24 hours.  8/5:  HD today. Will order Vancomyin 750mg  IV x 1 w/ HD. Per Nephrology note plan HD again tomorrow 8/6 and then get back on schedule of MWF. Will order Vanc 750mg  QHD - MWF. TRough prior to 3rd HD (Probably 8/8- Wednesday)    Height: 5\' 8"  (172.7 cm) Weight: 159 lb 6.3 oz (72.3 kg) IBW/kg (Calculated) : 68.4  Temp (24hrs), Avg:97.8 F (36.6 C), Min:97.4 F (36.3 C), Max:98.2 F (36.8 C)   Recent Labs Lab 11/15/16 0932 11/15/16 1145 11/16/16 0048 11/17/16 0414  WBC 6.5  --  4.6  --   CREATININE 7.09*  --  7.30* 8.92*  LATICACIDVEN  --  0.5  --   --     Estimated Creatinine Clearance: 5.9 mL/min (A) (by C-G formula based on SCr of 8.92 mg/dL (H)).    Allergies  Allergen Reactions  . Aspirin Other (See Comments)    Bleeding   . Bc Powder [Aspirin-Salicylamide-Caffeine]     MAR did not specify a reaction  . Caffeine     MAR did not specify a reaction  . 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  . Ramipril Other (See Comments)    Imported from [facility] - reaction: unknown    Antimicrobials this  admission: 8/3 Cefepime >>  8/3 vancomycin >>   Dose adjustments this admission:   Microbiology results: 8/3 BCx: NG x 2d 8/3 UCx: sent  8/3 MRSA PCR: negative  Thank you for allowing pharmacy to be a part of this patient's care.  Amanda Steuart A 11/17/2016 11:24 AM

## 2016-11-17 NOTE — Progress Notes (Signed)
PRE DIALYSIS ASSESSMENT 

## 2016-11-17 NOTE — Progress Notes (Signed)
PT Cancellation Note  Patient Details Name: Michael Acosta MRN: [id number] DOB: [birth year]   Cancelled Treatment:    Reason Eval/Treat Not Completed: Other (comment) Consult received and chart reviewed. Currently unable to evaluate/treat, as pt continued to refuse, saying it is too early and he is tired. Will re-attempt, time permitting.     Bevelyn Ngo 11/17/2016, 8:05 AM

## 2016-11-17 NOTE — Progress Notes (Signed)
Pt went to HD today and tolerated it well, , upon return cheerful and resting well. Wife called and was updated

## 2016-11-17 NOTE — Progress Notes (Signed)
This note also relates to the following rows which could not be included: Pulse Rate - Cannot attach notes to unvalidated device data Resp - Cannot attach notes to unvalidated device data BP - Cannot attach notes to unvalidated device data  HD COMPLETED  

## 2016-11-17 NOTE — Progress Notes (Signed)
This note also relates to the following rows which could not be included: Pulse Rate - Cannot attach notes to unvalidated device data Resp - Cannot attach notes to unvalidated device data BP - Cannot attach notes to unvalidated device data  HD STARTED  

## 2016-11-17 NOTE — Progress Notes (Signed)
[facility], [place] 11/17/16  Subjective:   Patient known to our practice from previous admissions Presents from [facility] when found unresponsive. EMS noted hypotension. Brought to ER for evaluation At present he is doing better. Able to eat without nausea or vomiting No SOB Able to follow commands  Objective:  Vital signs in last 24 hours:  Temp:  [97.4 F (36.3 C)-97.8 F (36.6 C)] 97.6 F (36.4 C) (08/05 0448) Pulse Rate:  [66-141] 67 (08/05 0507) Resp:  [14-20] 20 (08/05 0448) BP: (105-139)/(36-61) 137/42 (08/05 0507) SpO2:  [90 %-100 %] 95 % (08/05 0448)  Weight change:  Filed Weights   11/15/16 1055 11/15/16 1215  Weight: 54.2 kg (119 lb 7.8 oz) 71 kg (156 lb 8.4 oz)    Intake/Output:    Intake/Output Summary (Last 24 hours) at 11/17/16 1034 Last data filed at 11/17/16 0900  Gross per 24 hour  Intake              350 ml  Output                0 ml  Net              350 ml     Physical Exam: General: Chronically ill appearing  HEENT Anicteric,    Neck No masses  Pulm/lungs Clear b/l  CVS/Heart no rub  Abdomen:  Soft, NT  Extremities: No edema  Neurologic: Alert, able to follow few basic commands  Skin: No acute rashes  Access: Left upper arm AVF       Basic Metabolic Panel:   Recent Labs Lab 11/15/16 0932 11/16/16 0048 11/17/16 0414  NA 139 139 138  K 3.8 4.3 4.6  CL 100* 105 104  CO2 25 22 23   GLUCOSE 94 78 116*  BUN 58* 60* 81*  CREATININE 7.09* 7.30* 8.92*  CALCIUM 8.9 8.4* 8.1*  MG  --  2.0  --   PHOS  --  4.8*  --      CBC:  Recent Labs Lab 11/15/16 0932 11/16/16 0048  WBC 6.5 4.6  NEUTROABS 5.1  --   HGB 10.2* 10.0*  HCT 30.6* 30.3*  MCV 95.2 96.6  PLT 194 187      Lab Results  Component Value Date   HEPBSAG Negative 09/25/2016   HEPBSAB Non Reactive 09/25/2016   HEPBIGM Negative 09/24/2016      Microbiology:  Recent Results (from the past 240 hour(s))  Blood culture  (routine x 2)     Status: None (Preliminary result)   Collection Time: 11/15/16 11:45 AM  Result Value Ref Range Status   Specimen Description BLOOD BLOOD RIGHT WRIST  Final   Special Requests   Final    BOTTLES DRAWN AEROBIC AND ANAEROBIC Blood Culture adequate volume   Culture NO GROWTH 2 DAYS  Final   Report Status PENDING  Incomplete  MRSA PCR Screening     Status: None   Collection Time: 11/15/16 12:38 PM  Result Value Ref Range Status   MRSA by PCR NEGATIVE NEGATIVE Final    Comment:        The GeneXpert MRSA Assay (FDA approved for NASAL specimens only), is one component of a comprehensive MRSA colonization surveillance program. It is not intended to diagnose MRSA infection nor to guide or monitor treatment for MRSA infections.     Coagulation Studies: No results for input(s): LABPROT, INR in the last 72 hours.  Urinalysis: No results for input(s): COLORURINE,  LABSPEC, PHURINE, GLUCOSEU, HGBUR, BILIRUBINUR, KETONESUR, PROTEINUR, UROBILINOGEN, NITRITE, LEUKOCYTESUR in the last 72 hours.  Invalid input(s): APPERANCEUR    Imaging: No results found.   Medications:   . sodium chloride    . ceFEPime (MAXIPIME) IV Stopped (11/16/16 1202)  . vancomycin     . atorvastatin  20 mg Oral QHS  . chlorhexidine  15 mL Mouth Rinse BID  . gabapentin  100 mg Oral QHS  . heparin subcutaneous  5,000 Units Subcutaneous Q8H  . insulin aspart  0-15 Units Subcutaneous TID WC  . insulin aspart  0-5 Units Subcutaneous QHS  . lamoTRIgine  50 mg Oral Daily   And  . lamoTRIgine  100 mg Oral QHS  . levETIRAcetam  500 mg Oral Daily  . mouth rinse  15 mL Mouth Rinse BID  . midodrine  5 mg Oral TID WC  . pantoprazole  40 mg Oral QAC breakfast  . sevelamer carbonate  800 mg Oral TID WC   sodium chloride, docusate sodium, [DISCONTINUED] ondansetron **OR** ondansetron (ZOFRAN) IV, vancomycin  Assessment/ Plan:  83 y.o. African [place] male with end-stage renal disease on  hemodialysis, hypertension, diabetes type 2, insulin-dependent, seizure disorder, peripheral neuropathy, vascular dementia  MWF [facility] Left arm AVF  1. End Stage Renal Disease: MWF.   - patient currently hypotensive and critically ill - dialysis planned for Sunday AM and then Monday to get him back on routine schedule  2. [facility] - EPO with HD  3. [facility] - monitor phos  4. Sepsis, hypotension -  Blood cultures positive for E Coli in June  - treated with cefepime and Vanc     LOS: 2 Seriyah Collison 8/5/201810:34 AM  [facility] [place], [place]

## 2016-11-17 NOTE — Clinical Social Work Note (Signed)
Clinical Social Work Assessment  Patient Details  Name: Michael Acosta MRN: [id number] Date of Birth: February 06, 1930  Date of referral:  11/17/16               Reason for consult:  Facility Placement                Permission sought to share information with:  Facility Art therapist granted to share information::  Yes, Verbal Permission Granted  Name::        Agency::  [facility]  Relationship::     Contact Information:     Housing/Transportation Living arrangements for the past 2 months:  [place] of Information:  Facility, Medical Team Patient Interpreter Needed:  None Criminal Activity/Legal Involvement Pertinent to Current Situation/Hospitalization:  No - Comment as needed Significant Relationships:  Adult Children Lives with:  Facility Resident Do you feel safe going back to the place where you live?  Yes Need for family participation in patient care:  No (Coment)  Care giving concerns:  Patient admitted from [facility]   Social Worker assessment / plan:  CSW attempted to contact the patient's family with no success. The patient was significantly lethargic and has a history of dementia; therefore, the patient could not participate in the assessment at this time. The CSW spoke with the admissions coordinator at [facility] who confirmed the patient is from [facility] for LTC and can return when stable. The patient has dialysis MWF, and he was found unresponsive. Currently, the patient is code Sepsis and is improving. The patient will most likely discharge on Tuesday. [facility] is aware.  Employment status:  Retired Forensic scientist:  Medicare PT Recommendations:  Not assessed at this time Information / Referral to community resources:     Patient/Family's Response to care:  The patient was lethargic, and the family was unavailable.  Patient/Family's Understanding of and Emotional Response to Diagnosis, Current Treatment, and Prognosis:  The  patient was lethargic, and the family was unavailable.   Emotional Assessment Appearance:  Appears stated age Attitude/Demeanor/Rapport:   (Lethargic) Affect (typically observed):  Appropriate Orientation:  Oriented to Self Alcohol / Substance use:  Never Used Psych involvement (Current and /or in the community):  No (Comment)  Discharge Needs  Concerns to be addressed:  Care Coordination, Discharge Planning Concerns Readmission within the last 30 days:  No Current discharge risk:  Chronically ill Barriers to Discharge:  Continued Medical Work up   [employer], LCSW 11/17/2016, 4:19 PM

## 2016-11-17 NOTE — NC FL2 (Signed)
[place] LEVEL OF CARE SCREENING TOOL     IDENTIFICATION  Patient Name: Michael Acosta Birthdate: 1930-01-06 Sex: male Admission Date (Current Location): 11/15/2016  [place] and [place] Number:  Engineering geologist and Address:  [facility], [address], [place], [place] [address]      Provider Number: [id number]  Attending Physician Name and Address:  Demetrios Loll, MD  Relative Name and Phone Number:  Cleatus Gabriel (Spouse) [phone]    Current Level of Care: Hospital Recommended Level of Care: [place] Prior Approval Number:    Date Approved/Denied: 04/29/13 PASRR Number: [id number]  Discharge Plan: SNF    Current Diagnoses: Patient Active Problem List   Diagnosis Date Noted  . Acute encephalopathy   . Atherosclerosis of native arteries of the extremities with gangrene ([place]) 11/05/2016  . Acute gallstone pancreatitis 10/04/2016  . Weakness generalized   . Cholecystitis 09/25/2016  . Elevated troponin 09/25/2016  . ESRD on hemodialysis ([place]) 09/25/2016  . Dementia 09/25/2016  . PVD (peripheral vascular disease) ([place]) 09/25/2016  . IDDM (insulin dependent diabetes mellitus) ([place]) 09/25/2016  . Bacteremia 09/25/2016  . DNR (do not resuscitate) discussion 09/25/2016  . Palliative care by specialist 09/25/2016  . Sepsis ([place])   . Septic shock ([place]) 09/24/2016  . CAP (community acquired pneumonia) 04/13/2016    Orientation RESPIRATION BLADDER Height & Weight     Self  Normal Incontinent Weight: 159 lb 6.3 oz (72.3 kg) Height:  5\' 8"  (172.7 cm)  BEHAVIORAL SYMPTOMS/MOOD NEUROLOGICAL BOWEL NUTRITION STATUS    Convulsions/Seizures Incontinent Diet (Renal diet)  AMBULATORY STATUS COMMUNICATION OF NEEDS Skin   Extensive Assist Verbally Normal                       Personal Care Assistance Level of Assistance  Bathing, Feeding, Dressing Bathing Assistance: Maximum assistance Feeding assistance:  Limited assistance Dressing Assistance: Maximum assistance     Functional Limitations Info             SPECIAL CARE FACTORS FREQUENCY                       Contractures Contractures Info: Not present    Additional Factors Info  Code Status, Allergies, Psychotropic Code Status Info: DNR Allergies Info: Aspirin, Bc Powder Aspirin-salicylamide-caffeine, Caffeine, Penicillins, Ramipril Psychotropic Info: Lamictal, Keppra (for seizure disorder)         Current Medications (11/17/2016):  This is the current hospital active medication list Current Facility-Administered Medications  Medication Dose Route Frequency Provider Last Rate Last Dose  . 0.9 %  sodium chloride infusion   Intravenous PRN Demetrios Loll, MD      . atorvastatin (LIPITOR) tablet 20 mg  20 mg Oral QHS Demetrios Loll, MD   20 mg at 11/16/16 2155  . ceFEPIme (MAXIPIME) 1 g in dextrose 5 % 50 mL IVPB  1 g Intravenous Q24H Napoleon Form, RPH 100 mL/hr at 11/17/16 1527 1 g at 11/17/16 1527  . chlorhexidine (PERIDEX) 0.12 % solution 15 mL  15 mL Mouth Rinse BID Tukov, Magadalene S, NP   15 mL at 11/16/16 0000  . docusate sodium (COLACE) capsule 100 mg  100 mg Oral BID PRN Demetrios Loll, MD      . gabapentin (NEURONTIN) capsule 100 mg  100 mg Oral QHS Demetrios Loll, MD   100 mg at 11/16/16 2155  . heparin injection 5,000 Units  5,000 Units Subcutaneous Q8H  Nicholes Mango, MD   5,000 Units at 11/17/16 1531  . insulin aspart (novoLOG) injection 0-15 Units  0-15 Units Subcutaneous TID WC Wilhelmina Mcardle, MD   3 Units at 11/16/16 1712  . insulin aspart (novoLOG) injection 0-5 Units  0-5 Units Subcutaneous QHS Wilhelmina Mcardle, MD      . lamoTRIgine (LAMICTAL) tablet 50 mg  50 mg Oral Daily Demetrios Loll, MD   50 mg at 11/17/16 1532   And  . lamoTRIgine (LAMICTAL) tablet 100 mg  100 mg Oral QHS Demetrios Loll, MD   100 mg at 11/16/16 2155  . levETIRAcetam (KEPPRA) tablet 500 mg  500 mg Oral Daily Demetrios Loll, MD   500 mg at 11/17/16 1546  .  MEDLINE mouth rinse  15 mL Mouth Rinse BID Dorene Sorrow S, NP   15 mL at 11/15/16 2200  . midodrine (PROAMATINE) tablet 5 mg  5 mg Oral TID WC Demetrios Loll, MD   5 mg at 11/17/16 0901  . ondansetron (ZOFRAN) injection 4 mg  4 mg Intravenous Q6H PRN Gouru, Aruna, MD      . pantoprazole (PROTONIX) EC tablet 40 mg  40 mg Oral QAC breakfast Demetrios Loll, MD   40 mg at 11/17/16 1533  . sevelamer carbonate (RENVELA) tablet 800 mg  800 mg Oral TID WC Demetrios Loll, MD   800 mg at 11/16/16 1713  . [START ON 11/18/2016] vancomycin (VANCOCIN) IVPB 750 mg/150 ml premix  750 mg Intravenous Q M,W,F-HD Gouru, Aruna, MD         Discharge Medications: Please see discharge summary for a list of discharge medications.  Relevant Imaging Results:  Relevant Lab Results:   Additional Information SS# [id number]  Zettie Pho, LCSW

## 2016-11-17 NOTE — Progress Notes (Signed)
Hypoglycemic Event  CBG: 65   Treatment: Fat free milk 8oz   Symptoms: None  Follow-up CBG: Time: 2123  CBG Result: 78  Possible Reasons for Event: Unknown  Comments/MD notified: Dr. Orland Penman, Conan Bowens

## 2016-11-18 DIAGNOSIS — K921 Melena: Secondary | ICD-10-CM

## 2016-11-18 LAB — GLUCOSE, CAPILLARY
GLUCOSE-CAPILLARY: 109 mg/dL — AB (ref 65–99)
Glucose-Capillary: 70 mg/dL (ref 65–99)
Glucose-Capillary: 147 mg/dL — ABNORMAL HIGH (ref 65–99)
Glucose-Capillary: 132 mg/dL — ABNORMAL HIGH (ref 65–99)

## 2016-11-18 LAB — CBC
HEMATOCRIT: 28.3 % — AB (ref 40.0–52.0)
Hemoglobin: 9.4 g/dL — ABNORMAL LOW (ref 13.0–18.0)
MCH: 31.8 pg (ref 26.0–34.0)
MCHC: 33.2 g/dL (ref 32.0–36.0)
MCV: 95.9 fL (ref 80.0–100.0)
Platelets: 187 10*3/uL (ref 150–440)
RBC: 2.95 MIL/uL — ABNORMAL LOW (ref 4.40–5.90)
RDW: 14.8 % — ABNORMAL HIGH (ref 11.5–14.5)
WBC: 4.7 10*3/uL (ref 3.8–10.6)

## 2016-11-18 LAB — RENAL FUNCTION PANEL
ALBUMIN: 3.1 g/dL — AB (ref 3.5–5.0)
Anion gap: 11 (ref 5–15)
BUN: 38 mg/dL — AB (ref 6–20)
CALCIUM: 8.3 mg/dL — AB (ref 8.9–10.3)
CO2: 27 mmol/L (ref 22–32)
Chloride: 100 mmol/L — ABNORMAL LOW (ref 101–111)
Creatinine, Ser: 5.49 mg/dL — ABNORMAL HIGH (ref 0.61–1.24)
GFR calc Af Amer: 10 mL/min — ABNORMAL LOW (ref >60)
GFR calc non Af Amer: 8 mL/min — ABNORMAL LOW (ref >60)
GLUCOSE: 143 mg/dL — AB (ref 65–99)
PHOSPHORUS: 2.7 mg/dL (ref 2.5–4.6)
Potassium: 3.8 mmol/L (ref 3.5–5.1)
SODIUM: 138 mmol/L (ref 135–145)

## 2016-11-18 MED ORDER — OXYCODONE-ACETAMINOPHEN 5-325 MG PO TABS: 1.0000 | ORAL_TABLET | Freq: Four times a day (QID) | ORAL | Status: DC | PRN

## 2016-11-18 MED ORDER — EPOETIN ALFA 10000 UNIT/ML IJ SOLN
10000.0000 [IU] | INTRAMUSCULAR | Status: DC
  Administered 2016-11-18: 10000 [IU] via INTRAVENOUS

## 2016-11-18 MED ORDER — ACETAMINOPHEN 325 MG PO TABS: 650.0000 mg | ORAL_TABLET | Freq: Four times a day (QID) | ORAL | Status: DC | PRN

## 2016-11-18 MED ORDER — PANTOPRAZOLE SODIUM 40 MG IV SOLR
40.0000 mg | Freq: Two times a day (BID) | INTRAVENOUS | Status: DC
  Administered 2016-11-18 – 2016-11-19 (×2): 40 mg via INTRAVENOUS
  Filled 2016-11-18 (×2): qty 40

## 2016-11-18 MED ORDER — INSULIN ASPART 100 UNIT/ML ~~LOC~~ SOLN
0.0000 [IU] | Freq: Three times a day (TID) | SUBCUTANEOUS | Status: DC
  Administered 2016-11-19: 2 [IU] via SUBCUTANEOUS
  Filled 2016-11-18 (×2): qty 1

## 2016-11-18 NOTE — Progress Notes (Signed)
Post HD assessment unchanged  

## 2016-11-18 NOTE — Progress Notes (Signed)
Pre HD  

## 2016-11-18 NOTE — Progress Notes (Signed)
Inpatient Diabetes Program Recommendations  AACE/ADA: New Consensus Statement on Inpatient Glycemic Control (2015)  Target Ranges:  Prepandial:   less than 140 mg/dL      [facility] postprandial:   less than 180 mg/dL (1-2 hours)      Critically ill patients:  140 - 180 mg/dL   Results for LOWEN, BARRINGER (MRN [id number]) as of 11/18/2016 08:22  Ref. Range 11/17/2016 08:01 11/17/2016 16:52 11/17/2016 21:08 11/17/2016 21:24 11/18/2016 07:55  Glucose-Capillary Latest Ref Range: 65 - 99 mg/dL 80 165 (H) 65 78 70  Results for HONEST, VANLEER (MRN [id number]) as of 11/18/2016 08:22  Ref. Range 11/16/2016 00:48  Hemoglobin A1C Latest Ref Range: 4.8 - 5.6 % 6.1 (H)   Review of Glycemic Control  Outpatient DM medications: Levemir 15 units QHS, Novolog 0-10 units TID, Tradjenta 5 mg daily, Amaryl 1 mg daily Current orders for Inpatient glycemic control: Novolog 0-15 units TID with meals, Novolog 0-5 units QHS  Inpatient Diabetes Program Recommendations: Correction (SSI): Please consider decreasing Novolog correction to sensitive scale 0-9 units TID with meals. Outpatient DM medications: Patient as not required much Novolog correction (only got total of 3 units on 11/17/16) for DM control, noted to have hypoglycemia following Novolog correction on 11/17/16, and A1C 6.1% indicating an average glucose of 128 mg/dl over the past 2-3 months. At time of discharge, MD may want to consider adjusting outpatient DM medications.  Thanks, Barnie Alderman, RN, MSN, CDE Diabetes Coordinator Inpatient Diabetes Program [phone] (Team Pager from 8am to 5pm)

## 2016-11-18 NOTE — Progress Notes (Addendum)
[place] [facility] [place] NAME: Michael Acosta    MR#:  [id number]  DATE OF BIRTH:  Apr 11, 1930  SUBJECTIVE:  CHIEF COMPLAINT:   Chief Complaint  Patient presents with  . unresponsive   The patient had Bloody stool last night, and clear how much. REVIEW OF SYSTEMS:  Review of Systems  Constitutional: Negative for chills, fever and malaise/fatigue.  HENT: Negative for sore throat.   Eyes: Negative for blurred vision and double vision.  Respiratory: Negative for cough, hemoptysis, shortness of breath, wheezing and stridor.   Cardiovascular: Negative for chest pain, palpitations, orthopnea and leg swelling.  Gastrointestinal: Positive for blood in stool. Negative for abdominal pain, diarrhea, melena, nausea and vomiting.  Genitourinary: Negative for dysuria, flank pain and hematuria.  Musculoskeletal: Negative for back pain and joint pain.  Neurological: Negative for dizziness, sensory change, focal weakness, seizures, loss of consciousness, weakness and headaches.  Endo/Heme/Allergies: Negative for polydipsia.  Psychiatric/Behavioral: Negative for depression. The patient is not nervous/anxious.     DRUG ALLERGIES:   Allergies  Allergen Reactions  . Aspirin Other (See Comments)    Bleeding   . Bc Powder [Aspirin-Salicylamide-Caffeine]     MAR did not specify a reaction  . Caffeine     MAR did not specify a reaction  . 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  . Ramipril  Other (See Comments)    Imported from [facility] - reaction: unknown   VITALS:  Blood pressure 137/62, pulse 75, temperature 98.2 F (36.8 C), temperature source Oral, resp. rate 15, height 5\' 8"  (1.727 m), weight 167 lb 15.9 oz (76.2 kg), SpO2 96 %. PHYSICAL EXAMINATION:  Physical Exam  Constitutional: He is oriented to person, place, and time and well-developed, well-nourished, and in no distress.  HENT:  Head: Normocephalic.  Mouth/Throat: Oropharynx is clear and moist.  Eyes: Pupils are equal, round, and reactive to light. Conjunctivae and EOM are normal. No scleral icterus.  Neck: Normal range of motion. Neck supple. No JVD present. No tracheal deviation present.  Cardiovascular: Normal rate, regular rhythm and normal heart sounds.  Exam reveals no gallop.   No murmur heard. Pulmonary/Chest: Effort normal and breath sounds normal. No respiratory distress. He has no wheezes. He has no rales.  Abdominal: Soft. Bowel sounds are normal. He exhibits no distension. There is no tenderness. There is no rebound.  Musculoskeletal: Normal range of motion. He exhibits no edema or tenderness.  Neurological: He is alert and oriented to person, place, and time. No cranial nerve deficit.  Skin: No rash noted. No erythema.  Psychiatric:  Demented.   LABORATORY PANEL:  Male CBC  Recent Labs Lab 11/18/16 0505  WBC 4.7  HGB 9.4*  HCT 28.3*  PLT 187   ------------------------------------------------------------------------------------------------------------------ Chemistries   Recent Labs Lab 11/16/16 0048  11/18/16 1005  NA 139  < > 138  K 4.3  < > 3.8  CL 105  < > 100*  CO2 22  < > 27  GLUCOSE 78  < > 143*  BUN 60*  < >  38*  CREATININE 7.30*  < > 5.49*  CALCIUM 8.4*  < > 8.3*  MG 2.0  --   --   AST 20  --   --   ALT 13*  --   --   ALKPHOS 91  --   --   BILITOT 0.5  --   --   < > = values in this interval not displayed. RADIOLOGY:  No results found. ASSESSMENT AND PLAN:    Michael Acosta  is a 83 y.o. male with a known history of End-stage renal disease, on hemodialysis on Monday, Wednesday and Friday, dementia, insulin dependent diabetes mellitus, history of seizures multiple other medical problems is brought into the ED for being unresponsive from [facility].  #Unresponsive, acute metabolic encephalopathy secondary to sepsis and hypotension. The patient mental status improved. Hypotension improved. Discontinue cefepime and vancomycin, blood culture: Negative so far. Procalcitonin is 0.14. Chest x-rays negative CT head is negative Patient has received 1 L fluid bolus, off  Vasopressors. Resumed midodrine.  #History of seizures No recent episodes of seizures,No witnessed seizure in the past 24 hours Continue Keppra and resume Lamictal.  #Insulin-dependent diabetes mellitus, hypoglycemia last night and this am. Discontinue Levemir 15 units at at bedtime due to hypoglycemia, continue sliding scale.  #End stage renal disease on hemodialysis Patient gets hemodialysis on Monday, Wednesday and Friday. Continue hemodialysis as scheduled.  #Hyperlipidemia, resumed statin. Anemia of chronic disease. Hemoglobin is 9.4 today. GI bleeding with bloody stool. Continue Protonix. Follow-up hemoglobin and GI consult.  I discussed with Dr. Allen Norris. All the records are reviewed and case discussed with Care Management/Social Worker. Management plans discussed with the patient, family and they are in agreement.  CODE STATUS: DNR  TOTAL TIME TAKING CARE OF THIS PATIENT: 28 minutes.   More than 50% of the time was spent in counseling/coordination of care: YES  POSSIBLE D/C IN 2 DAYS, DEPENDING ON CLINICAL CONDITION.   Demetrios Loll M.D on 11/18/2016 at 2:35 PM  Between 7am to 6pm - Pager - [phone]  After 6pm go to [email or web address] - Proofreader  [facility]  Office  [phone]  CC: Primary care physician; Juluis Pitch, MD  Note: This dictation was prepared with Dragon dictation along with smaller phrase technology. Any transcriptional errors that result from this process are unintentional.

## 2016-11-18 NOTE — Consult Note (Signed)
Michael Lame, MD [facility]  [address], [place], [place] [address] Phone: [phone] Fax : [phone]  Consultation  Referring Provider:      Dr. Bridgett Acosta Primary Care Physician:  Michael Pitch, MD Primary Gastroenterologist:  Michael Acosta         Reason for Consultation:     Rectal bleeding  Date of Admission:  11/15/2016 Date of Consultation:  11/18/2016         HPI:   Michael Acosta is a 83 y.o. male who is admitted with a history of end-stage renal disease and being unresponsive.  The patient was seen today in dialysis.  It appears that the patient's hemoglobin one month ago was between 9 and 10.  The patient was then admitted to the hospital with a hemoglobin of 10.2.  The patient's hemoglobin this morning was 9.4 after he had 1 episode of rectal bleeding.  The patient denies any abdominal pain nausea vomiting fevers or chills.  The patient is now being seen for his single episode of rectal bleeding and his drop in his hemoglobin. The patient denies ever having a colonoscopy in the past and reports that he would not want to have a colonoscopy.The patient also denies ever having an episode of rectal bleeding in the past that he can recall.  Past Medical History:  Diagnosis Date  . Anemia of chronic disease   . Chronic pain disorder   . Dementia   . Depression   . ESRD on hemodialysis ([facility])    a. MWF  . Falls frequently   . GERD (gastroesophageal reflux disease)   . Hypertension   . Insulin dependent diabetes mellitus with complications ([place])   . Mood disorder ([place])   . Peripheral neuropathy    a. diabetic neuropathy  . Peripheral vascular disease ([place])    a. s/p prior toe amputation; b. followed by [facility]  . Prostate cancer ([place])   . Seizures ([place])    EPILEPSY  . Stroke [facility])    TIA  . Weakness generalized     Past Surgical History:  Procedure Laterality Date  . COLON SURGERY     RESECTION  . INSERTION OF DIALYSIS CATHETER     SHUNT  . LEG SURGERY Left    MULTIPLE VEIN SURGERIES  . LOWER EXTREMITY ANGIOGRAPHY Right 11/07/2016   Procedure: Lower Extremity Angiography;  Surgeon: Michael Huxley, MD;  Location: [facility];  Service: Cardiovascular;  Laterality: Right;  . TOTAL HIP ARTHROPLASTY Left     Prior to Admission medications   Medication Sig Start Date End Date Taking? Authorizing Provider  atorvastatin (LIPITOR) 20 MG tablet Take 20 mg by mouth at bedtime.    Yes [provider]  docusate sodium (COLACE) 100 MG capsule Take 100 mg by mouth 2 (two) times daily as needed for mild constipation.   Yes [provider]  gabapentin (NEURONTIN) 100 MG capsule Take 100 mg by mouth at bedtime.   Yes [provider]  glimepiride (AMARYL) 1 MG tablet Take 1 mg by mouth daily. 11/13/16  Yes [provider]  HYDROcodone-acetaminophen (NORCO/VICODIN) 5-325 MG tablet Take 1 tablet by mouth every 6 (six) hours as needed for moderate pain or severe pain. 09/29/16  Yes Sainani, Belia Heman, MD  insulin aspart (NOVOLOG) 100 UNIT/ML injection Inject 0-10 Units into the skin See admin instructions. Three times a day before meals. At bedtime on Sunday, Tuesday, Thursday, Saturday only. Dose per sliding scale.   Yes  [provider]  Insulin Detemir (LEVEMIR FLEXPEN) 100 UNIT/ML Pen Inject 15 Units into the skin at bedtime.    Yes [provider]  lamoTRIgine (LAMICTAL) 100 MG tablet Take 50-100 mg by mouth 2 (two) times daily. Takes 50mg  in the AM and 100mg  at bedtime.   Yes [provider]  levETIRAcetam (KEPPRA) 500 MG tablet Take 500 mg by mouth daily.   Yes [provider]  linagliptin (TRADJENTA) 5 MG TABS tablet Take 5 mg by mouth at bedtime.    Yes [provider]  metoCLOPramide (REGLAN) 5 MG tablet Take 5 mg by mouth 3 (three) times daily before meals.    Yes [provider]  midodrine (PROAMATINE) 5 MG tablet Take 1 tablet (5 mg total) by mouth 3 (three) times daily  with meals. 04/18/16  Yes Gladstone Lighter, MD  omeprazole (PRILOSEC) 20 MG capsule Take 20 mg by mouth daily.    Yes [provider]  ondansetron (ZOFRAN) 4 MG tablet Take 4 mg by mouth every 8 (eight) hours as needed for nausea or vomiting.    Yes [provider]  promethazine (PHENERGAN) 25 MG/ML injection Inject 25 mg into the muscle every 6 (six) hours as needed for nausea or vomiting. 09/23/16 11/15/17 Yes [provider]  sevelamer carbonate (RENVELA) 800 MG tablet Take 800 mg by mouth 3 (three) times daily.    Yes [provider]  Amino Acids-Protein Hydrolys (FEEDING SUPPLEMENT, PRO-STAT SUGAR FREE 64,) LIQD Take 30 mLs by mouth 2 (two) times daily.    [provider]  sulfamethoxazole-trimethoprim (BACTRIM DS,SEPTRA DS) 800-160 MG tablet Take 1 tablet by mouth 2 (two) times daily.  10/31/16   [provider]    Family History  Problem Relation Age of Onset  . CAD Neg Hx   . Diabetes Neg Hx      Social History  Substance Use Topics  . Smoking status: Never Smoker  . Smokeless tobacco: Never Used  . Alcohol use No    Allergies as of 11/15/2016 - Review Complete 11/15/2016  Allergen Reaction Noted  . Aspirin Other (See Comments) 08/14/2014  . Bc powder [aspirin-salicylamide-caffeine]  08/14/2014  . Caffeine  11/05/2016  . Penicillins Other (See Comments) 07/01/2013  . Ramipril Other (See Comments) 09/02/2012    Review of Systems:    All systems reviewed and negative except where noted in HPI.   Physical Exam:  Vital signs in last 24 hours: Temp:  [97.4 F (36.3 C)-98.3 F (36.8 C)] 98.2 F (36.8 C) (08/06 1331) Pulse Rate:  [64-77] 75 (08/06 1331) Resp:  [14-18] 15 (08/06 1304) BP: (127-141)/(56-76) 137/62 (08/06 1331) SpO2:  [96 %-100 %] 96 % (08/06 1331) Weight:  [167 lb 15.9 oz (76.2 kg)] 167 lb 15.9 oz (76.2 kg) (08/06 1250) Last BM Date: 11/18/16 General:   Pleasant, cooperative in NAD Head:  Normocephalic  and atraumatic. Eyes:   No icterus.   Conjunctiva pink. PERRLA. Ears:  Normal auditory acuity. Neck:  Supple; no masses or thyroidomegaly Lungs: Respirations even and unlabored. Lungs clear to auscultation bilaterally.   No wheezes, crackles, or rhonchi.  Heart:  Regular rate and rhythm;  Without murmur, clicks, rubs or gallops Abdomen:  Soft, nondistended, nontender. Normal bowel sounds. No appreciable masses or hepatomegaly.  No rebound or guarding.  Rectal:  Not performed. Msk:  Symmetrical without gross deformities.   Extremities:  Without edema, cyanosis or clubbing. Dialysis fistula on left arm Neurologic:  Alert and oriented x3;  grossly normal neurologically. Skin:  Intact without significant lesions or rashes. Cervical Nodes:  No significant cervical adenopathy. Psych:  Alert and cooperative. Normal affect.  LAB RESULTS:  Recent Labs  11/16/16 0048 11/18/16 0505  WBC 4.6 4.7  HGB 10.0* 9.4*  HCT 30.3* 28.3*  PLT 187 187   BMET  Recent Labs  11/16/16 0048 11/17/16 0414 11/18/16 1005  NA 139 138 138  K 4.3 4.6 3.8  CL 105 104 100*  CO2 22 23 27   GLUCOSE 78 116* 143*  BUN 60* 81* 38*  CREATININE 7.30* 8.92* 5.49*  CALCIUM 8.4* 8.1* 8.3*   LFT  Recent Labs  11/16/16 0048 11/18/16 1005  PROT 6.6  --   ALBUMIN 3.2* 3.1*  AST 20  --   ALT 13*  --   ALKPHOS 91  --   BILITOT 0.5  --    PT/INR No results for input(s): LABPROT, INR in the last 72 hours.  STUDIES: No results found.    Impression / Plan:   Michael Acosta is a 83 y.o. y/o male with a single episode of rectal bleeding yesterday and a drop in his hemoglobin from 10-9.4.  The patient has a baseline of hemoglobin somewhere between 9 and 10 from his review as blood draws.  The patient has voiced his refusal to undergo a colonoscopy.  The patient will have his blood count monitored overnight.  Nothing further to do from a GI point of view at this time unless the patient starts bleeding again and  is convinced to undergo colonoscopy for recurrent bleeding.   Thank you for involving me in the care of this patient.      LOS: 3 days   Michael Lame, MD  11/18/2016, 5:49 PM   Note: This dictation was prepared with Dragon dictation along with smaller phrase technology. Any transcriptional errors that result from this process are unintentional.

## 2016-11-18 NOTE — Progress Notes (Signed)
Pre hd 

## 2016-11-18 NOTE — Progress Notes (Signed)
[place], [place] 11/18/16  Subjective:  Patient seen and evaluated during hemodialysis. Breathing comfortably at the moment. Tolerating dialysis treatment well.  Objective:  Vital signs in last 24 hours:  Temp:  [97.4 F (36.3 C)-98.3 F (36.8 C)] 97.4 F (36.3 C) (08/06 0940) Pulse Rate:  [64-78] 64 (08/06 1030) Resp:  [12-23] 15 (08/06 1030) BP: (100-137)/(51-119) 137/64 (08/06 1030) SpO2:  [98 %-100 %] 99 % (08/06 1030) Weight:  [76.2 kg (167 lb 15.9 oz)] 76.2 kg (167 lb 15.9 oz) (08/06 0940)  Weight change:  Filed Weights   11/15/16 1215 11/17/16 1045 11/18/16 0940  Weight: 71 kg (156 lb 8.4 oz) 72.3 kg (159 lb 6.3 oz) 76.2 kg (167 lb 15.9 oz)    Intake/Output:    Intake/Output Summary (Last 24 hours) at 11/18/16 1054 Last data filed at 11/17/16 2110  Gross per 24 hour  Intake              480 ml  Output                1 ml  Net              479 ml     Physical Exam: General: Chronically ill appearing  HEENT Anicteric, OM moist   Neck supple  Pulm/lungs Clear b/l, normal effort  CVS/Heart S1S2 no rubs  Abdomen:  Soft, NTND, BS present  Extremities: No edema  Neurologic: Alert, able to follow few basic commands  Skin: No acute rashes  Access: Left upper arm AVF       Basic Metabolic Panel:   Recent Labs Lab 11/15/16 0932 11/16/16 0048 11/17/16 0414 11/17/16 1052 11/18/16 1005  NA 139 139 138  --  138  K 3.8 4.3 4.6  --  3.8  CL 100* 105 104  --  100*  CO2 25 22 23   --  27  GLUCOSE 94 78 116*  --  143*  BUN 58* 60* 81*  --  38*  CREATININE 7.09* 7.30* 8.92*  --  5.49*  CALCIUM 8.9 8.4* 8.1*  --  8.3*  MG  --  2.0  --   --   --   PHOS  --  4.8*  --  5.0* 2.7     CBC:  Recent Labs Lab 11/15/16 0932 11/16/16 0048 11/18/16 0505  WBC 6.5 4.6 4.7  NEUTROABS 5.1  --   --   HGB 10.2* 10.0* 9.4*  HCT 30.6* 30.3* 28.3*  MCV 95.2 96.6 95.9  PLT 194 187 187      Lab Results  Component Value Date   HEPBSAG Negative 09/25/2016   HEPBSAB Non Reactive 09/25/2016   HEPBIGM Negative 09/24/2016      Microbiology:  Recent Results (from the past 240 hour(s))  Blood culture (routine x 2)     Status: None (Preliminary result)   Collection Time: 11/15/16 11:45 AM  Result Value Ref Range Status   Specimen Description BLOOD BLOOD RIGHT WRIST  Final   Special Requests   Final    BOTTLES DRAWN AEROBIC AND ANAEROBIC Blood Culture adequate volume   Culture NO GROWTH 3 DAYS  Final   Report Status PENDING  Incomplete  MRSA PCR Screening     Status: None   Collection Time: 11/15/16 12:38 PM  Result Value Ref Range Status   MRSA by PCR NEGATIVE NEGATIVE Final    Comment:        The GeneXpert MRSA Assay (FDA approved for  NASAL specimens only), is one component of a comprehensive MRSA colonization surveillance program. It is not intended to diagnose MRSA infection nor to guide or monitor treatment for MRSA infections.     Coagulation Studies: No results for input(s): LABPROT, INR in the last 72 hours.  Urinalysis: No results for input(s): COLORURINE, LABSPEC, PHURINE, GLUCOSEU, HGBUR, BILIRUBINUR, KETONESUR, PROTEINUR, UROBILINOGEN, NITRITE, LEUKOCYTESUR in the last 72 hours.  Invalid input(s): APPERANCEUR    Imaging: No results found.   Medications:   . sodium chloride    . ceFEPime (MAXIPIME) IV Stopped (11/17/16 1557)  . vancomycin     . atorvastatin  20 mg Oral QHS  . gabapentin  100 mg Oral QHS  . insulin aspart  0-15 Units Subcutaneous TID WC  . insulin aspart  0-5 Units Subcutaneous QHS  . lamoTRIgine  50 mg Oral Daily   And  . lamoTRIgine  100 mg Oral QHS  . levETIRAcetam  500 mg Oral Daily  . midodrine  5 mg Oral TID WC  . pantoprazole (PROTONIX) IV  40 mg Intravenous Q12H  . sevelamer carbonate  800 mg Oral TID WC   sodium chloride, docusate sodium, [DISCONTINUED] ondansetron **OR** ondansetron (ZOFRAN) IV, oxyCODONE-acetaminophen  Assessment/ Plan:  83  y.o. African [place] male with end-stage renal disease on hemodialysis, hypertension, diabetes type 2, insulin-dependent, seizure disorder, peripheral neuropathy, vascular dementia  MWF [facility] Left arm AVF  1. End Stage Renal Disease: MWF.   - Patient seen and evaluated during hemodialysis. Patient is tolerating well. We will continue dialysis on MWF schedule.  2. AOCKD - Hemoglobin currently 9.4.  Continue Epogen 10,000 units IV with dialysis.  3. SHPTH - Continue to periodically monitor phosphorus. At this time maintain the patient on Renvela 800 mg by mouth 3 times a day.  4. Sepsis, hypotension -  Blood pressure curve has improved today. The patient has been empirically started on vancomycin as well as cefepime. Awaiting culture data.     LOS: 3 Aniyia Rane 8/6/201810:54 AM  [facility] [place], [place]

## 2016-11-18 NOTE — Care Management Important Message (Signed)
Important Message  Patient Details  Name: Michael Acosta MRN: [id number] Date of Birth: Jun 10, 1929   Medicare Important Message Given:  Yes    Beverly Sessions, RN 11/18/2016, 2:47 PM

## 2016-11-18 NOTE — Progress Notes (Signed)
HD completed without issue. Patient tolerated well, no uf as ordered.

## 2016-11-18 NOTE — Progress Notes (Signed)
HD initiated via L AVG without issue. Patient has no current complaints. 3K bath. Labs sent

## 2016-11-18 NOTE — Evaluation (Signed)
Physical Therapy Evaluation Patient Details Name: Michael Acosta MRN: [id number] DOB: 09-12-1929 Today's Date: 11/18/2016   History of Present Illness  Pt is a 83 yo M, admitted to acute care on 8/3 for sepsis. Prior to admission, pt resident at [facility] SNF, amb with rollator (per pt report). PMH: dementia, ESRD, anemia, chronic pain disorder, depression, falls, GERD, HTN, diabetes mellitus, mood disorder, peripheral neuropathy, PVD, prostate cancer, epilepsy, and stroke.    Clinical Impression  Pt is slightly agitated, but willing to participate. Pt with dementia and difficulty with word finding. Pt performs bed mobility with ModA +2, tranfers with ModA, and ambulation with ModA, due to impaired strength, endurance, balance, coordination, and sequencing. Pt amb total of 40 ft with no rest break and B UE support from RW. Overall, pt responded well to today's treatment with no adverse affects. Pt would benefit from skilled PT to address the previously mentioned impairments and promote return to PLOF. Based on the previously mentioned impairments, currently recommending SNF, pending d/c.     Follow Up Recommendations SNF    Equipment Recommendations  None recommended by PT    Recommendations for Other Services       Precautions / Restrictions Precautions Precautions: Fall Restrictions Weight Bearing Restrictions: No      Mobility  Bed Mobility Overal bed mobility: Needs Assistance Bed Mobility: Sit to Supine       Sit to supine: Mod assist;+2 for physical assistance   General bed mobility comments: apt performed sit to supine with ModA +2, requiring mod cues for mechancis and safety. Requires increased time to perform task.   Transfers Overall transfer level: Needs assistance Equipment used: Rolling walker (2 wheeled) Transfers: Sit to/from Stand Sit to Stand: Mod assist         General transfer comment: Pt ModA with STS, presenting with difficulties in sequencing  and delayed initiation of task. pt with md cues for mechancis and safety.   Ambulation/Gait Ambulation/Gait assistance: Mod assist Ambulation Distance (Feet): 40 Feet Assistive device: Rolling walker (2 wheeled) Gait Pattern/deviations: Step-through pattern     General Gait Details: Pt amb total of 40 ft with no rest break, requiring modA.Pt presents with increased postural sway and decreased gait speed. Pt requires min cues for mechancis and safety and minA with obstacle navigation with RW.   Stairs            Wheelchair Mobility    Modified Rankin (Stroke Patients Only)       Balance Overall balance assessment: Needs assistance Sitting-balance support: Bilateral upper extremity supported;Feet supported Sitting balance-Leahy Scale: Good Sitting balance - Comments: Pt with good sitting balance, requiring no cues for mechanics or safety.    Standing balance support: Bilateral upper extremity supported Standing balance-Leahy Scale: Fair Standing balance comment: Pt with fair standing balance, demonstrating posterior leaning and increased postural sway upon initial standing. Pt required modA and mod cues for coming to erect posture and for safety awareness.                              Pertinent Vitals/Pain Pain Assessment: No/denies pain    Home Living Family/patient expects to be discharged to:: Skilled nursing facility                      Prior Function Level of Independence: Needs assistance         Comments: Per pt, he amb with  rollator, however, he is poor historian with hx of dementia and difficulties with word finding.      Hand Dominance        Extremity/Trunk Assessment   Upper Extremity Assessment Upper Extremity Assessment: Overall WFL for tasks assessed    Lower Extremity Assessment Lower Extremity Assessment: Generalized weakness (MMT to B LE's grossly 4/5)       Communication   Communication: Other (comment) (word  finding difficulties. )  Cognition Arousal/Alertness: Awake/alert Behavior During Therapy: Agitated;Impulsive Overall Cognitive Status: Impaired/Different from baseline Area of Impairment: Safety/judgement;Memory;Attention                         Safety/Judgement: Decreased awareness of safety     General Comments: Pt able to follow one-step commands well, but has dificulty communicating, presenting with word finding difficult.       General Comments      Exercises Other Exercises Other Exercises: seated therex performed to B LE's with supervision x10 reps: marches and LAQ's. Pt requried min verbal/visual cues for mechancis. Further therex deferred, as dialysis arrived.    Assessment/Plan    PT Assessment Patient needs continued PT services  PT Problem List Decreased strength;Decreased activity tolerance;Decreased balance;Decreased mobility;Decreased coordination;Decreased cognition;Decreased safety awareness       PT Treatment Interventions DME instruction;Gait training;Functional mobility training;Therapeutic activities;Therapeutic exercise;Balance training;Neuromuscular re-education;Cognitive remediation;Patient/family education;Manual techniques    PT Goals (Current goals can be found in the Care Plan section)  Acute Rehab PT Goals Patient Stated Goal: unable to verbalize PT Goal Formulation: With patient Time For Goal Achievement: 12/02/16 Potential to Achieve Goals: Good    Frequency Min 2X/week   Barriers to discharge        Co-evaluation               AM-PAC PT "6 Clicks" Daily Activity  Outcome Measure Difficulty turning over in bed (including adjusting bedclothes, sheets and blankets)?: Total Difficulty moving from lying on back to sitting on the side of the bed? : Total Difficulty sitting down on and standing up from a chair with arms (e.g., wheelchair, bedside commode, etc,.)?: Total Help needed moving to and from a bed to chair (including  a wheelchair)?: A Lot Help needed walking in hospital room?: A Little Help needed climbing 3-5 steps with a railing? : Total 6 Click Score: 9    End of Session Equipment Utilized During Treatment: Gait belt Activity Tolerance: Patient tolerated treatment well Patient left: in bed;Other (comment) (being taken to dialysis) Nurse Communication: Mobility status PT Visit Diagnosis: Unsteadiness on feet (R26.81);Other abnormalities of gait and mobility (R26.89);Muscle weakness (generalized) (M62.81)    Time: 3662-9476 PT Time Calculation (min) (ACUTE ONLY): 14 min   Charges:         PT G Codes:        Oran Rein PT, SPT  Bevelyn Ngo 11/18/2016, 10:38 AM

## 2016-11-18 NOTE — Care Management (Signed)
Information e faxed to [facility] HD liaison

## 2016-11-18 NOTE — Discharge Instructions (Signed)
Renal and ADA diet. Fall precaution. °

## 2016-11-19 LAB — PROCALCITONIN

## 2016-11-19 LAB — GLUCOSE, CAPILLARY
GLUCOSE-CAPILLARY: 89 mg/dL (ref 65–99)
GLUCOSE-CAPILLARY: 161 mg/dL — AB (ref 65–99)

## 2016-11-19 LAB — HEMOGLOBIN: HEMOGLOBIN: 9.3 g/dL — AB (ref 13.0–18.0)

## 2016-11-19 NOTE — Progress Notes (Signed)
Discharge   PIV removed and transport has arrived.

## 2016-11-19 NOTE — Progress Notes (Signed)
Discharge  Report was called to [facility] and given to RN who will assume care of pt on arrival. Transport has been called. PIV will be removed and pt currently in gown awaiting transport.

## 2016-11-19 NOTE — Progress Notes (Signed)
[place], [place] 11/19/16  Subjective:  Patient completed hemodialysis yesterday. Resting comfortably in bed this a.m. No acute complaints at the moment. Objective:  Vital signs in last 24 hours:  Temp:  [98 F (36.7 C)-98.4 F (36.9 C)] 98.4 F (36.9 C) (08/07 0424) Pulse Rate:  [70-75] 75 (08/07 0443) Resp:  [14-20] 20 (08/07 0424) BP: (117-147)/(39-76) 117/51 (08/07 0443) SpO2:  [96 %-100 %] 98 % (08/07 0424) Weight:  [76.2 kg (167 lb 15.9 oz)] 76.2 kg (167 lb 15.9 oz) (08/06 1250)  Weight change: 3.9 kg (8 lb 9.6 oz) Filed Weights   11/17/16 1045 11/18/16 0940 11/18/16 1250  Weight: 72.3 kg (159 lb 6.3 oz) 76.2 kg (167 lb 15.9 oz) 76.2 kg (167 lb 15.9 oz)    Intake/Output:    Intake/Output Summary (Last 24 hours) at 11/19/16 1150 Last data filed at 11/19/16 0927  Gross per 24 hour  Intake              120 ml  Output                6 ml  Net              114 ml     Physical Exam: General: Chronically ill appearing  HEENT Anicteric, OM moist   Neck supple  Pulm/lungs Clear b/l, normal effort  CVS/Heart S1S2 no rubs  Abdomen:  Soft, NTND, BS present  Extremities: No edema  Neurologic: Awake, alert, following commands   Skin: No acute rashes  Access: Left upper arm AVF       Basic Metabolic Panel:   Recent Labs Lab 11/15/16 0932 11/16/16 0048 11/17/16 0414 11/17/16 1052 11/18/16 1005  NA 139 139 138  --  138  K 3.8 4.3 4.6  --  3.8  CL 100* 105 104  --  100*  CO2 25 22 23   --  27  GLUCOSE 94 78 116*  --  143*  BUN 58* 60* 81*  --  38*  CREATININE 7.09* 7.30* 8.92*  --  5.49*  CALCIUM 8.9 8.4* 8.1*  --  8.3*  MG  --  2.0  --   --   --   PHOS  --  4.8*  --  5.0* 2.7     CBC:  Recent Labs Lab 11/15/16 0932 11/16/16 0048 11/18/16 0505 11/19/16 0547  WBC 6.5 4.6 4.7  --   NEUTROABS 5.1  --   --   --   HGB 10.2* 10.0* 9.4* 9.3*  HCT 30.6* 30.3* 28.3*  --   MCV 95.2 96.6 95.9  --   PLT 194 187 187  --        Lab Results  Component Value Date   HEPBSAG Negative 09/25/2016   HEPBSAB Non Reactive 09/25/2016   HEPBIGM Negative 09/24/2016      Microbiology:  Recent Results (from the past 240 hour(s))  Blood culture (routine x 2)     Status: None (Preliminary result)   Collection Time: 11/15/16 11:45 AM  Result Value Ref Range Status   Specimen Description BLOOD BLOOD RIGHT WRIST  Final   Special Requests   Final    BOTTLES DRAWN AEROBIC AND ANAEROBIC Blood Culture adequate volume   Culture NO GROWTH 4 DAYS  Final   Report Status PENDING  Incomplete  MRSA PCR Screening     Status: None   Collection Time: 11/15/16 12:38 PM  Result Value Ref Range Status   MRSA by  PCR NEGATIVE NEGATIVE Final    Comment:        The GeneXpert MRSA Assay (FDA approved for NASAL specimens only), is one component of a comprehensive MRSA colonization surveillance program. It is not intended to diagnose MRSA infection nor to guide or monitor treatment for MRSA infections.     Coagulation Studies: No results for input(s): LABPROT, INR in the last 72 hours.  Urinalysis: No results for input(s): COLORURINE, LABSPEC, PHURINE, GLUCOSEU, HGBUR, BILIRUBINUR, KETONESUR, PROTEINUR, UROBILINOGEN, NITRITE, LEUKOCYTESUR in the last 72 hours.  Invalid input(s): APPERANCEUR    Imaging: No results found.   Medications:   . sodium chloride     . atorvastatin  20 mg Oral QHS  . epoetin (EPOGEN/PROCRIT) injection  10,000 Units Intravenous Q M,W,F-HD  . gabapentin  100 mg Oral QHS  . insulin aspart  0-9 Units Subcutaneous TID WC  . lamoTRIgine  50 mg Oral Daily   And  . lamoTRIgine  100 mg Oral QHS  . levETIRAcetam  500 mg Oral Daily  . midodrine  5 mg Oral TID WC  . pantoprazole (PROTONIX) IV  40 mg Intravenous Q12H  . sevelamer carbonate  800 mg Oral TID WC   sodium chloride, acetaminophen, docusate sodium, [DISCONTINUED] ondansetron **OR** ondansetron (ZOFRAN) IV,  oxyCODONE-acetaminophen  Assessment/ Plan:  83 y.o. African [place] male with end-stage renal disease on hemodialysis, hypertension, diabetes type 2, insulin-dependent, seizure disorder, peripheral neuropathy, vascular dementia  MWF [facility] Left arm AVF  1. End Stage Renal Disease: MWF.   - Patient seen and evaluated at bedside. No acute indication for dialysis today. We will plan for dialysis again tomorrow if still here. Otherwise he will resume his normal outpatient schedule.  2. AOCKD - Hemoglobin 9.3 at last check. Patient to continue erythropoietin stimulating agents as an outpatient.  3. SHPTH - Phosphorous was 2.7 yesterday. Continue to monitor phosphorus, calcium, and PTH as an outpatient.  4. hypotension -  Blood cultures negative this admission.  Blood pressure currently 117/51. Continue midodrine 5 mg by mouth 3 times a day.     LOS: [place], [place] 8/7/201811:50 AM  [address] [place], [place]

## 2016-11-19 NOTE — Discharge Summary (Addendum)
[place] at [place] NAME: Michael Acosta    MR#:  [id number]  DATE OF BIRTH:  01-05-1930  DATE OF ADMISSION:  11/15/2016 ADMITTING PHYSICIAN: Nicholes Mango, MD  DATE OF DISCHARGE: 11/19/2016  PRIMARY CARE PHYSICIAN: Juluis Pitch, MD    ADMISSION DIAGNOSIS:  Acute encephalopathy [G93.40] Sepsis, due to unspecified organism ([place]) [A41.9]  DISCHARGE DIAGNOSIS:  Active Problems:     Acute encephalopathy   Rectal bleeding  SECONDARY DIAGNOSIS:   Past Medical History:  Diagnosis Date  . Anemia of chronic disease   . Chronic pain disorder   . Dementia   . Depression   . ESRD on hemodialysis ([place])    a. MWF  . Falls frequently   . GERD (gastroesophageal reflux disease)   . Hypertension   . Insulin dependent diabetes mellitus with complications ([place])   . Mood disorder ([place])   . Peripheral neuropathy    a. diabetic neuropathy  . Peripheral vascular disease ([place])    a. s/p prior toe amputation; b. followed by [facility]  . Prostate cancer ([place])   . Seizures ([place])    EPILEPSY  . Stroke [facility])    TIA  . Weakness generalized     HOSPITAL COURSE:  83 year old male with end-stage renal disease on hemodialysis Monday, Wednesday and Friday, dementia and diabetes who presents with unresponsiveness.  1. Acute metabolic encephalopathy in the setting of hypotension and hypothermia. Initially patient was thought to have hypotension with septic shock. He was treated for sepsis with broad-spectrum antibiotics. He was treated for septic shock with pressors. Sepsis has been ruled out. He had negative blood cultures and chest x-ray was negative. All antibiotics were discontinued. His symptoms could have been due to dehydration. He responded well to gentle IV fluids. He will continue on midodrine for hypotension.  2. Rectal bleeding: Patient stated that he had episode of rectal bleeding. His hemoglobin has remained stable at 9.4. He was evaluated by GI.  Patient did not want a colonoscopy. Given That is hemoglobin has remained stable between 9 and 10 GI and his refusal to undergo colonoscopy GI did not pursue further intervention at this time.  3. History of seizures: Continue Keppra and Lamictal   4. Diabetes: Due to hypoglycemia insulin has been discontinued. He will continue sliding scale insulin with oral medicine.   5. End-stage renal disease on hemodialysis: Patient will continue hemodialysis Monday, Wednesday and Friday  6. Hyperlipidemia: Resume statin   DISCHARGE CONDITIONS AND DIET:   Stable for discharge on renal diet   CONSULTS OBTAINED:  Treatment Team:  Murlean Iba, MD [name], MD  DRUG ALLERGIES:   Allergies  Allergen Reactions  . Aspirin Other (See Comments)    Bleeding   . Bc Powder [Aspirin-Salicylamide-Caffeine]     MAR did not specify a reaction  . Caffeine     MAR did not specify a reaction  . 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  . Ramipril Other (See Comments)    Imported from [facility] - reaction: unknown  DISCHARGE MEDICATIONS:   Current Discharge Medication List    CONTINUE these medications which have NOT CHANGED   Details  atorvastatin (LIPITOR) 20 MG tablet Take 20 mg by mouth at bedtime.     docusate sodium (COLACE) 100 MG capsule Take 100 mg by mouth 2 (two) times daily as needed for mild constipation.    gabapentin (NEURONTIN) 100 MG capsule Take 100 mg by mouth at bedtime.    insulin aspart (NOVOLOG) 100  UNIT/ML injection Inject 0-10 Units into the skin See admin instructions. Three times a day before meals. At bedtime on Sunday, Tuesday, Thursday, Saturday only. Dose per sliding scale.    lamoTRIgine (LAMICTAL) 100 MG tablet Take 50-100 mg by mouth 2 (two) times daily. Takes 50mg  in the AM and 100mg  at bedtime.    levETIRAcetam (KEPPRA) 500 MG tablet Take 500 mg by mouth daily.    linagliptin (TRADJENTA) 5 MG TABS tablet Take 5 mg by mouth at bedtime.     metoCLOPramide (REGLAN) 5 MG tablet Take 5 mg by mouth 3 (three) times daily before meals.     midodrine (PROAMATINE) 5 MG tablet Take 1 tablet (5 mg total) by mouth 3 (three) times daily with meals. Qty: 90 tablet, Refills: 3    omeprazole (PRILOSEC) 20 MG capsule Take 20 mg by mouth daily.     ondansetron (ZOFRAN) 4 MG tablet Take 4 mg by mouth every 8 (eight) hours as needed for nausea or vomiting.     promethazine (PHENERGAN) 25 MG/ML injection Inject 25 mg into the muscle every 6 (six) hours as needed for nausea or vomiting.    sevelamer carbonate (RENVELA) 800 MG tablet Take 800 mg by mouth 3 (three) times daily.     Amino Acids-Protein Hydrolys (FEEDING SUPPLEMENT, PRO-STAT SUGAR FREE 64,) LIQD Take 30 mLs by mouth 2 (two) times daily.      STOP taking these medications     glimepiride (AMARYL) 1 MG tablet      HYDROcodone-acetaminophen (NORCO/VICODIN) 5-325 MG tablet      Insulin Detemir (LEVEMIR FLEXPEN) 100 UNIT/ML Pen      sulfamethoxazole-trimethoprim (BACTRIM DS,SEPTRA DS) 800-160 MG tablet           Today   CHIEF COMPLAINT:   No issues today. Reports no rectal bleeding.   VITAL SIGNS:  Blood pressure (!) 117/51, pulse 75, temperature 98.4 F (36.9 C), temperature source Oral, resp. rate 20, height 5\' 8"  (1.727 m), weight 76.2 kg (167 lb 15.9 oz), SpO2 98 %.   REVIEW OF SYSTEMS:  Review of Systems  Constitutional: Negative.  Negative for chills, fever and malaise/fatigue.  HENT: Negative.   Negative for ear discharge, ear pain, hearing loss, nosebleeds and sore throat.   Eyes: Negative.  Negative for blurred vision and pain.  Respiratory: Negative.  Negative for cough, hemoptysis, shortness of breath and wheezing.   Cardiovascular: Negative.  Negative for chest pain, palpitations and leg swelling.  Gastrointestinal: Negative.  Negative for abdominal pain, blood in stool, diarrhea, nausea and vomiting.  Genitourinary: Negative.  Negative for dysuria.  Musculoskeletal: Negative.  Negative for back pain.  Skin: Negative.   Neurological: Negative for dizziness, tremors, speech change, focal weakness, seizures and headaches.  Endo/Heme/Allergies: Negative.  Does not bruise/bleed easily.  Psychiatric/Behavioral: Negative.  Negative for depression, hallucinations and suicidal ideas.     PHYSICAL EXAMINATION:  GENERAL:  83 y.o.-year-old patient lying in the bed with no acute distress.  NECK:  Supple, no jugular venous distention. No thyroid  enlargement, no tenderness.  LUNGS: Normal breath sounds bilaterally, no wheezing, rales,rhonchi  No use of accessory muscles of respiration.  CARDIOVASCULAR: S1, S2 normal. No murmurs, rubs, or gallops.  ABDOMEN: Soft, non-tender, non-distended. Bowel sounds present. No organomegaly or mass.  EXTREMITIES: No pedal edema, cyanosis, or clubbing.  PSYCHIATRIC: The patient is alert and oriented x name, place not time   SKIN: No obvious rash, lesion, or ulcer.   DATA REVIEW:   CBC  Recent Labs Lab 11/18/16 0505 11/19/16 0547  WBC 4.7  --   HGB 9.4* 9.3*  HCT 28.3*  --   PLT 187  --     Chemistries   Recent Labs Lab 11/16/16 0048  11/18/16 1005  NA 139  < > 138  K 4.3  < > 3.8  CL 105  < > 100*  CO2 22  < > 27  GLUCOSE 78  < > 143*  BUN 60*  < > 38*  CREATININE 7.30*  < > 5.49*  CALCIUM 8.4*  < > 8.3*  MG 2.0  --   --   AST 20  --   --   ALT 13*  --   --   ALKPHOS 91  --   --   BILITOT 0.5  --   --   < > = values in this  interval not displayed.  Cardiac Enzymes  Recent Labs Lab 11/15/16 0932  TROPONINI 0.06*    Microbiology Results  @MICRORSLT48 @  RADIOLOGY:  No results found.    Current Discharge Medication List    CONTINUE these medications which have NOT CHANGED   Details  atorvastatin (LIPITOR) 20 MG tablet Take 20 mg by mouth at bedtime.     docusate sodium (COLACE) 100 MG capsule Take 100 mg by mouth 2 (two) times daily as needed for mild constipation.    gabapentin (NEURONTIN) 100 MG capsule Take 100 mg by mouth at bedtime.    insulin aspart (NOVOLOG) 100 UNIT/ML injection Inject 0-10 Units into the skin See admin instructions. Three times a day before meals. At bedtime on Sunday, Tuesday, Thursday, Saturday only. Dose per sliding scale.    lamoTRIgine (LAMICTAL) 100 MG tablet Take 50-100 mg by mouth 2 (two) times daily. Takes 50mg  in the AM and 100mg  at bedtime.    levETIRAcetam (KEPPRA) 500 MG tablet Take 500 mg by mouth daily.    linagliptin (TRADJENTA) 5 MG TABS tablet Take 5 mg by mouth at bedtime.     metoCLOPramide (REGLAN) 5 MG tablet Take 5 mg by mouth 3 (three) times daily before meals.     midodrine (PROAMATINE) 5 MG tablet Take 1 tablet (5 mg total) by mouth 3 (three) times daily with meals. Qty: 90 tablet, Refills: 3    omeprazole (PRILOSEC) 20 MG capsule Take 20 mg by mouth daily.     ondansetron (ZOFRAN) 4 MG tablet Take 4 mg by mouth every 8 (eight) hours as needed for nausea or vomiting.     promethazine (PHENERGAN) 25 MG/ML injection Inject 25 mg into the muscle every 6 (six) hours as needed for nausea or vomiting.    sevelamer carbonate (RENVELA) 800 MG tablet Take 800 mg by mouth 3 (three) times daily.     Amino Acids-Protein Hydrolys (FEEDING SUPPLEMENT, PRO-STAT SUGAR FREE 64,) LIQD Take 30 mLs by mouth 2 (two) times daily.      STOP taking these medications     glimepiride (AMARYL) 1 MG tablet      HYDROcodone-acetaminophen (  NORCO/VICODIN) 5-325  MG tablet      Insulin Detemir (LEVEMIR FLEXPEN) 100 UNIT/ML Pen      sulfamethoxazole-trimethoprim (BACTRIM DS,SEPTRA DS) 800-160 MG tablet             Management plans discussed with the patient and he is in agreement. Stable for discharge snf  Patient should follow up with pcp  CODE STATUS:     Code Status Orders        Start     Ordered   11/15/16 1231  Do not attempt resuscitation (DNR)  Continuous    Question Answer Comment  In the event of cardiac or respiratory ARREST Do not call a "code blue"   In the event of cardiac or respiratory ARREST Do not perform Intubation, CPR, defibrillation or ACLS   In the event of cardiac or respiratory ARREST Use medication by any route, position, wound care, and other measures to relive pain and suffering. May use oxygen, suction and manual treatment of airway obstruction as needed for comfort.   Comments RN may pronounce      11/15/16 1230    Code Status History    Date Active Date Inactive Code Status Order ID Comments User Context   09/25/2016  2:25 PM 09/29/2016  8:03 PM Partial Code [id number]  Knox Royalty, NP Inpatient   09/24/2016 10:42 AM 09/25/2016  2:25 PM Full Code [id number]  Saundra Shelling, MD Inpatient   04/13/2016 12:22 AM 04/18/2016  6:46 PM Full Code [id number]  Holley Raring, NP ED   07/11/2013  3:42 PM 08/02/2013  6:20 PM Full Code [id number]  Genella Rife Inpatient    Advance Directive Documentation     Most Recent Value  Type of Advance Directive  Healthcare Power of [place], Living will  Pre-existing out of facility DNR order (yellow form or pink MOST form)  -  "MOST" Form in Place?  -      TOTAL TIME TAKING CARE OF THIS PATIENT: 37 minutes.    Note: This dictation was prepared with Dragon dictation along with smaller phrase technology. Any transcriptional errors that result from this process are unintentional.  Rebecca Motta M.D on 11/19/2016 at 8:28 AM  Between 7am to 6pm - Pager -  [phone] After 6pm go to [email or web address] - password [email or web address] [facility]  Office  [phone]  CC: Primary care physician; Juluis Pitch, MD

## 2016-11-19 NOTE — Progress Notes (Addendum)
LCSW following as patient admitted from [facility]   Patient medically stable to return to SNF. Today. All clinicals sent to facility for review and call placed to notify facility.  Awaiting call back and will arrange for return.  Anticipate return by EMS and LCSW will arrange. Will update patient and family once all arrangements completed. Facility reports patient to admit after lunch Going to Room:  505  Report: to Nelida Gores  [phone] Call placed to wife and updated regarding discharge plan. Confirms EMS and made aware that Medicare will cover cost. Wife understanding. And still agreeable to EMS for transport.  Plan: DC back to SNF today.  Lane Hacker, MSW Clinical Social Work: [employer] Coverage for :  [phone]

## 2016-11-20 LAB — CULTURE, BLOOD (ROUTINE X 2)
Culture: NO GROWTH
SPECIAL REQUESTS: ADEQUATE

## 2016-11-20 LAB — HEPATITIS B SURFACE ANTIGEN: Hepatitis B Surface Ag: NEGATIVE

## 2016-11-20 LAB — HEPATITIS B SURFACE ANTIBODY, QUANTITATIVE: Hepatitis B-Post: 3.8 m[IU]/mL — ABNORMAL LOW (ref >9.9)

## 2016-11-26 ENCOUNTER — Ambulatory Visit (INDEPENDENT_AMBULATORY_CARE_PROVIDER_SITE_OTHER): Payer: Medicare Other | Admitting: Vascular Surgery

## 2016-11-26 ENCOUNTER — Encounter (INDEPENDENT_AMBULATORY_CARE_PROVIDER_SITE_OTHER): Payer: Self-pay | Admitting: Vascular Surgery

## 2016-11-26 VITALS — BP 132/85 | HR 75 | Resp 16 | Wt 160.0 lb

## 2016-11-26 DIAGNOSIS — Z992 Dependence on renal dialysis: Secondary | ICD-10-CM

## 2016-11-26 DIAGNOSIS — Z794 Long term (current) use of insulin: Secondary | ICD-10-CM

## 2016-11-26 DIAGNOSIS — R531 Weakness: Secondary | ICD-10-CM | POA: Diagnosis not present

## 2016-11-26 DIAGNOSIS — N186 End stage renal disease: Secondary | ICD-10-CM | POA: Diagnosis not present

## 2016-11-26 DIAGNOSIS — IMO0001 Reserved for inherently not codable concepts without codable children: Secondary | ICD-10-CM

## 2016-11-26 DIAGNOSIS — I70261 Atherosclerosis of native arteries of extremities with gangrene, right leg: Secondary | ICD-10-CM

## 2016-11-26 DIAGNOSIS — E119 Type 2 diabetes mellitus without complications: Secondary | ICD-10-CM

## 2016-11-26 DIAGNOSIS — F0390 Unspecified dementia without behavioral disturbance: Secondary | ICD-10-CM | POA: Diagnosis not present

## 2016-11-26 NOTE — Patient Instructions (Signed)

## 2016-11-26 NOTE — Assessment & Plan Note (Signed)
His perfusion appears improved and his gangrenous changes are improved as well. He does have an ulceration on the tip of the toe, but no surgery is currently necessary to resect this. We will plan to recheck him with ABIs in 1 month.

## 2016-11-26 NOTE — Progress Notes (Signed)
MRN : [id number]  Michael [facility] is a 83 y.o. ([birth year]) male who presents with chief complaint of No chief complaint on file. [facility]  History of Present Illness: Patient returns today in follow up of PAD. He had an intervention about 3 weeks ago to improve the perfusion to the right foot. The area of gangrenous changes on the tip of the right second toe remains stable to improved from that visit. He has no pain. No fever or chills. His access site is healed.  Current Outpatient Prescriptions  Medication Sig Dispense Refill  . Amino Acids-Protein Hydrolys (FEEDING SUPPLEMENT, PRO-STAT SUGAR FREE 64,) LIQD Take 30 mLs by mouth 2 (two) times daily.    [facility] atorvastatin (LIPITOR) 20 MG tablet Take 20 mg by mouth at bedtime.     . docusate sodium (COLACE) 100 MG capsule Take 100 mg by mouth 2 (two) times daily as needed for mild constipation.    . gabapentin (NEURONTIN) 100 MG capsule Take 100 mg by mouth at bedtime.    . insulin aspart (NOVOLOG) 100 UNIT/ML injection Inject 0-10 Units into the skin See admin instructions. Three times a day before meals. At bedtime on Sunday, Tuesday, Thursday, Saturday only. Dose per sliding scale.    . lamoTRIgine (LAMICTAL) 100 MG tablet Take 50-100 mg by mouth 2 (two) times daily. Takes 23m in the AM and 1066mat bedtime.    . levETIRAcetam (KEPPRA) 500 MG tablet Take 500 mg by mouth daily.    . Michael Kitcheninagliptin (TRADJENTA) 5 MG TABS tablet Take 5 mg by mouth at bedtime.     . metoCLOPramide (REGLAN) 5 MG tablet Take 5 mg by mouth 3 (three) times daily before meals.     . midodrine (PROAMATINE) 5 MG tablet Take 1 tablet (5 mg total) by mouth 3 (three) times daily with meals. 90 tablet 3  . omeprazole (PRILOSEC) 20 MG capsule Take 20 mg by mouth daily.     . ondansetron (ZOFRAN) 4 MG tablet Take 4 mg by mouth every 8 (eight) hours as needed for nausea or vomiting.     . promethazine (PHENERGAN) 25 MG/ML injection Inject 25 mg into the muscle every 6 (six) hours as  needed for nausea or vomiting.    . sevelamer carbonate (RENVELA) 800 MG tablet Take 800 mg by mouth 3 (three) times daily.      No current facility-administered medications for this visit.     Past Medical History:  Diagnosis Date  . Anemia of chronic disease   . Chronic pain disorder   . Dementia   . Depression   . ESRD on hemodialysis ([facility]   a. MWF  . Falls frequently   . GERD (gastroesophageal reflux disease)   . Hypertension   . Insulin dependent diabetes mellitus with complications ([facility]  . Mood disorder ([facility]  . Peripheral neuropathy    a. diabetic neuropathy  . Peripheral vascular disease ([facility]   a. s/p prior toe amputation; [facility]. Prostate cancer ([facility]  . Seizures ([facility]   EPILEPSY  . St[facility] ([facility]   TIA  . Weakness generalized     Past Surgical History:  Procedure Laterality Date  . COLON SURGERY     RESECTION  . INSERTION OF DIALYSIS CATHETER     SHUNT  . LEG SURGERY Left    MULTIPLE VEIN SURGERIES  . LOWER EXTREMITY ANGIOGRAPHY Right 11/07/2016   Procedure: Lower Extremity Angiography;  Surgeon: Michael Huxley, MD;  Location: [facility];  Service: Cardiovascular;  Laterality: Right;  . TOTAL HIP ARTHROPLASTY Left           Family History  Problem Relation Age of Onset  . CAD Neg Hx   . Diabetes Neg Hx   No bleeding disorders, clotting disorders, or aneurysms  Social History     Social History  Substance Use Topics  . Smoking status: Never Smoker  . Smokeless tobacco: Never Used  . Alcohol use No  No IVDU       Allergies  Allergen Reactions  . Aspirin   . Bc Powder [Aspirin-Salicylamide-Caffeine]   . Penicillins Other (See Comments)    Has patient had a PCN reaction causing immediate rash, facial/tongue/throat swelling, SOB or lightheadedness with hypotension: Unknown Has patient had a PCN reaction causing severe rash involving mucus membranes or skin necrosis: Unknown Has patient had a PCN reaction  that required hospitalization: Unknown Has patient had a PCN reaction occurring within the last 10 years: Unknown If all of the above answers are "NO", then may proceed with Cephalosporin use.  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      REVIEW OF SYSTEMS (Negative unless checked)  Constitutional: _0 Weight loss  _1 Fever  _2 Chills Cardiac: _3 Chest pain   _4 Chest pressure   _5 Palpitations   _6 Shortness of breath when laying flat   _7 Shortness of breath at rest   _8 Shortness of breath with exertion. Vascular:  _9 Pain in legs with walking   _10 Pain in legs at rest   _11 Pain in legs when laying flat   _12 Claudication   _13 Pain in feet when walking  _14 Pain in feet at rest  _15 Pain in feet when laying flat   _16 History of DVT   _17 Phlebitis   _18 Swelling in legs   _19 Varicose veins   _20 Non-healing ulcers Pulmonary:   _21 Uses home oxygen   _22 Productive cough   _23 Hemoptysis   _24 Wheeze  _25 COPD   _26 Asthma Neurologic:  _27 Dizziness  _28 Blackouts   _29 Seizures   _30 History of stroke   _31 History of TIA  _32 Aphasia   _33 Temporary blindness   _34 Dysphagia   _35 Weakness or numbness in arms   _36 Weakness or numbness in legs Musculoskeletal:  _37 Arthritis   _38 Joint swelling   _39 Joint pain   _40 Low back pain Hematologic:  _41 Easy bruising  _42 Easy bleeding   _43 Hypercoagulable state   _44 Anemic  _45 Hepatitis Gastrointestinal:  _46 Blood in stool   _47 Vomiting blood  _48 Gastroesophageal reflux/heartburn   _49 Abdominal pain Genitourinary:  _50 Chronic kidney disease   _51 Difficult urination  _52 Frequent urination  _53 Burning with urination   _54 Hematuria Skin:  _55 Rashes   _56 Ulcers   _57 Wounds Psychological:  _58 History of anxiety   _59  History of major depression.    Physical Examination  BP 132/85   Pulse 75   Resp 16   Wt 160 lb (72.6 kg)   BMI 24.33 kg/m  Gen:  WD/WN, NAD Head: [place]/AT, No temporalis wasting. Ear/Nose/Throat: Hearing grossly intact, nares w/o erythema or drainage, trachea midline Eyes: Conjunctiva clear. Sclera non-icteric Neck:  Supple.  No JVD.  Pulmonary:  Good air movement, no use of accessory muscles.  Cardiac: RRR, normal S1, S2 Vascular:  Vessel Right Left  Radial Palpable Palpable                          PT 1+ Palpable 1+ Palpable  DP Palpable 1+ Palpable    Musculoskeletal: Diffusely weak. Uses wheelchair. 1 cm ulcer on tip of  right second toe improved.  1+ LE edema. Neurologic: Sensation grossly intact in extremities.  Symmetrical.  Speech is fluent.  Psychiatric: Judgment and insight are fair. Dermatologic: open wound on right second toe as above.      Labs Recent Results (from the past 2160 hour(s))  Lactic acid, plasma     Status: Abnormal   Collection Time: 09/24/16  5:05 AM  Result Value Ref Range   Lactic Acid, Venous 3.2 (HH) 0.5 - 1.9 mmol/L    Comment: CRITICAL RESULT CALLED TO, READ BACK BY AND VERIFIED WITH KALA KEEN ON 09/24/16 AT 0600 QSD   Blood gas, arterial ([facility], [facility], [facility])     Status: Abnormal   Collection Time: 09/24/16  5:05 AM  Result Value Ref Range   FIO2 1.00    Delivery systems NON-REBREATHER OXYGEN MASK    pH, Arterial 7.48 (H) 7.350 - 7.450   pCO2 arterial 41 32.0 - 48.0 mmHg   pO2, Arterial 81 (L) 83.0 - 108.0 mmHg   Bicarbonate 30.5 (H) 20.0 - 28.0 mmol/L   Acid-Base Excess 6.4 (H) 0.0 - 2.0 mmol/L   O2 Saturation 96.7 %   Patient temperature 37.0    Collection site REVIEWED BY    Sample type ARTERIAL DRAW    Allens test (pass/fail) PASS PASS  Blood Culture (routine x 2)     Status: None   Collection Time: 09/24/16  6:20 AM  Result Value Ref Range   Specimen Description BLOOD ARTERIAL DRAW FROM R ARM    Special Requests BOTTLES DRAWN AEROBIC AND ANAEROBIC BCAV    Culture NO GROWTH 5 DAYS    Report Status 09/29/2016 FINAL   Blood Culture (routine x 2)     Status: Abnormal   Collection Time: 09/24/16  6:32 AM  Result Value Ref Range   Specimen Description BLOOD R HAND    Special Requests      BOTTLES DRAWN AEROBIC AND ANAEROBIC Blood Culture  adequate volume   Culture  Setup Time      GRAM NEGATIVE RODS AEROBIC BOTTLE ONLY CRITICAL RESULT CALLED TO, READ BACK BY AND VERIFIED WITH: HANK ZOMPA ON 09/25/16 AT 7035 QSD Performed at [facility], [address], [place], [place] [address]    Culture ESCHERICHIA COLI (A)    Report Status 09/27/2016 FINAL    Organism ID, Bacteria ESCHERICHIA COLI       Susceptibility   Escherichia coli - MIC*    AMPICILLIN 8 SENSITIVE Sensitive     CEFAZOLIN <=4 SENSITIVE Sensitive     CEFEPIME <=1 SENSITIVE Sensitive     CEFTAZIDIME <=1 SENSITIVE Sensitive     CEFTRIAXONE <=1 SENSITIVE Sensitive     CIPROFLOXACIN >=4 RESISTANT Resistant     GENTAMICIN <=1 SENSITIVE Sensitive     IMIPENEM <=0.25 SENSITIVE Sensitive     TRIMETH/SULFA <=20 SENSITIVE Sensitive     AMPICILLIN/SULBACTAM 4 SENSITIVE Sensitive     PIP/TAZO <=4 SENSITIVE Sensitive     Extended ESBL NEGATIVE Sensitive     * ESCHERICHIA COLI  Blood Culture ID Panel (Reflexed)     Status: Abnormal   Collection Time: 09/24/16  6:32 AM  Result Value Ref Range   Enterococcus species NOT DETECTED NOT DETECTED   Listeria monocytogenes NOT DETECTED NOT DETECTED   Staphylococcus species NOT DETECTED NOT DETECTED   Staphylococcus aureus NOT DETECTED NOT DETECTED   Streptococcus species NOT DETECTED NOT DETECTED   Streptococcus agalactiae NOT DETECTED NOT DETECTED   Streptococcus pneumoniae NOT  DETECTED NOT DETECTED   Streptococcus pyogenes NOT DETECTED NOT DETECTED   Acinetobacter baumannii NOT DETECTED NOT DETECTED   Enterobacteriaceae species DETECTED (A) NOT DETECTED    Comment: Enterobacteriaceae represent a large family of gram-negative bacteria, not a single organism. CRITICAL RESULT CALLED TO, READ BACK BY AND VERIFIED WITH: HANK ZOMPA ON 09/25/16 AT 0944 QSD    Enterobacter cloacae complex NOT DETECTED NOT DETECTED   Escherichia coli DETECTED (A) NOT DETECTED    Comment: CRITICAL RESULT CALLED TO, READ BACK BY AND VERIFIED  WITH: HANK ZOMPA ON 09/25/16 AT 0944 QSD    Klebsiella oxytoca NOT DETECTED NOT DETECTED   Klebsiella pneumoniae NOT DETECTED NOT DETECTED   Proteus species NOT DETECTED NOT DETECTED   Serratia marcescens NOT DETECTED NOT DETECTED   Carbapenem resistance NOT DETECTED NOT DETECTED   Haemophilus influenzae NOT DETECTED NOT DETECTED   Neisseria meningitidis NOT DETECTED NOT DETECTED   Pseudomonas aeruginosa NOT DETECTED NOT DETECTED   Candida albicans NOT DETECTED NOT DETECTED   Candida glabrata NOT DETECTED NOT DETECTED   Candida krusei NOT DETECTED NOT DETECTED   Candida parapsilosis NOT DETECTED NOT DETECTED   Candida tropicalis NOT DETECTED NOT DETECTED  Comprehensive metabolic panel     Status: Abnormal   Collection Time: 09/24/16  6:38 AM  Result Value Ref Range   Sodium 142 135 - 145 mmol/L   Potassium 4.2 3.5 - 5.1 mmol/L   Chloride 95 (L) 101 - 111 mmol/L   CO2 31 22 - 32 mmol/L   Glucose, Bld 206 (H) 65 - 99 mg/dL   BUN 48 (H) 6 - 20 mg/dL   Creatinine, Ser 5.73 (H) 0.61 - 1.24 mg/dL   Calcium 9.4 8.9 - 10.3 mg/dL   Total Protein 8.3 (H) 6.5 - 8.1 g/dL   Albumin 3.7 3.5 - 5.0 g/dL   AST 243 (H) 15 - 41 U/L   ALT 198 (H) 17 - 63 U/L   Alkaline Phosphatase 258 (H) 38 - 126 U/L   Total Bilirubin 2.5 (H) 0.3 - 1.2 mg/dL   GFR calc non Af Amer 8 (L) >60 mL/min   GFR calc Af Amer 9 (L) >60 mL/min    Comment: (NOTE) The eGFR has been calculated using the CKD EPI equation. This calculation has not been validated in all clinical situations. eGFR's persistently <60 mL/min signify possible Chronic Kidney Disease.    Anion gap 16 (H) 5 - 15  CBC WITH DIFFERENTIAL     Status: Abnormal   Collection Time: 09/24/16  6:38 AM  Result Value Ref Range   WBC 21.1 (H) 3.8 - 10.6 K/uL   RBC 3.64 (L) 4.40 - 5.90 MIL/uL   Hemoglobin 11.0 (L) 13.0 - 18.0 g/dL   HCT 33.8 (L) 40.0 - 52.0 %   MCV 93.1 80.0 - 100.0 fL   MCH 30.3 26.0 - 34.0 pg   MCHC 32.6 32.0 - 36.0 g/dL   RDW 16.0 (H)  11.5 - 14.5 %   Platelets 196 150 - 440 K/uL   Neutrophils Relative % 94 %   Lymphocytes Relative 2 %   Monocytes Relative 4 %   Eosinophils Relative 0 %   Basophils Relative 0 %   Band Neutrophils 0 %   Metamyelocytes Relative 0 %   Myelocytes 0 %   Promyelocytes Absolute 0 %   Blasts 0 %   nRBC 0 0 /100 WBC   Other 0 %   Neutro Abs 19.9 (H) 1.4 - 6.5  K/uL   Lymphs Abs 0.4 (L) 1.0 - 3.6 K/uL   Monocytes Absolute 0.8 0.2 - 1.0 K/uL   Eosinophils Absolute 0.0 0 - 0.7 K/uL   Basophils Absolute 0.0 0 - 0.1 K/uL  Lipase, blood     Status: Abnormal   Collection Time: 09/24/16  6:38 AM  Result Value Ref Range   Lipase 2,491 (H) 11 - 51 U/L    Comment: RESULT CONFIRMED BY MANUAL DILUTION ALV  Troponin I     Status: Abnormal   Collection Time: 09/24/16  6:38 AM  Result Value Ref Range   Troponin I 0.07 (HH) <0.03 ng/mL    Comment: CRITICAL RESULT CALLED TO, READ BACK BY AND VERIFIED WITH SAMANTHA HAMILTON AT 1314 ON 09/24/16 ALV   Hepatitis panel, acute     Status: None   Collection Time: 09/24/16  6:38 AM  Result Value Ref Range   Hepatitis B Surface Ag Negative Negative   HCV Ab <0.1 0.0 - 0.9 s/co ratio    Comment: (NOTE)                                  Negative:     < 0.8                             Indeterminate: 0.8 - 0.9                                  Positive:     > 0.9 The CDC recommends that a positive HCV antibody result be followed up with a HCV Nucleic Acid Amplification test ([id number]). Performed At: [facility] [place], [place] [id number] Lindon Romp MD [id number]    Hep A IgM Negative Negative   Hep B C IgM Negative Negative  Glucose, capillary     Status: Abnormal   Collection Time: 09/24/16  8:58 AM  Result Value Ref Range   Glucose-Capillary 207 (H) 65 - 99 mg/dL  Lactic acid, plasma     Status: Abnormal   Collection Time: 09/24/16  9:38 AM  Result Value Ref Range   Lactic Acid, Venous 2.1 (HH) 0.5 - 1.9 mmol/L     Comment: CRITICAL RESULT CALLED TO, READ BACK BY AND VERIFIED WITH MYRA FLOWERS AT 1023 ON 09/24/16 ALV   Lactic acid, plasma     Status: None   Collection Time: 09/24/16 11:10 AM  Result Value Ref Range   Lactic Acid, Venous 1.7 0.5 - 1.9 mmol/L  Troponin I     Status: Abnormal   Collection Time: 09/24/16 11:10 AM  Result Value Ref Range   Troponin I 0.18 (HH) <0.03 ng/mL    Comment: CRITICAL VALUE NOTED. VALUE IS CONSISTENT WITH PREVIOUSLY REPORTED/CALLED VALUE ALV  Glucose, capillary     Status: Abnormal   Collection Time: 09/24/16 12:23 PM  Result Value Ref Range   Glucose-Capillary 219 (H) 65 - 99 mg/dL  Glucose, capillary     Status: Abnormal   Collection Time: 09/24/16  4:28 PM  Result Value Ref Range   Glucose-Capillary 206 (H) 65 - 99 mg/dL  Troponin I     Status: Abnormal   Collection Time: 09/24/16  4:36 PM  Result Value Ref Range   Troponin I 0.43 (HH) <0.03 ng/mL    Comment:  CRITICAL VALUE NOTED. VALUE IS CONSISTENT WITH PREVIOUSLY REPORTED/CALLED VALUE JJB  MRSA PCR Screening     Status: None   Collection Time: 09/24/16  9:00 PM  Result Value Ref Range   MRSA by PCR NEGATIVE NEGATIVE    Comment:        The GeneXpert MRSA Assay (FDA approved for NASAL specimens only), is one component of a comprehensive MRSA colonization surveillance program. It is not intended to diagnose MRSA infection nor to guide or monitor treatment for MRSA infections.   Glucose, capillary     Status: Abnormal   Collection Time: 09/24/16 10:11 PM  Result Value Ref Range   Glucose-Capillary 189 (H) 65 - 99 mg/dL  Troponin I     Status: Abnormal   Collection Time: 09/24/16 10:59 PM  Result Value Ref Range   Troponin I 0.77 (HH) <0.03 ng/mL    Comment: CRITICAL VALUE NOTED. VALUE CONSISTENT WITH PREVIOUSLY REPORTED/CALLED VALUE...MSS CORRECTED ON 06/13 AT 0630: PREVIOUSLY REPORTED AS 0.77 CRITICAL RESULT CALLED TO, READ BACK BY AND VERIFIED WITH.MSS   CBC     Status: Abnormal    Collection Time: 09/25/16  4:52 AM  Result Value Ref Range   WBC 14.8 (H) 3.8 - 10.6 K/uL   RBC 3.58 (L) 4.40 - 5.90 MIL/uL   Hemoglobin 11.1 (L) 13.0 - 18.0 g/dL   HCT 34.3 (L) 40.0 - 52.0 %   MCV 95.7 80.0 - 100.0 fL   MCH 31.0 26.0 - 34.0 pg   MCHC 32.3 32.0 - 36.0 g/dL   RDW 15.8 (H) 11.5 - 14.5 %   Platelets 193 150 - 440 K/uL  Comprehensive metabolic panel     Status: Abnormal   Collection Time: 09/25/16  4:52 AM  Result Value Ref Range   Sodium 140 135 - 145 mmol/L   Potassium 5.0 3.5 - 5.1 mmol/L   Chloride 100 (L) 101 - 111 mmol/L   CO2 27 22 - 32 mmol/L   Glucose, Bld 191 (H) 65 - 99 mg/dL   BUN 70 (H) 6 - 20 mg/dL   Creatinine, Ser 6.96 (H) 0.61 - 1.24 mg/dL   Calcium 8.7 (L) 8.9 - 10.3 mg/dL   Total Protein 7.8 6.5 - 8.1 g/dL   Albumin 3.4 (L) 3.5 - 5.0 g/dL   AST 139 (H) 15 - 41 U/L   ALT 156 (H) 17 - 63 U/L   Alkaline Phosphatase 212 (H) 38 - 126 U/L   Total Bilirubin 1.6 (H) 0.3 - 1.2 mg/dL   GFR calc non Af Amer 6 (L) >60 mL/min   GFR calc Af Amer 7 (L) >60 mL/min    Comment: (NOTE) The eGFR has been calculated using the CKD EPI equation. This calculation has not been validated in all clinical situations. eGFR's persistently <60 mL/min signify possible Chronic Kidney Disease.    Anion gap 13 5 - 15  Troponin I     Status: Abnormal   Collection Time: 09/25/16  4:52 AM  Result Value Ref Range   Troponin I 1.45 (HH) <0.03 ng/mL    Comment: CRITICAL VALUE NOTED. VALUE IS CONSISTENT WITH PREVIOUSLY REPORTED/CALLED VALUE...[place]  Lipase, blood     Status: Abnormal   Collection Time: 09/25/16  4:52 AM  Result Value Ref Range   Lipase 1,189 (H) 11 - 51 U/L    Comment: RESULT CONFIRMED BY MANUAL DILUTION  Amylase     Status: Abnormal   Collection Time: 09/25/16  4:52 AM  Result Value Ref Range  Amylase 1,645 (H) 28 - 100 U/L  Triglycerides     Status: None   Collection Time: 09/25/16  4:52 AM  Result Value Ref Range   Triglycerides 97 <150 mg/dL  APTT      Status: None   Collection Time: 09/25/16  4:52 AM  Result Value Ref Range   aPTT 35 24 - 36 seconds  Protime-INR     Status: None   Collection Time: 09/25/16  4:52 AM  Result Value Ref Range   Prothrombin Time 14.9 11.4 - 15.2 seconds   INR 1.16   Hepatitis B surface antigen     Status: None   Collection Time: 09/25/16  4:52 AM  Result Value Ref Range   Hepatitis B Surface Ag Negative Negative    Comment: (NOTE) Performed At: [facility] [place], [place] [id number] Lindon Romp MD [id number]   Glucose, capillary     Status: Abnormal   Collection Time: 09/25/16  7:30 AM  Result Value Ref Range   Glucose-Capillary 174 (H) 65 - 99 mg/dL  Troponin I (q 6hr x 3)     Status: Abnormal   Collection Time: 09/25/16  9:59 AM  Result Value Ref Range   Troponin I 1.21 (HH) <0.03 ng/mL    Comment: CRITICAL VALUE NOTED. VALUE IS CONSISTENT WITH PREVIOUSLY REPORTED/CALLED VALUE ALV  Hepatitis B surface antibody     Status: None   Collection Time: 09/25/16  9:59 AM  Result Value Ref Range   Hep B S Ab Non Reactive     Comment: (NOTE)              Non Reactive: Inconsistent with immunity,                            less than 10 mIU/mL              Reactive:     Consistent with immunity,                            greater than 9.9 mIU/mL Performed At: [facility] [place], [place] [id number] Lindon Romp MD [id number]   Hepatitis B core antibody, total     Status: None   Collection Time: 09/25/16  9:59 AM  Result Value Ref Range   Hep B Core Total Ab Negative Negative    Comment: (NOTE) Performed At: [facility] [place], [place] [id number] Lindon Romp MD [id number]   Magnesium     Status: None   Collection Time: 09/25/16  9:59 AM  Result Value Ref Range   Magnesium 1.8 1.7 - 2.4 mg/dL  Glucose, capillary     Status: Abnormal   Collection Time: 09/25/16 11:59 AM  Result Value Ref Range    Glucose-Capillary 106 (H) 65 - 99 mg/dL  ECHOCARDIOGRAM COMPLETE     Status: None   Collection Time: 09/25/16  3:23 PM  Result Value Ref Range   Weight 2,846.58 oz   Height 66 in   BP 94/62 mmHg  Glucose, capillary     Status: Abnormal   Collection Time: 09/25/16  4:49 PM  Result Value Ref Range   Glucose-Capillary 184 (H) 65 - 99 mg/dL  Troponin I (q 6hr x 3)     Status: Abnormal   Collection Time: 09/25/16  6:08 PM  Result Value Ref Range  Troponin I 0.99 (HH) <0.03 ng/mL    Comment: CRITICAL VALUE NOTED. VALUE IS CONSISTENT WITH PREVIOUSLY REPORTED/CALLED VALUE.MSS  Heparin level (unfractionated)     Status: None   Collection Time: 09/25/16  6:08 PM  Result Value Ref Range   Heparin Unfractionated 0.42 0.30 - 0.70 IU/mL    Comment:        IF HEPARIN RESULTS ARE BELOW EXPECTED VALUES, AND PATIENT DOSAGE HAS BEEN CONFIRMED, SUGGEST FOLLOW UP TESTING OF ANTITHROMBIN III LEVELS.   Glucose, capillary     Status: Abnormal   Collection Time: 09/25/16  8:50 PM  Result Value Ref Range   Glucose-Capillary 236 (H) 65 - 99 mg/dL   Comment 1 Notify RN    Comment 2 Document in Chart   Glucose, capillary     Status: Abnormal   Collection Time: 09/25/16 10:06 PM  Result Value Ref Range   Glucose-Capillary 246 (H) 65 - 99 mg/dL   Comment 1 Notify RN    Comment 2 Document in Chart   Troponin I (q 6hr x 3)     Status: Abnormal   Collection Time: 09/25/16 11:23 PM  Result Value Ref Range   Troponin I 0.79 (HH) <0.03 ng/mL    Comment: CRITICAL VALUE NOTED. VALUE IS CONSISTENT WITH PREVIOUSLY REPORTED/CALLED VALUE BY CAF   CBC     Status: Abnormal   Collection Time: 09/26/16  2:07 AM  Result Value Ref Range   WBC 8.9 3.8 - 10.6 K/uL   RBC 2.89 (L) 4.40 - 5.90 MIL/uL   Hemoglobin 8.9 (L) 13.0 - 18.0 g/dL   HCT 27.3 (L) 40.0 - 52.0 %   MCV 94.4 80.0 - 100.0 fL   MCH 30.9 26.0 - 34.0 pg   MCHC 32.7 32.0 - 36.0 g/dL   RDW 15.5 (H) 11.5 - 14.5 %   Platelets 172 150 - 440 K/uL    Lipase, blood     Status: Abnormal   Collection Time: 09/26/16  2:07 AM  Result Value Ref Range   Lipase 1,444 (H) 11 - 51 U/L    Comment: RESULT CONFIRMED BY MANUAL DILUTION. CAF  Amylase     Status: Abnormal   Collection Time: 09/26/16  2:07 AM  Result Value Ref Range   Amylase 1,312 (H) 28 - 100 U/L  Comprehensive metabolic panel     Status: Abnormal   Collection Time: 09/26/16  2:07 AM  Result Value Ref Range   Sodium 138 135 - 145 mmol/L   Potassium 4.0 3.5 - 5.1 mmol/L   Chloride 98 (L) 101 - 111 mmol/L   CO2 31 22 - 32 mmol/L   Glucose, Bld 247 (H) 65 - 99 mg/dL   BUN 44 (H) 6 - 20 mg/dL   Creatinine, Ser 4.75 (H) 0.61 - 1.24 mg/dL   Calcium 8.2 (L) 8.9 - 10.3 mg/dL   Total Protein 6.6 6.5 - 8.1 g/dL   Albumin 2.9 (L) 3.5 - 5.0 g/dL   AST 79 (H) 15 - 41 U/L   ALT 95 (H) 17 - 63 U/L   Alkaline Phosphatase 185 (H) 38 - 126 U/L   Total Bilirubin 0.8 0.3 - 1.2 mg/dL   GFR calc non Af Amer 10 (L) >60 mL/min   GFR calc Af Amer 12 (L) >60 mL/min    Comment: (NOTE) The eGFR has been calculated using the CKD EPI equation. This calculation has not been validated in all clinical situations. eGFR's persistently <60 mL/min signify possible Chronic Kidney Disease.  Anion gap 9 5 - 15  Heparin level (unfractionated)     Status: Abnormal   Collection Time: 09/26/16  2:07 AM  Result Value Ref Range   Heparin Unfractionated 0.25 (L) 0.30 - 0.70 IU/mL    Comment:        IF HEPARIN RESULTS ARE BELOW EXPECTED VALUES, AND PATIENT DOSAGE HAS BEEN CONFIRMED, SUGGEST FOLLOW UP TESTING OF ANTITHROMBIN III LEVELS.   Glucose, capillary     Status: Abnormal   Collection Time: 09/26/16  7:53 AM  Result Value Ref Range   Glucose-Capillary 183 (H) 65 - 99 mg/dL  Glucose, capillary     Status: Abnormal   Collection Time: 09/26/16 11:57 AM  Result Value Ref Range   Glucose-Capillary 208 (H) 65 - 99 mg/dL  Glucose, capillary     Status: Abnormal   Collection Time: 09/26/16  4:07 PM   Result Value Ref Range   Glucose-Capillary 156 (H) 65 - 99 mg/dL  Glucose, capillary     Status: Abnormal   Collection Time: 09/26/16  9:24 PM  Result Value Ref Range   Glucose-Capillary 196 (H) 65 - 99 mg/dL   Comment 1 Notify RN    Comment 2 Document in Chart   CBC     Status: Abnormal   Collection Time: 09/27/16  4:50 AM  Result Value Ref Range   WBC 6.9 3.8 - 10.6 K/uL   RBC 2.84 (L) 4.40 - 5.90 MIL/uL   Hemoglobin 9.0 (L) 13.0 - 18.0 g/dL   HCT 26.7 (L) 40.0 - 52.0 %   MCV 94.0 80.0 - 100.0 fL   MCH 31.7 26.0 - 34.0 pg   MCHC 33.7 32.0 - 36.0 g/dL   RDW 15.7 (H) 11.5 - 14.5 %   Platelets 166 150 - 440 K/uL  Basic metabolic panel     Status: Abnormal   Collection Time: 09/27/16  4:50 AM  Result Value Ref Range   Sodium 139 135 - 145 mmol/L   Potassium 4.2 3.5 - 5.1 mmol/L   Chloride 98 (L) 101 - 111 mmol/L   CO2 30 22 - 32 mmol/L   Glucose, Bld 189 (H) 65 - 99 mg/dL   BUN 61 (H) 6 - 20 mg/dL   Creatinine, Ser 6.54 (H) 0.61 - 1.24 mg/dL   Calcium 8.3 (L) 8.9 - 10.3 mg/dL   GFR calc non Af Amer 7 (L) >60 mL/min   GFR calc Af Amer 8 (L) >60 mL/min    Comment: (NOTE) The eGFR has been calculated using the CKD EPI equation. This calculation has not been validated in all clinical situations. eGFR's persistently <60 mL/min signify possible Chronic Kidney Disease.    Anion gap 11 5 - 15  Glucose, capillary     Status: Abnormal   Collection Time: 09/27/16  7:28 AM  Result Value Ref Range   Glucose-Capillary 152 (H) 65 - 99 mg/dL  Glucose, capillary     Status: Abnormal   Collection Time: 09/27/16  4:21 PM  Result Value Ref Range   Glucose-Capillary 165 (H) 65 - 99 mg/dL  Glucose, capillary     Status: Abnormal   Collection Time: 09/27/16  9:23 PM  Result Value Ref Range   Glucose-Capillary 238 (H) 65 - 99 mg/dL   Comment 1 Notify RN    Comment 2 Document in Chart   Glucose, capillary     Status: Abnormal   Collection Time: 09/28/16  7:40 AM  Result Value Ref Range    Glucose-Capillary  127 (H) 65 - 99 mg/dL   Comment 1 Notify RN   Comprehensive metabolic panel     Status: Abnormal   Collection Time: 09/28/16  9:37 AM  Result Value Ref Range   Sodium 139 135 - 145 mmol/L   Potassium 3.8 3.5 - 5.1 mmol/L   Chloride 96 (L) 101 - 111 mmol/L   CO2 29 22 - 32 mmol/L   Glucose, Bld 192 (H) 65 - 99 mg/dL   BUN 40 (H) 6 - 20 mg/dL   Creatinine, Ser 4.80 (H) 0.61 - 1.24 mg/dL   Calcium 8.5 (L) 8.9 - 10.3 mg/dL   Total Protein 7.5 6.5 - 8.1 g/dL   Albumin 3.2 (L) 3.5 - 5.0 g/dL   AST 50 (H) 15 - 41 U/L   ALT 54 17 - 63 U/L   Alkaline Phosphatase 192 (H) 38 - 126 U/L   Total Bilirubin 0.9 0.3 - 1.2 mg/dL   GFR calc non Af Amer 10 (L) >60 mL/min   GFR calc Af Amer 12 (L) >60 mL/min    Comment: (NOTE) The eGFR has been calculated using the CKD EPI equation. This calculation has not been validated in all clinical situations. eGFR's persistently <60 mL/min signify possible Chronic Kidney Disease.    Anion gap 14 5 - 15  CBC     Status: Abnormal   Collection Time: 09/28/16  9:37 AM  Result Value Ref Range   WBC 8.1 3.8 - 10.6 K/uL   RBC 3.30 (L) 4.40 - 5.90 MIL/uL   Hemoglobin 10.0 (L) 13.0 - 18.0 g/dL   HCT 30.3 (L) 40.0 - 52.0 %   MCV 92.0 80.0 - 100.0 fL   MCH 30.3 26.0 - 34.0 pg   MCHC 32.9 32.0 - 36.0 g/dL   RDW 15.3 (H) 11.5 - 14.5 %   Platelets 211 150 - 440 K/uL  Lipase, blood     Status: Abnormal   Collection Time: 09/28/16  9:37 AM  Result Value Ref Range   Lipase 577 (H) 11 - 51 U/L    Comment: RESULT CONFIRMED BY MANUAL DILUTION. TCH.  Glucose, capillary     Status: Abnormal   Collection Time: 09/28/16 11:31 AM  Result Value Ref Range   Glucose-Capillary 246 (H) 65 - 99 mg/dL   Comment 1 Notify RN   Glucose, capillary     Status: Abnormal   Collection Time: 09/28/16  4:41 PM  Result Value Ref Range   Glucose-Capillary 116 (H) 65 - 99 mg/dL   Comment 1 Notify RN   Glucose, capillary     Status: Abnormal   Collection Time:  09/28/16  8:59 PM  Result Value Ref Range   Glucose-Capillary 253 (H) 65 - 99 mg/dL   Comment 1 Notify RN    Comment 2 Document in Chart   Comprehensive metabolic panel     Status: Abnormal   Collection Time: 09/29/16  5:35 AM  Result Value Ref Range   Sodium 139 135 - 145 mmol/L   Potassium 4.1 3.5 - 5.1 mmol/L   Chloride 98 (L) 101 - 111 mmol/L   CO2 31 22 - 32 mmol/L   Glucose, Bld 178 (H) 65 - 99 mg/dL   BUN 53 (H) 6 - 20 mg/dL   Creatinine, Ser 5.91 (H) 0.61 - 1.24 mg/dL   Calcium 8.3 (L) 8.9 - 10.3 mg/dL   Total Protein 6.9 6.5 - 8.1 g/dL   Albumin 2.8 (L) 3.5 - 5.0 g/dL   AST  38 15 - 41 U/L   ALT 29 17 - 63 U/L   Alkaline Phosphatase 178 (H) 38 - 126 U/L   Total Bilirubin 0.6 0.3 - 1.2 mg/dL   GFR calc non Af Amer 8 (L) >60 mL/min   GFR calc Af Amer 9 (L) >60 mL/min    Comment: (NOTE) The eGFR has been calculated using the CKD EPI equation. This calculation has not been validated in all clinical situations. eGFR's persistently <60 mL/min signify possible Chronic Kidney Disease.    Anion gap 10 5 - 15  Lipase, blood     Status: Abnormal   Collection Time: 09/29/16  5:35 AM  Result Value Ref Range   Lipase 559 (H) 11 - 51 U/L    Comment: RESULT CONFIRMED BY MANUAL DILUTION/HKP  Glucose, capillary     Status: Abnormal   Collection Time: 09/29/16  7:51 AM  Result Value Ref Range   Glucose-Capillary 230 (H) 65 - 99 mg/dL  Glucose, capillary     Status: Abnormal   Collection Time: 09/29/16 11:40 AM  Result Value Ref Range   Glucose-Capillary 270 (H) 65 - 99 mg/dL  Potassium [facility] [place] only)     Status: None   Collection Time: 11/07/16 12:00 PM  Result Value Ref Range   Potassium [facility] [place]) 4.1 3.5 - 5.1  Glucose, capillary     Status: Abnormal   Collection Time: 11/07/16  2:02 PM  Result Value Ref Range   Glucose-Capillary 140 (H) 65 - 99 mg/dL  Procalcitonin     Status: None   Collection Time: 11/15/16  9:10 AM  Result Value Ref Range    Procalcitonin 0.20 ng/mL    Comment:        Interpretation: PCT (Procalcitonin) <= 0.5 ng/mL: Systemic infection (sepsis) is not likely. Local bacterial infection is possible. (NOTE)         ICU PCT Algorithm               Non ICU PCT Algorithm    ----------------------------     ------------------------------         PCT < 0.25 ng/mL                 PCT < 0.1 ng/mL     Stopping of antibiotics            Stopping of antibiotics       strongly encouraged.               strongly encouraged.    ----------------------------     ------------------------------       PCT level decrease by               PCT < 0.25 ng/mL       >= 80% from peak PCT       OR PCT 0.25 - 0.5 ng/mL          Stopping of antibiotics                                             encouraged.     Stopping of antibiotics           encouraged.    ----------------------------     ------------------------------       PCT level decrease by  PCT >= 0.25 ng/mL       < 80% from peak PCT        AND PCT >= 0.5 ng/mL            Continuin g antibiotics                                              encouraged.       Continuing antibiotics            encouraged.    ----------------------------     ------------------------------     PCT level increase compared          PCT > 0.5 ng/mL         with peak PCT AND          PCT >= 0.5 ng/mL             Escalation of antibiotics                                          strongly encouraged.      Escalation of antibiotics        strongly encouraged.   CBC with Differential/Platelet     Status: Abnormal   Collection Time: 11/15/16  9:32 AM  Result Value Ref Range   WBC 6.5 3.8 - 10.6 K/uL   RBC 3.22 (L) 4.40 - 5.90 MIL/uL   Hemoglobin 10.2 (L) 13.0 - 18.0 g/dL   HCT 30.6 (L) 40.0 - 52.0 %   MCV 95.2 80.0 - 100.0 fL   MCH 31.6 26.0 - 34.0 pg   MCHC 33.2 32.0 - 36.0 g/dL   RDW 15.0 (H) 11.5 - 14.5 %   Platelets 194 150 - 440 K/uL   Neutrophils Relative % 78 %   Neutro  Abs 5.1 1.4 - 6.5 K/uL   Lymphocytes Relative 10 %   Lymphs Abs 0.7 (L) 1.0 - 3.6 K/uL   Monocytes Relative 11 %   Monocytes Absolute 0.7 0.2 - 1.0 K/uL   Eosinophils Relative 1 %   Eosinophils Absolute 0.1 0 - 0.7 K/uL   Basophils Relative 0 %   Basophils Absolute 0.0 0 - 0.1 K/uL  Comprehensive metabolic panel     Status: Abnormal   Collection Time: 11/15/16  9:32 AM  Result Value Ref Range   Sodium 139 135 - 145 mmol/L   Potassium 3.8 3.5 - 5.1 mmol/L   Chloride 100 (L) 101 - 111 mmol/L   CO2 25 22 - 32 mmol/L   Glucose, Bld 94 65 - 99 mg/dL   BUN 58 (H) 6 - 20 mg/dL   Creatinine, Ser 7.09 (H) 0.61 - 1.24 mg/dL   Calcium 8.9 8.9 - 10.3 mg/dL   Total Protein 7.0 6.5 - 8.1 g/dL   Albumin 3.5 3.5 - 5.0 g/dL   AST 23 15 - 41 U/L   ALT 13 (L) 17 - 63 U/L   Alkaline Phosphatase 98 38 - 126 U/L   Total Bilirubin 0.5 0.3 - 1.2 mg/dL   GFR calc non Af Amer 6 (L) >60 mL/min   GFR calc Af Amer 7 (L) >60 mL/min    Comment: (NOTE) The eGFR has been calculated using the CKD EPI equation. This calculation has not been  validated in all clinical situations. eGFR's persistently <60 mL/min signify possible Chronic Kidney Disease.    Anion gap 14 5 - 15  Troponin I     Status: Abnormal   Collection Time: 11/15/16  9:32 AM  Result Value Ref Range   Troponin I 0.06 (HH) <0.03 ng/mL    Comment: CRITICAL RESULT CALLED TO, READ BACK BY AND VERIFIED WITH ANGELA ROBBINS AT 1012 ON 11/15/16 [place].   Procalcitonin - Baseline     Status: None   Collection Time: 11/15/16 11:36 AM  Result Value Ref Range   Procalcitonin  ng/mL    PATIENT IDENTIFICATION ERROR. PLEASE DISREGARD RESULTS. ACCOUNT WILL BE CREDITED.    Comment: C/BRITTON LEE RUSTCHESTER 11/15/16 2218 PMH Interpretation: PCT (Procalcitonin) <= 0.5 ng/mL: Systemic infection (sepsis) is not likely. Local bacterial infection is possible. (NOTE)         ICU PCT Algorithm               Non ICU PCT Algorithm    ----------------------------      ------------------------------         PCT < 0.25 ng/mL                 PCT < 0.1 ng/mL     Stopping of antibiotics            Stopping of antibiotics       strongly encouraged.               strongly encouraged.    ----------------------------     ------------------------------       PCT level decrease by               PCT < 0.25 ng/mL       >= 80% from peak PCT       OR PCT 0.25 - 0.5 ng/mL          Stopping of antibiotics                                             encouraged.     Stopping of antibiotics           encouraged.    ----------------------------     ------------------------------       PCT level decrease by              PCT >= 0.25 ng/mL       < 80% from peak PCT        AND  PCT >= 0.5 ng/mL            Continuing antibiotics                                              encouraged.       Continuing antibiotics            encouraged.    ----------------------------     ------------------------------     PCT level increase compared          PCT > 0.5 ng/mL         with peak PCT AND          PCT >= 0.5 ng/mL  Escalation of antibiotics                                          strongly encouraged.      Escalation of antibiotics        strongly encouraged. CORRECTED ON 08/07 AT 1014: PREVIOUSLY REPORTED AS <0.10        Interpretation: PCT (Procalcitonin) <= 0.5 ng/mL: Systemic infection (sepsis) is not likely. Local bacterial infection is possible.   Blood culture (routine x 2)     Status: None   Collection Time: 11/15/16 11:45 AM  Result Value Ref Range   Specimen Description BLOOD BLOOD RIGHT WRIST    Special Requests      BOTTLES DRAWN AEROBIC AND ANAEROBIC Blood Culture adequate volume   Culture NO GROWTH 5 DAYS    Report Status 11/20/2016 FINAL   Lactic acid, plasma     Status: None   Collection Time: 11/15/16 11:45 AM  Result Value Ref Range   Lactic Acid, Venous 0.5 0.5 - 1.9 mmol/L  Glucose, capillary     Status: Abnormal   Collection Time:  11/15/16 12:10 PM  Result Value Ref Range   Glucose-Capillary 109 (H) 65 - 99 mg/dL   Comment 1 Notify RN   MRSA PCR Screening     Status: None   Collection Time: 11/15/16 12:38 PM  Result Value Ref Range   MRSA by PCR NEGATIVE NEGATIVE    Comment:        The GeneXpert MRSA Assay (FDA approved for NASAL specimens only), is one component of a comprehensive MRSA colonization surveillance program. It is not intended to diagnose MRSA infection nor to guide or monitor treatment for MRSA infections.   Glucose, capillary     Status: Abnormal   Collection Time: 11/15/16  5:09 PM  Result Value Ref Range   Glucose-Capillary 109 (H) 65 - 99 mg/dL   Comment 1 Notify RN   Glucose, capillary     Status: None   Collection Time: 11/15/16  8:30 PM  Result Value Ref Range   Glucose-Capillary 87 65 - 99 mg/dL  Hemoglobin A1c     Status: Abnormal   Collection Time: 11/16/16 12:48 AM  Result Value Ref Range   Hgb A1c MFr Bld 6.1 (H) 4.8 - 5.6 %    Comment: (NOTE)         Pre-diabetes: 5.7 - 6.4         Diabetes: >6.4         Glycemic control for adults with diabetes: <7.0    Mean Plasma Glucose 128 mg/dL    Comment: (NOTE) Performed At: [facility], [place] [id number] Lindon Romp MD [id number]   CBC     Status: Abnormal   Collection Time: 11/16/16 12:48 AM  Result Value Ref Range   WBC 4.6 3.8 - 10.6 K/uL   RBC 3.14 (L) 4.40 - 5.90 MIL/uL   Hemoglobin 10.0 (L) 13.0 - 18.0 g/dL   HCT 30.3 (L) 40.0 - 52.0 %   MCV 96.6 80.0 - 100.0 fL   MCH 31.9 26.0 - 34.0 pg   MCHC 33.0 32.0 - 36.0 g/dL   RDW 15.0 (H) 11.5 - 14.5 %   Platelets 187 150 - 440 K/uL  Comprehensive metabolic panel     Status: Abnormal   Collection Time: 11/16/16 12:48 AM  Result Value  Ref Range   Sodium 139 135 - 145 mmol/L   Potassium 4.3 3.5 - 5.1 mmol/L   Chloride 105 101 - 111 mmol/L   CO2 22 22 - 32 mmol/L   Glucose, Bld 78 65 - 99 mg/dL   BUN 60 (H) 6 - 20 mg/dL    Creatinine, Ser 7.30 (H) 0.61 - 1.24 mg/dL   Calcium 8.4 (L) 8.9 - 10.3 mg/dL   Total Protein 6.6 6.5 - 8.1 g/dL   Albumin 3.2 (L) 3.5 - 5.0 g/dL   AST 20 15 - 41 U/L   ALT 13 (L) 17 - 63 U/L   Alkaline Phosphatase 91 38 - 126 U/L   Total Bilirubin 0.5 0.3 - 1.2 mg/dL   GFR calc non Af Amer 6 (L) >60 mL/min   GFR calc Af Amer 7 (L) >60 mL/min    Comment: (NOTE) The eGFR has been calculated using the CKD EPI equation. This calculation has not been validated in all clinical situations. eGFR's persistently <60 mL/min signify possible Chronic Kidney Disease.    Anion gap 12 5 - 15  Procalcitonin     Status: None   Collection Time: 11/16/16 12:48 AM  Result Value Ref Range   Procalcitonin 0.14 ng/mL    Comment:        Interpretation: PCT (Procalcitonin) <= 0.5 ng/mL: Systemic infection (sepsis) is not likely. Local bacterial infection is possible. (NOTE)         ICU PCT Algorithm               Non ICU PCT Algorithm    ----------------------------     ------------------------------         PCT < 0.25 ng/mL                 PCT < 0.1 ng/mL     Stopping of antibiotics            Stopping of antibiotics       strongly encouraged.               strongly encouraged.    ----------------------------     ------------------------------       PCT level decrease by               PCT < 0.25 ng/mL       >= 80% from peak PCT       OR PCT 0.25 - 0.5 ng/mL          Stopping of antibiotics                                             encouraged.     Stopping of antibiotics           encouraged.    ----------------------------     ------------------------------       PCT level decrease by              PCT >= 0.25 ng/mL       < 80% from peak PCT        AND PCT >= 0.5 ng/mL            Continuin g antibiotics  encouraged.       Continuing antibiotics            encouraged.    ----------------------------     ------------------------------     PCT level  increase compared          PCT > 0.5 ng/mL         with peak PCT AND          PCT >= 0.5 ng/mL             Escalation of antibiotics                                          strongly encouraged.      Escalation of antibiotics        strongly encouraged.   Ammonia     Status: None   Collection Time: 11/16/16 12:48 AM  Result Value Ref Range   Ammonia 14 9 - 35 umol/L  Magnesium     Status: None   Collection Time: 11/16/16 12:48 AM  Result Value Ref Range   Magnesium 2.0 1.7 - 2.4 mg/dL  Phosphorus     Status: Abnormal   Collection Time: 11/16/16 12:48 AM  Result Value Ref Range   Phosphorus 4.8 (H) 2.5 - 4.6 mg/dL  Glucose, capillary     Status: None   Collection Time: 11/16/16 12:49 AM  Result Value Ref Range   Glucose-Capillary 81 65 - 99 mg/dL  Glucose, capillary     Status: Abnormal   Collection Time: 11/16/16  4:27 AM  Result Value Ref Range   Glucose-Capillary 215 (H) 65 - 99 mg/dL   Comment 1 Notify RN    Comment 2 Document in Chart   Glucose, capillary     Status: None   Collection Time: 11/16/16  7:52 AM  Result Value Ref Range   Glucose-Capillary 71 65 - 99 mg/dL  Glucose, capillary     Status: None   Collection Time: 11/16/16 11:45 AM  Result Value Ref Range   Glucose-Capillary 94 65 - 99 mg/dL  Glucose, capillary     Status: None   Collection Time: 11/16/16 12:08 PM  Result Value Ref Range   Glucose-Capillary 84 65 - 99 mg/dL  Glucose, capillary     Status: Abnormal   Collection Time: 11/16/16  4:59 PM  Result Value Ref Range   Glucose-Capillary 155 (H) 65 - 99 mg/dL  Glucose, capillary     Status: Abnormal   Collection Time: 11/16/16  9:36 PM  Result Value Ref Range   Glucose-Capillary 164 (H) 65 - 99 mg/dL  Basic metabolic panel     Status: Abnormal   Collection Time: 11/17/16  4:14 AM  Result Value Ref Range   Sodium 138 135 - 145 mmol/L   Potassium 4.6 3.5 - 5.1 mmol/L   Chloride 104 101 - 111 mmol/L   CO2 23 22 - 32 mmol/L   Glucose, Bld 116 (H)  65 - 99 mg/dL   BUN 81 (H) 6 - 20 mg/dL   Creatinine, Ser 8.92 (H) 0.61 - 1.24 mg/dL   Calcium 8.1 (L) 8.9 - 10.3 mg/dL   GFR calc non Af Amer 5 (L) >60 mL/min   GFR calc Af Amer 5 (L) >60 mL/min    Comment: (NOTE) The eGFR has been calculated using the CKD EPI equation. This calculation has not been validated in  all clinical situations. eGFR's persistently <60 mL/min signify possible Chronic Kidney Disease.    Anion gap 11 5 - 15  Glucose, capillary     Status: None   Collection Time: 11/17/16  8:01 AM  Result Value Ref Range   Glucose-Capillary 80 65 - 99 mg/dL  Phosphorus     Status: Abnormal   Collection Time: 11/17/16 10:52 AM  Result Value Ref Range   Phosphorus 5.0 (H) 2.5 - 4.6 mg/dL  Hepatitis B surface antigen     Status: None   Collection Time: 11/17/16 11:30 AM  Result Value Ref Range   Hepatitis B Surface Ag Negative Negative    Comment: (NOTE) Performed At: [facility] [place], [place] [id number] Lindon Romp MD [id number]   Hepatitis B surface antibody     Status: Abnormal   Collection Time: 11/17/16 11:30 AM  Result Value Ref Range   Hepatitis B-Post 3.8 (L) Immunity>9.9 mIU/mL    Comment: (NOTE)  Status of Immunity                     Anti-HBs Level  ------------------                     -------------- Inconsistent with Immunity                   0.0 - 9.9 Consistent with Immunity                          >9.9 Performed At: [facility] [place], [place] [id number] Lindon Romp MD [id number]   Glucose, capillary     Status: Abnormal   Collection Time: 11/17/16  4:52 PM  Result Value Ref Range   Glucose-Capillary 165 (H) 65 - 99 mg/dL   Comment 1 Notify RN   Glucose, capillary     Status: None   Collection Time: 11/17/16  9:08 PM  Result Value Ref Range   Glucose-Capillary 65 65 - 99 mg/dL  Glucose, capillary     Status: None   Collection Time: 11/17/16  9:24 PM  Result Value Ref Range     Glucose-Capillary 78 65 - 99 mg/dL  CBC     Status: Abnormal   Collection Time: 11/18/16  5:05 AM  Result Value Ref Range   WBC 4.7 3.8 - 10.6 K/uL   RBC 2.95 (L) 4.40 - 5.90 MIL/uL   Hemoglobin 9.4 (L) 13.0 - 18.0 g/dL   HCT 28.3 (L) 40.0 - 52.0 %   MCV 95.9 80.0 - 100.0 fL   MCH 31.8 26.0 - 34.0 pg   MCHC 33.2 32.0 - 36.0 g/dL   RDW 14.8 (H) 11.5 - 14.5 %   Platelets 187 150 - 440 K/uL  Glucose, capillary     Status: None   Collection Time: 11/18/16  7:55 AM  Result Value Ref Range   Glucose-Capillary 70 65 - 99 mg/dL   Comment 1 Notify RN   Renal function panel     Status: Abnormal   Collection Time: 11/18/16 10:05 AM  Result Value Ref Range   Sodium 138 135 - 145 mmol/L   Potassium 3.8 3.5 - 5.1 mmol/L   Chloride 100 (L) 101 - 111 mmol/L   CO2 27 22 - 32 mmol/L   Glucose, Bld 143 (H) 65 - 99 mg/dL   BUN 38 (H) 6 - 20 mg/dL   Creatinine, Ser  5.49 (H) 0.61 - 1.24 mg/dL   Calcium 8.3 (L) 8.9 - 10.3 mg/dL   Phosphorus 2.7 2.5 - 4.6 mg/dL   Albumin 3.1 (L) 3.5 - 5.0 g/dL   GFR calc non Af Amer 8 (L) >60 mL/min   GFR calc Af Amer 10 (L) >60 mL/min    Comment: (NOTE) The eGFR has been calculated using the CKD EPI equation. This calculation has not been validated in all clinical situations. eGFR's persistently <60 mL/min signify possible Chronic Kidney Disease.    Anion gap 11 5 - 15  Glucose, capillary     Status: Abnormal   Collection Time: 11/18/16  4:40 PM  Result Value Ref Range   Glucose-Capillary 147 (H) 65 - 99 mg/dL  Glucose, capillary     Status: Abnormal   Collection Time: 11/18/16 10:15 PM  Result Value Ref Range   Glucose-Capillary 132 (H) 65 - 99 mg/dL  Hemoglobin     Status: Abnormal   Collection Time: 11/19/16  5:47 AM  Result Value Ref Range   Hemoglobin 9.3 (L) 13.0 - 18.0 g/dL  Glucose, capillary     Status: None   Collection Time: 11/19/16  7:47 AM  Result Value Ref Range   Glucose-Capillary 89 65 - 99 mg/dL  Glucose, capillary     Status:  Abnormal   Collection Time: 11/19/16 11:52 AM  Result Value Ref Range   Glucose-Capillary 161 (H) 65 - 99 mg/dL   Comment 1 Notify RN     Radiology Ct Head Wo Contrast  Result Date: 11/15/2016 CLINICAL DATA:  Altered level of consciousness. EXAM: CT HEAD WITHOUT CONTRAST TECHNIQUE: Contiguous axial images were obtained from the base of the skull through the vertex without intravenous contrast. COMPARISON:  04/25/2013 FINDINGS: Brain: There is atrophy and chronic small vessel disease changes. Bilateral basal ganglia and thalamic lacunar infarcts, stable. Old right occipital infarct. No acute intracranial abnormality. Specifically, no hemorrhage, hydrocephalus, mass lesion, acute infarction, or significant intracranial injury. Vascular: No hyperdense vessel or unexpected calcification. Skull: No acute calvarial abnormality. Sinuses/Orbits: Visualized paranasal sinuses and mastoids clear. Orbital soft tissues unremarkable. Other: None IMPRESSION: No acute intracranial abnormality. Atrophy, chronic microvascular disease. 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 Electronically Signed   By: Rolm Baptise M.D.   On: 11/15/2016 09:15   Dg Chest Portable 1 View  Result Date: 11/15/2016 CLINICAL DATA:  Patient found unresponsive. Hypertension. Renal failure. EXAM: PORTABLE CHEST 1 VIEW COMPARISON:  September 24, 2016 FINDINGS: There is no edema or consolidation. Heart is borderline prominent with pulmonary vascularity within normal limits. No adenopathy. There is aortic atherosclerosis. There is a brachials stent on the left. There is degenerative change in each shoulder. IMPRESSION: Heart borderline prominent. Aortic atherosclerosis. No edema or consolidation. Aortic Atherosclerosis (ICD10-I70.0). Electronically Signed   By: Lowella Grip III M.D.   On: 11/15/2016 09:07     Assessment/Plan IDDM (insulin dependent diabetes mellitus) (HCC) blood glucose control  important in reducing the progression of atherosclerotic disease. Also, involved in wound healing. On appropriate medications.   ESRD on hemodialysis [facility]) This is an independent risk factor for poor wound healing and also increases likelihood of significant peripheral arterial disease. Should continue his dialysis schedule.  Dementia Difficult situation as he may not be able to avoid pressure and other issues.  Atherosclerosis of native arteries of the extremities with gangrene ([place]) His perfusion appears improved and his gangrenous changes are improved as well. He does have an ulceration  on the tip of the toe, but no surgery is currently necessary to resect this. We will plan to recheck him with ABIs in 1 month.    Leotis Pain, MD  11/26/2016 4:34 PM    This note was created with Dragon medical transcription system.  Any errors from dictation are purely unintentional

## 2016-11-29 ENCOUNTER — Encounter (INDEPENDENT_AMBULATORY_CARE_PROVIDER_SITE_OTHER): Payer: Self-pay | Admitting: Vascular Surgery

## 2017-01-02 ENCOUNTER — Emergency Department: Payer: Medicare Other

## 2017-01-02 ENCOUNTER — Other Ambulatory Visit: Payer: Self-pay

## 2017-01-02 ENCOUNTER — Encounter: Payer: Self-pay | Admitting: Internal Medicine

## 2017-01-02 ENCOUNTER — Inpatient Hospital Stay
Admission: EM | Admit: 2017-01-02 | Discharge: 2017-01-05 | DRG: 314 | Disposition: A | Discharge status: Skilled Nursing Facility | Payer: Medicare Other | Attending: Specialist | Admitting: Specialist

## 2017-01-02 DIAGNOSIS — Z8673 Personal history of transient ischemic attack (TIA), and cerebral infarction without residual deficits: Secondary | ICD-10-CM

## 2017-01-02 DIAGNOSIS — D631 Anemia in chronic kidney disease: Secondary | ICD-10-CM | POA: Diagnosis present

## 2017-01-02 DIAGNOSIS — E1122 Type 2 diabetes mellitus with diabetic chronic kidney disease: Secondary | ICD-10-CM | POA: Diagnosis present

## 2017-01-02 DIAGNOSIS — E119 Type 2 diabetes mellitus without complications: Secondary | ICD-10-CM

## 2017-01-02 DIAGNOSIS — E1142 Type 2 diabetes mellitus with diabetic polyneuropathy: Secondary | ICD-10-CM | POA: Diagnosis present

## 2017-01-02 DIAGNOSIS — IMO0001 Reserved for inherently not codable concepts without codable children: Secondary | ICD-10-CM

## 2017-01-02 DIAGNOSIS — I214 Non-ST elevation (NSTEMI) myocardial infarction: Secondary | ICD-10-CM | POA: Diagnosis present

## 2017-01-02 DIAGNOSIS — I2510 Atherosclerotic heart disease of native coronary artery without angina pectoris: Secondary | ICD-10-CM | POA: Diagnosis present

## 2017-01-02 DIAGNOSIS — F0390 Unspecified dementia without behavioral disturbance: Secondary | ICD-10-CM | POA: Diagnosis present

## 2017-01-02 DIAGNOSIS — R4189 Other symptoms and signs involving cognitive functions and awareness: Secondary | ICD-10-CM

## 2017-01-02 DIAGNOSIS — E1151 Type 2 diabetes mellitus with diabetic peripheral angiopathy without gangrene: Secondary | ICD-10-CM | POA: Diagnosis present

## 2017-01-02 DIAGNOSIS — Z992 Dependence on renal dialysis: Secondary | ICD-10-CM

## 2017-01-02 DIAGNOSIS — Z8546 Personal history of malignant neoplasm of prostate: Secondary | ICD-10-CM

## 2017-01-02 DIAGNOSIS — N186 End stage renal disease: Secondary | ICD-10-CM

## 2017-01-02 DIAGNOSIS — I9589 Other hypotension: Principal | ICD-10-CM | POA: Diagnosis present

## 2017-01-02 DIAGNOSIS — Z794 Long term (current) use of insulin: Secondary | ICD-10-CM

## 2017-01-02 DIAGNOSIS — R4182 Altered mental status, unspecified: Secondary | ICD-10-CM | POA: Diagnosis not present

## 2017-01-02 DIAGNOSIS — E785 Hyperlipidemia, unspecified: Secondary | ICD-10-CM | POA: Diagnosis present

## 2017-01-02 DIAGNOSIS — F0150 Vascular dementia without behavioral disturbance: Secondary | ICD-10-CM | POA: Diagnosis present

## 2017-01-02 DIAGNOSIS — Z809 Family history of malignant neoplasm, unspecified: Secondary | ICD-10-CM

## 2017-01-02 DIAGNOSIS — K219 Gastro-esophageal reflux disease without esophagitis: Secondary | ICD-10-CM | POA: Diagnosis present

## 2017-01-02 DIAGNOSIS — G40909 Epilepsy, unspecified, not intractable, without status epilepticus: Secondary | ICD-10-CM | POA: Diagnosis present

## 2017-01-02 DIAGNOSIS — I10 Essential (primary) hypertension: Secondary | ICD-10-CM | POA: Diagnosis present

## 2017-01-02 DIAGNOSIS — Z880 Allergy status to penicillin: Secondary | ICD-10-CM

## 2017-01-02 DIAGNOSIS — Z888 Allergy status to other drugs, medicaments and biological substances status: Secondary | ICD-10-CM

## 2017-01-02 DIAGNOSIS — L97518 Non-pressure chronic ulcer of other part of right foot with other specified severity: Secondary | ICD-10-CM | POA: Diagnosis present

## 2017-01-02 DIAGNOSIS — I120 Hypertensive chronic kidney disease with stage 5 chronic kidney disease or end stage renal disease: Secondary | ICD-10-CM | POA: Diagnosis present

## 2017-01-02 DIAGNOSIS — E11621 Type 2 diabetes mellitus with foot ulcer: Secondary | ICD-10-CM | POA: Diagnosis present

## 2017-01-02 DIAGNOSIS — Z886 Allergy status to analgesic agent status: Secondary | ICD-10-CM

## 2017-01-02 DIAGNOSIS — I248 Other forms of acute ischemic heart disease: Secondary | ICD-10-CM | POA: Diagnosis present

## 2017-01-02 DIAGNOSIS — Z96642 Presence of left artificial hip joint: Secondary | ICD-10-CM | POA: Diagnosis present

## 2017-01-02 DIAGNOSIS — N2581 Secondary hyperparathyroidism of renal origin: Secondary | ICD-10-CM | POA: Diagnosis present

## 2017-01-02 DIAGNOSIS — Z66 Do not resuscitate: Secondary | ICD-10-CM | POA: Diagnosis present

## 2017-01-02 HISTORY — DX: Other specified abnormal findings of blood chemistry: R79.89

## 2017-01-02 HISTORY — DX: Bacteremia: B96.20

## 2017-01-02 HISTORY — DX: Other specified abnormalities of plasma proteins: R77.8

## 2017-01-02 HISTORY — DX: Bacteremia: R78.81

## 2017-01-02 LAB — COMPREHENSIVE METABOLIC PANEL
ALT: 25 U/L (ref 17–63)
AST: 27 U/L (ref 15–41)
Albumin: 4 g/dL (ref 3.5–5.0)
Alkaline Phosphatase: 123 U/L (ref 38–126)
Anion gap: 17 — ABNORMAL HIGH (ref 5–15)
BUN: 80 mg/dL — AB (ref 6–20)
CHLORIDE: 96 mmol/L — AB (ref 101–111)
CO2: 23 mmol/L (ref 22–32)
CREATININE: 7.79 mg/dL — AB (ref 0.61–1.24)
Calcium: 8.9 mg/dL (ref 8.9–10.3)
GFR calc Af Amer: 6 mL/min — ABNORMAL LOW (ref >60)
GFR calc non Af Amer: 6 mL/min — ABNORMAL LOW (ref >60)
Glucose, Bld: 192 mg/dL — ABNORMAL HIGH (ref 65–99)
POTASSIUM: 4 mmol/L (ref 3.5–5.1)
SODIUM: 136 mmol/L (ref 135–145)
Total Bilirubin: 0.8 mg/dL (ref 0.3–1.2)
Total Protein: 7.8 g/dL (ref 6.5–8.1)

## 2017-01-02 LAB — CBC
HCT: 39.3 % — ABNORMAL LOW (ref 40.0–52.0)
HEMOGLOBIN: 13.1 g/dL (ref 13.0–18.0)
MCH: 31.8 pg (ref 26.0–34.0)
MCHC: 33.3 g/dL (ref 32.0–36.0)
MCV: 95.6 fL (ref 80.0–100.0)
PLATELETS: 156 10*3/uL (ref 150–440)
RBC: 4.11 MIL/uL — AB (ref 4.40–5.90)
RDW: 15.5 % — ABNORMAL HIGH (ref 11.5–14.5)
WBC: 10.7 10*3/uL — AB (ref 3.8–10.6)

## 2017-01-02 LAB — GLUCOSE, CAPILLARY: GLUCOSE-CAPILLARY: 177 mg/dL — AB (ref 65–99)

## 2017-01-02 LAB — AMMONIA

## 2017-01-02 LAB — TROPONIN I: Troponin I: 1.54 ng/mL (ref <0.03)

## 2017-01-02 MED ORDER — ASPIRIN EC 325 MG PO TBEC
325.0000 mg | DELAYED_RELEASE_TABLET | Freq: Once | ORAL | Status: AC
  Administered 2017-01-03: 325 mg via ORAL
  Filled 2017-01-02: qty 1

## 2017-01-02 NOTE — ED Triage Notes (Addendum)
Pt arrives via [facility], who were called out to [facility] for pt when he became unresponsive after eating dinner. Per EMS, LPN at facility said she "thinks he went to dialysis today." CBG 186 for EMS. VSS currently. Pt alert and oriented to self.

## 2017-01-02 NOTE — ED Notes (Signed)
ED Provider at bedside. 

## 2017-01-02 NOTE — ED Provider Notes (Signed)
[facility] Emergency Department Provider Note  ____________________________________________  Time seen: Approximately 7:57 PM  I have reviewed the triage vital signs and the nursing notes.   HISTORY  Chief Complaint Altered Mental Status  The patient's history is limited due to his dementia and altered mental status.  HPI Michael Acosta is a 83 y.o. male with a history of encephalopathy, dementia, ESRD on HD, presenting for unresponsiveness and hypotension.The patient reports "I don't need to be here. I am fine. I want to go home." Per EMS report, they were called due to unresponsiveness. On arrival, he was regaining normal mental status did have a blood pressure systolically in the 94B. His blood sugar was 185. He was oxygenating normally and had a normal pulse. The patient denies any pain at this time. On arrival to the ED he was slightly agitated but able to be coaxed with verbal encouragement. Patient had full dialysis yesterday.   Past Medical History:  Diagnosis Date  . Anemia of chronic disease   . Chronic pain disorder   . Dementia   . Depression   . ESRD on hemodialysis ([place])    a. MWF  . Falls frequently   . GERD (gastroesophageal reflux disease)   . Hypertension   . Insulin dependent diabetes mellitus with complications ([place])   . Mood disorder ([place])   . Peripheral neuropathy    a. diabetic neuropathy  . Peripheral vascular disease ([place])    a. s/p prior toe amputation; b. followed by [facility]  . Prostate cancer ([place])   . Seizures ([place])    EPILEPSY  . Stroke [facility])    TIA  . Weakness generalized     Patient Active Problem List   Diagnosis Date Noted  . Hematochezia   . Acute encephalopathy   . Atherosclerosis of native arteries of the extremities with gangrene ([place]) 11/05/2016  . Acute gallstone pancreatitis 10/04/2016  . Weakness generalized   . Cholecystitis 09/25/2016  . Elevated troponin 09/25/2016  . ESRD on hemodialysis ([place])  09/25/2016  . Dementia 09/25/2016  . PVD (peripheral vascular disease) ([place]) 09/25/2016  . IDDM (insulin dependent diabetes mellitus) ([place]) 09/25/2016  . Bacteremia 09/25/2016  . DNR (do not resuscitate) discussion 09/25/2016  . Palliative care by specialist 09/25/2016  . Sepsis ([place])   . Septic shock ([place]) 09/24/2016  . CAP (community acquired pneumonia) 04/13/2016    Past Surgical History:  Procedure Laterality Date  . COLON SURGERY     RESECTION  . INSERTION OF DIALYSIS CATHETER     SHUNT  . LEG SURGERY Left    MULTIPLE VEIN SURGERIES  . LOWER EXTREMITY ANGIOGRAPHY Right 11/07/2016   Procedure: Lower Extremity Angiography;  Surgeon: Algernon Huxley, MD;  Location: [place] CV LAB;  Service: Cardiovascular;  Laterality: Right;  . TOTAL HIP ARTHROPLASTY Left     Current Outpatient Rx  . Order #: [id number] Class: Historical Med  . Order #: [id number] Class: Historical Med  . Order #: [id number] Class: Historical Med  . Order #: [id number] Class: Historical Med  . Order #: [id number] Class: Historical Med  . Order #: [id number] Class: Historical Med  . Order #: [id number] Class: Historical Med  . Order #: [id number] Class: Historical Med  . Order #: [id number] Class: Historical Med  . Order #: [id number] Class: Normal  . Order #: [id number] Class: Historical Med  . Order #: [id number] Class: Historical Med  . Order #: [id number] Class: Historical Med  . Order #: [id number] Class: Historical Med    Allergies Aspirin; Bc powder [aspirin-salicylamide-caffeine]; Caffeine; Penicillins;  and Ramipril  Family History  Problem Relation Age of Onset  . CAD Neg Hx   . Diabetes Neg Hx     Social History Social History  Substance Use Topics  . Smoking status: Never Smoker  . Smokeless tobacco: Never Used  . Alcohol use No    Review of Systems Limited due to the patient's dementia and altered mental status per Constitutional: No fever/chills.No lightheadedness. Positive unresponsiveness. Eyes:  No visual changes. ENT:  No congestion or rhinorrhea. Cardiovascular: Denies chest pain. Denies palpitations. Respiratory: Denies shortness of breath.  No cough. Gastrointestinal: No abdominal pain.  No nausea, no vomiting.  No diarrhea.  No constipation. Genitourinary: ESRD on HD  Musculoskeletal: Negative for back pain. Skin: Negative for rash. Neurological: Negative for headaches. No focal numbness, tingling or weakness. No visual or speech changes. Positive confusion but it is unclear whether this is baseline.    ____________________________________________   PHYSICAL EXAM:  VITAL SIGNS: ED Triage Vitals  Enc Vitals Group     BP 01/02/17 1944 122/70     Pulse Rate 01/02/17 1944 87     Resp 01/02/17 1944 20     Temp 01/02/17 1944 97.8 F (36.6 C)     Temp Source 01/02/17 1944 Axillary     SpO2 --      Weight 01/02/17 1945 170 lb (77.1 kg)     Height 01/02/17 1945 5\' 11"  (1.803 m)     Head Circumference --      Peak Flow --      Pain Score --      Pain Loc --      Pain Edu? --      Excl. in [place]? --     Constitutional: The patient is alert and able to answer simple questions. GCS is 15. Eyes: Conjunctivae are normal.  EOMI. PERRLA. No scleral icterus. Head: Atraumatic. Nose: No congestion/rhinnorhea. Mouth/Throat: Mucous membranes are moist.  Neck: No stridor.  Supple.  No JVD. No meningismus. Cardiovascular: Normal rate, regular rhythm. No murmurs, rubs or gallops.  Respiratory: Normal respiratory effort.  No accessory muscle use or retractions. Lungs CTAB.  No wheezes, rales or ronchi. Gastrointestinal: Soft, nontender and nondistended.  No guarding or rebound.  No peritoneal signs. Musculoskeletal: Multiple scars from prior surgeries diffusely in the lower extremity sprain No LE edema. No ttp in the calves or palpable cords.  Negative Homan's sign. Vascular: Left upper extremity fistula with normal thrill. Neurologic:  alert to person but thinks it is 2017, does not  know the month, and is unclear about where he is.  Speech is slurred.  Face and smile are symmetric.  EOMI.  Moves all extremities well. Skin:  Skin is warm, dry and intact. No rash noted. Psychiatric: Intermittent agitation that can be coaxed  ____________________________________________   LABS (all labs ordered are listed, but only abnormal results are displayed)  Labs Reviewed  CBC - Abnormal; Notable for the following:       Result Value   WBC 10.7 (*)    RBC 4.11 (*)    HCT 39.3 (*)    RDW 15.5 (*)    All other components within normal limits  AMMONIA - Abnormal; Notable for the following:    Ammonia <9 (*)    All other components within normal limits  COMPREHENSIVE METABOLIC PANEL - Abnormal; Notable for the following:    Chloride 96 (*)    Glucose, Bld 192 (*)    BUN 80 (*)  Creatinine, Ser 7.79 (*)    GFR calc non Af Amer 6 (*)    GFR calc Af Amer 6 (*)    Anion gap 17 (*)    All other components within normal limits  TROPONIN I - Abnormal; Notable for the following:    Troponin I 1.54 (*)    All other components within normal limits  GLUCOSE, CAPILLARY - Abnormal; Notable for the following:    Glucose-Capillary 177 (*)    All other components within normal limits  TROPONIN I   ____________________________________________  EKG  ED ECG REPORT I, Eula Listen, the attending physician, personally viewed and interpreted this ECG.   Date: 01/02/2017  EKG Time: 1948  Rate: 85  Rhythm: normal sinus rhythm  Axis: normal  Intervals:first-degree A-V block   ST&T Change: No STEMI  ____________________________________________  RADIOLOGY  Dg Chest 2 View  Result Date: 01/02/2017 CLINICAL DATA:  Unresponsive. EXAM: CHEST  2 VIEW COMPARISON:  Radiograph of November 15, 2016. FINDINGS: Stable cardiomediastinal silhouette. Atherosclerosis of thoracic aorta is noted. No pneumothorax is noted. Right lung is clear. Elevated left hemidiaphragm is noted with mild  left basilar subsegmental atelectasis. Minimal left pleural effusion is noted. Bony thorax is unremarkable. IMPRESSION: Aortic atherosclerosis. Elevated left hemidiaphragm with mild left basilar subsegmental atelectasis. Minimal left pleural effusion is noted. Electronically Signed   By: Marijo Conception, M.D.   On: 01/02/2017 20:35   Ct Head Wo Contrast  Result Date: 01/02/2017 CLINICAL DATA:  Altered level of consciousness. EXAM: CT HEAD WITHOUT CONTRAST TECHNIQUE: Contiguous axial images were obtained from the base of the skull through the vertex without intravenous contrast. COMPARISON:  CT scan of November 15, 2016. FINDINGS: Brain: Mild diffuse cortical atrophy is noted. Mild chronic ischemic white matter disease is noted. Old basal ganglia lacunar infarctions are noted bilaterally. No mass effect or midline shift is noted. Ventricular size is within normal limits. There is no evidence of mass lesion, hemorrhage or acute infarction. Old right occipital infarction is noted. Vascular: No hyperdense vessel or unexpected calcification. Skull: Normal. Negative for fracture or focal lesion. Sinuses/Orbits: No acute finding. Other: None. IMPRESSION: Mild diffuse cortical atrophy. Mild chronic ischemic white matter disease. No acute intracranial abnormality seen. Electronically Signed   By: Marijo Conception, M.D.   On: 01/02/2017 20:21    ____________________________________________   PROCEDURES  Procedure(s) performed: None  Procedures  Critical Care performed: Yes ____________________________________________   INITIAL IMPRESSION / ASSESSMENT AND PLAN / ED COURSE  Pertinent labs & imaging results that were available during my care of the patient were reviewed by me and considered in my medical decision making (see chart for details).  83 y.o. male with multiple chronic comorbidities including dementia brought to the emergency department for an episode of unresponsiveness, now resolved, with  associated hypotension. The patient has had normal blood pressures here without any intervention. Given his renal dysfunction, I will hold off on giving him intravenous fluids unless his blood pressure drops again. He gives a negative review of symptoms for infectious etiology, but we will evaluate for pneumonia by chest x-ray. He is afebrile here. We'll get basic labs, as well as a troponin. Plan reevaluation for final disposition. We will also attempt to reach his family to obtain a baseline.  ----------------------------------------- 10:45 PM on 01/02/2017 -----------------------------------------  The patient has been hemodynamically stable in the emergency department and has been chest pain-free. However, he does have an elevated troponin of 1.54. He has had a history  of bleeding with aspirin before, but the risk of bleeding from one dose is lower than the benefit of cardioprotection so he will get aspirin and be admitted to the hospital for further evaluation and treatment.  CRITICAL CARE Performed by: Eula Listen   Total critical care time: 40 minutes  Critical care time was exclusive of separately billable procedures and treating other patients.  Critical care was necessary to treat or prevent imminent or life-threatening deterioration.  Critical care was time spent personally by me on the following activities: development of treatment plan with patient and/or surrogate as well as nursing, discussions with consultants, evaluation of patient's response to treatment, examination of patient, obtaining history from patient or surrogate, ordering and performing treatments and interventions, ordering and review of laboratory studies, ordering and review of radiographic studies, pulse oximetry and re-evaluation of patient's condition.    ____________________________________________  FINAL CLINICAL IMPRESSION(S) / ED DIAGNOSES  Final diagnoses:  Unresponsiveness  NSTEMI (non-ST  elevated myocardial infarction) ([place])         NEW MEDICATIONS STARTED DURING THIS VISIT:  New Prescriptions   No medications on file      Eula Listen, MD 01/02/17 2246

## 2017-01-02 NOTE — ED Notes (Signed)
Patient transported to CT 

## 2017-01-02 NOTE — H&P (Signed)
[place] [facility] [place] NAME: Michael Acosta    MR#:  [id number]  DATE OF BIRTH:  1930/02/12  DATE OF ADMISSION:  01/02/2017  PRIMARY CARE PHYSICIAN: Juluis Pitch, MD   REQUESTING/REFERRING PHYSICIAN: Mariea Clonts, MD  CHIEF COMPLAINT:   Chief Complaint  Patient presents with  . Altered Mental Status    HISTORY OF PRESENT ILLNESS:  Michael Acosta  is a 83 y.o. male who presents with Confusion after an unresponsive episode. Patient was found to have significantly low blood pressure. He is an end-stage renal disease patient on dialysis, and had a full dialysis session on the day prior to admission. Here in the ED he was found to have a significantly elevated troponin at 1.5. Patient does not complain of any chest pain or cardiac symptoms, though he is confused and unreliable in his history of present illness. Hospitals were called for admission  PAST MEDICAL HISTORY:   Past Medical History:  Diagnosis Date  . Anemia of chronic disease   . Chronic pain disorder   . Dementia   . Depression   . ESRD on hemodialysis ([place])    a. MWF  . Falls frequently   . GERD (gastroesophageal reflux disease)   . Hypertension   . Insulin dependent diabetes mellitus with complications ([place])   . Mood disorder ([place])   . Peripheral neuropathy    a. diabetic neuropathy  . Peripheral vascular disease ([place])    a. s/p prior toe amputation; b. followed by [facility]  . Prostate cancer ([place])   . Seizures ([place])    EPILEPSY  . Stroke [facility])    TIA  . Weakness generalized     PAST SURGICAL HISTORY:   Past Surgical History:  Procedure Laterality Date  . COLON SURGERY     RESECTION  . INSERTION OF DIALYSIS CATHETER     SHUNT  . LEG SURGERY Left    MULTIPLE VEIN SURGERIES  . LOWER EXTREMITY ANGIOGRAPHY Right 11/07/2016   Procedure: Lower Extremity Angiography;  Surgeon: Algernon Huxley, MD;  Location: [place] CV LAB;  Service: Cardiovascular;  Laterality:  Right;  . TOTAL HIP ARTHROPLASTY Left     SOCIAL HISTORY:   Social History  Substance Use Topics  . Smoking status: Never Smoker  . Smokeless tobacco: Never Used  . Alcohol use No    FAMILY HISTORY:   Family History  Problem Relation Age of Onset  . Cancer Father   . Cancer Sister   . CAD Neg Hx   . Diabetes Neg Hx     DRUG ALLERGIES:   Allergies  Allergen Reactions  . Aspirin Other (See Comments)    Bleeding   . Bc Powder [Aspirin-Salicylamide-Caffeine]     MAR did not specify a reaction  . Caffeine     MAR did not specify a reaction  . 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  . Ramipril Other (See Comments)    Imported from [facility] - reaction: unknown    MEDICATIONS AT HOME:   Prior to Admission  medications   Medication Sig Start Date End Date Taking? Authorizing Provider  Amino Acids-Protein Hydrolys (FEEDING SUPPLEMENT, PRO-STAT SUGAR FREE 64,) LIQD Take 30 mLs by mouth 2 (two) times daily.    [provider]  atorvastatin (LIPITOR) 20 MG tablet Take 20 mg by mouth at bedtime.     [provider]  docusate sodium (COLACE) 100 MG capsule Take 100 mg by mouth 2 (two) times daily as needed for mild constipation.    [provider]  gabapentin (NEURONTIN) 100 MG capsule Take 100 mg by mouth at bedtime.    [provider]  insulin aspart (NOVOLOG) 100 UNIT/ML injection Inject 0-10 Units into the skin See admin instructions. Three times a day before meals. At bedtime on  Sunday, Tuesday, Thursday, Saturday only. Dose per sliding scale.    [provider]  lamoTRIgine (LAMICTAL) 100 MG tablet Take 50-100 mg by mouth 2 (two) times daily. Takes 50mg  in the AM and 100mg  at bedtime.    [provider]  levETIRAcetam (KEPPRA) 500 MG tablet Take 500 mg by mouth daily.    [provider]  linagliptin (TRADJENTA) 5 MG TABS tablet Take 5 mg by mouth at bedtime.     [provider]  metoCLOPramide (REGLAN) 5 MG tablet Take 5 mg by mouth 3 (three) times daily before meals.     [provider]  midodrine (PROAMATINE) 5 MG tablet Take 1 tablet (5 mg total) by mouth 3 (three) times daily with meals. 04/18/16   Gladstone Lighter, MD  omeprazole (PRILOSEC) 20 MG capsule Take 20 mg by mouth daily.     [provider]  ondansetron (ZOFRAN) 4 MG tablet Take 4 mg by mouth every 8 (eight) hours as needed for nausea or vomiting.     [provider]  promethazine (PHENERGAN) 25 MG/ML injection Inject 25 mg into the muscle every 6 (six) hours as needed for nausea or vomiting. 09/23/16 11/15/17  [provider]  sevelamer carbonate (RENVELA) 800 MG tablet Take 800 mg by mouth 3 (three) times daily.     [provider]    REVIEW OF SYSTEMS:  Review of Systems  Unable to perform ROS: Acuity of condition     VITAL SIGNS:   Vitals:   01/02/17 1945 01/02/17 2030 01/02/17 2100 01/02/17 2130  BP:  120/63 129/66 132/73  Pulse:  82 84 81  Resp:  17 (!) 21 (!) 21  Temp:      TempSrc:      SpO2:  96% 97% 98%  Weight: 77.1 kg (170 lb)     Height: 5\' 11"  (1.803 m)      Wt Readings from Last 3 Encounters:  01/02/17 77.1 kg (170 lb)  11/26/16 72.6 kg (160 lb)  11/18/16 76.2 kg (167 lb 15.9 oz)    PHYSICAL EXAMINATION:  Physical Exam  Vitals reviewed. Constitutional: He appears well-developed and well-nourished. No distress.  HENT:  Head: Normocephalic and atraumatic.  Mouth/Throat: Oropharynx is clear and  moist.  Eyes: Pupils are equal, round, and reactive to light. Conjunctivae and EOM are normal. No scleral icterus.  Neck: Normal range of motion. Neck supple. No JVD present. No thyromegaly present.  Cardiovascular: Normal rate, regular rhythm and intact distal pulses.  Exam reveals no gallop and no friction rub.   No murmur heard. Respiratory: Effort normal and breath sounds normal. No respiratory distress. He has no wheezes. He has no rales.  GI: Soft. Bowel sounds are normal. He exhibits  no distension. There is no tenderness.  Musculoskeletal: Normal range of motion. He exhibits no edema.  No arthritis, no gout  Lymphadenopathy:    He has no cervical adenopathy.  Neurological: He is alert. No cranial nerve deficit.  Unable to fully assess due to patient condition  Skin: Skin is warm and dry. No rash noted. No erythema.  Psychiatric:  Unable to completely assess due to patient condition    LABORATORY PANEL:   CBC  Recent Labs Lab 01/02/17 1953  WBC 10.7*  HGB 13.1  HCT 39.3*  PLT 156   ------------------------------------------------------------------------------------------------------------------  Chemistries   Recent Labs Lab 01/02/17 2135  NA 136  K 4.0  CL 96*  CO2 23  GLUCOSE 192*  BUN 80*  CREATININE 7.79*  CALCIUM 8.9  AST 27  ALT 25  ALKPHOS 123  BILITOT 0.8   ------------------------------------------------------------------------------------------------------------------  Cardiac Enzymes  Recent Labs Lab 01/02/17 2135  TROPONINI 1.54*   ------------------------------------------------------------------------------------------------------------------  RADIOLOGY:  Dg Chest 2 View  Result Date: 01/02/2017 CLINICAL DATA:  Unresponsive. EXAM: CHEST  2 VIEW COMPARISON:  Radiograph of November 15, 2016. FINDINGS: Stable cardiomediastinal silhouette. Atherosclerosis of thoracic aorta is noted. No pneumothorax is noted. Right lung is clear. Elevated  left hemidiaphragm is noted with mild left basilar subsegmental atelectasis. Minimal left pleural effusion is noted. Bony thorax is unremarkable. IMPRESSION: Aortic atherosclerosis. Elevated left hemidiaphragm with mild left basilar subsegmental atelectasis. Minimal left pleural effusion is noted. Electronically Signed   By: Marijo Conception, M.D.   On: 01/02/2017 20:35   Ct Head Wo Contrast  Result Date: 01/02/2017 CLINICAL DATA:  Altered level of consciousness. EXAM: CT HEAD WITHOUT CONTRAST TECHNIQUE: Contiguous axial images were obtained from the base of the skull through the vertex without intravenous contrast. COMPARISON:  CT scan of November 15, 2016. FINDINGS: Brain: Mild diffuse cortical atrophy is noted. Mild chronic ischemic white matter disease is noted. Old basal ganglia lacunar infarctions are noted bilaterally. No mass effect or midline shift is noted. Ventricular size is within normal limits. There is no evidence of mass lesion, hemorrhage or acute infarction. Old right occipital infarction is noted. Vascular: No hyperdense vessel or unexpected calcification. Skull: Normal. Negative for fracture or focal lesion. Sinuses/Orbits: No acute finding. Other: None. IMPRESSION: Mild diffuse cortical atrophy. Mild chronic ischemic white matter disease. No acute intracranial abnormality seen. Electronically Signed   By: Marijo Conception, M.D.   On: 01/02/2017 20:21    EKG:   Orders placed or performed during the hospital encounter of 11/15/16  . ED EKG  . ED EKG  . EKG 12-Lead  . EKG 12-Lead  . EKG    IMPRESSION AND PLAN:  Principal Problem:   NSTEMI (non-ST elevated myocardial infarction) ([place]) - start IV heparin, trend cardiac enzymes, get a cardiology consult Active Problems:   ESRD on hemodialysis [facility]) - nephrology consult for dialysis support   IDDM (insulin dependent diabetes mellitus) ([place]) - sliding scale insulin with corresponding glucose checks   HTN (hypertension) - continue home  meds  GERD (gastroesophageal reflux disease) - home dose PPI  All the records are reviewed and case discussed with ED provider. Management plans discussed with the patient and/or family.  DVT PROPHYLAXIS: Systemic anticoagulation  GI PROPHYLAXIS: PPI  ADMISSION STATUS: Observation  CODE STATUS: DNR Code Status History    Date Active Date Inactive Code Status Order ID Comments User Context   11/15/2016 12:30 PM 11/19/2016  8:50 PM DNR [id number]  Nicholes Mango, MD Inpatient  09/25/2016  2:25 PM 09/29/2016  8:03 PM Partial Code [id number]  Knox Royalty, NP Inpatient   09/24/2016 10:42 AM 09/25/2016  2:25 PM Full Code [id number]  Saundra Shelling, MD Inpatient   04/13/2016 12:22 AM 04/18/2016  6:46 PM Full Code [id number]  Holley Raring, NP ED   07/11/2013  3:42 PM 08/02/2013  6:20 PM Full Code [id number]  Genella Rife Inpatient    Questions for Most Recent Historical Code Status (Order [id number])    Question Answer Comment   In the event of cardiac or respiratory ARREST Do not call a "code blue"    In the event of cardiac or respiratory ARREST Do not perform Intubation, CPR, defibrillation or ACLS    In the event of cardiac or respiratory ARREST Use medication by any route, position, wound care, and other measures to relive pain and suffering. May use oxygen, suction and manual treatment of airway obstruction as needed for comfort.    Comments RN may pronounce       TOTAL TIME TAKING CARE OF THIS PATIENT: 40 minutes.   Shantaya Bluestone FIELDING 01/02/2017, 11:22 PM  [facility]  Office  [phone]  CC: Primary care physician; Juluis Pitch, MD  Note:  This document was prepared using Dragon voice recognition software and may include unintentional dictation errors.

## 2017-01-03 ENCOUNTER — Inpatient Hospital Stay (HOSPITAL_COMMUNITY)
Admit: 2017-01-03 | Discharge: 2017-01-03 | Disposition: A | Discharge status: Home / Self Care | Payer: Medicare Other | Attending: Internal Medicine | Admitting: Internal Medicine

## 2017-01-03 DIAGNOSIS — N186 End stage renal disease: Secondary | ICD-10-CM | POA: Diagnosis present

## 2017-01-03 DIAGNOSIS — Z66 Do not resuscitate: Secondary | ICD-10-CM | POA: Diagnosis present

## 2017-01-03 DIAGNOSIS — D631 Anemia in chronic kidney disease: Secondary | ICD-10-CM | POA: Diagnosis present

## 2017-01-03 DIAGNOSIS — I959 Hypotension, unspecified: Secondary | ICD-10-CM | POA: Diagnosis not present

## 2017-01-03 DIAGNOSIS — I248 Other forms of acute ischemic heart disease: Secondary | ICD-10-CM | POA: Diagnosis present

## 2017-01-03 DIAGNOSIS — I2510 Atherosclerotic heart disease of native coronary artery without angina pectoris: Secondary | ICD-10-CM | POA: Diagnosis present

## 2017-01-03 DIAGNOSIS — Z809 Family history of malignant neoplasm, unspecified: Secondary | ICD-10-CM | POA: Diagnosis not present

## 2017-01-03 DIAGNOSIS — R4182 Altered mental status, unspecified: Secondary | ICD-10-CM | POA: Diagnosis present

## 2017-01-03 DIAGNOSIS — E11621 Type 2 diabetes mellitus with foot ulcer: Secondary | ICD-10-CM | POA: Diagnosis present

## 2017-01-03 DIAGNOSIS — Z96642 Presence of left artificial hip joint: Secondary | ICD-10-CM | POA: Diagnosis present

## 2017-01-03 DIAGNOSIS — G40909 Epilepsy, unspecified, not intractable, without status epilepticus: Secondary | ICD-10-CM | POA: Diagnosis present

## 2017-01-03 DIAGNOSIS — F0150 Vascular dementia without behavioral disturbance: Secondary | ICD-10-CM | POA: Diagnosis present

## 2017-01-03 DIAGNOSIS — I120 Hypertensive chronic kidney disease with stage 5 chronic kidney disease or end stage renal disease: Secondary | ICD-10-CM | POA: Diagnosis present

## 2017-01-03 DIAGNOSIS — F0390 Unspecified dementia without behavioral disturbance: Secondary | ICD-10-CM

## 2017-01-03 DIAGNOSIS — Z8673 Personal history of transient ischemic attack (TIA), and cerebral infarction without residual deficits: Secondary | ICD-10-CM | POA: Diagnosis not present

## 2017-01-03 DIAGNOSIS — E1122 Type 2 diabetes mellitus with diabetic chronic kidney disease: Secondary | ICD-10-CM | POA: Diagnosis present

## 2017-01-03 DIAGNOSIS — L97518 Non-pressure chronic ulcer of other part of right foot with other specified severity: Secondary | ICD-10-CM | POA: Diagnosis present

## 2017-01-03 DIAGNOSIS — R748 Abnormal levels of other serum enzymes: Secondary | ICD-10-CM | POA: Diagnosis not present

## 2017-01-03 DIAGNOSIS — Z886 Allergy status to analgesic agent status: Secondary | ICD-10-CM | POA: Diagnosis not present

## 2017-01-03 DIAGNOSIS — N2581 Secondary hyperparathyroidism of renal origin: Secondary | ICD-10-CM | POA: Diagnosis present

## 2017-01-03 DIAGNOSIS — E785 Hyperlipidemia, unspecified: Secondary | ICD-10-CM | POA: Diagnosis present

## 2017-01-03 DIAGNOSIS — I361 Nonrheumatic tricuspid (valve) insufficiency: Secondary | ICD-10-CM | POA: Diagnosis not present

## 2017-01-03 DIAGNOSIS — Z8546 Personal history of malignant neoplasm of prostate: Secondary | ICD-10-CM | POA: Diagnosis not present

## 2017-01-03 DIAGNOSIS — E1151 Type 2 diabetes mellitus with diabetic peripheral angiopathy without gangrene: Secondary | ICD-10-CM | POA: Diagnosis present

## 2017-01-03 DIAGNOSIS — E1142 Type 2 diabetes mellitus with diabetic polyneuropathy: Secondary | ICD-10-CM | POA: Diagnosis present

## 2017-01-03 DIAGNOSIS — Z992 Dependence on renal dialysis: Secondary | ICD-10-CM | POA: Diagnosis not present

## 2017-01-03 DIAGNOSIS — Z794 Long term (current) use of insulin: Secondary | ICD-10-CM | POA: Diagnosis not present

## 2017-01-03 DIAGNOSIS — I9589 Other hypotension: Secondary | ICD-10-CM | POA: Diagnosis present

## 2017-01-03 DIAGNOSIS — K219 Gastro-esophageal reflux disease without esophagitis: Secondary | ICD-10-CM | POA: Diagnosis present

## 2017-01-03 LAB — BASIC METABOLIC PANEL
ANION GAP: 17 — AB (ref 5–15)
BUN: 88 mg/dL — ABNORMAL HIGH (ref 6–20)
CALCIUM: 9 mg/dL (ref 8.9–10.3)
CO2: 25 mmol/L (ref 22–32)
Chloride: 97 mmol/L — ABNORMAL LOW (ref 101–111)
Creatinine, Ser: 8.6 mg/dL — ABNORMAL HIGH (ref 0.61–1.24)
GFR, EST AFRICAN AMERICAN: 6 mL/min — AB (ref >60)
GFR, EST NON AFRICAN AMERICAN: 5 mL/min — AB (ref >60)
Glucose, Bld: 114 mg/dL — ABNORMAL HIGH (ref 65–99)
Potassium: 4.2 mmol/L (ref 3.5–5.1)
Sodium: 139 mmol/L (ref 135–145)

## 2017-01-03 LAB — CBC
HCT: 42.7 % (ref 40.0–52.0)
HEMOGLOBIN: 13.9 g/dL (ref 13.0–18.0)
MCH: 31.5 pg (ref 26.0–34.0)
MCHC: 32.6 g/dL (ref 32.0–36.0)
MCV: 96.6 fL (ref 80.0–100.0)
Platelets: 160 10*3/uL (ref 150–440)
RBC: 4.43 MIL/uL (ref 4.40–5.90)
RDW: 15.5 % — ABNORMAL HIGH (ref 11.5–14.5)
WBC: 7.7 10*3/uL (ref 3.8–10.6)

## 2017-01-03 LAB — PHOSPHORUS: PHOSPHORUS: 4.5 mg/dL (ref 2.5–4.6)

## 2017-01-03 LAB — GLUCOSE, CAPILLARY
GLUCOSE-CAPILLARY: 132 mg/dL — AB (ref 65–99)
Glucose-Capillary: 124 mg/dL — ABNORMAL HIGH (ref 65–99)
Glucose-Capillary: 119 mg/dL — ABNORMAL HIGH (ref 65–99)
Glucose-Capillary: 153 mg/dL — ABNORMAL HIGH (ref 65–99)

## 2017-01-03 LAB — ECHOCARDIOGRAM COMPLETE
HEIGHTINCHES: 71 in
WEIGHTICAEL: 2896 [oz_av]

## 2017-01-03 LAB — APTT: APTT: 33 s (ref 24–36)

## 2017-01-03 LAB — TROPONIN I
TROPONIN I: 2.04 ng/mL — AB (ref <0.03)
Troponin I: 2.11 ng/mL (ref <0.03)
Troponin I: 1.61 ng/mL (ref <0.03)
Troponin I: 1.57 ng/mL (ref <0.03)

## 2017-01-03 LAB — PROTIME-INR
INR: 0.95
PROTHROMBIN TIME: 12.6 s (ref 11.4–15.2)

## 2017-01-03 LAB — MRSA PCR SCREENING: MRSA BY PCR: POSITIVE — AB

## 2017-01-03 LAB — HEPARIN LEVEL (UNFRACTIONATED): Heparin Unfractionated: 0.21 IU/mL — ABNORMAL LOW (ref 0.30–0.70)

## 2017-01-03 MED ORDER — LAMOTRIGINE 100 MG PO TABS
100.0000 mg | ORAL_TABLET | Freq: Every day | ORAL | Status: DC
  Administered 2017-01-03 – 2017-01-04 (×2): 100 mg via ORAL
  Filled 2017-01-03 (×2): qty 1

## 2017-01-03 MED ORDER — HEPARIN BOLUS VIA INFUSION
1200.0000 [IU] | Freq: Once | INTRAVENOUS | Status: AC
  Administered 2017-01-03: 1200 [IU] via INTRAVENOUS
  Filled 2017-01-03: qty 1200

## 2017-01-03 MED ORDER — HEPARIN SODIUM (PORCINE) 1000 UNIT/ML DIALYSIS: 1000.0000 [IU] | INTRAMUSCULAR | Status: DC | PRN

## 2017-01-03 MED ORDER — ALTEPLASE 2 MG IJ SOLR: 2.0000 mg | Freq: Once | INTRAMUSCULAR | Status: DC | PRN

## 2017-01-03 MED ORDER — ACETAMINOPHEN 325 MG PO TABS
650.0000 mg | ORAL_TABLET | Freq: Four times a day (QID) | ORAL | Status: DC | PRN
  Administered 2017-01-04: 650 mg via ORAL
  Filled 2017-01-03: qty 2

## 2017-01-03 MED ORDER — HEPARIN BOLUS VIA INFUSION
4000.0000 [IU] | Freq: Once | INTRAVENOUS | Status: AC
Start: 2017-01-03 — End: 2017-01-03
  Administered 2017-01-03: 4000 [IU] via INTRAVENOUS
  Filled 2017-01-03: qty 4000

## 2017-01-03 MED ORDER — INSULIN ASPART 100 UNIT/ML ~~LOC~~ SOLN
0.0000 [IU] | Freq: Four times a day (QID) | SUBCUTANEOUS | Status: DC
  Administered 2017-01-04: 5 [IU] via SUBCUTANEOUS
  Administered 2017-01-04: 1 [IU] via SUBCUTANEOUS
  Filled 2017-01-03 (×2): qty 1

## 2017-01-03 MED ORDER — CHLORHEXIDINE GLUCONATE CLOTH 2 % EX PADS
6.0000 | MEDICATED_PAD | Freq: Every day | CUTANEOUS | Status: DC
  Administered 2017-01-03 – 2017-01-05 (×3): 6 via TOPICAL

## 2017-01-03 MED ORDER — MUPIROCIN 2 % EX OINT
1.0000 "application " | TOPICAL_OINTMENT | Freq: Two times a day (BID) | CUTANEOUS | Status: DC
  Administered 2017-01-03 – 2017-01-05 (×5): 1 via NASAL
  Filled 2017-01-03: qty 22

## 2017-01-03 MED ORDER — MIDODRINE HCL 5 MG PO TABS
5.0000 mg | ORAL_TABLET | Freq: Three times a day (TID) | ORAL | Status: DC
  Administered 2017-01-03 – 2017-01-04 (×3): 5 mg via ORAL
  Filled 2017-01-03 (×4): qty 1

## 2017-01-03 MED ORDER — PENTAFLUOROPROP-TETRAFLUOROETH EX AERO: 1.0000 "application " | INHALATION_SPRAY | CUTANEOUS | Status: DC | PRN

## 2017-01-03 MED ORDER — LEVETIRACETAM 500 MG PO TABS
500.0000 mg | ORAL_TABLET | Freq: Every day | ORAL | Status: DC
Start: 2017-01-03 — End: 2017-01-05
  Administered 2017-01-03 – 2017-01-05 (×3): 500 mg via ORAL
  Filled 2017-01-03 (×3): qty 1

## 2017-01-03 MED ORDER — ATORVASTATIN CALCIUM 20 MG PO TABS
20.0000 mg | ORAL_TABLET | Freq: Every day | ORAL | Status: DC
  Administered 2017-01-03 – 2017-01-04 (×2): 20 mg via ORAL
  Filled 2017-01-03 (×2): qty 1

## 2017-01-03 MED ORDER — LAMOTRIGINE 100 MG PO TABS
50.0000 mg | ORAL_TABLET | Freq: Every day | ORAL | Status: DC
  Administered 2017-01-03 – 2017-01-05 (×3): 50 mg via ORAL
  Filled 2017-01-03 (×4): qty 1

## 2017-01-03 MED ORDER — LAMOTRIGINE 100 MG PO TABS: 50.0000 mg | ORAL_TABLET | Freq: Two times a day (BID) | ORAL | Status: DC

## 2017-01-03 MED ORDER — PANTOPRAZOLE SODIUM 40 MG PO TBEC
40.0000 mg | DELAYED_RELEASE_TABLET | Freq: Every day | ORAL | Status: DC
  Administered 2017-01-03 – 2017-01-05 (×3): 40 mg via ORAL
  Filled 2017-01-03 (×3): qty 1

## 2017-01-03 MED ORDER — HEPARIN (PORCINE) IN NACL 100-0.45 UNIT/ML-% IJ SOLN
1050.0000 [IU]/h | INTRAMUSCULAR | Status: DC
  Administered 2017-01-03: 900 [IU]/h via INTRAVENOUS
  Administered 2017-01-04: 1050 [IU]/h via INTRAVENOUS
  Filled 2017-01-03 (×2): qty 250

## 2017-01-03 MED ORDER — SODIUM CHLORIDE 0.9 % IV SOLN: 100.0000 mL | INTRAVENOUS | Status: DC | PRN

## 2017-01-03 MED ORDER — SEVELAMER CARBONATE 800 MG PO TABS
800.0000 mg | ORAL_TABLET | Freq: Three times a day (TID) | ORAL | Status: DC
  Administered 2017-01-03 – 2017-01-05 (×6): 800 mg via ORAL
  Filled 2017-01-03 (×6): qty 1

## 2017-01-03 MED ORDER — LIDOCAINE-PRILOCAINE 2.5-2.5 % EX CREA: 1.0000 "application " | TOPICAL_CREAM | CUTANEOUS | Status: DC | PRN

## 2017-01-03 MED ORDER — LIDOCAINE HCL (PF) 1 % IJ SOLN: 5.0000 mL | INTRAMUSCULAR | Status: DC | PRN

## 2017-01-03 MED ORDER — METOCLOPRAMIDE HCL 5 MG PO TABS
5.0000 mg | ORAL_TABLET | Freq: Three times a day (TID) | ORAL | Status: DC
  Administered 2017-01-03 – 2017-01-05 (×6): 5 mg via ORAL
  Filled 2017-01-03 (×6): qty 1

## 2017-01-03 MED ORDER — ACETAMINOPHEN 650 MG RE SUPP: 650.0000 mg | Freq: Four times a day (QID) | RECTAL | Status: DC | PRN

## 2017-01-03 MED ORDER — GABAPENTIN 100 MG PO CAPS
100.0000 mg | ORAL_CAPSULE | Freq: Every day | ORAL | Status: DC
  Administered 2017-01-03 – 2017-01-04 (×2): 100 mg via ORAL
  Filled 2017-01-03 (×2): qty 1

## 2017-01-03 NOTE — Progress Notes (Signed)
This note also relates to the following rows which could not be included: Pulse Rate - Cannot attach notes to unvalidated device data Resp - Cannot attach notes to unvalidated device data BP - Cannot attach notes to unvalidated device data  Hd completed  

## 2017-01-03 NOTE — Progress Notes (Signed)
Patient was admitted to 246 about 0100. Patient is lethargic but arouses to verbal stimulation. Patient is unable to answer questions. No family at the bedside. Unable to complete admission profile. No signs of distress or pain. Patient is resting comfortably with eyes closed. Will continue to monitor and assess.

## 2017-01-03 NOTE — Progress Notes (Signed)
Post dialysis assessment 

## 2017-01-03 NOTE — Progress Notes (Signed)
ANTICOAGULATION CONSULT NOTE - Initial Consult  Pharmacy Consult for heparin drip Indication: chest pain/ACS  Allergies  Allergen Reactions  . Aspirin Other (See Comments)    Bleeding   . Bc Powder [Aspirin-Salicylamide-Caffeine]     MAR did not specify a reaction  . Caffeine     MAR did not specify a reaction  . 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  . Ramipril Other (See Comments)    Imported from [facility] - reaction: unknown    Patient Measurements: Height: 5\' 11"  (180.3 cm) Weight: 181 lb 10.5 oz (82.4 kg) IBW/kg (Calculated) : 75.3 Heparin Dosing Weight: 77 kg  Vital Signs: Temp: 98.2 F (36.8 C) (09/21 1900) Temp Source: Oral (09/21 1900) BP: 106/58 (09/21 1915) Pulse Rate: 69 (09/21 1915)  Labs:  Recent Labs  01/02/17 1953  01/02/17 2135 01/03/17 0237 01/03/17 0931 01/03/17 1600 01/03/17 1640 01/03/17 1850  HGB 13.1  --   --   --  13.9  --   --   --   HCT 39.3*  --   --   --  42.7  --   --   --   PLT 156  --   --   --  160  --   --   --   APTT  --   --   --  33  --   --   --   --   LABPROT  --   --   --  12.6  --   --   --   --   INR  --   --   --  0.95  --   --   --   --   HEPARINUNFRC  --   --   --   --   --   --  0.21*  --   CREATININE  --   --  7.79*  --  8.60*  --   --   --   TROPONINI  --   < > 1.54* 2.11* 2.04* 1.61*  --  1.57*  < > = values in this interval not displayed.  Estimated Creatinine Clearance: 6.7 mL/min (A) (by C-G formula based on SCr of 8.6 mg/dL (H)).   Medical  History: Past Medical History:  Diagnosis Date  . Anemia of chronic disease   . Chronic pain disorder   . Dementia   . Depression   . E coli bacteremia    a. 09/2016.  [name] Elevated troponin    a. 09/2016 Echo: EF 60-65%, mild LVH, Gr1 DD; b. 12/2016 Echo: EF 60-65%, no rwma, Gr1 DD, mildly dil Ao root @ 3.6cm, mildly dil LA, nl RV fxn, PASP 3mmHg.  [name] ESRD on hemodialysis ([place])    a. MWF  . Falls frequently   . GERD (gastroesophageal reflux disease)   . Hypertension   . Insulin dependent diabetes mellitus with complications ([place])   . Mood disorder ([place])   . Peripheral neuropathy    a. diabetic neuropathy  . Peripheral vascular disease ([place])    a. s/p prior  toe amputation; b. s/p prior L pop to prox peroneal bypass;  c. 06/2010 R BK pop to peroneal bypass; c. 04/2012 PTA of prox bypass of L pop artery.  . Prostate cancer ([place])   . Seizures ([place])    EPILEPSY  . Stroke [facility])    TIA  . Weakness generalized     Medications:  No anticoagulation in PTA meds.  Assessment:  Goal of Therapy:  Heparin level 0.3-0.7 units/ml Monitor platelets by anticoagulation protocol: Yes   Plan:  4000 unit bolus and initial rate of 900 units/hr. First heparin level 8 hours after start of infusion.  9/21:  HL @ 16:40 = 0.21 Will order Heparin 1200 units IV X 1 bolus and increase drip rate to 1050 units/hr .    Will recheck HL 8 hrs after rate change.   Sahith Nurse D 01/03/2017,9:41 PM

## 2017-01-03 NOTE — Progress Notes (Signed)
[place] [facility] [place] NAME: Michael Acosta    MR#:  [id number]  DATE OF BIRTH:  [birth year]  SUBJECTIVE:admitted for altered mental status, found to have a non-ST elevation MI. Patient still confused but answers some of questions appropriately.I'm not sure if  he had chest pain or not.  CHIEF COMPLAINT:   Chief Complaint  Patient presents with  . Altered Mental Status    REVIEW OF SYSTEMS:   Review of Systems  Unable to perform ROS: Dementia     DRUG ALLERGIES:   Allergies  Allergen Reactions  . Aspirin Other (See Comments)    Bleeding   . Bc Powder [Aspirin-Salicylamide-Caffeine]     MAR did not specify a reaction  . Caffeine     MAR did not specify a reaction  . 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  . Ramipril Other (See Comments)    Imported from [facility] - reaction: unknown    VITALS:  Blood pressure (!) 117/47, pulse 74, temperature (!) 97.5 F (36.4 C), temperature source Oral, resp. rate 14, height 5\' 11"  (1.803 m), weight 82.1 kg (181 lb), SpO2 98 %.  PHYSICAL EXAMINATION:  GENERAL:  83 y.o.-year-old patient lying in the bed with no acute distress.  EYES: Pupils equal, round, reactive to light . No scleral icterus. Extraocular muscles intact.  HEENT: Head atraumatic, normocephalic. Oropharynx and nasopharynx clear.  NECK:  Supple, no jugular venous distention. No thyroid enlargement, no tenderness.  LUNGS: Normal breath sounds bilaterally, no  wheezing, rales,rhonchi or crepitation. No use of accessory muscles of respiration.  CARDIOVASCULAR: S1, S2 normal. No murmurs, rubs, or gallops.  ABDOMEN: Soft, nontender, nondistended. Bowel sounds present. No organomegaly or mass.  EXTREMITIES: No pedal edema, cyanosis, or clubbing.  NEUROLOGIC;unable to do full neurological exam because patient is demented and does not follow commands. PSYCHIATRIC: The patient is alert and oriented x 3.  SKIN: area for dry gangrene on second toe of right foot.    LABORATORY PANEL:   CBC  Recent Labs Lab 01/03/17 0931  WBC 7.7  HGB 13.9  HCT 42.7  PLT 160   ------------------------------------------------------------------------------------------------------------------  Chemistries   Recent Labs Lab 01/02/17 2135 01/03/17 0931  NA 136 139  K 4.0 4.2  CL 96* 97*  CO2 23 25  GLUCOSE 192* 114*  BUN 80* 88*  CREATININE 7.79* 8.60*  CALCIUM 8.9 9.0  AST 27  --   ALT 25  --   ALKPHOS 123  --   BILITOT 0.8  --    ------------------------------------------------------------------------------------------------------------------  Cardiac Enzymes  Recent Labs Lab 01/03/17 0931  TROPONINI 2.04*   ------------------------------------------------------------------------------------------------------------------  RADIOLOGY:  Dg Chest 2 View  Result Date: 01/02/2017 CLINICAL DATA:  Unresponsive. EXAM: CHEST  2 VIEW COMPARISON:  Radiograph of November 15, 2016. FINDINGS: Stable cardiomediastinal silhouette. Atherosclerosis of thoracic aorta is noted. No pneumothorax is noted. Right lung is clear. Elevated left hemidiaphragm is noted with mild left basilar subsegmental atelectasis. Minimal left pleural effusion is noted. Bony thorax is unremarkable. IMPRESSION: Aortic atherosclerosis. Elevated  left hemidiaphragm with mild left basilar subsegmental atelectasis. Minimal left pleural effusion is noted. Electronically Signed   By: Marijo Conception,  M.D.   On: 01/02/2017 20:35   Ct Head Wo Contrast  Result Date: 01/02/2017 CLINICAL DATA:  Altered level of consciousness. EXAM: CT HEAD WITHOUT CONTRAST TECHNIQUE: Contiguous axial images were obtained from the base of the skull through the vertex without intravenous contrast. COMPARISON:  CT scan of November 15, 2016. FINDINGS: Brain: Mild diffuse cortical atrophy is noted. Mild chronic ischemic white matter disease is noted. Old basal ganglia lacunar infarctions are noted bilaterally. No mass effect or midline shift is noted. Ventricular size is within normal limits. There is no evidence of mass lesion, hemorrhage or acute infarction. Old right occipital infarction is noted. Vascular: No hyperdense vessel or unexpected calcification. Skull: Normal. Negative for fracture or focal lesion. Sinuses/Orbits: No acute finding. Other: None. IMPRESSION: Mild diffuse cortical atrophy. Mild chronic ischemic white matter disease. No acute intracranial abnormality seen. Electronically Signed   By: Marijo Conception, M.D.   On: 01/02/2017 20:21    EKG:   Orders placed or performed during the hospital encounter of 01/02/17  . EKG 12-Lead  . EKG 12-Lead    ASSESSMENT AND PLAN:  #10. 83 year old male patient with altered mental status comes from [facility] found to have elevated troponins. Admitted to telemetry. #1/non-ST elevation MI present on admission with elevated troponins up to 2.04.Patient is on heparin drip, cardiology consult is requested.cardiogram is done but results are pending. #2 altered mental status;the encephalopathy.CT head unremarkable.follow sepsis workup. Unknown baseline. But according to nephrology patient is more lethargic today . Check ABG also. #3.D on hemodialysis Monday, Wednesday, Friday. #4 insulin-dependent diabetes mellitus type 2:start low-sodium ADA diet. #5 .history of seizure disorder, vascular dementia';he is on Lamictal, Keppra,  CODE STATUS DO NOT RESUSCITATE    All  the records are reviewed and case discussed with Care Management/Social Workerr. Management plans discussed with the patient, family and they are in agreement.  CODE STATUS: DNR  TOTAL TIME TAKING CARE OF THIS PATIENT: 35 minutes.   POSSIBLE D/C IN 1-2 DAYS, DEPENDING ON CLINICAL CONDITION.   Epifanio Lesches M.D on 01/03/2017 at 12:49 PM  Between 7am to 6pm - Pager - [phone]  After 6pm go to [email or web address] - password [email or web address] [facility]  Office  [phone]  CC: Primary care physician; Juluis Pitch, MD   Note: This dictation was prepared with Dragon dictation along with smaller phrase technology. Any transcriptional errors that result from this process are unintentional.

## 2017-01-03 NOTE — Care Management Obs Status (Signed)
MEDICARE OBSERVATION STATUS NOTIFICATION   Patient Details  Name: Michael Acosta MRN: [id number] Date of Birth: Oct 03, 1929    Medicare Observation Status Notification Given:  No Admitted <24 hours of being placed in observation  Katrina Stack, RN 01/03/2017, 11:35 AM

## 2017-01-03 NOTE — Consult Note (Signed)
Cardiology Consult    Patient ID: Michael Acosta MRN: [id number], DOB/AGE: 81-Feb-1931   Admit date: 01/02/2017 Date of Consult: 01/03/2017  Primary Physician: Michael Pitch, MD Primary Cardiologist: Michael Acosta Requesting Provider: Governor Specking, MD  Patient Profile    Michael Acosta is a 83 y.o. male with a history of ESRD, dementia, IDDM w/ neuropathy, PVD, CVA, HTN, HL, falls, anemia of chronic dzs, prostate CA, and mood d/o, who is being seen today for the evaluation of elevated troponin at the request of Dr. Vianne Acosta.  Past Medical History   Past Medical History:  Diagnosis Date  . Anemia of chronic disease   . Chronic pain disorder   . Dementia   . Depression   . E coli bacteremia    a. 09/2016.  Michael Acosta Elevated troponin    a. 09/2016 Echo: EF 60-65%, mild LVH, Gr1 DD; b. 12/2016 Echo: EF 60-65%, no rwma, Gr1 DD, mildly dil Ao root @ 3.6cm, mildly dil LA, nl RV fxn, PASP 49mmHg.  Michael Acosta ESRD on hemodialysis ([place])    a. MWF  . Falls frequently   . GERD (gastroesophageal reflux disease)   . Hypertension   . Insulin dependent diabetes mellitus with complications ([place])   . Mood disorder ([place])   . Peripheral neuropathy    a. diabetic neuropathy  . Peripheral vascular disease ([place])    a. s/p prior toe amputation; b. s/p prior L pop to prox peroneal bypass;  c. 06/2010 R BK pop to peroneal bypass; c. 04/2012 PTA of prox bypass of L pop artery.  . Prostate cancer ([place])   . Seizures (Michael Acosta)    EPILEPSY  . Stroke Michael Acosta)    TIA  . Weakness generalized     Past Surgical History:  Procedure Laterality Date  . COLON SURGERY     RESECTION  . INSERTION OF DIALYSIS CATHETER     SHUNT  . LEG SURGERY Left    MULTIPLE VEIN SURGERIES  . LOWER EXTREMITY ANGIOGRAPHY Right 11/07/2016   Procedure: Lower Extremity Angiography;  Surgeon: Michael Huxley, MD;  Location: [place] [facility];  Service: Cardiovascular;  Laterality: Right;  . TOTAL HIP ARTHROPLASTY Left      Allergies  Allergies  Allergen  Reactions  . Aspirin Other (See Comments)    Bleeding   . Bc Powder [Aspirin-Salicylamide-Caffeine]     MAR did not specify a reaction  . Caffeine     MAR did not specify a reaction  . 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  . Ramipril Other (See Comments)    Imported from Michael Acosta - reaction: unknown    History of Present Illness    83 year old male with the above complex past medical history including diabetes with diabetic neuropathy and end-stage renal disease on Monday Wednesday Friday dialysis, peripheral vascular disease status post multiple interventions at [facility], dementia, prior stroke, hypertension, hyperlipidemia, falls, anemia of chronic disease, prostate cancer, and mood disorder. He was admitted over the summer, in June, with Escherichia coli bacteremia and sepsis and was noted to have an elevated  troponin at that time. Our team evaluated him and echocardiogram showed normal LV function. Medical therapy was recommended. Patient lives at [facility] and by his description, he is sedentary, either in bed or in chair all day. He walks very little.  By report, he apparently had an unresponsive episode followed by confusion and hypotension, prompting ER evaluation on September 20.  When asked today what caused him to be admitted, he says he thinks he was told that he had a dizzy spell while in the bathroom yesterday at [facility]. He denies any chest pain or dyspnea. He does not recall losing consciousness. Regardless, he  was found to have an elevated troponin in the emergency department, eventually rising to 2.11. He was admitted and cardiology was called for evaluation. Echocardiogram today again shows normal LV function.   Inpatient Medications    . atorvastatin  20 mg Oral QHS  . Chlorhexidine Gluconate Cloth  6 each Topical Q0600  . gabapentin  100 mg Oral QHS  . insulin aspart  0-9 Units Subcutaneous Q6H  . lamoTRIgine  100 mg Oral QHS  . lamoTRIgine  50 mg Oral Daily  . levETIRAcetam  500 mg Oral Daily  . metoCLOPramide  5 mg Oral TID AC  . midodrine  5 mg Oral TID WC  . mupirocin ointment  1 application Nasal BID  . pantoprazole  40 mg Oral Daily  . sevelamer carbonate  800 mg Oral TID    Family History    Family History  Problem Relation Age of Onset  . Cancer Father   . Cancer Sister   . CAD Neg Hx   . Diabetes Neg Hx     Social History    Social History   Social History  . Marital status: Married    Spouse name: N/A  . Number of children: N/A  . Years of education: N/A   Occupational History  . retired    Social History Main Topics  . Smoking status: Never Smoker  . Smokeless tobacco: Never Used  . Alcohol use No  . Drug use: No  . Sexual activity: No   Other Topics Concern  . Not on file   Social History Narrative  . No narrative on file     Review of Systems    General:  No chills, fever, night sweats or weight changes.  Cardiovascular:  No chest pain, dyspnea on exertion, edema, orthopnea, palpitations, paroxysmal nocturnal dyspnea. Dermatological: No rash, lesions/masses Respiratory: No cough, dyspnea Urologic: No hematuria, dysuria Abdominal:   No nausea, vomiting, diarrhea, bright red blood per rectum, melena, or hematemesis Neurologic:  No visual changes, wkns, changes in mental status. All other systems reviewed and are otherwise negative except as noted above.  Physical Exam    Blood pressure (!) 117/47, pulse 74, temperature (!) 97.5 F (36.4  C), temperature source Oral, resp. rate 14, height 5\' 11"  (1.803 m), weight 181 lb (82.1 kg), SpO2 98 %.  General: Pleasant, NAD Psych: flat affect. Neuro: Alert and oriented X 3. Moves all extremities spontaneously. Doesn't remember events surrounding admission. HEENT: Normal  Neck: Supple without bruits or JVD. Lungs:  Resp regular and unlabored, CTA. Heart: RRR no s3, s4, 2/6 syst murmur @ RUSB and apex. Abdomen: Soft, non-tender, non-distended, BS + x 4.  Extremities: No clubbing, cyanosis or edema. DP/PT/Radials 1+ and equal bilaterally.  Labs     Recent Labs  01/02/17 2135 01/03/17 0237 01/03/17 0931  TROPONINI 1.54* 2.11* 2.04*  Lab Results  Component Value Date   WBC 7.7 01/03/2017   HGB 13.9 01/03/2017   HCT 42.7 01/03/2017   MCV 96.6 01/03/2017   PLT 160 01/03/2017    Recent Labs Lab 01/02/17 2135 01/03/17 0931  NA 136 139  K 4.0 4.2  CL 96* 97*  CO2 23 25  BUN 80* 88*  CREATININE 7.79* 8.60*  CALCIUM 8.9 9.0  PROT 7.8  --   BILITOT 0.8  --   ALKPHOS 123  --   ALT 25  --   AST 27  --   GLUCOSE 192* 114*   Lab Results  Component Value Date   CHOL 111 07/11/2013   HDL 47 07/11/2013   LDLCALC 50 07/11/2013   TRIG 97 09/25/2016     Radiology Studies    Dg Chest 2 View  Result Date: 01/02/2017 CLINICAL DATA:  Unresponsive. EXAM: CHEST  2 VIEW COMPARISON:  Radiograph of November 15, 2016. FINDINGS: Stable cardiomediastinal silhouette. Atherosclerosis of thoracic aorta is noted. No pneumothorax is noted. Right lung is clear. Elevated left hemidiaphragm is noted with mild left basilar subsegmental atelectasis. Minimal left pleural effusion is noted. Bony thorax is unremarkable. IMPRESSION: Aortic atherosclerosis. Elevated left hemidiaphragm with mild left basilar subsegmental atelectasis. Minimal left pleural effusion is noted. Electronically Signed   By: Marijo Conception, M.D.   On: 01/02/2017 20:35   Ct Head Wo Contrast  Result Date:  01/02/2017 CLINICAL DATA:  Altered level of consciousness. EXAM: CT HEAD WITHOUT CONTRAST TECHNIQUE: Contiguous axial images were obtained from the base of the skull through the vertex without intravenous contrast. COMPARISON:  CT scan of November 15, 2016. FINDINGS: Brain: Mild diffuse cortical atrophy is noted. Mild chronic ischemic white matter disease is noted. Old basal ganglia lacunar infarctions are noted bilaterally. No mass effect or midline shift is noted. Ventricular size is within normal limits. There is no evidence of mass lesion, hemorrhage or acute infarction. Old right occipital infarction is noted. Vascular: No hyperdense vessel or unexpected calcification. Skull: Normal. Negative for fracture or focal lesion. Sinuses/Orbits: No acute finding. Other: None. IMPRESSION: Mild diffuse cortical atrophy. Mild chronic ischemic white matter disease. No acute intracranial abnormality seen. Electronically Signed   By: Marijo Conception, M.D.   On: 01/02/2017 20:21    ECG & Cardiac Imaging    RSR, 85, 1st deg avb, ? Prior inf infarct.  2D Echocardiogram 9.21.2018  Conclusions  - Procedure narrative: Transthoracic echocardiography. Image   quality was adequate. The study was technically difficult, as a   result of poor patient compliance and restricted patient   mobility. - Left ventricle: The cavity size was normal. Systolic function was   normal. The estimated ejection fraction was in the range of 60%   to 65%. Wall motion was normal; there were no regional wall   motion abnormalities. Doppler parameters are consistent with   abnormal left ventricular relaxation (grade 1 diastolic   dysfunction). - Aortic valve: Poorly visualized. mild to moderately calcified   leaflets. - Aortic root: The aortic root was mildly dilated, 3.6 cm - Left atrium: The atrium was mildly dilated. - Right ventricle: Systolic function was normal. - Pulmonary arteries: Systolic pressure was within the normal    range. PA peak pressure: 31 mm Hg (S).  Assessment & Plan    1. Elevated troponin: Patient was admitted over the summer with bacteremia and was noted to have a mild troponin elevation. Echocardiogram at that time showed normal LV function.  Given multiple comorbidities including dementia, decision was made to pursue conservative management. Patient stays at [facility] and apparently had an unresponsive episode followed by confusion yesterday. He has no recollection of this. He denies any chest pain or dyspnea. Again, troponin was found to be elevated, peaking at 2.11. He does have end-stage renal disease with a creatinine of 8.6. EKG is nonacute. Echo again shows normal LV function. Again, in the absence of symptoms and given comorbidities and dementia, we will defer any further ischemic evaluation at this time. Continue medical therapy including statin. He is intolerant to aspirin. Could consider Plavix especially in light of prior stroke/TIA.  2. Essential hypertension: Patient is not on any antihypertensives at home and in fact, requires midodrine. Blood pressure is currently stable.  3. End-stage renal disease: Monday Wednesday Friday dialysis per nephrology.  4. Peripheral arterial disease: Status post multiple interventions. He is followed at [facility]. He does have a small area on his left heel that he says is bothering him. I will put in for wound consult.  5. Diabetes mellitus: Glucose 114 this morning. He remains on sliding scale insulin.  Signed, Murray Hodgkins, NP 01/03/2017, 1:43 PM

## 2017-01-03 NOTE — Progress Notes (Signed)
ANTICOAGULATION CONSULT NOTE - Initial Consult  Pharmacy Consult for heparin drip Indication: chest pain/ACS  Allergies  Allergen Reactions  . Aspirin Other (See Comments)    Bleeding   . Bc Powder [Aspirin-Salicylamide-Caffeine]     MAR did not specify a reaction  . Caffeine     MAR did not specify a reaction  . 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  . Ramipril Other (See Comments)    Imported from [facility] - reaction: unknown    Patient Measurements: Height: 5\' 11"  (180.3 cm) Weight: 170 lb (77.1 kg) IBW/kg (Calculated) : 75.3 Heparin Dosing Weight: 77 kg  Vital Signs: Temp: 97.5 F (36.4 C) (09/21 0046) Temp Source: Oral (09/21 0046) BP: 132/62 (09/21 0046) Pulse Rate: 68 (09/21 0046)  Labs:  Recent Labs  01/02/17 1953 01/02/17 2135 01/03/17 0237  HGB 13.1  --   --   HCT 39.3*  --   --   PLT 156  --   --   APTT  --   --  33  LABPROT  --   --  12.6  INR  --   --  0.95  CREATININE  --  7.79*  --   TROPONINI  --  1.54* 2.11*    Estimated Creatinine Clearance: 7.4 mL/min (A) (by C-G formula based on SCr of 7.79 mg/dL (H)).   Medical History: Past Medical History:  Diagnosis Date  . Anemia of chronic disease   . Chronic pain disorder   . Dementia   . Depression   . ESRD on hemodialysis ([place])    a. MWF  . Falls frequently   . GERD (gastroesophageal reflux disease)   . Hypertension   . Insulin dependent diabetes mellitus with complications ([place])   . Mood disorder ([place])   . Peripheral  neuropathy    a. diabetic neuropathy  . Peripheral vascular disease ([place])    a. s/p prior toe amputation; b. followed by [facility]  . Prostate cancer ([place])   . Seizures ([place])    EPILEPSY  . Stroke [facility])    TIA  . Weakness generalized     Medications:  No anticoagulation in PTA meds.  Assessment:  Goal of Therapy:  Heparin level 0.3-0.7 units/ml Monitor platelets by anticoagulation protocol: Yes   Plan:  4000 unit bolus and initial rate of 900 units/hr. First heparin level 8 hours after start of infusion.  Zari Cly S 01/03/2017,3:48 AM

## 2017-01-03 NOTE — Care Management (Signed)
Placed in observation from after being found unresponsive at [facility].  Elevated troponin.   Chronic dialysis, MWF [facility] [place] .  Notified Elvera Bicker with [employer].  It was discussed during progression that it is unlikely that patient will undergo any cardiac workup and anticipate will discharge back to the facility later today.

## 2017-01-03 NOTE — NC FL2 (Signed)
[place] LEVEL OF CARE SCREENING TOOL     IDENTIFICATION  Patient Name: Michael Acosta Birthdate: Aug 06, 1929 Sex: male Admission Date (Current Location): 01/02/2017  [place] and [place] Number:  Engineering geologist and Address:  [facility], [address], [place], [place] [address]      Provider Number: [id number]  Attending Physician Name and Address:  Epifanio Lesches, MD  Relative Name and Phone Number:  Riddick,Valerie Daughter [phone] or [place], Keady [phone]     Current Level of Care: Hospital Recommended Level of Care: [place] Prior Approval Number:    Date Approved/Denied:   PASRR Number: [id number]  Discharge Plan: SNF    Current Diagnoses: Patient Active Problem List   Diagnosis Date Noted  . HTN (hypertension) 01/02/2017  . GERD (gastroesophageal reflux disease) 01/02/2017  . Hematochezia   . Acute encephalopathy   . Atherosclerosis of native arteries of the extremities with gangrene ([place]) 11/05/2016  . Acute gallstone pancreatitis 10/04/2016  . Weakness generalized   . Cholecystitis 09/25/2016  . NSTEMI (non-ST elevated myocardial infarction) ([place]) 09/25/2016  . ESRD on hemodialysis ([place]) 09/25/2016  . Dementia 09/25/2016  . PVD (peripheral vascular disease) ([place]) 09/25/2016  . IDDM (insulin dependent diabetes mellitus) ([place]) 09/25/2016  . Bacteremia 09/25/2016  . DNR (do not resuscitate) discussion 09/25/2016  . Palliative care by specialist 09/25/2016  . Sepsis ([place])   . Septic shock ([place]) 09/24/2016  . CAP (community acquired pneumonia) 04/13/2016    Orientation RESPIRATION BLADDER Height & Weight     Self  Normal Continent Weight: 181 lb 10.5 oz (82.4 kg) Height:  5\' 11"  (180.3 cm)  BEHAVIORAL SYMPTOMS/MOOD NEUROLOGICAL BOWEL NUTRITION STATUS      Continent Diet  AMBULATORY STATUS COMMUNICATION OF NEEDS Skin   Limited Assist Verbally Surgical wounds                       Personal Care Assistance Level of Assistance  Bathing, Feeding, Dressing Bathing Assistance: Limited assistance Feeding assistance: Limited assistance Dressing Assistance: Limited assistance     Functional Limitations Info  Sight, Hearing, Speech Sight Info: Adequate Hearing Info: Adequate Speech Info: Adequate    SPECIAL CARE FACTORS FREQUENCY                       Contractures Contractures Info: Not present    Additional Factors Info  Code Status, Insulin Sliding Scale, Isolation Precautions Code Status Info: DNR     Insulin Sliding Scale Info: Insulin 3x a day with meals Isolation Precautions Info: Contact precautions due to MRSA     Current Medications (01/03/2017):  This is the current hospital active medication list Current Facility-Administered Medications  Medication Dose Route Frequency Provider Last Rate Last Dose  . acetaminophen (TYLENOL) tablet 650 mg  650 mg Oral Q6H PRN Lance Coon, MD       Or  . acetaminophen (TYLENOL) suppository 650 mg  650 mg Rectal Q6H PRN Lance Coon, MD      . atorvastatin (LIPITOR) tablet 20 mg  20 mg Oral Corwin Levins, MD      . Chlorhexidine Gluconate Cloth 2 % PADS 6 each  6 each Topical Q0600 Lance Coon, MD   6 each at 01/03/17 [phone]  . gabapentin (NEURONTIN) capsule 100 mg  100 mg Oral QHS Lance Coon, MD      . heparin ADULT infusion 100 units/mL (25000 units/254mL sodium chloride 0.45%)  900  Units/hr Intravenous Continuous Lance Coon, MD 9 mL/hr at 01/03/17 0423 900 Units/hr at 01/03/17 0423  . insulin aspart (novoLOG) injection 0-9 Units  0-9 Units Subcutaneous Q6H Lance Coon, MD      . lamoTRIgine (LAMICTAL) tablet 100 mg  100 mg Oral Corwin Levins, MD      . lamoTRIgine (LAMICTAL) tablet 50 mg  50 mg Oral Daily Lance Coon, MD   50 mg at 01/03/17 1133  . levETIRAcetam (KEPPRA) tablet 500 mg  500 mg Oral Daily Lance Coon, MD   500 mg at 01/03/17 1133  . metoCLOPramide  (REGLAN) tablet 5 mg  5 mg Oral TID Kenn File, MD   5 mg at 01/03/17 1134  . midodrine (PROAMATINE) tablet 5 mg  5 mg Oral TID WC Lance Coon, MD   5 mg at 01/03/17 1756  . mupirocin ointment (BACTROBAN) 2 % 1 application  1 application Nasal BID Lance Coon, MD   1 application at 74/08/14 1136  . pantoprazole (PROTONIX) EC tablet 40 mg  40 mg Oral Daily Lance Coon, MD   40 mg at 01/03/17 1132  . sevelamer carbonate (RENVELA) tablet 800 mg  800 mg Oral TID Lance Coon, MD   800 mg at 01/03/17 1132     Discharge Medications: Please see discharge summary for a list of discharge medications.  Relevant Imaging Results:  Relevant Lab Results:   Additional Information SS# [id number] Dialysis patient MWF at [facility] in [place] at 10:30am  [place], Jones Broom, LCSWA

## 2017-01-03 NOTE — Progress Notes (Signed)
HD STARTED  

## 2017-01-03 NOTE — Progress Notes (Signed)
[facility]  ROUNDING NOTE   Subjective:  Patient well known to [place] from last admission. He was quite lethargic and difficult to arouse this a.m. He was also having a 2-D echocardiogram performed. Troponin was a bit elevated upon admission. He presented similarly at the last admission with encephalopathy.   Objective:  Vital signs in last 24 hours:  Temp:  [97.5 F (36.4 C)-97.8 F (36.6 C)] 97.5 F (36.4 C) (09/21 0815) Pulse Rate:  [62-87] 74 (09/21 1144) Resp:  [13-21] 14 (09/21 1144) BP: (96-132)/(47-73) 117/47 (09/21 1144) SpO2:  [92 %-98 %] 98 % (09/21 1144) Weight:  [77.1 kg (170 lb)-82.1 kg (181 lb)] 82.1 kg (181 lb) (09/21 0443)  Weight change:  Filed Weights   01/02/17 1945 01/03/17 0046 01/03/17 0443  Weight: 77.1 kg (170 lb) 82.1 kg (181 lb) 82.1 kg (181 lb)    Intake/Output: I/O last 3 completed shifts: In: 23.6 [I.V.:23.6] Out: 0    Intake/Output this shift:  No intake/output data recorded.  Physical Exam: General: No acute distress  Head: Normocephalic, atraumatic. Moist oral mucosal membranes  Eyes: Anicteric  Neck: Supple, trachea midline  Lungs:  Clear to auscultation, normal effort  Heart: S1S2 no rubs  Abdomen:  Soft, nontender, bowel sounds present  Extremities: No peripheral edema.  Neurologic: Lethargic and difficult to arouse   Skin: No lesions  Access: LUE AVF    Basic Metabolic Panel:  Recent Labs Lab 01/02/17 2135 01/03/17 0931  NA 136 139  K 4.0 4.2  CL 96* 97*  CO2 23 25  GLUCOSE 192* 114*  BUN 80* 88*  CREATININE 7.79* 8.60*  CALCIUM 8.9 9.0    Liver Function Tests:  Recent Labs Lab 01/02/17 2135  AST 27  ALT 25  ALKPHOS 123  BILITOT 0.8  PROT 7.8  ALBUMIN 4.0   No results for input(s): LIPASE, AMYLASE in the last 168 hours.  Recent Labs Lab 01/02/17 2135  AMMONIA <9*    CBC:  Recent Labs Lab 01/02/17 1953 01/03/17 0931  WBC 10.7* 7.7  HGB 13.1 13.9  HCT 39.3* 42.7  MCV 95.6 96.6   PLT 156 160    Cardiac Enzymes:  Recent Labs Lab 01/02/17 2135 01/03/17 0237 01/03/17 0931  TROPONINI 1.54* 2.11* 2.04*    BNP: Invalid input(s): POCBNP  CBG:  Recent Labs Lab 01/02/17 2002 01/03/17 0518 01/03/17 0900 01/03/17 1144  GLUCAP 177* 132* 82* 45*    Microbiology: Results for orders placed or performed during the hospital encounter of 01/02/17  MRSA PCR Screening     Status: Abnormal   Collection Time: 01/03/17  1:00 AM  Result Value Ref Range Status   MRSA by PCR POSITIVE (A) NEGATIVE Final    Comment:        The GeneXpert MRSA Assay (FDA approved for NASAL specimens only), is one component of a comprehensive MRSA colonization surveillance program. It is not intended to diagnose MRSA infection nor to guide or monitor treatment for MRSA infections. RESULT CALLED TO, READ BACK BY AND VERIFIED WITH: MARCEL TURNER @ 0227 ON 01/03/2017 BY CAF     Coagulation Studies:  Recent Labs  01/03/17 0237  LABPROT 12.6  INR 0.95    Urinalysis: No results for input(s): COLORURINE, LABSPEC, PHURINE, GLUCOSEU, HGBUR, BILIRUBINUR, KETONESUR, PROTEINUR, UROBILINOGEN, NITRITE, LEUKOCYTESUR in the last 72 hours.  Invalid input(s): APPERANCEUR    Imaging: Dg Chest 2 View  Result Date: 01/02/2017 CLINICAL DATA:  Unresponsive. EXAM: CHEST  2 VIEW COMPARISON:  Radiograph  of November 15, 2016. FINDINGS: Stable cardiomediastinal silhouette. Atherosclerosis of thoracic aorta is noted. No pneumothorax is noted. Right lung is clear. Elevated left hemidiaphragm is noted with mild left basilar subsegmental atelectasis. Minimal left pleural effusion is noted. Bony thorax is unremarkable. IMPRESSION: Aortic atherosclerosis. Elevated left hemidiaphragm with mild left basilar subsegmental atelectasis. Minimal left pleural effusion is noted. Electronically Signed   By: Marijo Conception, M.D.   On: 01/02/2017 20:35   Ct Head Wo Contrast  Result Date: 01/02/2017 CLINICAL DATA:   Altered level of consciousness. EXAM: CT HEAD WITHOUT CONTRAST TECHNIQUE: Contiguous axial images were obtained from the base of the skull through the vertex without intravenous contrast. COMPARISON:  CT scan of November 15, 2016. FINDINGS: Brain: Mild diffuse cortical atrophy is noted. Mild chronic ischemic white matter disease is noted. Old basal ganglia lacunar infarctions are noted bilaterally. No mass effect or midline shift is noted. Ventricular size is within normal limits. There is no evidence of mass lesion, hemorrhage or acute infarction. Old right occipital infarction is noted. Vascular: No hyperdense vessel or unexpected calcification. Skull: Normal. Negative for fracture or focal lesion. Sinuses/Orbits: No acute finding. Other: None. IMPRESSION: Mild diffuse cortical atrophy. Mild chronic ischemic white matter disease. No acute intracranial abnormality seen. Electronically Signed   By: Marijo Conception, M.D.   On: 01/02/2017 20:21     Medications:   . heparin 900 Units/hr (01/03/17 0423)   . atorvastatin  20 mg Oral QHS  . Chlorhexidine Gluconate Cloth  6 each Topical Q0600  . gabapentin  100 mg Oral QHS  . insulin aspart  0-9 Units Subcutaneous Q6H  . lamoTRIgine  100 mg Oral QHS  . lamoTRIgine  50 mg Oral Daily  . levETIRAcetam  500 mg Oral Daily  . metoCLOPramide  5 mg Oral TID AC  . midodrine  5 mg Oral TID WC  . mupirocin ointment  1 application Nasal BID  . pantoprazole  40 mg Oral Daily  . sevelamer carbonate  800 mg Oral TID   acetaminophen **OR** acetaminophen  Assessment/ Plan:  83 y.o. male with end-stage renal disease on hemodialysis, hypertension, diabetes type 2, insulin-dependent, seizure disorder, peripheral neuropathy, vascular dementia  MWF [facility] [place] Left arm AVF  1. End Stage Renal Disease: MWF.  - Patient due for hemodialysis today. We have prepared dialysis orders.  2. AOCKD - Hemoglobin quite high at 13.9. Hold off on Epogen at this  time.  3. SHPTH - Recheck phosphorus with dialysis today.  4. Altered mental status. Patient lethargic and difficult to arouse. CT head was negative for any acute findings. Blood sugar was acceptable this a.m.  5. Elevated troponin. Troponin currently 2.04. Further workup and evaluation as per hospitalist.    LOS: 0 Constancia Geeting 9/21/201812:06 PM

## 2017-01-03 NOTE — Progress Notes (Signed)
*  PRELIMINARY RESULTS* Echocardiogram 2D Echocardiogram has been performed.  [name] 01/03/2017, 9:31 AM

## 2017-01-03 NOTE — Progress Notes (Signed)
PRE DIALYSIS ASSESSMENT 

## 2017-01-04 LAB — BLOOD GAS, ARTERIAL
ACID-BASE DEFICIT: 0.6 mmol/L (ref 0.0–2.0)
Bicarbonate: 25.4 mmol/L (ref 20.0–28.0)
FIO2: 0.21
O2 SAT: 93.1 %
PATIENT TEMPERATURE: 37
PCO2 ART: 46 mmHg (ref 32.0–48.0)
pH, Arterial: 7.35 (ref 7.350–7.450)
pO2, Arterial: 71 mmHg — ABNORMAL LOW (ref 83.0–108.0)

## 2017-01-04 LAB — GLUCOSE, CAPILLARY
GLUCOSE-CAPILLARY: 255 mg/dL — AB (ref 65–99)
GLUCOSE-CAPILLARY: 144 mg/dL — AB (ref 65–99)
Glucose-Capillary: 232 mg/dL — ABNORMAL HIGH (ref 65–99)
Glucose-Capillary: 171 mg/dL — ABNORMAL HIGH (ref 65–99)
Glucose-Capillary: 148 mg/dL — ABNORMAL HIGH (ref 65–99)

## 2017-01-04 LAB — HEPARIN LEVEL (UNFRACTIONATED): HEPARIN UNFRACTIONATED: 0.28 [IU]/mL — AB (ref 0.30–0.70)

## 2017-01-04 MED ORDER — MIDODRINE HCL 5 MG PO TABS
5.0000 mg | ORAL_TABLET | Freq: Three times a day (TID) | ORAL | Status: DC
  Administered 2017-01-04 – 2017-01-05 (×2): 5 mg via ORAL
  Filled 2017-01-04 (×2): qty 1

## 2017-01-04 MED ORDER — HEPARIN (PORCINE) IN NACL 100-0.45 UNIT/ML-% IJ SOLN
1200.0000 [IU]/h | INTRAMUSCULAR | Status: DC
  Administered 2017-01-04: 1200 [IU]/h via INTRAVENOUS

## 2017-01-04 MED ORDER — MIDODRINE HCL 5 MG PO TABS
10.0000 mg | ORAL_TABLET | Freq: Three times a day (TID) | ORAL | Status: DC
  Filled 2017-01-04: qty 2

## 2017-01-04 MED ORDER — HEPARIN SODIUM (PORCINE) 5000 UNIT/ML IJ SOLN
5000.0000 [IU] | Freq: Three times a day (TID) | INTRAMUSCULAR | Status: DC
  Administered 2017-01-04 – 2017-01-05 (×3): 5000 [IU] via SUBCUTANEOUS
  Filled 2017-01-04 (×3): qty 1

## 2017-01-04 MED ORDER — INSULIN ASPART 100 UNIT/ML ~~LOC~~ SOLN: 0.0000 [IU] | Freq: Every day | SUBCUTANEOUS | Status: DC

## 2017-01-04 MED ORDER — INSULIN ASPART 100 UNIT/ML ~~LOC~~ SOLN
0.0000 [IU] | Freq: Three times a day (TID) | SUBCUTANEOUS | Status: DC
  Administered 2017-01-04: 3 [IU] via SUBCUTANEOUS
  Administered 2017-01-04: 2 [IU] via SUBCUTANEOUS
  Filled 2017-01-04 (×4): qty 1

## 2017-01-04 MED ORDER — HEPARIN BOLUS VIA INFUSION
1250.0000 [IU] | Freq: Once | INTRAVENOUS | Status: AC
  Administered 2017-01-04: 1250 [IU] via INTRAVENOUS
  Filled 2017-01-04: qty 1250

## 2017-01-04 NOTE — Progress Notes (Signed)
ANTICOAGULATION CONSULT NOTE - Initial Consult  Pharmacy Consult for heparin drip Indication: chest pain/ACS  Patient Measurements: Height: 5\' 11"  (180.3 cm) Weight: 185 lb (83.9 kg) IBW/kg (Calculated) : 75.3 Heparin Dosing Weight: 84 kg  Vital Signs: Temp: 98.1 F (36.7 C) (09/22 0347) Temp Source: Oral (09/22 0347) BP: 95/45 (09/22 0630) Pulse Rate: 67 (09/22 0347)  Labs:  Recent Labs  01/02/17 1953  01/02/17 2135 01/03/17 0237 01/03/17 0931 01/03/17 1600 01/03/17 1640 01/03/17 1850 01/04/17 0550  HGB 13.1  --   --   --  13.9  --   --   --   --   HCT 39.3*  --   --   --  42.7  --   --   --   --   PLT 156  --   --   --  160  --   --   --   --   APTT  --   --   --  33  --   --   --   --   --   LABPROT  --   --   --  12.6  --   --   --   --   --   INR  --   --   --  0.95  --   --   --   --   --   HEPARINUNFRC  --   --   --   --   --   --  0.21*  --  0.28*  CREATININE  --   --  7.79*  --  8.60*  --   --   --   --   TROPONINI  --   < > 1.54* 2.11* 2.04* 1.61*  --  1.57*  --   < > = values in this interval not displayed.   Medications: No anticoagulation in PTA meds  Assessment: Pharmacy consulted to dose and monitor heparin drip in this 83 year old male for ACS with elevated troponin. Per cardiology notes, is not a good candidate for cardiac cath, continue heparin at this time.  Goal of Therapy:  Heparin level 0.3-0.7 units/ml Monitor platelets by anticoagulation protocol: Yes   Plan:  HL = 0.28 is subtherapeutic. Will bolus 1250 units and increase heparin infusion to 1200 units/hr. Will recheck HL in 8 hours and CBC with AM labs tomorrow.  Lenis Noon, PharmD, BCPS Clinical Pharmacist 01/04/2017,8:26 AM

## 2017-01-04 NOTE — Progress Notes (Signed)
[place] at [place] NAME: Michael Acosta    MR#:  [id number]  DATE OF BIRTH:  Oct 31, 1929  SUBJECTIVE:   Patient here due to altered mental status and ruled in for a non-ST elevation MI. Blood pressure improved since yesterday, patient denies any chest pain, shortness of breath.  REVIEW OF SYSTEMS:    Review of Systems  Constitutional: Negative for chills and fever.  HENT: Negative for congestion and tinnitus.   Eyes: Negative for blurred vision and double vision.  Respiratory: Negative for cough, shortness of breath and wheezing.   Cardiovascular: Negative for chest pain, orthopnea and PND.  Gastrointestinal: Negative for abdominal pain, diarrhea, nausea and vomiting.  Genitourinary: Negative for dysuria and hematuria.  Neurological: Negative for dizziness, sensory change and focal weakness.  All other systems reviewed and are negative.   Nutrition: Renal Tolerating Diet: Yes Tolerating PT: Await Eval.   DRUG ALLERGIES:   Allergies  Allergen Reactions  . Aspirin Other (See Comments)    Bleeding   . Bc Powder [Aspirin-Salicylamide-Caffeine]     MAR did not specify a reaction  . Caffeine     MAR did not specify a reaction  . 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  . Ramipril Other (See Comments)    Imported from [facility] - reaction: unknown    VITALS:  Blood pressure (!) 131/58, pulse 74, temperature 98.3 F (36.8 C), temperature source Oral, resp. rate 20, height 5'  11" (1.803 m), weight 83.9 kg (185 lb), SpO2 95 %.  PHYSICAL EXAMINATION:   Physical Exam  GENERAL:  83 y.o.-year-old patient lying in bed in no acute distress.  EYES: Pupils equal, round, reactive to light and accommodation. No scleral icterus. Extraocular muscles intact.  HEENT: Head atraumatic, normocephalic. Oropharynx and nasopharynx clear.  NECK:  Supple, no jugular venous distention. No thyroid enlargement, no tenderness.  LUNGS: Normal breath sounds bilaterally, no wheezing, rales, rhonchi. No use of accessory muscles of respiration.  CARDIOVASCULAR: S1, S2 normal. No murmurs, rubs, or gallops.  ABDOMEN: Soft, nontender, nondistended. Bowel sounds present. No organomegaly or mass.  EXTREMITIES: No cyanosis, clubbing or edema b/l.    NEUROLOGIC: Cranial nerves II through XII are intact. No focal Motor or sensory deficits b/l.  Globally weak PSYCHIATRIC: The patient is alert and oriented x 3.  SKIN: No obvious rash, lesion, or ulcer.   Left upper ext. AV fistula with good bruit/thrill.   LABORATORY PANEL:   CBC  Recent Labs Lab 01/03/17 0931  WBC 7.7  HGB 13.9  HCT 42.7  PLT 160   ------------------------------------------------------------------------------------------------------------------  Chemistries   Recent Labs Lab 01/02/17 2135 01/03/17 0931  NA 136 139  K 4.0 4.2  CL 96* 97*  CO2 23 25  GLUCOSE 192* 114*  BUN 80* 88*  CREATININE 7.79* 8.60*  CALCIUM 8.9 9.0  AST 27  --   ALT 25  --   ALKPHOS 123  --   BILITOT 0.8  --    ------------------------------------------------------------------------------------------------------------------  Cardiac Enzymes  Recent Labs Lab 01/03/17 1850  TROPONINI 1.57*   ------------------------------------------------------------------------------------------------------------------  RADIOLOGY:  Dg Chest 2 View  Result Date: 01/02/2017 CLINICAL DATA:  Unresponsive. EXAM: CHEST  2 VIEW COMPARISON:   Radiograph of November 15, 2016. FINDINGS: Stable cardiomediastinal silhouette. Atherosclerosis of thoracic aorta is noted. No pneumothorax is noted. Right lung is clear. Elevated left hemidiaphragm is noted with mild left basilar subsegmental atelectasis. Minimal left pleural effusion is noted. Bony thorax is unremarkable. IMPRESSION: Aortic atherosclerosis. Elevated left hemidiaphragm with mild left basilar subsegmental atelectasis. Minimal left pleural effusion is noted. Electronically Signed   By: Marijo Conception, M.D.   On: 01/02/2017 20:35   Ct Head Wo Contrast  Result Date: 01/02/2017 CLINICAL DATA:  Altered level of consciousness. EXAM: CT HEAD WITHOUT CONTRAST TECHNIQUE: Contiguous axial images were obtained from the base of the skull through the vertex without intravenous contrast. COMPARISON:  CT scan of November 15, 2016. FINDINGS: Brain: Mild diffuse cortical atrophy is noted. Mild chronic ischemic white matter disease is noted. Old basal ganglia lacunar infarctions are noted bilaterally. No mass effect or midline shift is noted. Ventricular size is within normal limits. There is no evidence of mass lesion, hemorrhage or acute infarction. Old right occipital infarction is noted. Vascular: No hyperdense vessel or unexpected calcification. Skull: Normal. Negative for fracture or focal lesion. Sinuses/Orbits: No acute finding. Other: None. IMPRESSION: Mild diffuse cortical atrophy. Mild chronic ischemic white matter disease. No acute intracranial abnormality seen. Electronically Signed   By: Marijo Conception, M.D.   On: 01/02/2017 20:21     ASSESSMENT AND PLAN:   83 yo male with past medical history of end-stage renal disease on hemodialysis, chronic hypotension,seizures, history of previous CVA, peripheral neuropathy, GERD depression, dementia who presented to the hospital due to altered mental status and noted to have elevated troponin.  1. Altered mental status-CT head on admission was negative for  acute pathology. No evidence of infectious source. Patient was relatively hypotensive and therefore this could've contributed to the patient's mental status change. The pressure has improved with some fluids and Midodrine and mental status is improving.   2. Chronic Hypotension - cont. Midodrine and BP is improving.   3. Elevated Trop - ?? NSTEMI/demand ischemia.  - seen by Cardiology and as per them this is demand ischemia due to hypotension, hx of CAD.  Pt. Is Asymptomatic.  - Echo showing normal EF.  - was on Heparin gtt and will d/c as Trop are trending down.    4. DM Type II w/ renal complications and dialysis - cont. SSI  5. Hx of Seizures - cont. Keppra, Lamictal  6. Neuropathy - cont. Neurontin  7. Hyperlipidemia - cont. Atorvastatin  8. ESRD on HD - cont. Dialysis on MWF. Nephrology following and had HD yesterday.  No need for HD today.   9. Secondary Hyperparathyroidism - cont. Renvela.   10. GERD - cont. Protonix.   Possible d/c back to [facility] tomorrow.    All the records are reviewed and case discussed with Care Management/Social Worker. Management plans discussed with the patient, family and they are in agreement.  CODE STATUS: DNR  DVT Prophylaxis: Hep. SQ  TOTAL TIME TAKING CARE OF THIS PATIENT: 30 minutes.   POSSIBLE D/C IN 1-2 DAYS, DEPENDING ON CLINICAL CONDITION.   Henreitta Leber M.D on 01/04/2017 at 2:40 PM  Between 7am to 6pm - Pager - [phone]  After 6pm go to [email or web address] - [facility]  Office  [phone]  CC:  Primary care physician; Juluis Pitch, MD

## 2017-01-04 NOTE — Progress Notes (Signed)
[facility]  ROUNDING NOTE   Subjective:  Patient did complete hemodialysis yesterday. Most recent troponin was down to 1.5. Was given midodrine yesterday with improvement in blood pressure.   Objective:  Vital signs in last 24 hours:  Temp:  [97.9 F (36.6 C)-98.2 F (36.8 C)] 98.1 F (36.7 C) (09/22 0347) Pulse Rate:  [59-88] 88 (09/22 0849) Resp:  [0-20] 20 (09/22 0849) BP: (70-117)/(42-68) 98/68 (09/22 0849) SpO2:  [90 %-99 %] 90 % (09/22 0849) Weight:  [82.4 kg (181 lb 10.5 oz)-83.9 kg (185 lb)] 83.9 kg (185 lb) (09/22 0347)  Weight change: 5.288 kg (11 lb 10.5 oz) Filed Weights   01/03/17 0443 01/03/17 1600 01/04/17 0347  Weight: 82.1 kg (181 lb) 82.4 kg (181 lb 10.5 oz) 83.9 kg (185 lb)    Intake/Output: I/O last 3 completed shifts: In: 359.5 [P.O.:118; I.V.:241.5] Out: 0    Intake/Output this shift:  No intake/output data recorded.  Physical Exam: General: No acute distress  Head: Normocephalic, atraumatic. Moist oral mucosal membranes  Eyes: Anicteric  Neck: Supple, trachea midline  Lungs:  Clear to auscultation, normal effort  Heart: S1S2 no rubs  Abdomen:  Soft, nontender, bowel sounds present  Extremities: No peripheral edema.  Neurologic: Awake, alert, follows commands this AM.  Skin: No lesions  Access: LUE AVF    Basic Metabolic Panel:  Recent Labs Lab 01/02/17 2135 01/03/17 0931  NA 136 139  K 4.0 4.2  CL 96* 97*  CO2 23 25  GLUCOSE 192* 114*  BUN 80* 88*  CREATININE 7.79* 8.60*  CALCIUM 8.9 9.0  PHOS  --  4.5    Liver Function Tests:  Recent Labs Lab 01/02/17 2135  AST 27  ALT 25  ALKPHOS 123  BILITOT 0.8  PROT 7.8  ALBUMIN 4.0   No results for input(s): LIPASE, AMYLASE in the last 168 hours.  Recent Labs Lab 01/02/17 2135  AMMONIA <9*    CBC:  Recent Labs Lab 01/02/17 1953 01/03/17 0931  WBC 10.7* 7.7  HGB 13.1 13.9  HCT 39.3* 42.7  MCV 95.6 96.6  PLT 156 160    Cardiac  Enzymes:  Recent Labs Lab 01/02/17 2135 01/03/17 0237 01/03/17 0931 01/03/17 1600 01/03/17 1850  TROPONINI 1.54* 2.11* 2.04* 1.61* 1.57*    BNP: Invalid input(s): POCBNP  CBG:  Recent Labs Lab 01/03/17 0900 01/03/17 1144 01/03/17 1850 01/04/17 0118 01/04/17 0755  GLUCAP 124* 119* 153* 255* 144*    Microbiology: Results for orders placed or performed during the hospital encounter of 01/02/17  MRSA PCR Screening     Status: Abnormal   Collection Time: 01/03/17  1:00 AM  Result Value Ref Range Status   MRSA by PCR POSITIVE (A) NEGATIVE Final    Comment:        The GeneXpert MRSA Assay (FDA approved for NASAL specimens only), is one component of a comprehensive MRSA colonization surveillance program. It is not intended to diagnose MRSA infection nor to guide or monitor treatment for MRSA infections. RESULT CALLED TO, READ BACK BY AND VERIFIED WITH: MARCEL TURNER @ 0227 ON 01/03/2017 BY CAF     Coagulation Studies:  Recent Labs  01/03/17 0237  LABPROT 12.6  INR 0.95    Urinalysis: No results for input(s): COLORURINE, LABSPEC, PHURINE, GLUCOSEU, HGBUR, BILIRUBINUR, KETONESUR, PROTEINUR, UROBILINOGEN, NITRITE, LEUKOCYTESUR in the last 72 hours.  Invalid input(s): APPERANCEUR    Imaging: Dg Chest 2 View  Result Date: 01/02/2017 CLINICAL DATA:  Unresponsive. EXAM: CHEST  2  VIEW COMPARISON:  Radiograph of November 15, 2016. FINDINGS: Stable cardiomediastinal silhouette. Atherosclerosis of thoracic aorta is noted. No pneumothorax is noted. Right lung is clear. Elevated left hemidiaphragm is noted with mild left basilar subsegmental atelectasis. Minimal left pleural effusion is noted. Bony thorax is unremarkable. IMPRESSION: Aortic atherosclerosis. Elevated left hemidiaphragm with mild left basilar subsegmental atelectasis. Minimal left pleural effusion is noted. Electronically Signed   By: Marijo Conception, M.D.   On: 01/02/2017 20:35   Ct Head Wo  Contrast  Result Date: 01/02/2017 CLINICAL DATA:  Altered level of consciousness. EXAM: CT HEAD WITHOUT CONTRAST TECHNIQUE: Contiguous axial images were obtained from the base of the skull through the vertex without intravenous contrast. COMPARISON:  CT scan of November 15, 2016. FINDINGS: Brain: Mild diffuse cortical atrophy is noted. Mild chronic ischemic white matter disease is noted. Old basal ganglia lacunar infarctions are noted bilaterally. No mass effect or midline shift is noted. Ventricular size is within normal limits. There is no evidence of mass lesion, hemorrhage or acute infarction. Old right occipital infarction is noted. Vascular: No hyperdense vessel or unexpected calcification. Skull: Normal. Negative for fracture or focal lesion. Sinuses/Orbits: No acute finding. Other: None. IMPRESSION: Mild diffuse cortical atrophy. Mild chronic ischemic white matter disease. No acute intracranial abnormality seen. Electronically Signed   By: Marijo Conception, M.D.   On: 01/02/2017 20:21     Medications:    . atorvastatin  20 mg Oral QHS  . Chlorhexidine Gluconate Cloth  6 each Topical Q0600  . gabapentin  100 mg Oral QHS  . insulin aspart  0-5 Units Subcutaneous QHS  . insulin aspart  0-9 Units Subcutaneous TID WC  . lamoTRIgine  100 mg Oral QHS  . lamoTRIgine  50 mg Oral Daily  . levETIRAcetam  500 mg Oral Daily  . metoCLOPramide  5 mg Oral TID AC  . midodrine  5 mg Oral TID WC  . mupirocin ointment  1 application Nasal BID  . pantoprazole  40 mg Oral Daily  . sevelamer carbonate  800 mg Oral TID   acetaminophen **OR** acetaminophen  Assessment/ Plan:  83 y.o. male with end-stage renal disease on hemodialysis, hypertension, diabetes type 2, insulin-dependent, seizure disorder, peripheral neuropathy, vascular dementia  MWF [facility] Left arm AVF  1. End Stage Renal Disease: MWF.  - Patient had hemodialysis yesterday. No acute indication for dialysis today.  2.  AOCKD - Continue to hold Epogen for now given hemoglobin of 13.9 at last check.  3. SHPTH - Phosphorus currently at target of 4.5. Continue Renvela with meals.  4. Altered mental status.  Mental status appears to be improved this a.m.  5. Elevated troponin. Troponin down to 1.57. Evaluation and management per hospitalist.    LOS: 1 Michael Acosta 9/22/201810:12 AM

## 2017-01-05 LAB — CBC
HCT: 36.8 % — ABNORMAL LOW (ref 40.0–52.0)
HEMOGLOBIN: 12.2 g/dL — AB (ref 13.0–18.0)
MCH: 31.4 pg (ref 26.0–34.0)
MCHC: 33.1 g/dL (ref 32.0–36.0)
MCV: 94.7 fL (ref 80.0–100.0)
Platelets: 142 10*3/uL — ABNORMAL LOW (ref 150–440)
RBC: 3.89 MIL/uL — AB (ref 4.40–5.90)
RDW: 15.1 % — AB (ref 11.5–14.5)
WBC: 7.4 10*3/uL (ref 3.8–10.6)

## 2017-01-05 LAB — GLUCOSE, CAPILLARY
GLUCOSE-CAPILLARY: 125 mg/dL — AB (ref 65–99)
GLUCOSE-CAPILLARY: 149 mg/dL — AB (ref 65–99)
GLUCOSE-CAPILLARY: 150 mg/dL — AB (ref 65–99)
Glucose-Capillary: 240 mg/dL — ABNORMAL HIGH (ref 65–99)

## 2017-01-05 LAB — PARATHYROID HORMONE, INTACT (NO CA): PTH: 113 pg/mL — AB (ref 15–65)

## 2017-01-05 LAB — TROPONIN I: Troponin I: 1.97 ng/mL (ref <0.03)

## 2017-01-05 LAB — HEPATITIS B SURFACE ANTIGEN: Hepatitis B Surface Ag: NEGATIVE

## 2017-01-05 MED ORDER — ASPIRIN EC 81 MG PO TBEC: 81.0000 mg | DELAYED_RELEASE_TABLET | Freq: Every day | ORAL | Status: AC

## 2017-01-05 NOTE — Progress Notes (Signed)
Discharged back to [facility] via EMS.  Report called.  Patient is known to staff.

## 2017-01-05 NOTE — Plan of Care (Signed)
Problem: Education: Goal: Knowledge of  General Education information/materials will improve Outcome: Adequate for Discharge He and his wife are aware of health changes and symptoms for look out for.

## 2017-01-05 NOTE — Consult Note (Signed)
[facility] Nurse wound consult note Reason for Consult: Patient seen on day of discharge; patient transport has already been called for return to facility. Wound type: Arterial insufficiency Pressure Injury POA: N/A Measurement: distal tip of 2nd right toe with stable, dry eschar Wound bed:As described above Drainage (amount, consistency, odor) None Periwound:intact, dry Dressing procedure/placement/frequency: I will provide discharge orders for wound care at the facility to pain the stable, dry eschar with a betadine swabstick twice daily to prevent infection and keep the eschar dry and stable-acting as a biologic dressing. Caution should be taken to prevent/protect from injury These eschars occasionally autodebride to reveal intact skin, and should be monitored. [place] nursing team will not follow, but will remain available to this patient, the nursing and medical teams.  Please re-consult if needed. Thanks, Maudie Flakes, MSN, RN, [place], Arther Abbott  Pager# [phone]

## 2017-01-05 NOTE — Progress Notes (Signed)
PT Cancellation Note  Patient Details Name: Michael Acosta MRN: [id number] DOB: 1929/11/26   Cancelled Treatment:    Reason Eval/Treat Not Completed: Medical issues which prohibited therapyHigh and elevating troponin, will ck later and see how this is going later today.   Ramond Dial 01/05/2017, 11:24 AM   Mee Hives, PT MS Acute Rehab Dept. Number: [phone] and [place] [facility] [id number]

## 2017-01-05 NOTE — Clinical Social Work Note (Signed)
Clinical Social Work Assessment  Patient Details  Name: Michael Acosta MRN: [id number] Date of Birth: 01/13/1930  Date of referral:  01/05/17               Reason for consult:  Facility Placement                Permission sought to share information with:  Facility Contact Representative Permission granted to share information::  Yes, Verbal Permission Granted  Name::        Agency::  [facility]  Relationship::     Contact Information:  [phone]  Housing/Transportation Living arrangements for the past 2 months:  Skilled Nursing Facility Source of Information:  Patient, Medical Team, Spouse, Facility Patient Interpreter Needed:  None Criminal Activity/Legal Involvement Pertinent to Current Situation/Hospitalization:  No - Comment as needed Significant Relationships:  Adult Children, Spouse Lives with:  Facility Resident Do you feel safe going back to the place where you live?  Yes Need for family participation in patient care:  Yes (Comment) (Patient is only oriented to self.)  Care giving concerns: [facility] admitted from [facility]   Social Worker assessment / plan:  CSW met with the patient at bedside to discuss discharge planning. The patient was oriented to self and to the place he would be discharging back to ([facility]). The patient was unable to confirm transportation and consented to the CSW contacting his wife. The patient's wife confirmed that the patient is a LTC resident at [facility] and that EMS would be needed due to the patient's difficulty with ambulation. The patient will discharge to [facility] today, and all necessary documentation has been received by the facility. The patient will discharge to room 404A. CSW will continue to follow pending additional discharge needs.  Employment status:  Retired Insurance information:  Medicare PT Recommendations:  Not assessed at this time Information / Referral to community resources:     Patient/Family's Response to care: The patient  and his wife thanked the CSW for assistance.  Patient/Family's Understanding of and Emotional Response to Diagnosis, Current Treatment, and Prognosis:  The patient understands that he is discharging today. The patient's wife is in agreement and understands the patient's LTC needs.  Emotional Assessment Appearance:  Appears stated age Attitude/Demeanor/Rapport:   (Pleasant) Affect (typically observed):  Appropriate, Calm, Pleasant Orientation:  Oriented to Self, Oriented to Place Alcohol / Substance use:  Never Used Psych involvement (Current and /or in the community):  No (Comment)  Discharge Needs  Concerns to be addressed:  Discharge Planning Concerns, Care Coordination Readmission within the last 30 days:  Yes Current discharge risk:  None Barriers to Discharge:  No Barriers Identified    M , LCSW 01/05/2017, 11:50 AM  

## 2017-01-05 NOTE — Discharge Summary (Signed)
[place] [facility] [place] NAME: Michael Acosta    MR#:  [id number]  DATE OF BIRTH:  23-Mar-1930  DATE OF ADMISSION:  01/02/2017 ADMITTING PHYSICIAN: Lance Coon, MD  DATE OF DISCHARGE: 01/05/2017  PRIMARY CARE PHYSICIAN: Juluis Pitch, MD    ADMISSION DIAGNOSIS:  NSTEMI (non-ST elevated myocardial infarction) ([place]) [I21.4] Unresponsiveness [R41.89]  DISCHARGE DIAGNOSIS:  Principal Problem:   NSTEMI (non-ST elevated myocardial infarction) ([place]) Active Problems:   ESRD on hemodialysis ([place])   Dementia   IDDM (insulin dependent diabetes mellitus) ([place])   HTN (hypertension)   GERD (gastroesophageal reflux disease)   SECONDARY DIAGNOSIS:   Past Medical History:  Diagnosis Date  . Anemia of chronic disease   . Chronic pain disorder   . Dementia   . Depression   . E coli bacteremia    a. 09/2016.  [name] Elevated troponin    a. 09/2016 Echo: EF 60-65%, mild LVH, Gr1 DD; b. 12/2016 Echo: EF 60-65%, no rwma, Gr1 DD, mildly dil Ao root @ 3.6cm, mildly dil LA, nl RV fxn, PASP 33mmHg.  [name] ESRD on hemodialysis ([place])    a. MWF  . Falls frequently   . GERD (gastroesophageal reflux disease)   . Hypertension   . Insulin dependent diabetes mellitus with complications ([place])   . Mood disorder ([place])   . Peripheral neuropathy    a. diabetic neuropathy  . Peripheral vascular disease ([place])    a. s/p prior toe amputation; b. s/p prior L pop to prox peroneal bypass;  c. 06/2010 R BK pop to peroneal bypass; c. 04/2012 PTA of prox bypass of L pop artery.  . Prostate cancer ([place])   . Seizures ([place])    EPILEPSY  . Stroke [facility])    TIA  . Weakness generalized     HOSPITAL COURSE:   83 yo male with past medical history of end-stage renal disease on hemodialysis, chronic hypotension,seizures, history of previous CVA, peripheral neuropathy, GERD depression, dementia who presented to the hospital due to altered mental status and noted to have elevated  troponin.  1. Altered mental status-CT head on admission was negative for acute pathology. No evidence of infectious source. Patient was relatively hypotensive and therefore this contributed to the patient's mental status change. The blood pressure has improved with some fluids and Midodrine and now the pt's mental status is back to baseline.   2. Chronic Hypotension - cont. Midodrine and pt's BP has improved.   3. Elevated Trop - no acute cardiac symptoms.  As per Cardiology this was demand ischemia due to severe hypotension. Trop have trended down.  Pt. Was given heparin gtt empirically for 24 hrs but as trop trended down this was discontinued.   - Echo showed normal EF.  - ASA, Statin.     4. DM Type II w/ renal complications and dialysis - while in the hospital pt. Was on SSI and will resume his Tradjenta, Novolog insulin upon discharge.   5. Hx of Seizures - he will cont. Keppra, Lamictal - no seizures while in hospital.   6. Neuropathy - he will cont. Neurontin  7. Hyperlipidemia - he will cont. Atorvastatin  8. ESRD on HD - pt. Was seen by Nephrology and maintained on his MWF dialysis schedule and will resume that upon discharge today.   9. Secondary Hyperparathyroidism - he will cont. Renvela.   10. GERD - he will cont. His Omeprazole.   DISCHARGE CONDITIONS:   Stable.   CONSULTS  OBTAINED:  Treatment Team:  Minna Merritts, MD Anthonette Legato, MD  DRUG ALLERGIES:   Allergies  Allergen Reactions  . Aspirin Other (See Comments)    Bleeding   . Bc Powder [Aspirin-Salicylamide-Caffeine]     MAR did not specify a reaction  . Caffeine     MAR did not specify a reaction  . 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  . 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   Ramipril Other (See Comments)   Imported from [facility] - reaction: unknown      Medication List    TAKE these medications   aspirin EC 81 MG tablet Take 1 tablet (81 mg total) by mouth daily.   atorvastatin 20 MG tablet Commonly known as:  LIPITOR Take 20 mg by mouth at bedtime.   docusate sodium 100 MG capsule Commonly known as:  COLACE Take 100 mg by mouth 2 (two) times daily as needed for mild constipation.   feeding supplement (NEPRO CARB STEADY) Liqd Take 237 mLs by mouth 2 (two) times daily.   feeding supplement (PRO-STAT SUGAR FREE 64) Liqd Take 30 mLs by mouth 3 (three) times daily.   gabapentin 100 MG capsule Commonly known as:  NEURONTIN Take 100 mg by mouth 3 (three) times daily.   insulin aspart 100 UNIT/ML injection Commonly known as:  novoLOG Inject 0-10 Units into the skin See admin instructions. Three times a day  before meals. At bedtime on Sunday, Tuesday, Thursday, Saturday only. Dose per sliding scale.   lamoTRIgine 100 MG tablet Commonly known as:  LAMICTAL Take 50-100 mg by mouth 2 (two) times daily. Takes 50mg  in the AM and 100mg  at bedtime.   levETIRAcetam 500 MG tablet Commonly known as:  KEPPRA Take 500 mg by mouth daily.   linagliptin 5 MG Tabs tablet Commonly known as:  TRADJENTA Take 5 mg by mouth at bedtime.   metoCLOPramide 5 MG tablet Commonly known as:  REGLAN Take 5 mg by mouth 3 (three) times daily before meals.   midodrine 5 MG tablet Commonly known as:  PROAMATINE Take 1 tablet (5 mg total) by mouth 3 (three) times daily with meals.   multivitamin with minerals Tabs tablet Take 1 tablet by mouth daily.  omeprazole 20 MG capsule Commonly known as:  PRILOSEC Take 20 mg by mouth daily.   ondansetron 4 MG disintegrating tablet Commonly known as:  ZOFRAN-ODT Take 4 mg by mouth every 8 (eight) hours as needed for nausea or vomiting.   PHENERGAN 25 MG/ML injection Generic drug:  promethazine Inject 25 mg into the muscle every 6 (six) hours as needed for nausea or vomiting.   sevelamer carbonate 800 MG tablet Commonly known as:  RENVELA Take 800 mg by mouth 3 (three) times daily.   vitamin C 500 MG tablet Commonly known as:  ASCORBIC ACID Take 500 mg by mouth 2 (two) times daily.   zinc sulfate 220 (50 Zn) MG capsule Take 220 mg by mouth daily.            Discharge Care Instructions        Start     Ordered   01/05/17 0000  Activity as tolerated - No restrictions     01/05/17 1052   01/05/17 0000  Diet - low sodium heart healthy     01/05/17 1052   01/05/17 0000  aspirin EC 81 MG tablet  Daily     01/05/17 1053        DISCHARGE INSTRUCTIONS:   DIET:  Cardiac diet and Diabetic diet  DISCHARGE CONDITION:  Stable  ACTIVITY:  Activity as tolerated  OXYGEN:  Home Oxygen: No.   Oxygen Delivery: room air  DISCHARGE LOCATION:  nursing  home   If you experience worsening of your admission symptoms, develop shortness of breath, life threatening emergency, suicidal or homicidal thoughts you must seek medical attention immediately by calling 911 or calling your MD immediately  if symptoms less severe.  You Must read complete instructions/literature along with all the possible adverse reactions/side effects for all the Medicines you take and that have been prescribed to you. Take any new Medicines after you have completely understood and accpet all the possible adverse reactions/side effects.   Please note  You were cared for by a hospitalist during your hospital stay. If you have any questions about your discharge medications or the care you received while you were in the hospital after you are discharged, you can call the unit and asked to speak with the hospitalist on call if the hospitalist that took care of you is not available. Once you are discharged, your primary care physician will handle any further medical issues. Please note that NO REFILLS for any discharge medications will be authorized once you are discharged, as it is imperative that you return to your primary care physician (or establish a relationship with a primary care physician if you do not have one) for your aftercare needs so that they can reassess your need for medications and monitor your lab values.     Today   BP much improved.  Mental status back to baseline.  No evidence of sepsis. Will d/c to SNF today.   VITAL SIGNS:  Blood pressure (!) 154/97, pulse 77, temperature 98 F (36.7 C), temperature source Oral, resp. rate 20, height 5\' 11"  (1.803 m), weight 80.7 kg (178 lb), SpO2 97 %.  I/O:   Intake/Output Summary (Last 24 hours) at 01/05/17 1056 Last data filed at 01/05/17 0046  Gross per 24 hour  Intake             34.9 ml  Output                0 ml  Net  34.9 ml    PHYSICAL EXAMINATION:   GENERAL:  83 y.o.-year-old patient  lying in bed in no acute distress.  EYES: Pupils equal, round, reactive to light and accommodation. No scleral icterus. Extraocular muscles intact.  HEENT: Head atraumatic, normocephalic. Oropharynx and nasopharynx clear.  NECK:  Supple, no jugular venous distention. No thyroid enlargement, no tenderness.  LUNGS: Normal breath sounds bilaterally, no wheezing, rales, rhonchi. No use of accessory muscles of respiration.  CARDIOVASCULAR: S1, S2 normal. No murmurs, rubs, or gallops.  ABDOMEN: Soft, nontender, nondistended. Bowel sounds present. No organomegaly or mass.  EXTREMITIES: No cyanosis, clubbing or edema b/l.    NEUROLOGIC: Cranial nerves II through XII are intact. No focal Motor or sensory deficits b/l.  Globally weak PSYCHIATRIC: The patient is alert and oriented x 3.  SKIN: No obvious rash, lesion, or ulcer.   Left upper ext. AV fistula with good bruit/thrill.   DATA REVIEW:   CBC  Recent Labs Lab 01/05/17 0732  WBC 7.4  HGB 12.2*  HCT 36.8*  PLT 142*    Chemistries   Recent Labs Lab 01/02/17 2135 01/03/17 0931  NA 136 139  K 4.0 4.2  CL 96* 97*  CO2 23 25  GLUCOSE 192* 114*  BUN 80* 88*  CREATININE 7.79* 8.60*  CALCIUM 8.9 9.0  AST 27  --   ALT 25  --   ALKPHOS 123  --   BILITOT 0.8  --     Cardiac Enzymes  Recent Labs Lab 01/05/17 0732  TROPONINI 1.97*    Microbiology Results  Results for orders placed or performed during the hospital encounter of 01/02/17  MRSA PCR Screening     Status: Abnormal   Collection Time: 01/03/17  1:00 AM  Result Value Ref Range Status   MRSA by PCR POSITIVE (A) NEGATIVE Final    Comment:        The GeneXpert MRSA Assay (FDA approved for NASAL specimens only), is one component of a comprehensive MRSA colonization surveillance program. It is not intended to diagnose MRSA infection nor to guide or monitor treatment for MRSA infections. RESULT CALLED TO, READ BACK BY AND VERIFIED WITH: MARCEL TURNER @ 0227 ON  01/03/2017 BY CAF     RADIOLOGY:  No results found.    Management plans discussed with the patient, family and they are in agreement.  CODE STATUS:     Code Status Orders        Start     Ordered   01/03/17 0048  Do not attempt resuscitation (DNR)  Continuous    Question Answer Comment  In the event of cardiac or respiratory ARREST Do not call a "code blue"   In the event of cardiac or respiratory ARREST Do not perform Intubation, CPR, defibrillation or ACLS   In the event of cardiac or respiratory ARREST Use medication by any route, position, wound care, and other measures to relive pain and suffering. May use oxygen, suction and manual treatment of airway obstruction as needed for comfort.      01/03/17 0047      Advance Directive Documentation     Most Recent Value  Type of Advance Directive  Out of facility DNR (pink MOST or yellow form)  Pre-existing out of facility DNR order (yellow form or pink MOST form)  Physician notified to receive inpatient order  "MOST" Form in Place?  -      TOTAL TIME TAKING CARE OF THIS PATIENT: 40 minutes.    [place]  J M.D on 01/05/2017 at 10:56 AM  Between 7am to 6pm - Pager - [phone]  After 6pm go to [email or web address] - Proofreader  [facility] [place] [facility]  Office  [phone]  CC: Primary care physician; Juluis Pitch, MD

## 2017-01-16 ENCOUNTER — Encounter (INDEPENDENT_AMBULATORY_CARE_PROVIDER_SITE_OTHER): Payer: Medicare Other

## 2017-01-16 ENCOUNTER — Ambulatory Visit (INDEPENDENT_AMBULATORY_CARE_PROVIDER_SITE_OTHER): Payer: BLUE CROSS/BLUE SHIELD | Admitting: Vascular Surgery

## 2017-02-06 ENCOUNTER — Encounter (INDEPENDENT_AMBULATORY_CARE_PROVIDER_SITE_OTHER): Payer: Self-pay

## 2017-02-06 ENCOUNTER — Ambulatory Visit (INDEPENDENT_AMBULATORY_CARE_PROVIDER_SITE_OTHER): Payer: Self-pay | Admitting: Vascular Surgery

## 2017-02-13 DEATH — deceased

## 2018-01-21 IMAGING — DX DG ABDOMEN 1V
2 series · 2 of 2 positions shown · non-contrast
Comparison: Radiographs 07/26/2014

CLINICAL DATA: Femoral central line placement.

EXAM:
ABDOMEN - 1 VIEW

[abdomen kub (1 of 2)]
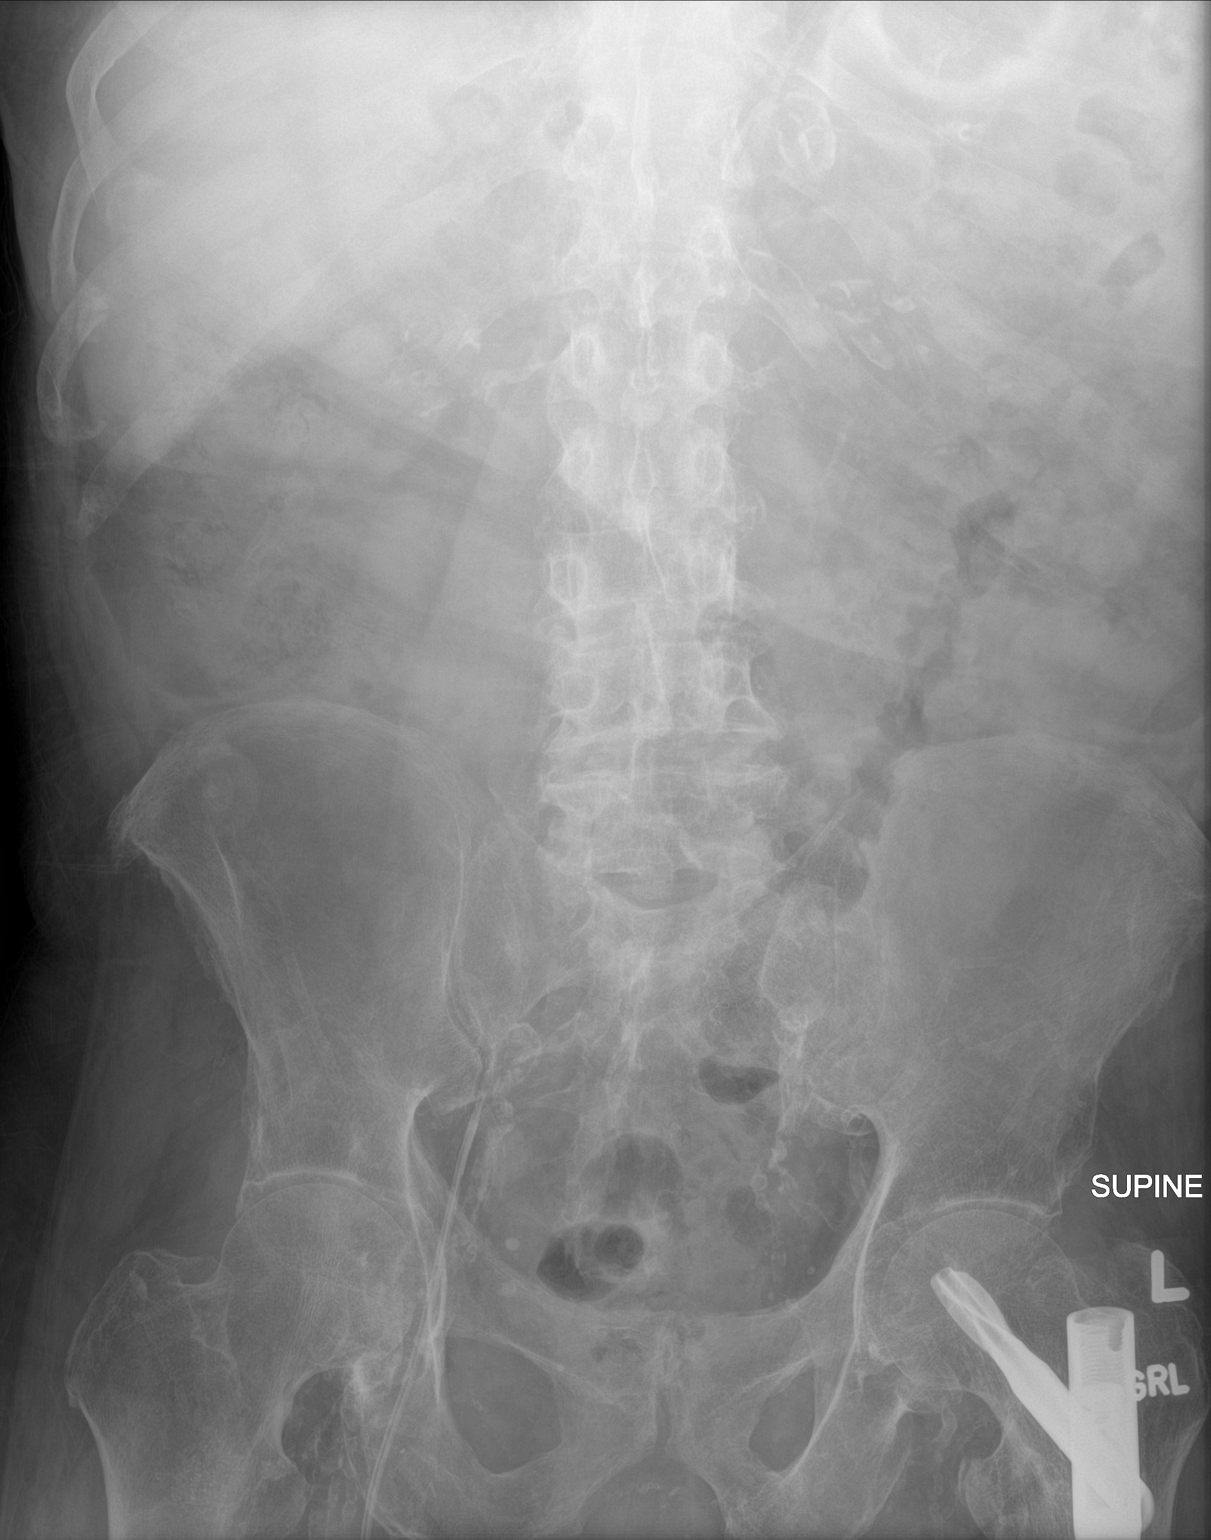

[abdomen kub (2 of 2)]
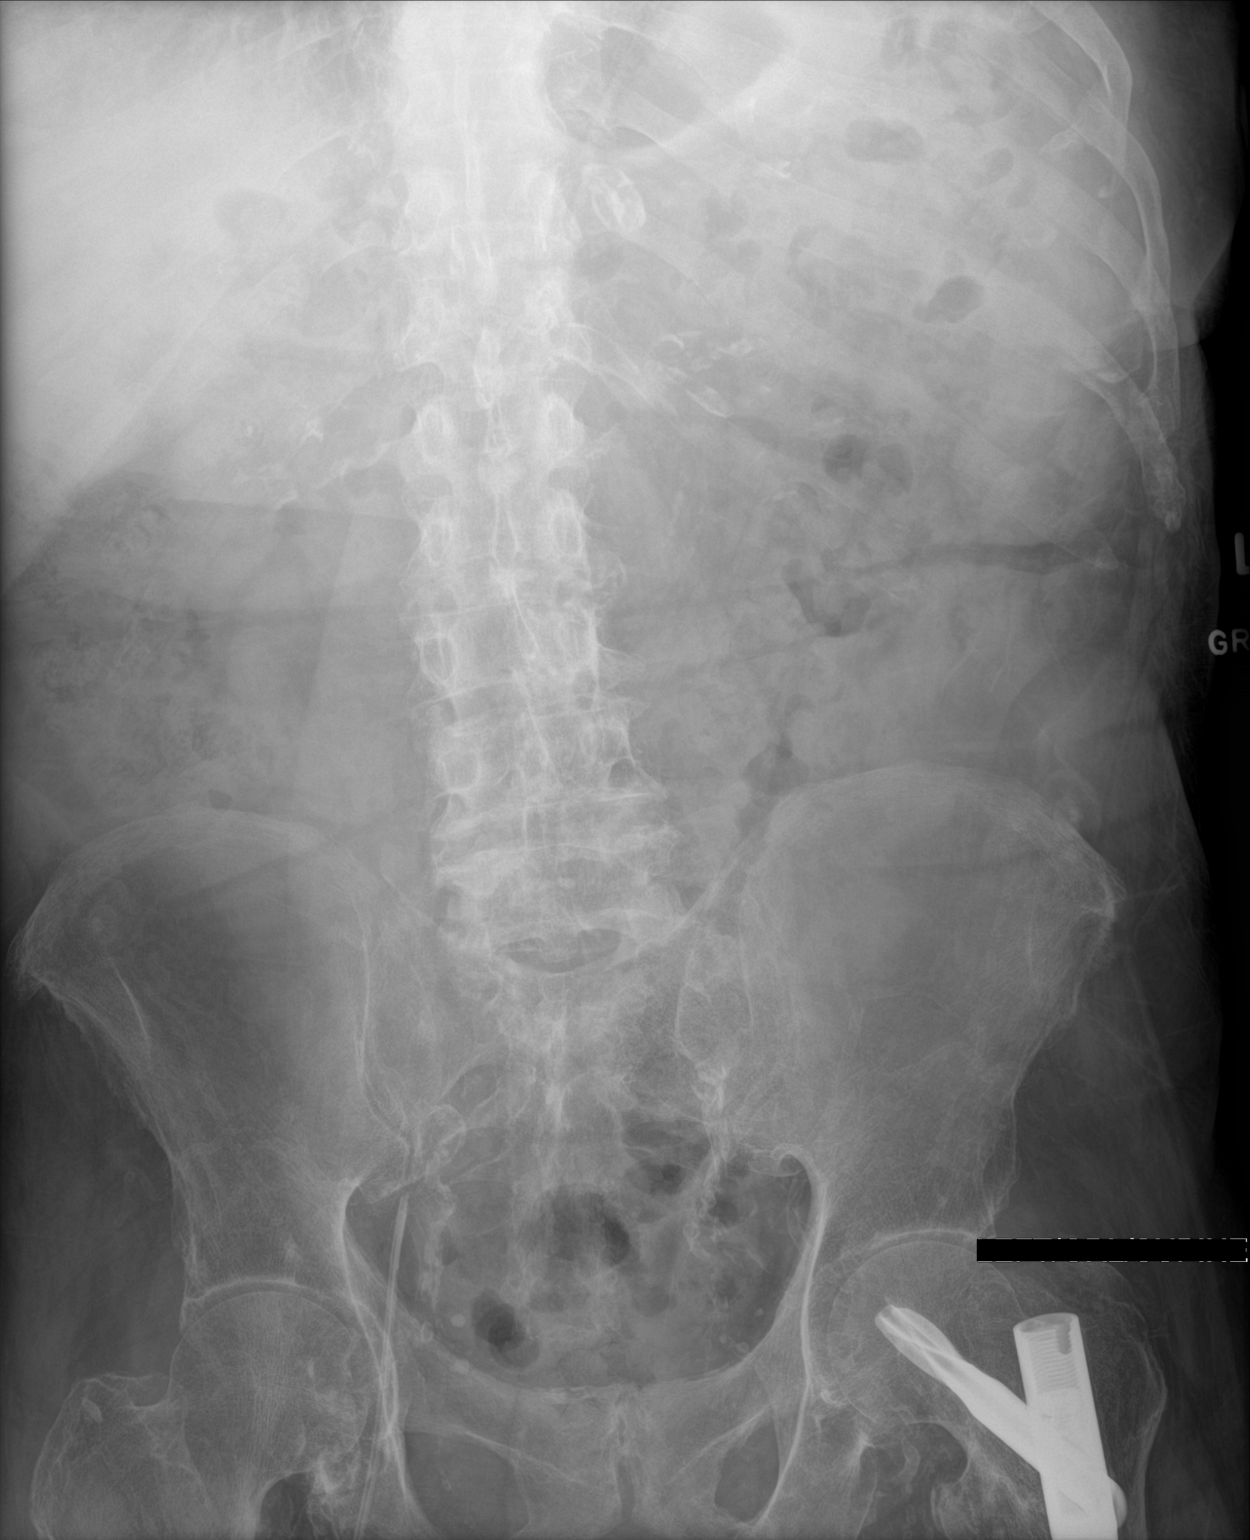

[2 of 2 positions shown; findings below may reference images not displayed]

FINDINGS: Right femoral catheter tip projects over the region of the right
external/common iliac vein. Question submucosal edema involving the
descending colon. No dilated bowel loops. Extensive vascular
calcifications. No evidence of free air on single view.
IMPRESSION: Tip of the right femoral catheter projects over the region of the
right external/common iliac vein.

Question submucosal edema about the descending colon, recommend
correlation for colitis symptoms.

## 2018-08-26 IMAGING — CT CT HEAD W/O CM
3 series · 15 of 47 positions shown, 18 images · non-contrast
Comparison: 04/25/2013

CLINICAL DATA: Altered level of consciousness.

EXAM:
CT HEAD WITHOUT CONTRAST
TECHNIQUE: Contiguous axial images were obtained from the base of the skull
through the vertex without intravenous contrast.

[Series 3: ax head wo · axial · 0.43mm/px · z∈[-15,+108]mm · 9 of 31 slices shown, 12 images]
[im 3/31  brain]
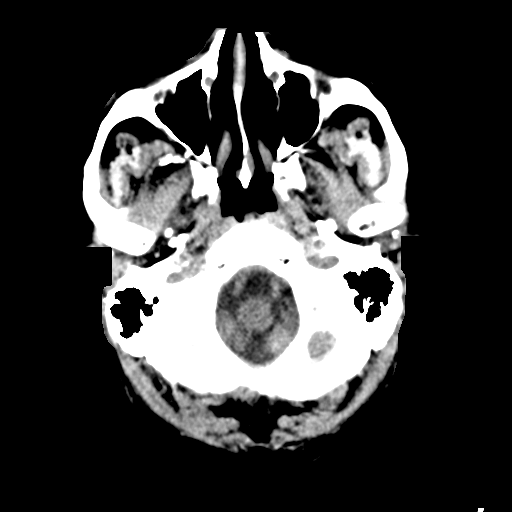
[im 3/31  bone]
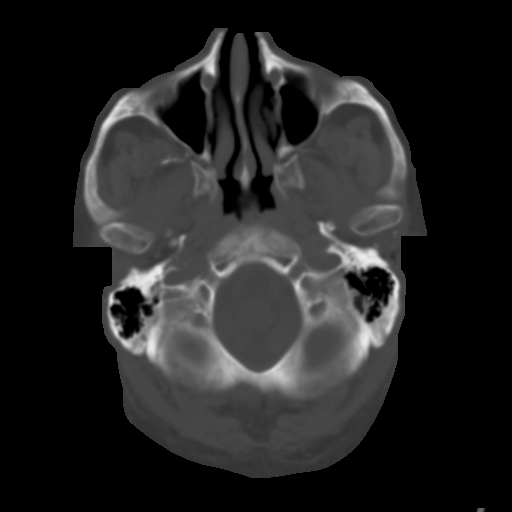
[im 6/31  brain]
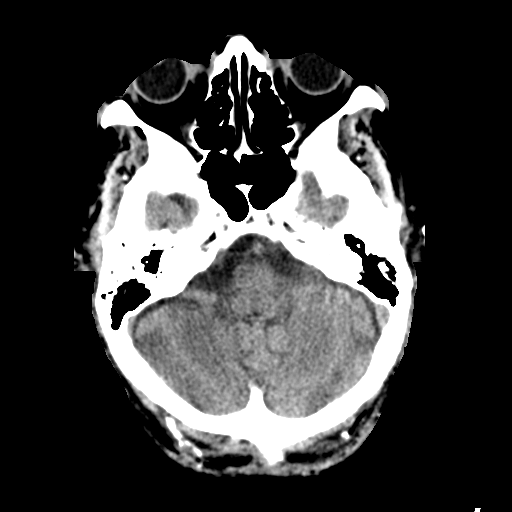
[im 9/31  brain]
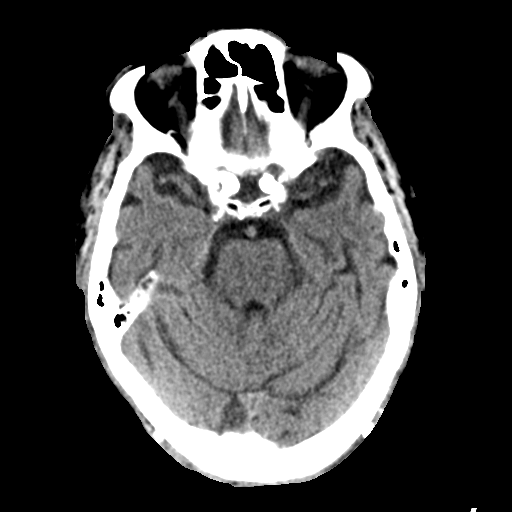
[im 12/31  brain]
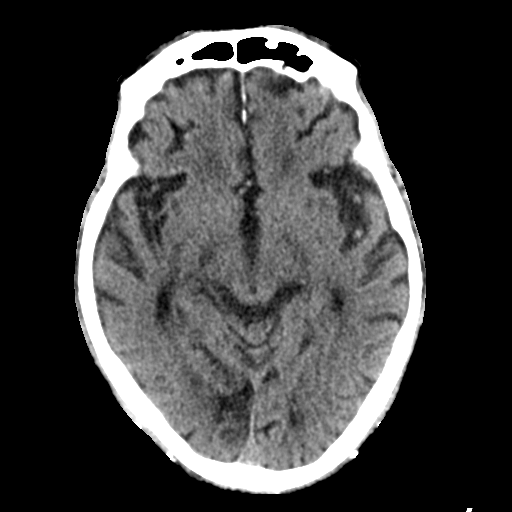
[im 16/31  brain]
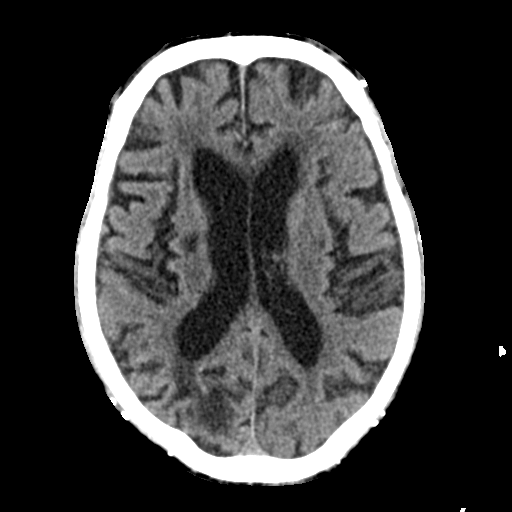
[im 16/31  bone]
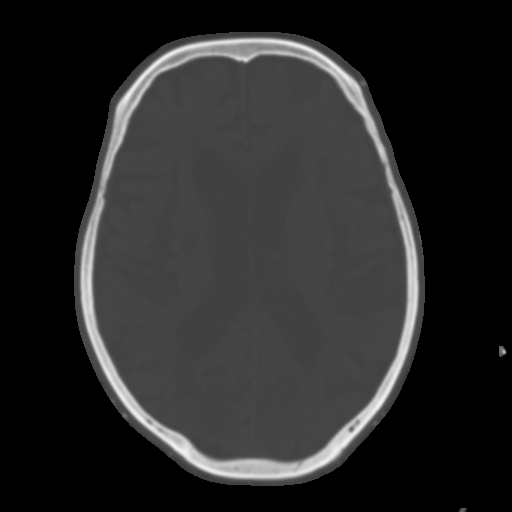
[im 19/31  brain]
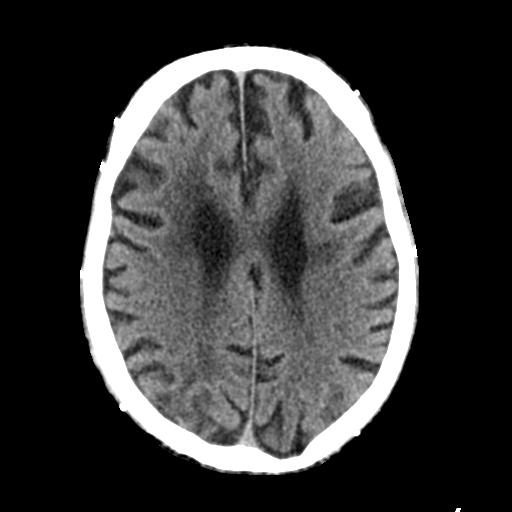
[im 22/31  brain]
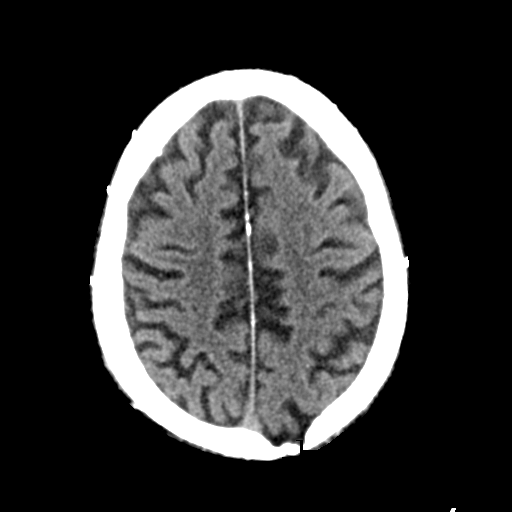
[im 25/31  brain]
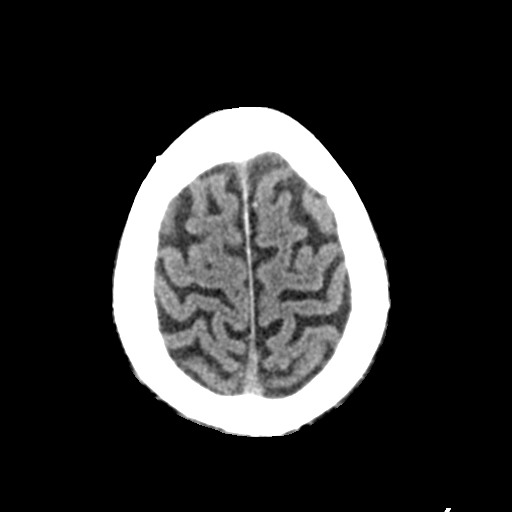
[im 28/31  brain]
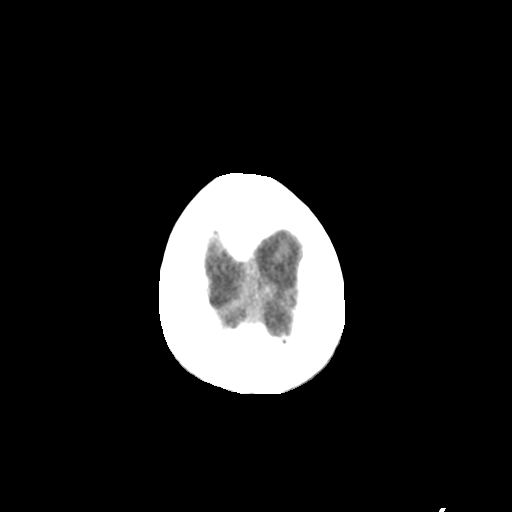
[im 28/31  bone]
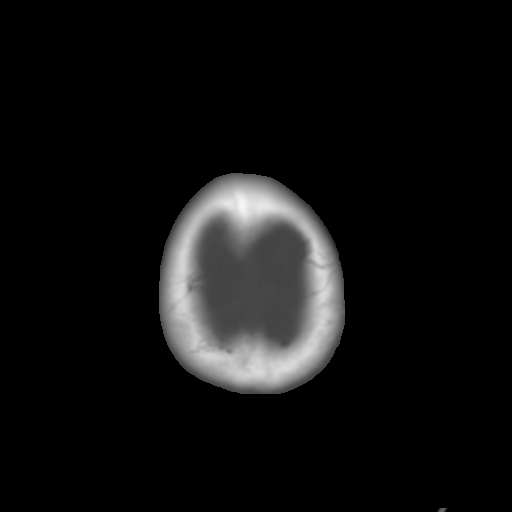

[Series 5: coronal soft tissue · coronal · 0.31mm/px · 3 of 71 slices shown]
[im 24/71  brain]
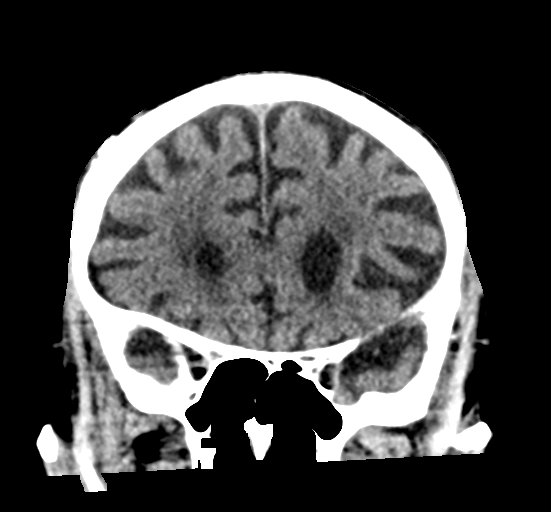
[im 32/71  brain]
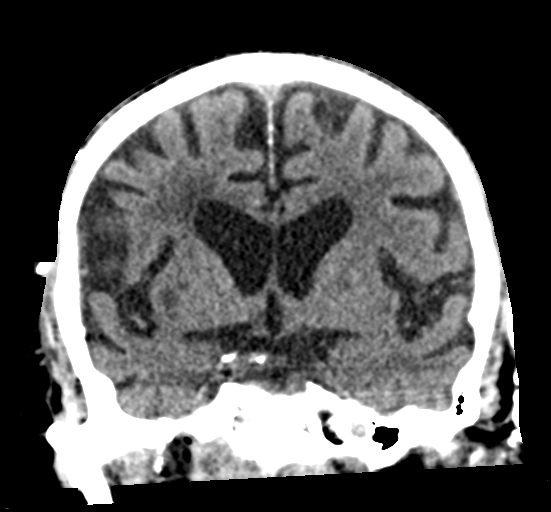
[im 39/71  brain]
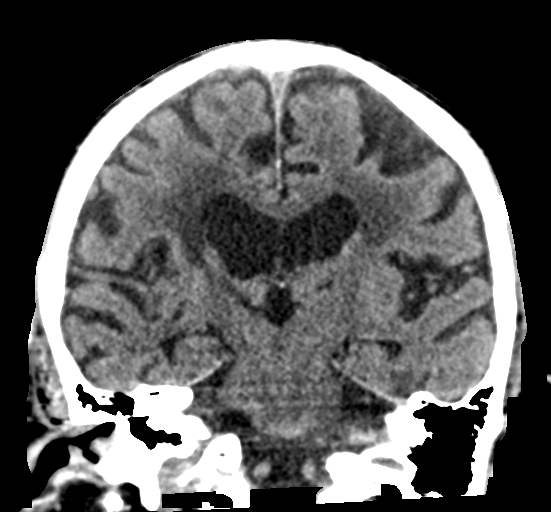

[Series 6: sagittal soft tissue · sagittal · 0.33mm/px · 3 of 56 slices shown]
[im 19/56  brain]
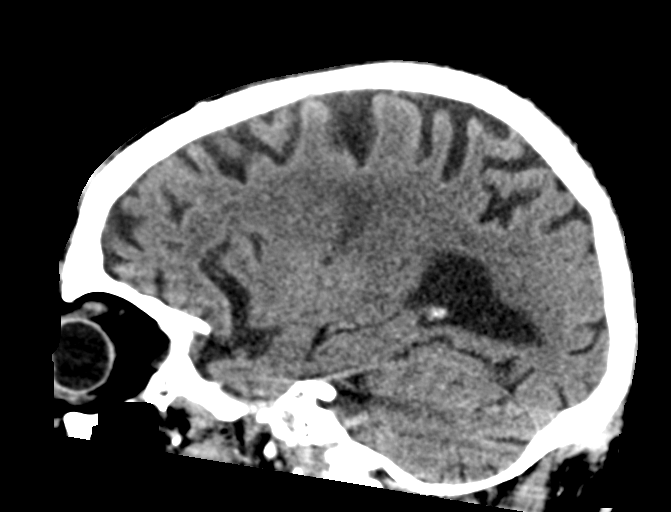
[im 28/56  brain]
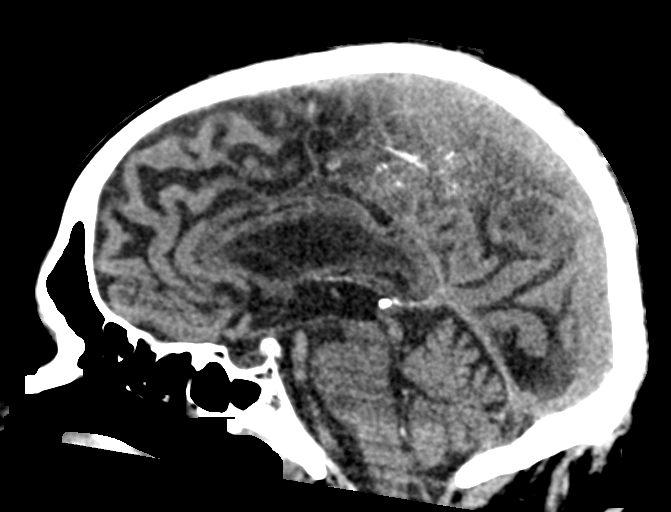
[im 37/56  brain]
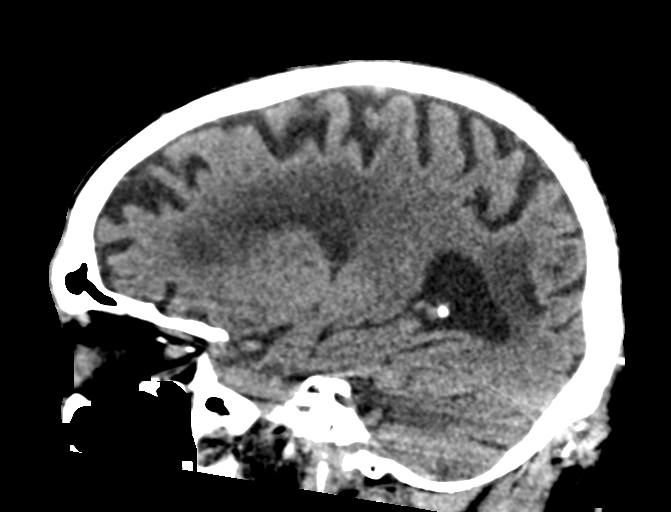

[15 of 47 positions shown; findings below may reference images not displayed]

FINDINGS: Brain: There is atrophy and chronic small vessel disease changes.
Bilateral basal ganglia and thalamic lacunar infarcts, stable. Old
right occipital infarct. No acute intracranial abnormality.
Specifically, no hemorrhage, hydrocephalus, mass lesion, acute
infarction, or significant intracranial injury.

Vascular: No hyperdense vessel or unexpected calcification.

Skull: No acute calvarial abnormality.

Sinuses/Orbits: Visualized paranasal sinuses and mastoids clear.
Orbital soft tissues unremarkable.

Other: None
IMPRESSION: No acute intracranial abnormality.

Atrophy, chronic microvascular disease.

Old bilateral basal ganglia and thalamic lacunar infarcts. Old right
occipital infarct.
